# Patient Record
Sex: Female | Born: 1960 | Race: Black or African American | Hispanic: No | Marital: Single | Smoking: Current some day smoker
Health system: Southern US, Community
[De-identification: ages and names within clinical notes are randomized; demographics above are authoritative.]

## PROBLEM LIST (undated history)

## (undated) DIAGNOSIS — K219 Gastro-esophageal reflux disease without esophagitis: Secondary | ICD-10-CM

## (undated) DIAGNOSIS — B182 Chronic viral hepatitis C: Secondary | ICD-10-CM

## (undated) DIAGNOSIS — M1990 Unspecified osteoarthritis, unspecified site: Secondary | ICD-10-CM

## (undated) DIAGNOSIS — B1910 Unspecified viral hepatitis B without hepatic coma: Secondary | ICD-10-CM

## (undated) DIAGNOSIS — K029 Dental caries, unspecified: Secondary | ICD-10-CM

## (undated) DIAGNOSIS — R7611 Nonspecific reaction to tuberculin skin test without active tuberculosis: Secondary | ICD-10-CM

## (undated) DIAGNOSIS — I10 Essential (primary) hypertension: Secondary | ICD-10-CM

## (undated) DIAGNOSIS — L509 Urticaria, unspecified: Secondary | ICD-10-CM

## (undated) DIAGNOSIS — M2140 Flat foot [pes planus] (acquired), unspecified foot: Secondary | ICD-10-CM

## (undated) DIAGNOSIS — F1910 Other psychoactive substance abuse, uncomplicated: Secondary | ICD-10-CM

## (undated) DIAGNOSIS — Z720 Tobacco use: Secondary | ICD-10-CM

## (undated) DIAGNOSIS — M5127 Other intervertebral disc displacement, lumbosacral region: Secondary | ICD-10-CM

## (undated) DIAGNOSIS — R740 Nonspecific elevation of levels of transaminase and lactic acid dehydrogenase [LDH]: Secondary | ICD-10-CM

## (undated) DIAGNOSIS — A159 Respiratory tuberculosis unspecified: Secondary | ICD-10-CM

## (undated) DIAGNOSIS — Z8619 Personal history of other infectious and parasitic diseases: Secondary | ICD-10-CM

## (undated) DIAGNOSIS — N6029 Fibroadenosis of unspecified breast: Secondary | ICD-10-CM

## (undated) HISTORY — DX: Urticaria, unspecified: L50.9

## (undated) HISTORY — DX: Unspecified viral hepatitis B without hepatic coma: B19.10

## (undated) HISTORY — PX: BREAST EXCISIONAL BIOPSY: SUR124

## (undated) HISTORY — DX: Fibroadenosis of unspecified breast: N60.29

## (undated) HISTORY — DX: Gastro-esophageal reflux disease without esophagitis: K21.9

## (undated) HISTORY — DX: Nonspecific reaction to tuberculin skin test without active tuberculosis: R76.11

## (undated) HISTORY — DX: Chronic viral hepatitis C: B18.2

## (undated) HISTORY — DX: Flat foot (pes planus) (acquired), unspecified foot: M21.40

## (undated) HISTORY — DX: Nonspecific elevation of levels of transaminase and lactic acid dehydrogenase (ldh): R74.0

## (undated) HISTORY — DX: Essential (primary) hypertension: I10

## (undated) HISTORY — PX: FRACTURE SURGERY: SHX138

## (undated) HISTORY — DX: Other intervertebral disc displacement, lumbosacral region: M51.27

## (undated) HISTORY — PX: COLONOSCOPY: SHX174

## (undated) HISTORY — DX: Dental caries, unspecified: K02.9

## (undated) HISTORY — DX: Tobacco use: Z72.0

## (undated) HISTORY — DX: Unspecified osteoarthritis, unspecified site: M19.90

## (undated) HISTORY — DX: Other psychoactive substance abuse, uncomplicated: F19.10

## (undated) HISTORY — DX: Personal history of other infectious and parasitic diseases: Z86.19

---

## 1998-05-08 ENCOUNTER — Emergency Department (HOSPITAL_COMMUNITY): Admission: EM | Admit: 1998-05-08 | Discharge: 1998-05-08 | Payer: Self-pay | Admitting: Emergency Medicine

## 1998-08-08 HISTORY — PX: VAGINAL HYSTERECTOMY: SUR661

## 2000-08-12 ENCOUNTER — Emergency Department (HOSPITAL_COMMUNITY): Admission: EM | Admit: 2000-08-12 | Discharge: 2000-08-12 | Payer: Self-pay | Admitting: Emergency Medicine

## 2000-08-15 ENCOUNTER — Encounter: Admission: RE | Admit: 2000-08-15 | Discharge: 2000-08-15 | Payer: Self-pay | Admitting: Hematology and Oncology

## 2000-08-29 ENCOUNTER — Encounter: Admission: RE | Admit: 2000-08-29 | Discharge: 2000-08-29 | Payer: Self-pay | Admitting: Internal Medicine

## 2000-10-23 ENCOUNTER — Encounter: Admission: RE | Admit: 2000-10-23 | Discharge: 2000-10-23 | Payer: Self-pay

## 2000-10-24 ENCOUNTER — Encounter: Payer: Self-pay | Admitting: *Deleted

## 2000-10-24 ENCOUNTER — Ambulatory Visit (HOSPITAL_COMMUNITY): Admission: RE | Admit: 2000-10-24 | Discharge: 2000-10-24 | Payer: Self-pay | Admitting: *Deleted

## 2000-11-06 DIAGNOSIS — R7401 Elevation of levels of liver transaminase levels: Secondary | ICD-10-CM

## 2000-11-06 DIAGNOSIS — B1910 Unspecified viral hepatitis B without hepatic coma: Secondary | ICD-10-CM

## 2000-11-06 HISTORY — DX: Unspecified viral hepatitis B without hepatic coma: B19.10

## 2000-11-06 HISTORY — DX: Elevation of levels of liver transaminase levels: R74.01

## 2000-11-30 ENCOUNTER — Encounter: Admission: RE | Admit: 2000-11-30 | Discharge: 2000-11-30 | Payer: Self-pay | Admitting: Hematology and Oncology

## 2000-11-30 ENCOUNTER — Ambulatory Visit (HOSPITAL_COMMUNITY): Admission: RE | Admit: 2000-11-30 | Discharge: 2000-11-30 | Payer: Self-pay | Admitting: Hematology and Oncology

## 2000-11-30 ENCOUNTER — Encounter: Payer: Self-pay | Admitting: *Deleted

## 2000-11-30 ENCOUNTER — Ambulatory Visit (HOSPITAL_COMMUNITY): Admission: RE | Admit: 2000-11-30 | Discharge: 2000-11-30 | Payer: Self-pay | Admitting: *Deleted

## 2000-12-07 ENCOUNTER — Encounter: Admission: RE | Admit: 2000-12-07 | Discharge: 2000-12-07 | Payer: Self-pay | Admitting: Internal Medicine

## 2000-12-18 ENCOUNTER — Encounter: Admission: RE | Admit: 2000-12-18 | Discharge: 2000-12-18 | Payer: Self-pay | Admitting: Internal Medicine

## 2001-01-11 ENCOUNTER — Ambulatory Visit (HOSPITAL_COMMUNITY): Admission: RE | Admit: 2001-01-11 | Discharge: 2001-01-12 | Payer: Self-pay | Admitting: *Deleted

## 2001-01-11 ENCOUNTER — Encounter: Payer: Self-pay | Admitting: *Deleted

## 2001-01-11 HISTORY — PX: SPINE SURGERY: SHX786

## 2001-02-20 ENCOUNTER — Encounter: Admission: RE | Admit: 2001-02-20 | Discharge: 2001-05-21 | Payer: Self-pay | Admitting: *Deleted

## 2001-03-21 ENCOUNTER — Encounter: Admission: RE | Admit: 2001-03-21 | Discharge: 2001-03-21 | Payer: Self-pay | Admitting: Internal Medicine

## 2001-04-17 ENCOUNTER — Encounter: Admission: RE | Admit: 2001-04-17 | Discharge: 2001-04-17 | Payer: Self-pay | Admitting: Internal Medicine

## 2001-06-04 ENCOUNTER — Encounter: Admission: RE | Admit: 2001-06-04 | Discharge: 2001-06-04 | Payer: Self-pay

## 2001-07-24 ENCOUNTER — Encounter: Admission: RE | Admit: 2001-07-24 | Discharge: 2001-10-01 | Payer: Self-pay | Admitting: *Deleted

## 2001-10-29 ENCOUNTER — Encounter: Admission: RE | Admit: 2001-10-29 | Discharge: 2001-10-29 | Payer: Self-pay | Admitting: Internal Medicine

## 2002-01-15 ENCOUNTER — Encounter (INDEPENDENT_AMBULATORY_CARE_PROVIDER_SITE_OTHER): Payer: Self-pay | Admitting: Pulmonary Disease

## 2002-01-15 ENCOUNTER — Other Ambulatory Visit: Admission: RE | Admit: 2002-01-15 | Discharge: 2002-01-15 | Payer: Self-pay | Admitting: Internal Medicine

## 2002-01-15 ENCOUNTER — Encounter: Admission: RE | Admit: 2002-01-15 | Discharge: 2002-01-15 | Payer: Self-pay | Admitting: *Deleted

## 2002-01-15 LAB — CONVERTED CEMR LAB: Pap Smear: NORMAL

## 2002-01-22 ENCOUNTER — Encounter: Payer: Self-pay | Admitting: *Deleted

## 2002-01-22 ENCOUNTER — Encounter: Admission: RE | Admit: 2002-01-22 | Discharge: 2002-01-22 | Payer: Self-pay | Admitting: *Deleted

## 2002-02-16 ENCOUNTER — Emergency Department (HOSPITAL_COMMUNITY): Admission: EM | Admit: 2002-02-16 | Discharge: 2002-02-16 | Payer: Self-pay | Admitting: Emergency Medicine

## 2002-03-05 ENCOUNTER — Encounter: Admission: RE | Admit: 2002-03-05 | Discharge: 2002-03-05 | Payer: Self-pay | Admitting: Internal Medicine

## 2002-05-23 ENCOUNTER — Encounter: Admission: RE | Admit: 2002-05-23 | Discharge: 2002-05-23 | Payer: Self-pay | Admitting: Internal Medicine

## 2002-05-27 ENCOUNTER — Encounter: Admission: RE | Admit: 2002-05-27 | Discharge: 2002-05-27 | Payer: Self-pay | Admitting: Internal Medicine

## 2003-01-08 ENCOUNTER — Encounter: Payer: Self-pay | Admitting: *Deleted

## 2003-01-08 ENCOUNTER — Encounter: Admission: RE | Admit: 2003-01-08 | Discharge: 2003-01-08 | Payer: Self-pay | Admitting: *Deleted

## 2003-02-17 ENCOUNTER — Encounter: Admission: RE | Admit: 2003-02-17 | Discharge: 2003-02-17 | Payer: Self-pay | Admitting: *Deleted

## 2003-02-17 ENCOUNTER — Encounter: Payer: Self-pay | Admitting: *Deleted

## 2003-05-02 ENCOUNTER — Encounter: Admission: RE | Admit: 2003-05-02 | Discharge: 2003-05-02 | Payer: Self-pay | Admitting: Internal Medicine

## 2003-05-29 ENCOUNTER — Ambulatory Visit (HOSPITAL_COMMUNITY): Admission: RE | Admit: 2003-05-29 | Discharge: 2003-05-29 | Payer: Self-pay | Admitting: Internal Medicine

## 2003-05-29 ENCOUNTER — Encounter: Payer: Self-pay | Admitting: Internal Medicine

## 2003-10-16 ENCOUNTER — Encounter: Admission: RE | Admit: 2003-10-16 | Discharge: 2003-10-16 | Payer: Self-pay | Admitting: Internal Medicine

## 2004-05-10 ENCOUNTER — Ambulatory Visit (HOSPITAL_COMMUNITY): Admission: RE | Admit: 2004-05-10 | Discharge: 2004-05-10 | Payer: Self-pay | Admitting: Gastroenterology

## 2004-05-10 ENCOUNTER — Encounter (INDEPENDENT_AMBULATORY_CARE_PROVIDER_SITE_OTHER): Payer: Self-pay | Admitting: *Deleted

## 2004-06-16 ENCOUNTER — Ambulatory Visit (HOSPITAL_COMMUNITY): Admission: RE | Admit: 2004-06-16 | Discharge: 2004-06-16 | Payer: Self-pay | Admitting: Internal Medicine

## 2004-09-09 ENCOUNTER — Ambulatory Visit: Payer: Self-pay | Admitting: Gastroenterology

## 2004-09-14 ENCOUNTER — Ambulatory Visit: Payer: Self-pay | Admitting: Internal Medicine

## 2004-09-22 ENCOUNTER — Ambulatory Visit: Payer: Self-pay | Admitting: Sports Medicine

## 2004-11-09 ENCOUNTER — Ambulatory Visit: Payer: Self-pay | Admitting: Sports Medicine

## 2004-12-07 ENCOUNTER — Ambulatory Visit: Payer: Self-pay | Admitting: Sports Medicine

## 2004-12-27 ENCOUNTER — Emergency Department (HOSPITAL_COMMUNITY): Admission: EM | Admit: 2004-12-27 | Discharge: 2004-12-27 | Payer: Self-pay | Admitting: Family Medicine

## 2004-12-30 ENCOUNTER — Emergency Department (HOSPITAL_COMMUNITY): Admission: EM | Admit: 2004-12-30 | Discharge: 2004-12-30 | Payer: Self-pay | Admitting: Family Medicine

## 2005-01-04 ENCOUNTER — Ambulatory Visit: Payer: Self-pay | Admitting: Sports Medicine

## 2005-02-03 ENCOUNTER — Emergency Department (HOSPITAL_COMMUNITY): Admission: EM | Admit: 2005-02-03 | Discharge: 2005-02-04 | Payer: Self-pay | Admitting: Emergency Medicine

## 2005-02-11 ENCOUNTER — Ambulatory Visit: Payer: Self-pay | Admitting: Sports Medicine

## 2005-04-06 ENCOUNTER — Ambulatory Visit: Payer: Self-pay | Admitting: Internal Medicine

## 2005-04-06 ENCOUNTER — Encounter (INDEPENDENT_AMBULATORY_CARE_PROVIDER_SITE_OTHER): Payer: Self-pay | Admitting: Pulmonary Disease

## 2005-04-07 ENCOUNTER — Encounter (INDEPENDENT_AMBULATORY_CARE_PROVIDER_SITE_OTHER): Payer: Self-pay | Admitting: Pulmonary Disease

## 2005-04-14 ENCOUNTER — Ambulatory Visit: Payer: Self-pay | Admitting: Internal Medicine

## 2005-06-09 ENCOUNTER — Emergency Department (HOSPITAL_COMMUNITY): Admission: EM | Admit: 2005-06-09 | Discharge: 2005-06-09 | Payer: Self-pay | Admitting: Emergency Medicine

## 2005-08-30 ENCOUNTER — Ambulatory Visit: Payer: Self-pay | Admitting: Sports Medicine

## 2006-01-07 ENCOUNTER — Emergency Department (HOSPITAL_COMMUNITY): Admission: EM | Admit: 2006-01-07 | Discharge: 2006-01-07 | Payer: Self-pay | Admitting: Family Medicine

## 2006-02-14 ENCOUNTER — Ambulatory Visit: Payer: Self-pay | Admitting: Sports Medicine

## 2006-02-19 ENCOUNTER — Emergency Department (HOSPITAL_COMMUNITY): Admission: EM | Admit: 2006-02-19 | Discharge: 2006-02-19 | Payer: Self-pay | Admitting: Emergency Medicine

## 2006-02-27 ENCOUNTER — Emergency Department (HOSPITAL_COMMUNITY): Admission: EM | Admit: 2006-02-27 | Discharge: 2006-02-27 | Payer: Self-pay | Admitting: Emergency Medicine

## 2006-03-01 ENCOUNTER — Ambulatory Visit: Payer: Self-pay | Admitting: Hospitalist

## 2006-03-02 ENCOUNTER — Ambulatory Visit (HOSPITAL_COMMUNITY): Admission: RE | Admit: 2006-03-02 | Discharge: 2006-03-02 | Payer: Self-pay | Admitting: Internal Medicine

## 2006-03-17 ENCOUNTER — Ambulatory Visit: Payer: Self-pay | Admitting: Sports Medicine

## 2006-04-03 ENCOUNTER — Ambulatory Visit: Payer: Self-pay | Admitting: Internal Medicine

## 2006-04-08 ENCOUNTER — Emergency Department (HOSPITAL_COMMUNITY): Admission: EM | Admit: 2006-04-08 | Discharge: 2006-04-09 | Payer: Self-pay | Admitting: Emergency Medicine

## 2006-04-14 ENCOUNTER — Encounter: Admission: RE | Admit: 2006-04-14 | Discharge: 2006-05-04 | Payer: Self-pay | Admitting: Pulmonary Disease

## 2006-04-28 ENCOUNTER — Ambulatory Visit: Payer: Self-pay | Admitting: Sports Medicine

## 2006-05-11 ENCOUNTER — Ambulatory Visit: Payer: Self-pay | Admitting: Internal Medicine

## 2006-05-11 ENCOUNTER — Encounter (INDEPENDENT_AMBULATORY_CARE_PROVIDER_SITE_OTHER): Payer: Self-pay | Admitting: Pulmonary Disease

## 2006-05-12 ENCOUNTER — Encounter (INDEPENDENT_AMBULATORY_CARE_PROVIDER_SITE_OTHER): Payer: Self-pay | Admitting: Pulmonary Disease

## 2006-05-12 ENCOUNTER — Ambulatory Visit (HOSPITAL_COMMUNITY): Admission: RE | Admit: 2006-05-12 | Discharge: 2006-05-12 | Payer: Self-pay | Admitting: Internal Medicine

## 2006-05-15 ENCOUNTER — Encounter (INDEPENDENT_AMBULATORY_CARE_PROVIDER_SITE_OTHER): Payer: Self-pay | Admitting: Pulmonary Disease

## 2006-05-15 DIAGNOSIS — I10 Essential (primary) hypertension: Secondary | ICD-10-CM | POA: Insufficient documentation

## 2006-06-12 ENCOUNTER — Emergency Department (HOSPITAL_COMMUNITY): Admission: EM | Admit: 2006-06-12 | Discharge: 2006-06-12 | Payer: Self-pay | Admitting: Emergency Medicine

## 2006-08-16 ENCOUNTER — Ambulatory Visit: Payer: Self-pay | Admitting: Sports Medicine

## 2006-08-25 DIAGNOSIS — N6029 Fibroadenosis of unspecified breast: Secondary | ICD-10-CM | POA: Insufficient documentation

## 2006-08-25 DIAGNOSIS — B182 Chronic viral hepatitis C: Secondary | ICD-10-CM

## 2006-08-25 DIAGNOSIS — Z8619 Personal history of other infectious and parasitic diseases: Secondary | ICD-10-CM | POA: Insufficient documentation

## 2006-08-25 DIAGNOSIS — Z720 Tobacco use: Secondary | ICD-10-CM | POA: Insufficient documentation

## 2006-08-25 HISTORY — DX: Personal history of other infectious and parasitic diseases: Z86.19

## 2006-08-25 HISTORY — DX: Chronic viral hepatitis C: B18.2

## 2006-08-28 ENCOUNTER — Telehealth: Payer: Self-pay | Admitting: Internal Medicine

## 2006-08-29 ENCOUNTER — Telehealth (INDEPENDENT_AMBULATORY_CARE_PROVIDER_SITE_OTHER): Payer: Self-pay | Admitting: *Deleted

## 2006-09-14 ENCOUNTER — Telehealth (INDEPENDENT_AMBULATORY_CARE_PROVIDER_SITE_OTHER): Payer: Self-pay | Admitting: *Deleted

## 2006-09-14 ENCOUNTER — Ambulatory Visit: Payer: Self-pay | Admitting: Internal Medicine

## 2006-09-14 ENCOUNTER — Encounter (INDEPENDENT_AMBULATORY_CARE_PROVIDER_SITE_OTHER): Payer: Self-pay | Admitting: Pulmonary Disease

## 2006-09-14 LAB — CONVERTED CEMR LAB
CO2: 21 meq/L (ref 19–32)
Calcium: 9.2 mg/dL (ref 8.4–10.5)
Chloride: 103 meq/L (ref 96–112)
Sodium: 137 meq/L (ref 135–145)

## 2006-09-15 ENCOUNTER — Telehealth (INDEPENDENT_AMBULATORY_CARE_PROVIDER_SITE_OTHER): Payer: Self-pay | Admitting: *Deleted

## 2006-09-19 ENCOUNTER — Encounter (INDEPENDENT_AMBULATORY_CARE_PROVIDER_SITE_OTHER): Payer: Self-pay | Admitting: Pulmonary Disease

## 2006-09-20 ENCOUNTER — Ambulatory Visit: Payer: Self-pay | Admitting: Vascular Surgery

## 2006-09-20 ENCOUNTER — Ambulatory Visit (HOSPITAL_COMMUNITY): Admission: RE | Admit: 2006-09-20 | Discharge: 2006-09-20 | Payer: Self-pay | Admitting: Internal Medicine

## 2006-09-20 ENCOUNTER — Ambulatory Visit: Payer: Self-pay | Admitting: Hospitalist

## 2006-10-04 ENCOUNTER — Ambulatory Visit: Payer: Self-pay | Admitting: Sports Medicine

## 2006-10-14 ENCOUNTER — Emergency Department (HOSPITAL_COMMUNITY): Admission: EM | Admit: 2006-10-14 | Discharge: 2006-10-14 | Payer: Self-pay | Admitting: Emergency Medicine

## 2006-11-18 ENCOUNTER — Emergency Department (HOSPITAL_COMMUNITY): Admission: EM | Admit: 2006-11-18 | Discharge: 2006-11-18 | Payer: Self-pay | Admitting: Emergency Medicine

## 2006-12-31 ENCOUNTER — Emergency Department (HOSPITAL_COMMUNITY): Admission: EM | Admit: 2006-12-31 | Discharge: 2006-12-31 | Payer: Self-pay | Admitting: Family Medicine

## 2007-01-31 ENCOUNTER — Ambulatory Visit: Payer: Self-pay | Admitting: Internal Medicine

## 2007-01-31 DIAGNOSIS — K029 Dental caries, unspecified: Secondary | ICD-10-CM | POA: Insufficient documentation

## 2007-01-31 HISTORY — DX: Dental caries, unspecified: K02.9

## 2007-03-14 ENCOUNTER — Ambulatory Visit: Payer: Self-pay | Admitting: Sports Medicine

## 2007-03-28 ENCOUNTER — Encounter: Payer: Self-pay | Admitting: Sports Medicine

## 2007-05-14 ENCOUNTER — Ambulatory Visit (HOSPITAL_COMMUNITY): Admission: RE | Admit: 2007-05-14 | Discharge: 2007-05-14 | Payer: Self-pay | Admitting: Pulmonary Disease

## 2007-05-16 ENCOUNTER — Telehealth: Payer: Self-pay | Admitting: *Deleted

## 2007-05-22 ENCOUNTER — Emergency Department (HOSPITAL_COMMUNITY): Admission: EM | Admit: 2007-05-22 | Discharge: 2007-05-22 | Payer: Self-pay | Admitting: Emergency Medicine

## 2007-05-25 ENCOUNTER — Ambulatory Visit: Payer: Self-pay | Admitting: *Deleted

## 2007-07-10 ENCOUNTER — Ambulatory Visit: Payer: Self-pay | Admitting: Sports Medicine

## 2007-07-10 DIAGNOSIS — M2140 Flat foot [pes planus] (acquired), unspecified foot: Secondary | ICD-10-CM | POA: Insufficient documentation

## 2007-07-27 ENCOUNTER — Encounter (INDEPENDENT_AMBULATORY_CARE_PROVIDER_SITE_OTHER): Payer: Self-pay | Admitting: *Deleted

## 2007-07-27 ENCOUNTER — Ambulatory Visit: Payer: Self-pay | Admitting: Hospitalist

## 2007-08-01 DIAGNOSIS — R740 Nonspecific elevation of levels of transaminase and lactic acid dehydrogenase [LDH]: Secondary | ICD-10-CM

## 2007-08-01 LAB — CONVERTED CEMR LAB
AST: 198 units/L — ABNORMAL HIGH (ref 0–37)
Alkaline Phosphatase: 54 units/L (ref 39–117)
BUN: 7 mg/dL (ref 6–23)
Creatinine, Ser: 0.7 mg/dL (ref 0.40–1.20)
Total Bilirubin: 0.3 mg/dL (ref 0.3–1.2)

## 2007-09-26 ENCOUNTER — Ambulatory Visit: Payer: Self-pay | Admitting: Internal Medicine

## 2007-11-28 ENCOUNTER — Ambulatory Visit: Payer: Self-pay | Admitting: Hospitalist

## 2007-11-28 ENCOUNTER — Ambulatory Visit (HOSPITAL_COMMUNITY): Admission: RE | Admit: 2007-11-28 | Discharge: 2007-11-28 | Payer: Self-pay | Admitting: Hospitalist

## 2007-12-05 ENCOUNTER — Telehealth (INDEPENDENT_AMBULATORY_CARE_PROVIDER_SITE_OTHER): Payer: Self-pay | Admitting: Internal Medicine

## 2008-04-16 ENCOUNTER — Ambulatory Visit: Payer: Self-pay | Admitting: Internal Medicine

## 2008-04-16 ENCOUNTER — Encounter (INDEPENDENT_AMBULATORY_CARE_PROVIDER_SITE_OTHER): Payer: Self-pay | Admitting: *Deleted

## 2008-04-17 ENCOUNTER — Encounter (INDEPENDENT_AMBULATORY_CARE_PROVIDER_SITE_OTHER): Payer: Self-pay | Admitting: *Deleted

## 2008-04-21 LAB — CONVERTED CEMR LAB
Candida species: POSITIVE — AB
Trichomonal Vaginitis: NEGATIVE

## 2008-04-23 ENCOUNTER — Ambulatory Visit: Payer: Self-pay | Admitting: Internal Medicine

## 2008-05-07 ENCOUNTER — Emergency Department (HOSPITAL_COMMUNITY): Admission: EM | Admit: 2008-05-07 | Discharge: 2008-05-07 | Payer: Self-pay | Admitting: Emergency Medicine

## 2008-05-19 ENCOUNTER — Ambulatory Visit (HOSPITAL_COMMUNITY): Admission: RE | Admit: 2008-05-19 | Discharge: 2008-05-19 | Payer: Self-pay | Admitting: Internal Medicine

## 2008-06-04 ENCOUNTER — Emergency Department (HOSPITAL_BASED_OUTPATIENT_CLINIC_OR_DEPARTMENT_OTHER): Admission: EM | Admit: 2008-06-04 | Discharge: 2008-06-04 | Payer: Self-pay | Admitting: Emergency Medicine

## 2008-07-14 IMAGING — CT CT CHEST W/ CM
2 of 3 series · 15 of 36 positions shown, 18 images · IV contrast (omnipaque)
Comparison: none

CLINICAL DATA: Back pain and shortness of breath. 
CHEST CT WITH CONTRAST ? 02/27/06:
TECHNIQUE: Multidetector CT imaging of the chest was performed following the standard protocol during bolus administration of intravenous contrast.
Contrast:  100 cc Omnipaque 300 IV.

[Series 2: routine chest · axial · 0.62mm/px · z∈[-297,-42]mm · 12 of 61 slices shown, 15 images]
[im 5/61  mediastinal]
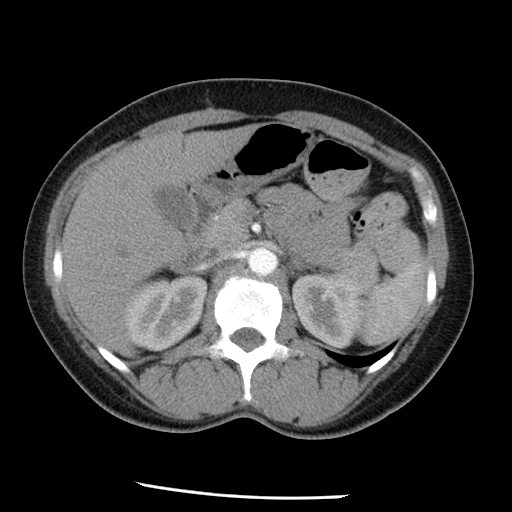
[im 5/61  lung]
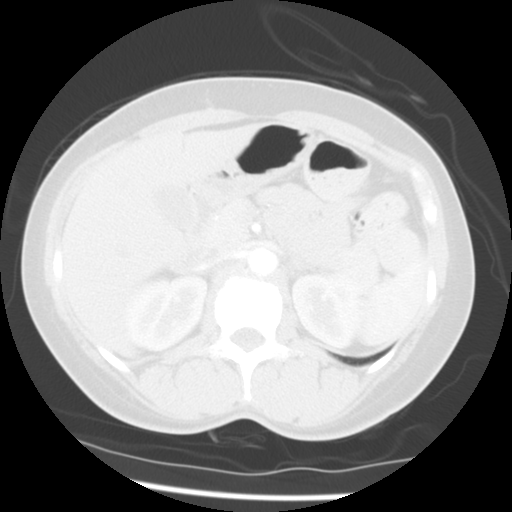
[im 9/61  lung]
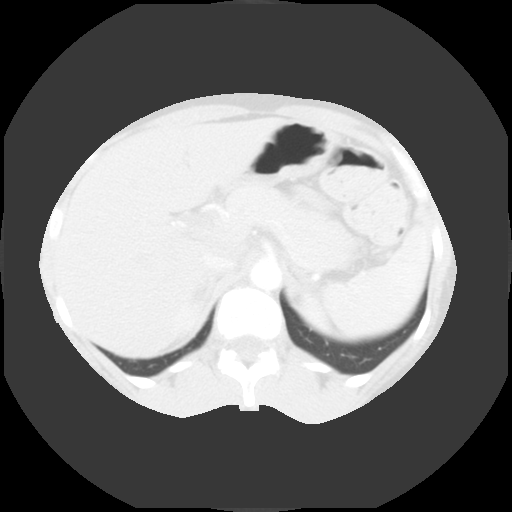
[im 14/61  lung]
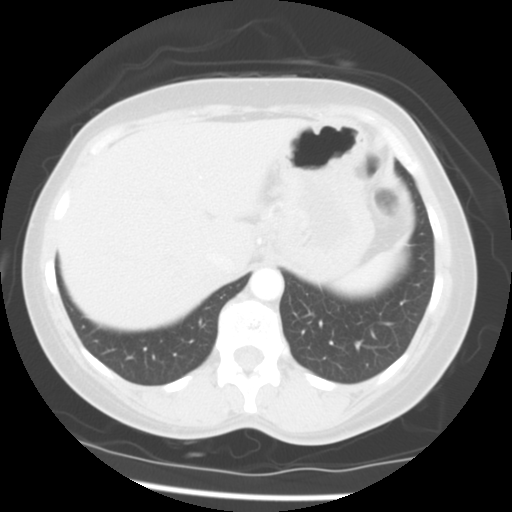
[im 18/61  lung]
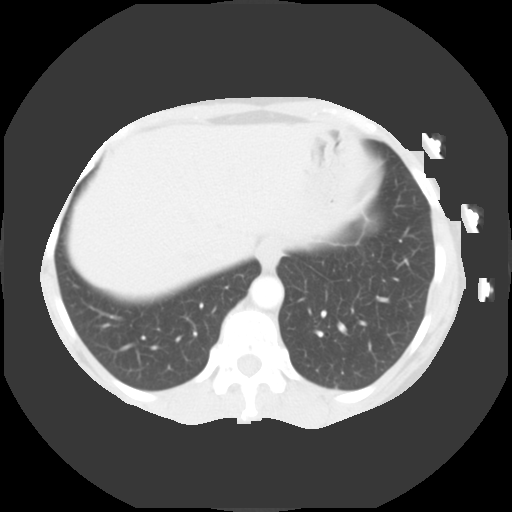
[im 23/61  mediastinal]
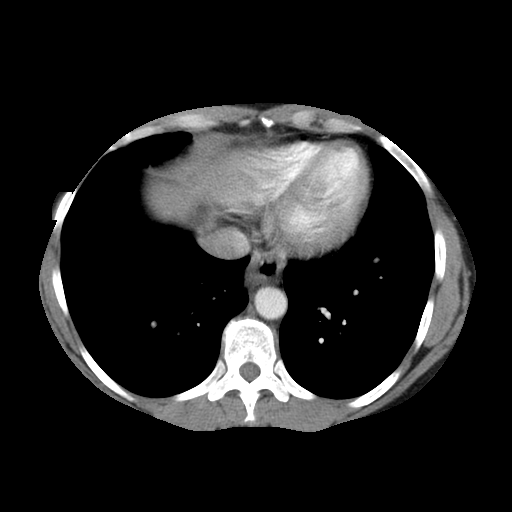
[im 23/61  lung]
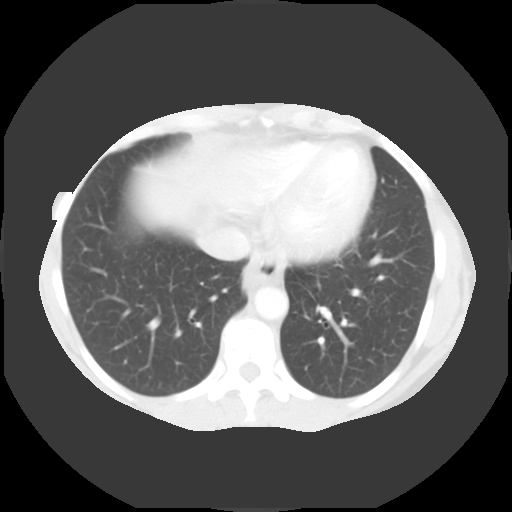
[im 27/61  lung]
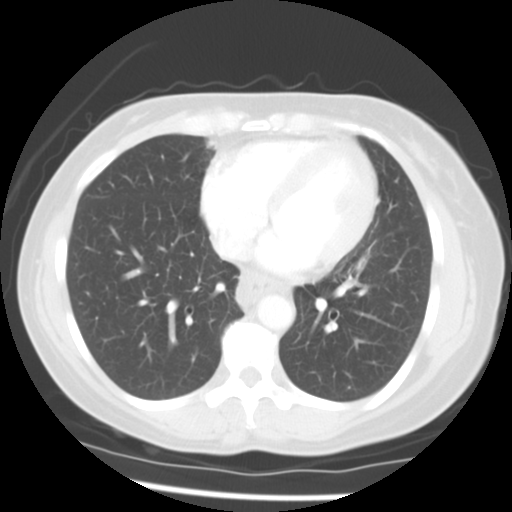
[im 34/61  lung]
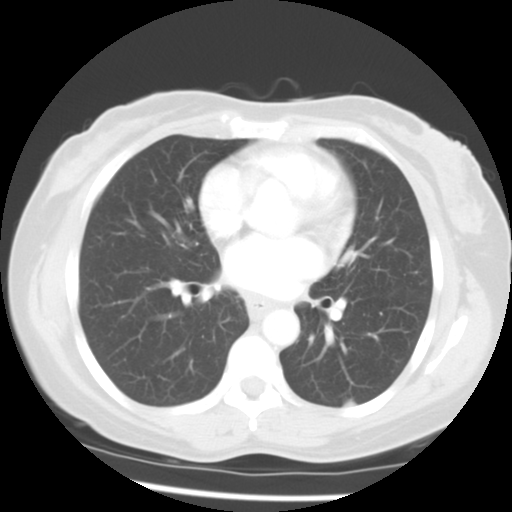
[im 38/61  lung]
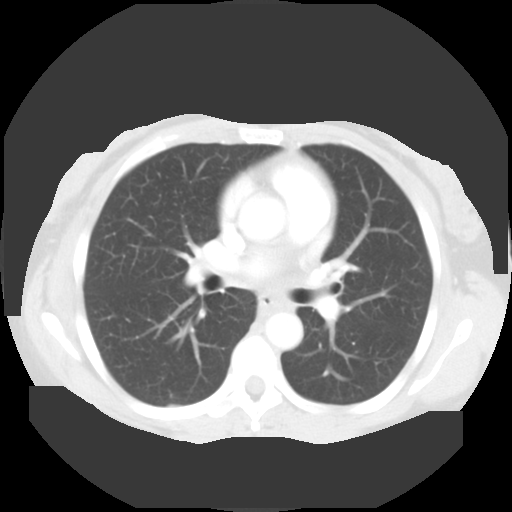
[im 43/61  mediastinal]
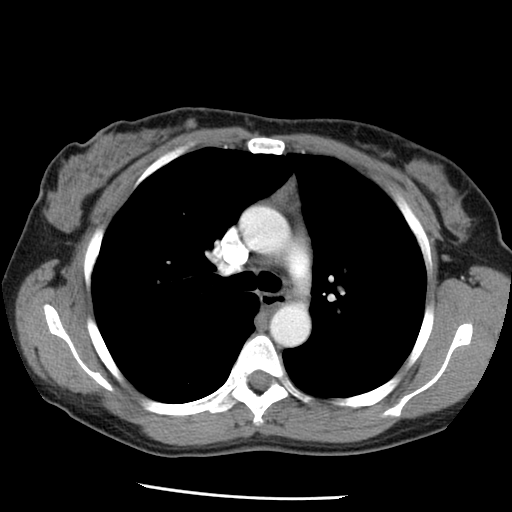
[im 43/61  lung]
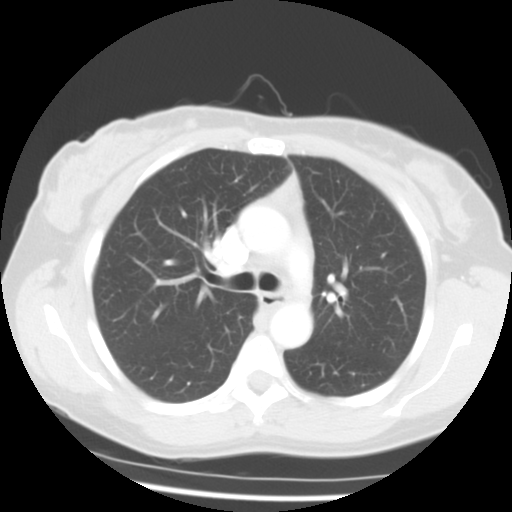
[im 47/61  lung]
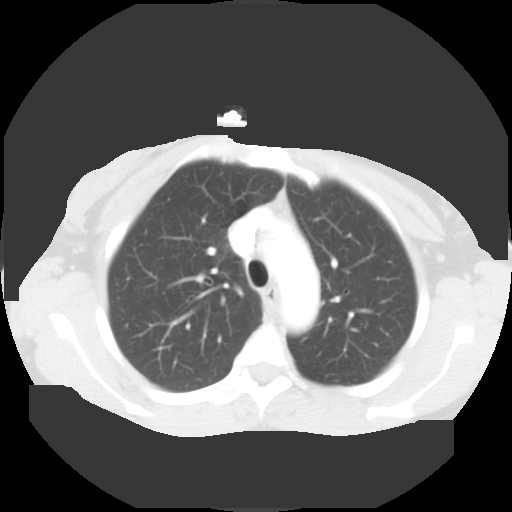
[im 52/61  lung]
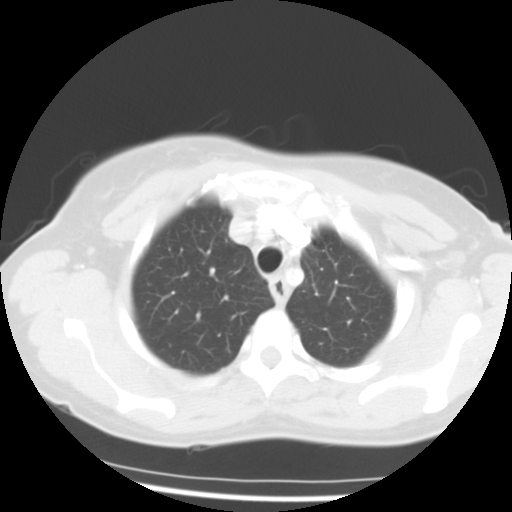
[im 56/61  lung]
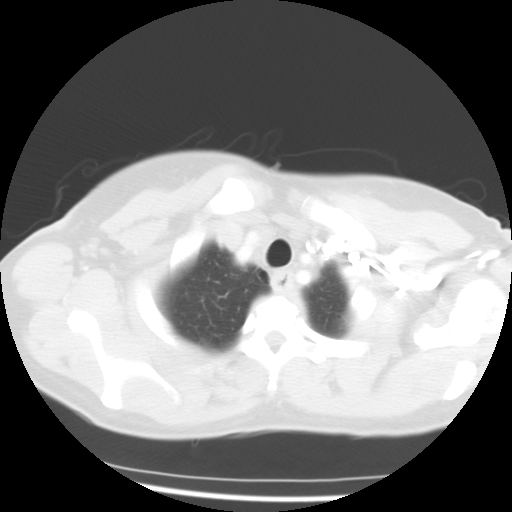

[Series 401: reformatted · coronal · 0.62mm/px · 3 of 118 slices shown]
[im 24/118  lung]
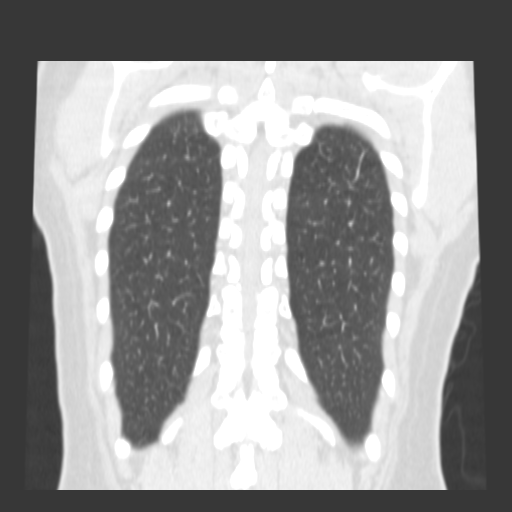
[im 47/118  lung]
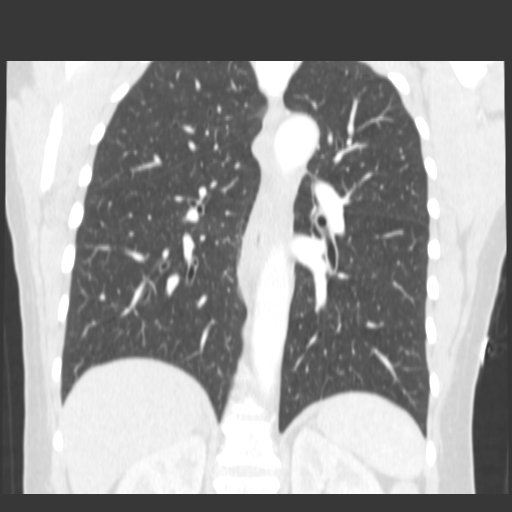
[im 71/118  lung]
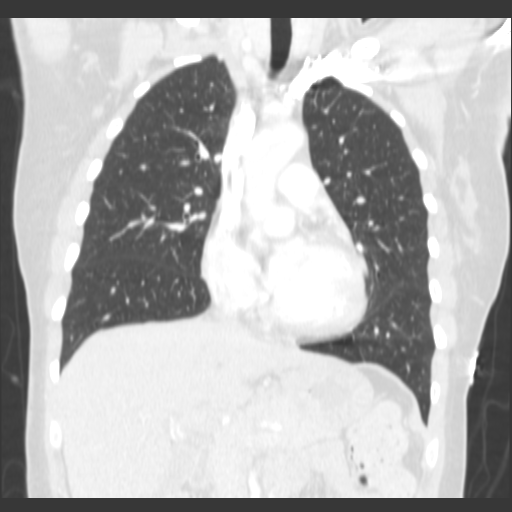

[15 of 36 positions shown; findings below may reference images not displayed]

FINDINGS: There are some small pleural plaques and subpleural areas of nodularity bilaterally within the posterior hemithoraces.  These have a benign appearance and represent either noncalcified pleural plaques, subpleural lymph nodes or areas of scarring.  It may be worthwhile to obtain at least one follow-up CT scan in 6-12 months.  No evidence of pulmonary masses.  No infiltrates or edema.  No adenopathy is seen.  The aorta and pulmonary arteries are normal in appearance bilaterally.
IMPRESSION: Small areas of bilateral posterior pleural/subpleural nodularity likely representing noncalcified plaques.  Some of these may also represent small subpleural lymph nodes.  It is felt worthwhile to obtain a follow-up CT scan in 6-12 months.

## 2008-07-14 IMAGING — CR DG CHEST 1V PORT
1 series · 1 of 1 positions shown · non-contrast
Comparison: 12/30/04.

CLINICAL DATA: Chest pain and slight shortness of breath.
 PORTABLE CHEST - 1 VIEW - 02/27/06:

[view not recorded]
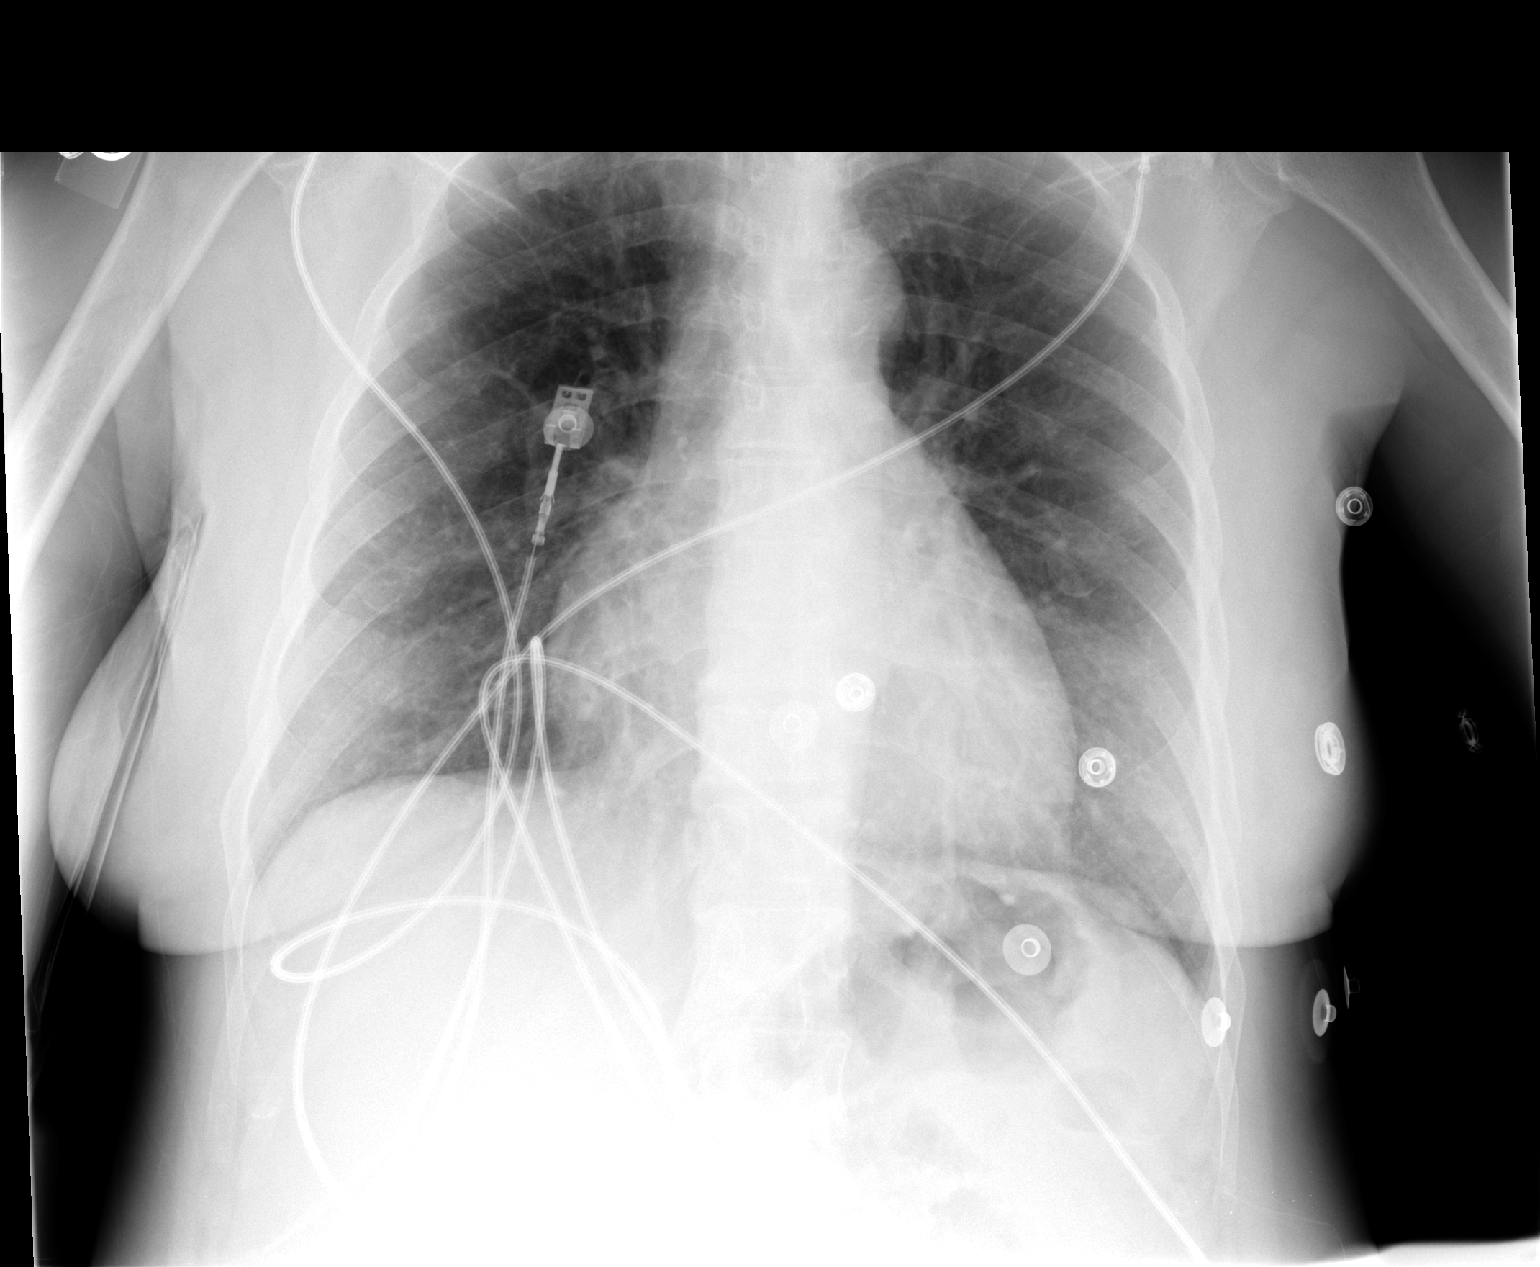

[1 of 1 positions shown; findings below may reference images not displayed]

FINDINGS: The heart size and vascularity are normal and the lungs are clear.  No significant bony abnormality.
IMPRESSION: No acute disease.

## 2008-08-11 ENCOUNTER — Ambulatory Visit: Payer: Self-pay | Admitting: Internal Medicine

## 2008-08-14 ENCOUNTER — Ambulatory Visit: Payer: Self-pay | Admitting: Sports Medicine

## 2008-08-26 ENCOUNTER — Telehealth (INDEPENDENT_AMBULATORY_CARE_PROVIDER_SITE_OTHER): Payer: Self-pay | Admitting: *Deleted

## 2008-12-18 ENCOUNTER — Ambulatory Visit: Payer: Self-pay | Admitting: Internal Medicine

## 2008-12-18 ENCOUNTER — Encounter (INDEPENDENT_AMBULATORY_CARE_PROVIDER_SITE_OTHER): Payer: Self-pay | Admitting: *Deleted

## 2008-12-19 LAB — CONVERTED CEMR LAB
AST: 208 units/L — ABNORMAL HIGH (ref 0–37)
Albumin: 3.9 g/dL (ref 3.5–5.2)
Alkaline Phosphatase: 56 units/L (ref 39–117)
BUN: 7 mg/dL (ref 6–23)
Bacteria, UA: NONE SEEN
HCT: 45.4 % (ref 36.0–46.0)
Hemoglobin, Urine: NEGATIVE
MCV: 94.2 fL (ref 78.0–100.0)
Nitrite: NEGATIVE
Platelets: 309 10*3/uL (ref 150–400)
Potassium: 3.8 meq/L (ref 3.5–5.3)
RBC / HPF: NONE SEEN (ref <3)
Sodium: 138 meq/L (ref 135–145)
Total Protein: 7.8 g/dL (ref 6.0–8.3)
Urine Glucose: NEGATIVE mg/dL
WBC: 6.4 10*3/uL (ref 4.0–10.5)
pH: 6 (ref 5.0–8.0)

## 2008-12-24 ENCOUNTER — Ambulatory Visit (HOSPITAL_COMMUNITY): Admission: RE | Admit: 2008-12-24 | Discharge: 2008-12-24 | Payer: Self-pay | Admitting: Internal Medicine

## 2008-12-29 ENCOUNTER — Ambulatory Visit: Payer: Self-pay | Admitting: Sports Medicine

## 2008-12-29 DIAGNOSIS — M545 Low back pain: Secondary | ICD-10-CM | POA: Insufficient documentation

## 2009-01-02 ENCOUNTER — Emergency Department (HOSPITAL_COMMUNITY): Admission: EM | Admit: 2009-01-02 | Discharge: 2009-01-02 | Payer: Self-pay | Admitting: Emergency Medicine

## 2009-01-06 ENCOUNTER — Ambulatory Visit: Payer: Self-pay | Admitting: Infectious Disease

## 2009-01-23 ENCOUNTER — Emergency Department (HOSPITAL_COMMUNITY): Admission: EM | Admit: 2009-01-23 | Discharge: 2009-01-23 | Payer: Self-pay | Admitting: Emergency Medicine

## 2009-01-27 ENCOUNTER — Encounter: Payer: Self-pay | Admitting: Licensed Clinical Social Worker

## 2009-01-27 ENCOUNTER — Ambulatory Visit: Payer: Self-pay | Admitting: Internal Medicine

## 2009-01-27 ENCOUNTER — Encounter (INDEPENDENT_AMBULATORY_CARE_PROVIDER_SITE_OTHER): Payer: Self-pay | Admitting: Internal Medicine

## 2009-01-28 ENCOUNTER — Telehealth (INDEPENDENT_AMBULATORY_CARE_PROVIDER_SITE_OTHER): Payer: Self-pay | Admitting: Internal Medicine

## 2009-01-28 ENCOUNTER — Encounter (INDEPENDENT_AMBULATORY_CARE_PROVIDER_SITE_OTHER): Payer: Self-pay | Admitting: Internal Medicine

## 2009-01-28 LAB — CONVERTED CEMR LAB
Candida species: NEGATIVE
Gardnerella vaginalis: NEGATIVE
Hemoglobin, Urine: NEGATIVE
LDL Cholesterol: 88 mg/dL (ref 0–99)
Nitrite: NEGATIVE
Trichomonal Vaginitis: NEGATIVE
VLDL: 34 mg/dL (ref 0–40)

## 2009-02-04 ENCOUNTER — Ambulatory Visit: Payer: Self-pay | Admitting: Internal Medicine

## 2009-02-12 ENCOUNTER — Ambulatory Visit: Payer: Self-pay | Admitting: Internal Medicine

## 2009-03-13 ENCOUNTER — Telehealth: Payer: Self-pay | Admitting: *Deleted

## 2009-03-17 ENCOUNTER — Encounter: Payer: Self-pay | Admitting: Licensed Clinical Social Worker

## 2009-03-17 ENCOUNTER — Ambulatory Visit: Payer: Self-pay | Admitting: Internal Medicine

## 2009-03-17 DIAGNOSIS — K120 Recurrent oral aphthae: Secondary | ICD-10-CM | POA: Insufficient documentation

## 2009-03-23 ENCOUNTER — Ambulatory Visit: Payer: Self-pay | Admitting: Internal Medicine

## 2009-04-06 ENCOUNTER — Ambulatory Visit: Payer: Self-pay | Admitting: Internal Medicine

## 2009-04-06 LAB — HM COLONOSCOPY: HM Colonoscopy: NORMAL

## 2009-04-22 ENCOUNTER — Ambulatory Visit: Payer: Self-pay | Admitting: Infectious Diseases

## 2009-05-20 ENCOUNTER — Ambulatory Visit (HOSPITAL_COMMUNITY): Admission: RE | Admit: 2009-05-20 | Discharge: 2009-05-20 | Payer: Self-pay | Admitting: Internal Medicine

## 2009-05-26 ENCOUNTER — Emergency Department (HOSPITAL_COMMUNITY): Admission: EM | Admit: 2009-05-26 | Discharge: 2009-05-26 | Payer: Self-pay | Admitting: Emergency Medicine

## 2009-06-01 ENCOUNTER — Ambulatory Visit: Payer: Self-pay | Admitting: Internal Medicine

## 2009-06-29 ENCOUNTER — Ambulatory Visit: Payer: Self-pay | Admitting: Infectious Disease

## 2009-09-01 ENCOUNTER — Ambulatory Visit: Payer: Self-pay | Admitting: Internal Medicine

## 2009-09-01 LAB — CONVERTED CEMR LAB: Pap Smear: NEGATIVE

## 2009-09-02 ENCOUNTER — Encounter: Payer: Self-pay | Admitting: Internal Medicine

## 2009-09-21 ENCOUNTER — Ambulatory Visit: Payer: Self-pay | Admitting: Sports Medicine

## 2009-12-11 ENCOUNTER — Ambulatory Visit: Payer: Self-pay | Admitting: Sports Medicine

## 2009-12-30 ENCOUNTER — Encounter: Payer: Self-pay | Admitting: Internal Medicine

## 2010-01-11 ENCOUNTER — Encounter: Payer: Self-pay | Admitting: Licensed Clinical Social Worker

## 2010-01-11 ENCOUNTER — Ambulatory Visit: Payer: Self-pay | Admitting: Internal Medicine

## 2010-01-11 LAB — CONVERTED CEMR LAB
ALT: 43 units/L — ABNORMAL HIGH (ref 0–35)
Albumin: 4 g/dL (ref 3.5–5.2)
CO2: 20 meq/L (ref 19–32)
Chloride: 105 meq/L (ref 96–112)
Potassium: 4.4 meq/L (ref 3.5–5.3)
Sodium: 139 meq/L (ref 135–145)
Total Bilirubin: 0.5 mg/dL (ref 0.3–1.2)
Total Protein: 7.9 g/dL (ref 6.0–8.3)

## 2010-01-12 ENCOUNTER — Encounter: Payer: Self-pay | Admitting: Family Medicine

## 2010-01-12 ENCOUNTER — Encounter: Admission: RE | Admit: 2010-01-12 | Discharge: 2010-02-04 | Payer: Self-pay | Admitting: Family Medicine

## 2010-02-09 ENCOUNTER — Encounter: Payer: Self-pay | Admitting: Sports Medicine

## 2010-04-22 ENCOUNTER — Ambulatory Visit: Payer: Self-pay | Admitting: Sports Medicine

## 2010-04-28 ENCOUNTER — Ambulatory Visit: Payer: Self-pay | Admitting: Internal Medicine

## 2010-05-21 ENCOUNTER — Ambulatory Visit (HOSPITAL_COMMUNITY): Admission: RE | Admit: 2010-05-21 | Discharge: 2010-05-21 | Payer: Self-pay | Admitting: Internal Medicine

## 2010-06-25 ENCOUNTER — Emergency Department (HOSPITAL_COMMUNITY)
Admission: EM | Admit: 2010-06-25 | Discharge: 2010-06-25 | Payer: Self-pay | Source: Home / Self Care | Admitting: Emergency Medicine

## 2010-07-14 ENCOUNTER — Ambulatory Visit: Payer: Self-pay | Admitting: Internal Medicine

## 2010-07-14 DIAGNOSIS — IMO0001 Reserved for inherently not codable concepts without codable children: Secondary | ICD-10-CM | POA: Insufficient documentation

## 2010-07-19 ENCOUNTER — Telehealth: Payer: Self-pay | Admitting: Internal Medicine

## 2010-07-24 ENCOUNTER — Emergency Department (HOSPITAL_COMMUNITY)
Admission: EM | Admit: 2010-07-24 | Discharge: 2010-07-24 | Payer: Self-pay | Source: Home / Self Care | Admitting: Emergency Medicine

## 2010-08-05 ENCOUNTER — Emergency Department (HOSPITAL_COMMUNITY)
Admission: EM | Admit: 2010-08-05 | Discharge: 2010-08-05 | Payer: Self-pay | Source: Home / Self Care | Admitting: Emergency Medicine

## 2010-09-07 NOTE — Assessment & Plan Note (Signed)
Summary: L LEG/BACK PAIN   Vital Signs:  Patient profile:   50 year old female Height:      63 inches (160.02 cm) Weight:      150 pounds (68.18 kg) BMI:     26.67 BP sitting:   143 / 86  Vitals Entered By: Kathi Simpers [facility]) (April 22, 2010 11:52 AM)  Primary Provider:  Jackson Latino MD   History of Present Illness: Janell cont to have chronic LBP this radiates down into left leg PT did help quite a bit and she is trying to do some exercises from that has run out of klonopin and amitriptyline this has let the pain increase again note she does feel that these meds take a lot of the edge off more severe pain no inc in weakness  Allergies (verified): No Known Drug Allergies  Physical Exam  General:  Well-developed,well-nourished,in no acute distress; alert,appropriate and cooperative throughout examination Msk:  SLR on RT is neg SLR left shows some pain at 45 deg TTP over mid buttocks/ upper thigh and calf left less so on RT  walking heel, toe without weakness  some weakness on repeated step up on 8 in step with left leg   Impression & Recommendations:  Problem # 1:  LOW BACK PAIN, CHRONIC (ICD-724.2)  Her updated medication list for this problem includes:    Tylenol 8 Hour 650 Mg Cr-tabs (Acetaminophen) .Marland Kitchen... Take 1 tablet by mouth three times a day as needed for pain.   renewed amitriptyline and klonopin would keep these as her chronic meds cont tylenol as well keep up exercises   will keep using sports insoles as teh cushion has helped her pain reck here in 4 mos  Orders: Sports Insoles ([id number])  Problem # 2:  BACK PAIN, LUMBAR, WITH RADICULOPATHY (ICD-724.4)  Her updated medication list for this problem includes:    Tylenol 8 Hour 650 Mg Cr-tabs (Acetaminophen) .Marland Kitchen... Take 1 tablet by mouth three times a day as needed for pain.  Complete Medication List: 1)  Lisinopril 40 Mg Tabs (Lisinopril) .... Take 1 tablet by mouth once a day 2)   Hydrochlorothiazide 25 Mg Tabs (Hydrochlorothiazide) .... Take 1 tablet by mouth once a day 3)  Amitriptyline Hcl 100 Mg Tabs (Amitriptyline hcl) .Marland Kitchen.. 1 by mouth qhs 4)  First-mouthwash Blm Susp (Dph-lido-alhydr-mghydr-simeth) .... 5 cc swish and swallow three times a day 5)  Tylenol 8 Hour 650 Mg Cr-tabs (Acetaminophen) .... Take 1 tablet by mouth three times a day as needed for pain. 6)  Cvs Nicotine 7 Mg/24hr Pt24 (Nicotine) .... One patch a day per instruction. 7)  Klonopin 0.5 Mg Tabs (Clonazepam) .Marland Kitchen.. 1 by mouth qhs Prescriptions: KLONOPIN 0.5 MG TABS (CLONAZEPAM) 1 by mouth qhs  #30 x 5   Entered by:   Lillia Pauls CMA   Authorized by:   Enid Baas MD   Signed by:   Lillia Pauls CMA on 04/22/2010   Method used:   Print then Give to Patient   RxID:   [id number] AMITRIPTYLINE HCL 100 MG TABS (AMITRIPTYLINE HCL) 1 by mouth qhs  #30 x 5   Entered by:   Lillia Pauls CMA   Authorized by:   Enid Baas MD   Signed by:   Lillia Pauls CMA on 04/22/2010   Method used:   Print then Give to Patient   RxID:   [id number] KLONOPIN 0.5 MG TABS (CLONAZEPAM) 1 by mouth qhs  #30 x 5   Entered and Authorized  by:   Enid Baas MD   Signed by:   Enid Baas MD on 04/22/2010   Method used:   Print then Give to Patient   RxID:   [id number] AMITRIPTYLINE HCL 100 MG TABS (AMITRIPTYLINE HCL) 1 by mouth qhs  #30 x 5   Entered and Authorized by:   Enid Baas MD   Signed by:   Enid Baas MD on 04/22/2010   Method used:   Print then Give to Patient   RxID:   [id number]

## 2010-09-07 NOTE — Miscellaneous (Signed)
Summary: [facility]  [facility]   Imported By: Marily Memos 02/16/2010 11:48:26  _____________________________________________________________________  External Attachment:    Type:   Image     Comment:   External Document

## 2010-09-07 NOTE — Assessment & Plan Note (Signed)
Summary: Soc. Work  Soc Work.  15 minutes.  Met with patient in exam room.  She has disability paperwork dated May 25th that she is questioning whether she needs to turn in because she is past the 10 day timeframe. I read through the paperwork and encouraged her to complete and either fax or mail in.  I offered to help her complete the questionnaire since she has reading difficulties but she tells me her daughter will help her complete today and mail.  I gave her my card for any other questions or concerns that may arise.

## 2010-09-07 NOTE — Assessment & Plan Note (Signed)
Summary: LEG/BACK PAIN,MC   Vital Signs:  Patient profile:   50 year old female BP sitting:   143 / 89  Vitals Entered By: Lillia Pauls CMA (September 21, 2009 9:37 AM)  Primary Provider:  Jackson Latino MD   History of Present Illness: 50 y/o AAF here for F/U of Left leg pain and back pain. Patient last visit was May 2010.  Pt has a history of herniated disc at L5-S1.  Pt complains of constant posterior left leg pain and left lumbar back pain that has not improved with  amytriptiline and tylenol.  Pt states that feels like her leg gives out on her at times while walking, thinks that left leg is weaker.  Pain is felt as a numbness pain that goes from lumbar back down to her toes on the left side.  Pt states that stairs, bending over, picking-up objects and laying on the left side at night makes the pain worse.  Pt also gets muscle spasms in her left leg at night.   Allergies: No Known Drug Allergies  Physical Exam  General:  alert and uncomfortable-appearing.   Msk:  Back: -Tenderness of paraspinal muscles on Left and midline spine -Pain radiating to foot on flexion and extension  Bilateral Foot/Ankle/Leg: -No edema -Tender to palpation of muscles on posterior aspect of thigh, calves and foot on Left side. -FROM -5/5 strength bilaterally  -negative passive and crossover leg raise at 45 degrees -Figure four of bilateral legs had tight SI joints.  able to do toe, heel and tandem walk no obvious limp   Impression & Recommendations:  Problem # 1:  BACK PAIN, LUMBAR, WITH RADICULOPATHY (ICD-724.4)  Her updated medication list for this problem includes:    Tylenol 8 Hour 650 Mg Cr-tabs (Acetaminophen) .Marland Kitchen... Take 1 tablet by mouth three times a day as needed for pain.  would keep up amitriptyline add klonopin 0.5 mgm just at hs to lessen cramping and spasm add nite avoid narcotics with hx of alcohol use  not much we have to add to help her beyond encouraging motion exercises  for backs, hips cont on meds as noted  will return to [facility] care  Complete Medication List: 1)  Lisinopril 40 Mg Tabs (Lisinopril) .... Take 1 tablet by mouth once a day 2)  Hydrochlorothiazide 25 Mg Tabs (Hydrochlorothiazide) .... Take 1 tablet by mouth once a day 3)  Amitriptyline Hcl 100 Mg Tabs (Amitriptyline hcl) .Marland Kitchen.. 1 by mouth qhs 4)  First-mouthwash Blm Susp (Dph-lido-alhydr-mghydr-simeth) .... 5 cc swish and swallow three times a day 5)  Tylenol 8 Hour 650 Mg Cr-tabs (Acetaminophen) .... Take 1 tablet by mouth three times a day as needed for pain. 6)  Cvs Nicotine 7 Mg/24hr Pt24 (Nicotine) .... One patch a day per instruction. 7)  Klonopin 0.5 Mg Tabs (Clonazepam) .Marland Kitchen.. 1 by mouth qhs Prescriptions: KLONOPIN 0.5 MG TABS (CLONAZEPAM) 1 by mouth qhs  #30 x 1   Entered and Authorized by:   Enid Baas MD   Signed by:   Enid Baas MD on 09/21/2009   Method used:   Print then Give to Patient   RxID:   [id number]
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
## 2010-09-07 NOTE — Assessment & Plan Note (Signed)
Summary: flu shot/cfb  Nurse Visit   Allergies: No Known Drug Allergies  Immunizations Administered:  Influenza Vaccine # 1:    Vaccine Type: Fluvax Non-MCR    Site: left deltoid    Mfr: [employer]oSmithK[employer]    Dose: 0.5 ml    Route: IM    Given by: Anne Ng,, RN    Exp. Date: 02/05/2011    Lot #: [id number]    VIS given: 03/02/10 version given April 28, 2010.  Flu Vaccine Consent Questions:    Do you have a history of severe allergic reactions to this vaccine? no    Any prior history of allergic reactions to egg and/or gelatin? no    Do you have a sensitivity to the preservative Thimersol? no    Do you have a past history of Guillan-Barre Syndrome? no    Do you currently have an acute febrile illness? no    Have you ever had a severe reaction to latex? no    Vaccine information given and explained to patient? yes    Are you currently pregnant? no  Orders Added: 1)  Influenza Vaccine NON MCR [00028]
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
## 2010-09-07 NOTE — Miscellaneous (Signed)
Summary: [facility]  [facility]   Imported By: Margie Billet 01/19/2010 14:43:14  _____________________________________________________________________  External Attachment:    Type:   Image     Comment:   External Document

## 2010-09-07 NOTE — Assessment & Plan Note (Signed)
Summary: BACK/LEG PAIN,MC   Vital Signs:  Patient profile:   50 year old female BP sitting:   120 / 82  Primary Provider:  Jackson Latino MD   History of Present Illness: 50 y/o AAF here for F/U of Left leg pain and back pain.   Patient with known h/o disc herniation at L5-S1. Pain starts in low left back and buttock and radiates down left leg. Associated with numbness No bowel/bladder dysfunction/incontinence. Is currently taking amitriptyline 100mg  at bedtime which helps her sleep but not helping with pain much. States she is doing exercises but she demonstrated knee flexion and back twisting exercises which were not ones we showed her.  Not doing others per patient. Interested in trying physical therapy again and does not want surgery. Pain worse with all movements + night pain  Allergies (verified): No Known Drug Allergies  Physical Exam  General:  NAD, alert, well developed well nourished. Msk:  Back/L hip: FROM - pain with full flexion and with extension. No gross deformity. TTP left paraspinal and in left buttock. No midline bony TTP. No greater trochanter TTP. FROM bilateral hips without pain Negative log rolls Strength 5/5 BLEs Reflexes trace bilateral achilles and patellar tendons and equal Sensation diminished lateral left lower leg and foot. SLR negative bilaterally.   Impression & Recommendations:  Problem # 1:  BACK PAIN, LUMBAR, WITH RADICULOPATHY (ICD-724.4) Assessment Deteriorated Continue current dose of amitriptyline.  Can take tylenol as needed for pain beyond this.  Avoid narcotics.  Try PT again for 4-6 weeks - emphasized importance of home exercise program.  Not much else to offer beyond PT.  Follow up with PCP going forward.  Her updated medication list for this problem includes:    Tylenol 8 Hour 650 Mg Cr-tabs (Acetaminophen) .Marland Kitchen... Take 1 tablet by mouth three times a day as needed for pain.  Complete Medication List: 1)  Lisinopril 40  Mg Tabs (Lisinopril) .... Take 1 tablet by mouth once a day 2)  Hydrochlorothiazide 25 Mg Tabs (Hydrochlorothiazide) .... Take 1 tablet by mouth once a day 3)  Amitriptyline Hcl 100 Mg Tabs (Amitriptyline hcl) .Marland Kitchen.. 1 by mouth qhs 4)  First-mouthwash Blm Susp (Dph-lido-alhydr-mghydr-simeth) .... 5 cc swish and swallow three times a day 5)  Tylenol 8 Hour 650 Mg Cr-tabs (Acetaminophen) .... Take 1 tablet by mouth three times a day as needed for pain. 6)  Cvs Nicotine 7 Mg/24hr Pt24 (Nicotine) .... One patch a day per instruction. 7)  Klonopin 0.5 Mg Tabs (Clonazepam) .Marland Kitchen.. 1 by mouth qhs  Patient Instructions: 1)  Continue taking your amitriptyline as you have been. 2)  It's very important you do the home stretches shown for your SI joint, back, and piriformis muscles. 3)  Follow up with Dr. Threasa Beards for your back from this point forward.
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
## 2010-09-07 NOTE — Miscellaneous (Signed)
[facility]: [facility]   Imported By: Marily Memos 01/15/2010 11:06:56  _____________________________________________________________________  External Attachment:    Type:   Image     Comment:   External Document

## 2010-09-09 NOTE — Progress Notes (Signed)
Summary: refill/gg  Phone Note Refill Request  on July 19, 2010 2:20 PM  Refills Requested: Medication #1:  HYDROCHLOROTHIAZIDE 25 MG TABS Take 1 tablet by mouth once a day   Dosage confirmed as above?Dosage Confirmed   Last Refilled: 04/22/2010  Medication #2:  LISINOPRIL 40 MG  TABS Take 1 tablet by mouth once a day   Dosage confirmed as above?Dosage Confirmed   Last Refilled: 04/22/2010  Method Requested: Fax to Local Pharmacy Initial call taken by: Merrie Roof RN,  July 19, 2010 2:20 PM  Follow-up for Phone Call        Rx completed in Dr. Tiajuana Amass Follow-up by: Jackson Latino MD,  July 19, 2010 10:48 PM    Prescriptions: HYDROCHLOROTHIAZIDE 25 MG TABS (HYDROCHLOROTHIAZIDE) Take 1 tablet by mouth once a day  #30 x 11   Entered and Authorized by:   Jackson Latino MD   Signed by:   Jackson Latino MD on 07/19/2010   Method used:   Faxed to ...       [facility] Department (retail)       [address] [place], [place]  [address]       Ph: [phone]       Fax: [phone]   RxID:   [id number] LISINOPRIL 40 MG  TABS (LISINOPRIL) Take 1 tablet by mouth once a day  #30 x 11   Entered and Authorized by:   Jackson Latino MD   Signed by:   Jackson Latino MD on 07/19/2010   Method used:   Faxed to ...       [facility] Department (retail)       [address] [place], [place]  [address]       Ph: [phone]       Fax: [phone]   RxID:   [id number]

## 2010-10-13 ENCOUNTER — Encounter: Payer: Self-pay | Admitting: Internal Medicine

## 2010-10-18 LAB — BASIC METABOLIC PANEL
Chloride: 104 mEq/L (ref 96–112)
GFR calc non Af Amer: >60 mL/min (ref >60)
Glucose, Bld: 103 mg/dL — ABNORMAL HIGH (ref 70–99)
Potassium: 3.5 mEq/L (ref 3.5–5.1)
Sodium: 134 mEq/L — ABNORMAL LOW (ref 135–145)

## 2010-10-18 LAB — URINALYSIS, ROUTINE W REFLEX MICROSCOPIC
Bilirubin Urine: NEGATIVE
Glucose, UA: NEGATIVE mg/dL
Hgb urine dipstick: NEGATIVE
Nitrite: NEGATIVE
Specific Gravity, Urine: 1.012 (ref 1.005–1.030)
pH: 6.5 (ref 5.0–8.0)

## 2010-10-18 LAB — CBC
HCT: 42.7 % (ref 36.0–46.0)
Hemoglobin: 14 g/dL (ref 12.0–15.0)
MCHC: 32.8 g/dL (ref 30.0–36.0)
MCV: 93.6 fL (ref 78.0–100.0)

## 2010-10-18 LAB — DIFFERENTIAL
Basophils Relative: 0 % (ref 0–1)
Eosinophils Absolute: 0.1 10*3/uL (ref 0.0–0.7)
Eosinophils Relative: 1 % (ref 0–5)
Monocytes Absolute: 0.6 10*3/uL (ref 0.1–1.0)
Monocytes Relative: 9 % (ref 3–12)
Neutrophils Relative %: 50 % (ref 43–77)

## 2010-10-18 LAB — RAPID URINE DRUG SCREEN, HOSP PERFORMED
Amphetamines: NOT DETECTED
Tetrahydrocannabinol: NOT DETECTED

## 2010-10-26 ENCOUNTER — Encounter: Payer: Self-pay | Admitting: Internal Medicine

## 2010-10-26 ENCOUNTER — Ambulatory Visit (INDEPENDENT_AMBULATORY_CARE_PROVIDER_SITE_OTHER): Payer: Self-pay | Admitting: Internal Medicine

## 2010-10-26 VITALS — BP 148/93 | HR 63 | Temp 97.6°F | Ht 63.0 in | Wt 140.2 lb

## 2010-10-26 DIAGNOSIS — L293 Anogenital pruritus, unspecified: Secondary | ICD-10-CM

## 2010-10-26 DIAGNOSIS — B182 Chronic viral hepatitis C: Secondary | ICD-10-CM

## 2010-10-26 DIAGNOSIS — I10 Essential (primary) hypertension: Secondary | ICD-10-CM

## 2010-10-26 DIAGNOSIS — N898 Other specified noninflammatory disorders of vagina: Secondary | ICD-10-CM | POA: Insufficient documentation

## 2010-10-26 DIAGNOSIS — M545 Low back pain: Secondary | ICD-10-CM

## 2010-10-26 LAB — HEPATIC FUNCTION PANEL
Alkaline Phosphatase: 56 U/L (ref 39–117)
Bilirubin, Direct: 0.1 mg/dL (ref 0.0–0.3)
Indirect Bilirubin: 0.2 mg/dL (ref 0.0–0.9)
Total Bilirubin: 0.3 mg/dL (ref 0.3–1.2)

## 2010-10-26 MED ORDER — FLUCONAZOLE 150 MG PO TABS: 150.0000 mg | ORAL_TABLET | Freq: Once | ORAL | Status: AC

## 2010-10-26 MED ORDER — NAPROXEN SODIUM 275 MG PO TABS: 275.0000 mg | ORAL_TABLET | Freq: Two times a day (BID) | ORAL | Status: DC

## 2010-10-26 NOTE — Assessment & Plan Note (Signed)
She had vaginal itching, possible from fungus infection, will give diflucan x 1 dose treatment empirically.

## 2010-10-26 NOTE — Assessment & Plan Note (Addendum)
She has HCV and ETOH abuse. But has not checked AFT and [place], will check these, viral load, hepatic panel and FLP. Also advised her to quit drinking, she understood this. Once she quit drinking, will refer her to Hepatitis clinic.

## 2010-10-26 NOTE — Progress Notes (Signed)
  Subjective:    Patient ID: Lorraine [name], female    DOB: 1961/02/18, 50 y.o.   MRN: [id number]  HPI Pt is 50 yo AAF with PMH of HTN, fibromylagia, and chronic back pain and hepatitis B and C  who came here for regular f/u. She still has back pain and took advil two doses and not help too much, but used heating pad before and it helps. Also complained vaginal area itching, but no dysuria or hematuria. No other c/o, such CP, SOB, HA, abdominal pain, melena, diarrhea. Current smoker about 2 cigarettes per day, and used 2 drinks of beer on weekends. She has not got any treatment for her hepatitis, but has been to hepatis .     Review of Systems Per HPI.  Current Outpatient Medications Current Outpatient Prescriptions  Medication Sig Dispense Refill  . amitriptyline (ELAVIL) 100 MG tablet Take 100 mg by mouth at bedtime.        . clonazePAM (KLONOPIN) 0.5 MG tablet Take 0.5 mg by mouth at bedtime.        . hydrochlorothiazide 25 MG tablet Take 25 mg by mouth daily.        Marland Kitchen lisinopril (PRINIVIL,ZESTRIL) 40 MG tablet Take 40 mg by mouth daily.        . nicotine (CVS NICOTINE) 7 mg/24hr Place 1 patch onto the skin daily. Per instructions       . DISCONTD: acetaminophen (TYLENOL 8 HOUR) 650 MG CR tablet Take 650 mg by mouth 3 (three) times daily as needed. For pain        . DISCONTD: DPH-Lido-AlHydr-MgHydr-Simeth (FIRST-MOUTHWASH BLM) SUSP 5 mLs 3 (three) times daily. Swish and swallow         Allergies Review of patient's allergies indicates no known allergies.  No past medical history on file.  No past surgical history on file.     Objective:   Physical Exam General: Vital signs reviewed and noted. Well-developed,well-nourished,in no acute distress; alert,appropriate and cooperative throughout examination. Head: normocephalic, atraumatic. Neck: No deformities, masses, or tenderness noted. Lungs: Normal respiratory effort. Clear to auscultation BL without crackles or wheezes.  Heart:  RRR. S1 and S2 normal without gallop, murmur, or rubs.  Abdomen: BS normoactive. Soft, Nondistended, non-tender.  No masses or organomegaly. Extremities: No pretibial edema. Low back mild tenderness, no other abnormal findings.          Assessment & Plan:

## 2010-10-26 NOTE — Assessment & Plan Note (Addendum)
Lab Results  Component Value Date   NA 134* 08/05/2010   K 3.5 08/05/2010   CL 104 08/05/2010   CO2 27 08/05/2010   BUN 1* 08/05/2010   CREATININE 0.72 08/05/2010    BP Readings from Last 3 Encounters:  10/26/10 148/93  07/14/10 163/101  04/22/10 143/86    BP fairly controlled on HCTZ and ACEIs, no change for now. Recheck at next visit.

## 2010-10-26 NOTE — Assessment & Plan Note (Signed)
Will try naproxen and advised her to use heating pad. Continue exercise.

## 2010-10-26 NOTE — Patient Instructions (Addendum)
Please take all your medications as instructed in your instructions.   We will call you if any abnormal lab results.  Please stop drinking alcohol which is bad to your liver.

## 2010-10-26 NOTE — Assessment & Plan Note (Signed)
Will recheck hepatic panel, this is likely due to ETOH abuse, but hepatitis C may also contribute to this. Advised her to quit drinking, she said she will.

## 2010-10-29 LAB — HEPATITIS C RNA QUANTITATIVE
HCV Quantitative Log: 6.3 {Log} — ABNORMAL HIGH (ref <1.63)
HCV Quantitative: 2010000 IU/mL — ABNORMAL HIGH (ref <43)

## 2010-11-01 ENCOUNTER — Other Ambulatory Visit: Payer: Self-pay | Admitting: *Deleted

## 2010-11-01 DIAGNOSIS — M545 Low back pain: Secondary | ICD-10-CM

## 2010-11-01 MED ORDER — NAPROXEN SODIUM 550 MG PO TABS: 275.0000 mg | ORAL_TABLET | Freq: Two times a day (BID) | ORAL | Status: DC

## 2010-11-01 NOTE — Telephone Encounter (Signed)
Prescription for Naprosyn 500 1/2 tablet twice daily was called to the [facility]  Per order of Dr. Threasa Beards.  Pharmacy only carries the 500 mg strength.

## 2010-11-01 NOTE — Telephone Encounter (Signed)
Pharmacy only carries the Naprosyn 500 mg tablets.  Would you like to change?

## 2010-11-03 ENCOUNTER — Ambulatory Visit (HOSPITAL_COMMUNITY)
Admission: RE | Admit: 2010-11-03 | Discharge: 2010-11-03 | Disposition: A | Discharge status: Home / Self Care | Payer: Self-pay | Source: Ambulatory Visit | Attending: Internal Medicine | Admitting: Internal Medicine

## 2010-11-03 DIAGNOSIS — R945 Abnormal results of liver function studies: Secondary | ICD-10-CM | POA: Insufficient documentation

## 2010-11-10 ENCOUNTER — Encounter: Payer: Self-pay | Admitting: Sports Medicine

## 2010-11-10 ENCOUNTER — Ambulatory Visit (INDEPENDENT_AMBULATORY_CARE_PROVIDER_SITE_OTHER): Payer: Self-pay | Admitting: Sports Medicine

## 2010-11-10 VITALS — BP 135/90

## 2010-11-10 DIAGNOSIS — M545 Low back pain: Secondary | ICD-10-CM

## 2010-11-10 NOTE — Patient Instructions (Signed)
Continue taking amitriptyline at current dosage Ok to increase naproxen to 1 whole tablet twice daily Ok to use heat on back if this helps pain I will call physical therapy to have them set you up with more sessions

## 2010-11-10 NOTE — Assessment & Plan Note (Signed)
Patient has been pretty stable but I think she still has her chronic low back pain issues. With these getting worse over the past 3 months I think we should increase her Naprosyn back to 500 twice a day and I also encouraged her to use some Tylenol in between doses of Naprosyn. I would leave the amitriptyline at 100 mg at at bedtime but we can increase this 250 mg if she simply does not get enough relief.  Since she got good benefit with physical therapy and does not seem to remember her home exercises at this point I would like to send her back for 2-6 sessions with physical therapy and have them teach her a home exercise program that would be effective in lessening her pain and improving her function.  She will return to the internal medicine clinic to her primary physician and see me as needed.

## 2010-11-10 NOTE — Progress Notes (Signed)
  Subjective:    Patient ID: Lorraine [name], female    DOB: January 30, 1961, 50 y.o.   MRN: [id number]  HPI  Pt presents to clinic for f/u of back and leg pain which has worsened over the past 3 months.   Went to PT which was helpful, interested in going back Taking naproxen 500 mg 1/2 tab bid, amitriptyline 100 mg qhs- which she feels are not significantly helpful  She denies any acute injuries in the last few months. Her back pain is at its worse she does get some symptoms radiating down into the left leg and she will get more frequent cramps in that leg as well.  She is usually not weak and less the left leg is tired and then it will give out more quickly than the right.  Review of Systems     Objective:   Physical Exam Pleasant cooperative and not in any acute distress.    No problems with toe or heel walk Straight leg raise bilat, 60 deg is negative  but on left gets tightness SI joints move Leg lengths equal FABER tight bilat Back extension 20 deg, 90 deg back flexion,  Lateral bend and rotation are normal Foot strong in plantar and dorsiflexion, eversion and inversion Good quad and HS strenght   Assessment & Plan:

## 2010-11-22 ENCOUNTER — Ambulatory Visit: Payer: Self-pay

## 2010-11-22 DIAGNOSIS — R5381 Other malaise: Secondary | ICD-10-CM | POA: Insufficient documentation

## 2010-11-22 DIAGNOSIS — M25659 Stiffness of unspecified hip, not elsewhere classified: Secondary | ICD-10-CM | POA: Insufficient documentation

## 2010-11-22 DIAGNOSIS — IMO0001 Reserved for inherently not codable concepts without codable children: Secondary | ICD-10-CM | POA: Insufficient documentation

## 2010-11-22 DIAGNOSIS — M545 Low back pain, unspecified: Secondary | ICD-10-CM | POA: Insufficient documentation

## 2010-11-29 ENCOUNTER — Ambulatory Visit: Payer: Self-pay

## 2010-12-02 ENCOUNTER — Ambulatory Visit: Payer: Self-pay

## 2010-12-06 ENCOUNTER — Ambulatory Visit: Payer: Self-pay | Admitting: Physical Therapy

## 2010-12-08 ENCOUNTER — Ambulatory Visit: Payer: Self-pay | Attending: Sports Medicine | Admitting: Physical Therapy

## 2010-12-08 ENCOUNTER — Encounter: Payer: Self-pay | Admitting: Internal Medicine

## 2010-12-08 ENCOUNTER — Ambulatory Visit (INDEPENDENT_AMBULATORY_CARE_PROVIDER_SITE_OTHER): Payer: Self-pay | Admitting: Internal Medicine

## 2010-12-08 ENCOUNTER — Other Ambulatory Visit (HOSPITAL_COMMUNITY)
Admission: RE | Admit: 2010-12-08 | Discharge: 2010-12-08 | Disposition: A | Discharge status: Home / Self Care | Payer: Self-pay | Source: Ambulatory Visit | Attending: Internal Medicine | Admitting: Internal Medicine

## 2010-12-08 VITALS — BP 124/81 | HR 66 | Temp 98.6°F | Ht 63.0 in | Wt 140.7 lb

## 2010-12-08 DIAGNOSIS — R5381 Other malaise: Secondary | ICD-10-CM | POA: Insufficient documentation

## 2010-12-08 DIAGNOSIS — R0781 Pleurodynia: Secondary | ICD-10-CM

## 2010-12-08 DIAGNOSIS — IMO0001 Reserved for inherently not codable concepts without codable children: Secondary | ICD-10-CM | POA: Insufficient documentation

## 2010-12-08 DIAGNOSIS — Z01419 Encounter for gynecological examination (general) (routine) without abnormal findings: Secondary | ICD-10-CM | POA: Insufficient documentation

## 2010-12-08 DIAGNOSIS — N898 Other specified noninflammatory disorders of vagina: Secondary | ICD-10-CM

## 2010-12-08 DIAGNOSIS — M545 Low back pain, unspecified: Secondary | ICD-10-CM | POA: Insufficient documentation

## 2010-12-08 DIAGNOSIS — L293 Anogenital pruritus, unspecified: Secondary | ICD-10-CM

## 2010-12-08 DIAGNOSIS — M25659 Stiffness of unspecified hip, not elsewhere classified: Secondary | ICD-10-CM | POA: Insufficient documentation

## 2010-12-08 DIAGNOSIS — R079 Chest pain, unspecified: Secondary | ICD-10-CM

## 2010-12-08 DIAGNOSIS — I10 Essential (primary) hypertension: Secondary | ICD-10-CM

## 2010-12-08 NOTE — Assessment & Plan Note (Signed)
She has no injury Hx or fall, may be due to costochondritis or strain, advised to use her current NSAIDs and heating pad. If no better in 2-3 weeks, will have imaging to rule out any bone or hematology lesion.

## 2010-12-08 NOTE — Assessment & Plan Note (Addendum)
She has recurrent vaginal area itching after diflucan was given at last visit. Will recheck wet prep to see if any fungus and performed pap smear with pelvic exam under Dr. Benjaman Lobe supervision, she has mild white vaginal discharge, no erythema.   Wet prep reult shows positive Gardnerella vaginalis, likely her symptom is from BV, so will start flagyl for 7 days. She was informed about the finding and plan and she partner. She understands this.

## 2010-12-08 NOTE — Progress Notes (Signed)
  Subjective:    Patient ID: LAKINA MCINTIRE, female    DOB: [birth year], 50 y.o.   MRN: [id number]  HPI Patient is a 50 yo years old AAF with past medical history  as outlined here who comes to the Clinic for vaginal area itching and right upper sternal bone pain. These have been several weeks ago, no Hx of injury or fall, dull pain. She also has vaginal area itching which recurred after giving diflucan. Her last wet prep in 01/2009 unremarkable, denies unsafe sex. Has no fever, chill, chest pain, shortness of breath, hemoptysis, abdominal pain, nausea, vomiting, diarrhea, melena, dysuria, significant weight change. Denies recent smoking, alcohol or drug abuse. Has been taking all his medications as instructed.     Review of Systems Per HPI.  Current Outpatient Medications Current Outpatient Prescriptions  Medication Sig Dispense Refill  . amitriptyline (ELAVIL) 100 MG tablet Take 100 mg by mouth at bedtime.        . clonazePAM (KLONOPIN) 0.5 MG tablet Take 0.5 mg by mouth at bedtime.        . hydrochlorothiazide 25 MG tablet Take 25 mg by mouth daily.        Marland Kitchen lisinopril (PRINIVIL,ZESTRIL) 40 MG tablet Take 40 mg by mouth daily.        . naproxen sodium (ANAPROX) 550 MG tablet Take 0.5 tablets (275 mg total) by mouth 2 (two) times daily with a meal.  5 tablet  2  . nicotine ([facility]S NICOTINE) 7 mg/24hr Place 1 patch onto the skin daily. Per instructions         Allergies Review of patient's allergies indicates no known allergies.  No past medical history on file.  No past surgical history on file.     Objective:   Physical Exam General: Vital signs reviewed and noted. Well-developed, well-nourished, in no acute distress; alert, appropriate and cooperative throughout examination.  Head: Normocephalic, atraumatic. Right ist rib-sternal junction mild enlargement and tender, no redness.   Neck: No deformities, masses, or tenderness noted.  Lungs:  Normal respiratory effort. Clear to auscultation  BL without crackles or wheezes.  Heart: RRR. S1 and S2 normal without gallop, murmur, or rubs.  Abdomen:  BS normoactive. Soft, Nondistended, non-tender.  No masses or organomegaly.  Extremities: No pretibial edema.  Pelvic exam Unremarkable except mild white discharge                                  Assessment & Plan:

## 2010-12-08 NOTE — Patient Instructions (Addendum)
Please take all your medications as instructed in your instructions.   We will call you if any abnormal lab results.  Please call the Clinic for appointment or go to Emergency Department if your symptoms do not improve or get worse.  Please use icy/heating pad on your pain area.       Costochondritis (Costochondral Separation / Tietze Syndrome) Costochondritis (Tietze syndrome), or costochondral separation, is a swelling and irritation (inflammation) of the tissue (cartilage) that connects your ribs with your breastbone (sternum). It may occur on its own (spontaneously), through damage caused by an accident (trauma), or simply from coughing or minor exercise. It may take up to 6 weeks to get better and longer if you are unable to be conservative in your activities. HOME CARE INSTRUCTIONS  Avoid exhausting physical activity. Try not to strain your ribs during normal activity. This would include any activities using chest, belly (abdominal) and side muscles, especially if heavy weights are used.   Use ice for 15 minutes per hour while awake for the first 2 days. Place the ice in a plastic bag, and place a towel between the bag of ice and your skin.   Only take over-the-counter or prescription medicines for pain, discomfort, or fever as directed by your caregiver.  SEEK IMMEDIATE MEDICAL CARE IF:  Your pain increases or you are very uncomfortable.   An oral temperature above 100 develops.   You develop difficulty with your breathing.   You cough up blood.   You develop worse chest pains, shortness of breath, sweating, or vomiting.   You develop new, unexplained problems (symptoms).  MAKE SURE YOU:   Understand these instructions.   Will watch your condition.   Will get help right away if you are not doing well or get worse.  Document Released: 05/04/2005 Document Re-Released: 10/19/2009 [facility] Patient Information 2011 [place], [place].

## 2010-12-08 NOTE — Assessment & Plan Note (Signed)
Lab Results  Component Value Date   NA 134* 08/05/2010   K 3.5 08/05/2010   CL 104 08/05/2010   CO2 27 08/05/2010   BUN 1* 08/05/2010   CREATININE 0.72 08/05/2010    BP Readings from Last 3 Encounters:  12/08/10 124/81  11/10/10 135/90  10/26/10 148/93   BP well controlled with current regimen of HCTZ and lisinopril. Will continue these.

## 2010-12-09 LAB — WET PREP BY MOLECULAR PROBE
Candida species: NEGATIVE
Gardnerella vaginalis: POSITIVE — AB

## 2010-12-10 MED ORDER — METRONIDAZOLE 500 MG PO TABS: 500.0000 mg | ORAL_TABLET | Freq: Two times a day (BID) | ORAL | Status: AC

## 2010-12-14 ENCOUNTER — Ambulatory Visit: Payer: Self-pay | Admitting: Physical Therapy

## 2010-12-16 ENCOUNTER — Ambulatory Visit: Payer: Self-pay | Admitting: Physical Therapy

## 2010-12-18 ENCOUNTER — Emergency Department (HOSPITAL_COMMUNITY)
Admission: EM | Admit: 2010-12-18 | Discharge: 2010-12-18 | Disposition: A | Discharge status: Home / Self Care | Payer: Self-pay | Attending: Emergency Medicine | Admitting: Emergency Medicine

## 2010-12-18 DIAGNOSIS — I10 Essential (primary) hypertension: Secondary | ICD-10-CM | POA: Insufficient documentation

## 2010-12-18 DIAGNOSIS — J029 Acute pharyngitis, unspecified: Secondary | ICD-10-CM | POA: Insufficient documentation

## 2010-12-18 LAB — RAPID STREP SCREEN (MED CTR MEBANE ONLY): Streptococcus, Group A Screen (Direct): NEGATIVE

## 2010-12-21 ENCOUNTER — Encounter: Payer: Self-pay | Admitting: Internal Medicine

## 2010-12-24 ENCOUNTER — Ambulatory Visit: Payer: Self-pay

## 2010-12-24 NOTE — Op Note (Signed)
[place]. [facility]  Patient:    Lorraine [name], Lorraine [name]                         MRN: [id number] Proc. Date: 01/11/01 Adm. Date:  [id number] Disc. Date: [id number] Attending:  Maryanna Shape                           Operative Report  PREOPERATIVE DIAGNOSIS: Herniated nucleus pulposus, L5-S1.  POSTOPERATIVE DIAGNOSIS: Herniated nucleus pulposus, L5-S1.  OPERATION/PROCEDURE: Microdiskectomy at L5-S1, left.  SURGEON: Reynolds Bowl, M.D.  ASSISTANT: Colon Flattery. Ollen Bowl, M.D.  ANESTHESIA: General.  DESCRIPTION OF PROCEDURE: The patient was given a general anesthetic and by ID given Ancef 1 g.  She was placed on the operative frame and prepped and draped in the usual manner for sterility.  This included the use of Ioban drape.  Two 18 gauge spinal needles were used to mark 5-1 and 4-5 and this was confirmed by x-ray.  Using these as guides I made a posterior incision a couple of inches in length centered over the disk space and carried down it down to the lumbar fascia, which was then divided.  Subperiosteally the paraspinous muscles were reflected laterally and a Taylor retractor put in place.  The L5-S1 interspace was exposed and the ligamentum flavum removed in its lateral one-half, and then I found that the disk space was very tight.  Therefore, I carefully decompressed out laterally to the sidewall, removed some of the lamina of L5 and some of the lamina of S1, and did foraminotomy.  This then allowed the nerve root to be carefully teased over a very tight disk just enough to open the annulus laterally.  I did this to try to decompress from the lateral portion to allow the nerve root to free up.  This did help and eventually I found that the annulus and the disk had essentially exploded or ruptured through posteriorly midline and I was able to get that as it was a fairly large mass and remove it.  Then by multiple passes I pretty well emptied the disk space and  removed some of the prominent loose annulus posteriorly.  Having done that then the nerve root was free on its undersurface proximally and distally.  It should be noted the nerve root did look very tight and was swollen.  Having completed this the area was copiously irrigated.  We were able to avoid any significant bleeders and did not have to use the Bovie.  The retractors were removed and the paraspinous muscles fell in place.  The lumbar fascia was approximated with sutures of 0 Vicryl, the subcutaneous tissues approximated with 2-0 Vicryl, and the skin edges held in apposition with a couple of metal staples and Steri-Strips, following which an added strip dressing was applied.  The patient was taken to the recovery room in good condition.DD:  01/11/01 TD:  01/12/01 Job: [id number]

## 2010-12-24 NOTE — H&P (Signed)
[place]  Patient:    Lorraine [name], Lorraine [name]                           MRN: [id number] Adm. Date:  01/09/01 Attending:  Reynolds Bowl, M.D. CC:         Lorraine [name]. Lorraine [name], M.D. - [facility]  Lorraine [name], M.D. 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  At that time the patient complained about a four-month history of pain in the left calf which gradually increased.  Subsequent evaluations disclosed her problem was HNP at L5-S1.  She was admitted for surgery following failed epidural steroids on or about November 30, 2000; however, she had very elevated liver enzymes.  She has subsequently been evaluated by Dr. 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  It is felt that she has a component of chronic hepatitis-C contributing to her elevated enzymes; however, he feels by a letter of Jan 01, 2001, that she should not be an unusual risk for surgery, and that it would be okay to proceed.  She has therefore come to this office to discuss this, and wanting to go on with her surgery.  She complains of pain in the left buttock, left calf, and the foot.  As I talk to her, she is sitting and leaning to the right, away from the left.  ALLERGIES:  No known drug allergies.  PAST SURGICAL HISTORY:  None.  SOCIAL HISTORY:  She does smoke about one pack of cigarettes per day.  She states that she has cut back.  She drinks large volume ethanol.  She says that she has stopped, but the boyfriend tells me that she continues.  CURRENT MEDICATIONS: 1. Hydrochlorothiazide 25 mg one q.d. 2. Captopril 12.5 mg q.d. 3. Calcium q.d.  PHYSICAL  EXAMINATION:  VITAL SIGNS:  She is 5 feet 3 inches, weighing 138 pounds, blood pressure 130/80, pulse 64, respirations 16, temperature 97.2 degrees.  HEENT:  Extraocular movements are full.  Tympanic membranes appear normal. Pharynx is clear.  There are no upper teeth.  NECK:  Moves well without discomfort.  No palpable masses and no carotid bruits.  CHEST:  Good volume and clear breath sounds.  HEART:  A regular rhythm, no murmurs heard.  ABDOMEN:  Round, soft, nontender.  No palpable mass.  I did not feel the liver edge.  Bowel sounds are normal.  ORTHOPEDIC:  The patient could not toe walk on the left because of plantar flexion weakness.  She is sitting, leaning to the right.  Straight leg raising on the left is positive to 65 degrees.  On the right it is negative.  She feels numbness about her left heel.  She has good femoral and dorsalis pedis pulses.  Her MRI shows a large herniated nucleus pulposus at L5-S1 to the left.  ADMISSION DIAGNOSES: 1. Herniated nucleus pulposus, L5-S1, left. 2. Hepatitis, secondary to ethanol. 3. Some component of chronic hepatitis-C. 4. She is a cigarette smoker.  PLAN:  Microdiskectomy at L5-S1.  I fully discussed this with her.  She knows there are no guaranties, but would like  to proceed.  Today a prescription was written for Tylox, to be used postoperatively. DD:  01/09/01 TD:  01/09/01 Job: [id number]

## 2011-01-04 ENCOUNTER — Emergency Department (HOSPITAL_COMMUNITY)
Admission: EM | Admit: 2011-01-04 | Discharge: 2011-01-04 | Disposition: A | Discharge status: Home / Self Care | Payer: Self-pay | Attending: Emergency Medicine | Admitting: Emergency Medicine

## 2011-01-04 DIAGNOSIS — M79609 Pain in unspecified limb: Secondary | ICD-10-CM | POA: Insufficient documentation

## 2011-01-04 DIAGNOSIS — I10 Essential (primary) hypertension: Secondary | ICD-10-CM | POA: Insufficient documentation

## 2011-01-05 ENCOUNTER — Encounter: Payer: Self-pay | Admitting: Internal Medicine

## 2011-01-07 ENCOUNTER — Ambulatory Visit (INDEPENDENT_AMBULATORY_CARE_PROVIDER_SITE_OTHER): Payer: Self-pay | Admitting: Internal Medicine

## 2011-01-07 ENCOUNTER — Encounter: Payer: Self-pay | Admitting: Internal Medicine

## 2011-01-07 DIAGNOSIS — T148 Other injury of unspecified body region: Secondary | ICD-10-CM

## 2011-01-07 DIAGNOSIS — W57XXXA Bitten or stung by nonvenomous insect and other nonvenomous arthropods, initial encounter: Secondary | ICD-10-CM

## 2011-01-07 DIAGNOSIS — M7989 Other specified soft tissue disorders: Secondary | ICD-10-CM

## 2011-01-07 DIAGNOSIS — I10 Essential (primary) hypertension: Secondary | ICD-10-CM

## 2011-01-07 LAB — CBC WITH DIFFERENTIAL/PLATELET
Basophils Absolute: 0 10*3/uL (ref 0.0–0.1)
Eosinophils Absolute: 0.2 10*3/uL (ref 0.0–0.7)
Eosinophils Relative: 2 % (ref 0–5)
HCT: 41.9 % (ref 36.0–46.0)
Lymphocytes Relative: 45 % (ref 12–46)
Lymphs Abs: 3.5 10*3/uL (ref 0.7–4.0)
MCH: 32.1 pg (ref 26.0–34.0)
MCV: 93.9 fL (ref 78.0–100.0)
Neutro Abs: 3.3 10*3/uL (ref 1.7–7.7)
Platelets: 335 10*3/uL (ref 150–400)
RBC: 4.46 MIL/uL (ref 3.87–5.11)
RDW: 13.9 % (ref 11.5–15.5)
WBC: 7.8 10*3/uL (ref 4.0–10.5)

## 2011-01-07 LAB — SEDIMENTATION RATE: Sed Rate: 12 mm/hr (ref 0–22)

## 2011-01-07 LAB — HIV ANTIBODY (ROUTINE TESTING W REFLEX): HIV: NONREACTIVE

## 2011-01-07 LAB — C-REACTIVE PROTEIN: CRP: 0.1 mg/dL (ref <0.6)

## 2011-01-07 MED ORDER — DIPHENHYDRAMINE HCL 25 MG PO CAPS: 25.0000 mg | ORAL_CAPSULE | ORAL | Status: DC | PRN

## 2011-01-07 NOTE — Patient Instructions (Addendum)
Please take your medicines as prescribed. Please schedule a follow up appointment in 1 week.

## 2011-01-08 LAB — COMPLETE METABOLIC PANEL WITH GFR
ALT: 31 U/L (ref 0–35)
AST: 177 U/L — ABNORMAL HIGH (ref 0–37)
BUN: 8 mg/dL (ref 6–23)
Calcium: 9.3 mg/dL (ref 8.4–10.5)
Creat: 0.65 mg/dL (ref 0.50–1.10)
GFR, Est African American: >60 mL/min (ref >60)
Total Bilirubin: 0.4 mg/dL (ref 0.3–1.2)

## 2011-01-09 NOTE — Progress Notes (Signed)
  Subjective:    Patient ID: Lorraine [name], female    DOB: Oct 06, 1960, 50 y.o.   MRN: [id number]  HPI 50 year old woman with past medical history significant for fibromyalgia, hypertension who presented to the clinic with pain and  swelling in her right thumb for past 2 weeks. The onset was gradual , swelling has been progressively getting worse  associated with pain on abduction and adduction of her thumb. Patient was also seen in the [facility] 3 days ago  on 5/29  for similar pain and was discharged on doxycycline and ibuprofen for suspected inflammation from insect bite. She also complains of pain in her bilateral shoulder, elbow, wrist and knee joints but she is currently most concerned about her right thumb swelling and pain. Denies any trauma or injury, does not recall insect bite. Also denies fever, chills, N/V/D     Review of Systems  Constitutional: Negative for activity change, appetite change and fatigue.  Lungs: denies SOB, wheezing or choking. Musculoskeletal: positive for arthralgias Neurologic: Denies dizziness, weakness.     Objective:   Physical Exam; General: alert, cooperative, NAD Lungs: clear to auscultation bilaterally CVS: RRR, S1 , S2 normal, no M/R/G. Musculoskeletal: right thumb swelling present, no erythema, restricted abduction and adduction of thumb. Extremities: no edema Pulses: 2+ symmetric.         Assessment & Plan:

## 2011-01-10 DIAGNOSIS — M7989 Other specified soft tissue disorders: Secondary | ICD-10-CM | POA: Insufficient documentation

## 2011-01-10 NOTE — Assessment & Plan Note (Signed)
For her right thumb pain and swelling, DD include carpal tunnel syndrome( given the distribution of pain) vs rheumatoid arthritis vs gout vs insect bite. At this point the etiology for her symptoms is not clear.With persistent or may be slightly worsening swelling for 2 weeks, insect bite is less likely. Will check CBC, CMP, ESR, CRP and HIV with today's visit. Will defer any imaging at this time . Will have her come back in 1 week and may consider further work up including RA factor, imaging during that visit. Will continue treating her pain with ibuprofen for now.

## 2011-01-10 NOTE — Assessment & Plan Note (Signed)
BP well controlled. Will continue with current treatment.

## 2011-01-14 ENCOUNTER — Ambulatory Visit (INDEPENDENT_AMBULATORY_CARE_PROVIDER_SITE_OTHER): Payer: Self-pay | Admitting: Internal Medicine

## 2011-01-14 ENCOUNTER — Ambulatory Visit (HOSPITAL_COMMUNITY)
Admission: RE | Admit: 2011-01-14 | Discharge: 2011-01-14 | Disposition: A | Discharge status: Home / Self Care | Payer: Self-pay | Source: Ambulatory Visit | Attending: Internal Medicine | Admitting: Internal Medicine

## 2011-01-14 ENCOUNTER — Encounter: Payer: Self-pay | Admitting: Internal Medicine

## 2011-01-14 VITALS — BP 113/75 | HR 60 | Temp 97.2°F | Ht 63.0 in | Wt 140.6 lb

## 2011-01-14 DIAGNOSIS — R609 Edema, unspecified: Secondary | ICD-10-CM | POA: Insufficient documentation

## 2011-01-14 DIAGNOSIS — M542 Cervicalgia: Secondary | ICD-10-CM | POA: Insufficient documentation

## 2011-01-14 DIAGNOSIS — M25549 Pain in joints of unspecified hand: Secondary | ICD-10-CM | POA: Insufficient documentation

## 2011-01-14 DIAGNOSIS — M79641 Pain in right hand: Secondary | ICD-10-CM

## 2011-01-14 DIAGNOSIS — I10 Essential (primary) hypertension: Secondary | ICD-10-CM

## 2011-01-14 DIAGNOSIS — M79609 Pain in unspecified limb: Secondary | ICD-10-CM

## 2011-01-14 DIAGNOSIS — M47812 Spondylosis without myelopathy or radiculopathy, cervical region: Secondary | ICD-10-CM | POA: Insufficient documentation

## 2011-01-14 DIAGNOSIS — IMO0001 Reserved for inherently not codable concepts without codable children: Secondary | ICD-10-CM

## 2011-01-14 MED ORDER — TRAMADOL HCL 50 MG PO TABS: 50.0000 mg | ORAL_TABLET | Freq: Four times a day (QID) | ORAL | Status: DC | PRN

## 2011-01-14 NOTE — Assessment & Plan Note (Signed)
?   Carpel tunnel vs Lyme's disease. Did not see any tick bite mark or target lesion. I will start workup with xrays of hand and neck. I gave her tramadol short course for pain releif. If symptoms not improved further workup for carpel tunnel (ultrasound/ nerve conduction study) vs. Serology for lyme to be considered.

## 2011-01-14 NOTE — Assessment & Plan Note (Signed)
Not sure if current complains related to fibromyalgia, no further therapy at this time.

## 2011-01-14 NOTE — Assessment & Plan Note (Signed)
BP is controlled. She reports compliance with medications.

## 2011-01-14 NOTE — Patient Instructions (Signed)
Return in one week.  

## 2011-01-14 NOTE — Progress Notes (Signed)
  Subjective:    Patient ID: Lorraine [name], female    DOB: 27-Dec-1960, 50 y.o.   MRN: [id number]  Neck Pain   Wrist Pain    50 year old woman with past medical history significant for fibromyalgia, hypertension who presented to the clinic with pain and  swelling in her right thumb for past 4 weeks. The onset was gradual , swelling has been progressively getting worse  associated with pain on abduction and adduction of her thumb. Patient was also seen in the [facility] 3 days ago  on 5/29  for similar pain and was discharged on doxycycline and ibuprofen for suspected inflammation from insect bite. She also complains of pain in her bilateral shoulder, elbow, wrist and knee joints but she is currently most concerned about her right thumb swelling and pain.  She had extensive labwork last visit that did not show any evidence of systemic disease. She also now reports tingling and numbness in right hand. She also reports pain in the cervical area.      Review of Systems  Constitutional: Negative for activity change, appetite change and fatigue.  HENT: Positive for neck pain.   Lungs: denies SOB, wheezing or choking. Musculoskeletal: positive for arthralgias Neurologic: Denies dizziness, weakness.     Objective:   Physical Exam ; General: alert, cooperative, NAD Lungs: clear to auscultation bilaterally CVS: RRR, S1 , S2 normal, no M/R/G. Musculoskeletal: right thumb swelling present, no erythema, restricted abduction and adduction of thumb. Positive flexion sign with tingling and numbness in median nerve area. Cervical area has muscular pain and stiffness.  Extremities: no edema Pulses: 2+ symmetric.         Assessment & Plan:

## 2011-01-21 ENCOUNTER — Encounter: Payer: Self-pay | Admitting: Internal Medicine

## 2011-01-21 ENCOUNTER — Ambulatory Visit (INDEPENDENT_AMBULATORY_CARE_PROVIDER_SITE_OTHER): Payer: Self-pay | Admitting: Internal Medicine

## 2011-01-21 VITALS — BP 145/91 | HR 59 | Temp 98.1°F | Ht 63.0 in | Wt 142.9 lb

## 2011-01-21 DIAGNOSIS — M5090 Cervical disc disorder, unspecified, unspecified cervical region: Secondary | ICD-10-CM

## 2011-01-21 MED ORDER — HYDROCODONE-ACETAMINOPHEN 5-500 MG PO TABS: 1.0000 | ORAL_TABLET | Freq: Three times a day (TID) | ORAL | Status: DC | PRN

## 2011-01-21 NOTE — Patient Instructions (Signed)
Come back in one month Nurse will call you with the appointment for MRI

## 2011-01-21 NOTE — Progress Notes (Signed)
  Subjective:    Patient ID: Lorraine [name], female    DOB: 21-Dec-1960, 50 y.o.   MRN: [id number]  Hand Pain   Neck Pain   Wrist Pain    50 year old woman with past medical history significant for fibromyalgia, hypertension who presented to the clinic with pain and  swelling in her right thumb for past 4 weeks. She also has neck pain which she reports has not improved. The onset was gradual , swelling has been progressively getting worse  associated with pain on abduction and adduction of her thumb. Patient was also seen in the [facility]  on 5/29  for similar pain and was discharged on doxycycline and ibuprofen for suspected inflammation from insect bite. She also complains of pain in her bilateral shoulder, elbow, wrist and knee joints but she is currently most concerned about her right thumb swelling and pain.  She had extensive labwork last visit that did not show any evidence of systemic disease. She also now reports tingling and numbness in right hand. She also reports pain in the cervical area.      Review of Systems  Constitutional: Negative for activity change, appetite change and fatigue.  HENT: Positive for neck pain.   Lungs: denies SOB, wheezing or choking. Musculoskeletal: positive for arthralgias Neurologic: Denies dizziness, weakness.     Objective:   Physical Exam ; General: alert, cooperative, NAD Lungs: clear to auscultation bilaterally CVS: RRR, S1 , S2 normal, no M/R/G. Musculoskeletal: right thumb swelling present, no erythema, restricted abduction and adduction of thumb. Positive flexion sign with tingling and numbness in median nerve area. Cervical area has muscular pain and stiffness.  Extremities: no edema Pulses: 2+ symmetric.         Assessment & Plan:

## 2011-01-21 NOTE — Assessment & Plan Note (Signed)
I will get MRI of spine. Patient given vicodin for pain releif. Consider neurosurgeon referral based on findings.

## 2011-01-26 ENCOUNTER — Ambulatory Visit (HOSPITAL_COMMUNITY)
Admission: RE | Admit: 2011-01-26 | Discharge: 2011-01-26 | Disposition: A | Discharge status: Home / Self Care | Payer: Self-pay | Source: Ambulatory Visit | Attending: Internal Medicine | Admitting: Internal Medicine

## 2011-01-26 DIAGNOSIS — M79609 Pain in unspecified limb: Secondary | ICD-10-CM | POA: Insufficient documentation

## 2011-01-26 DIAGNOSIS — M5090 Cervical disc disorder, unspecified, unspecified cervical region: Secondary | ICD-10-CM

## 2011-01-26 DIAGNOSIS — M5020 Other cervical disc displacement, unspecified cervical region: Secondary | ICD-10-CM | POA: Insufficient documentation

## 2011-01-26 DIAGNOSIS — M542 Cervicalgia: Secondary | ICD-10-CM | POA: Insufficient documentation

## 2011-01-26 MED ORDER — GADOBENATE DIMEGLUMINE 529 MG/ML IV SOLN
15.0000 mL | Freq: Once | INTRAVENOUS | Status: AC
  Administered 2011-01-26: 15 mL via INTRAVENOUS

## 2011-02-01 ENCOUNTER — Telehealth: Payer: Self-pay | Admitting: *Deleted

## 2011-02-01 DIAGNOSIS — M5090 Cervical disc disorder, unspecified, unspecified cervical region: Secondary | ICD-10-CM

## 2011-02-01 NOTE — Telephone Encounter (Signed)
Pt called asking for results of MRI study of c/spine from 6/20.  Please call pt at [phone]  IMPRESSION:  1. Shallow broad-based disc protrusion at C7-T1 with mild mass  effect on the ventral thecal sac.  2. Small left paracentral disc protrusion at T1-2.

## 2011-02-02 MED ORDER — GABAPENTIN 100 MG PO CAPS: 100.0000 mg | ORAL_CAPSULE | Freq: Three times a day (TID) | ORAL | Status: DC

## 2011-02-02 MED ORDER — HYDROCODONE-ACETAMINOPHEN 5-500 MG PO TABS: 2.0000 | ORAL_TABLET | Freq: Three times a day (TID) | ORAL | Status: DC | PRN

## 2011-02-02 NOTE — Telephone Encounter (Signed)
Both Rx's called in vicodin to [facility] to [facility]

## 2011-02-02 NOTE — Telephone Encounter (Signed)
Spoke with Lorraine [name] about her MRI results. She continues to have neck pain with radiculopathy consistent with disc protrusion finding. I will add on neurontin to her med list and she can double her vicodin dose if needed.Please call her with an appointment in 2 weeks and also call in new medication.

## 2011-02-08 ENCOUNTER — Encounter: Payer: Self-pay | Admitting: Internal Medicine

## 2011-02-08 ENCOUNTER — Ambulatory Visit (INDEPENDENT_AMBULATORY_CARE_PROVIDER_SITE_OTHER): Payer: Self-pay | Admitting: Internal Medicine

## 2011-02-08 DIAGNOSIS — M5090 Cervical disc disorder, unspecified, unspecified cervical region: Secondary | ICD-10-CM

## 2011-02-08 DIAGNOSIS — F17200 Nicotine dependence, unspecified, uncomplicated: Secondary | ICD-10-CM

## 2011-02-08 DIAGNOSIS — M79609 Pain in unspecified limb: Secondary | ICD-10-CM

## 2011-02-08 DIAGNOSIS — I10 Essential (primary) hypertension: Secondary | ICD-10-CM

## 2011-02-08 DIAGNOSIS — M79641 Pain in right hand: Secondary | ICD-10-CM

## 2011-02-08 MED ORDER — AMLODIPINE BESYLATE 5 MG PO TABS: 5.0000 mg | ORAL_TABLET | Freq: Every day | ORAL | Status: DC

## 2011-02-08 NOTE — Assessment & Plan Note (Signed)
Pressure elevated today, confirmed with recheck.  Since she has been taking her meds consistently, we will add amlodipine 5mg  PO daily.  Plan: -Add Amlodipine 5 mg PO daily          -Continue lisinopril 40mg  daily and HCTZ 25 mg daily.

## 2011-02-08 NOTE — Patient Instructions (Signed)
Please return to clinic in 2-3 weeks (in the months of July).  We are starting you on a third blood pressure medicine called amlodipine. Please take this once per day by mouth.  We are referring you to a neurosurgeon at to evaluate your neck and arm pain.  This will likely be at [facility].    A social worker will be contact you in the next couple weeks to discuss options for your transportation difficulties.

## 2011-02-08 NOTE — Progress Notes (Deleted)
  Subjective:    Patient ID: Lorraine [name], female    DOB: 03/04/1961, 50 y.o.   MRN: [id number]  HPI    Review of Systems     Objective:   Physical Exam        Assessment & Plan:   

## 2011-02-08 NOTE — Progress Notes (Deleted)
  Subjective:    Patient ID: Lorraine [name], female    DOB: [birth year], 50 y.o.   MRN: [id number]  HPI    Review of Systems     Objective:   Physical Exam        Assessment & Plan:

## 2011-02-08 NOTE — Assessment & Plan Note (Addendum)
Lorraine [name] distribution of pain throughout both arms from hand to neck lack of significant focal tenderness to palpation could correlate with the MRI finding of disc protrusions in the cervical spine from C7 to T1.    Plan: We are referring her to for neurosurgery consultation at [facility]          We are consulting social work because she does not have transportation          Continue Percocet 5/500mg  two tablets q8h PRN for now.  She just had this refilled in recent days.  When she runs out, switch to 10/500mg  one tablet q8h PRN to decrease acetaminophen dose in the long term.            Continue Neurontin 100mg  TID          Follow-up with [place] in 2-3 weeks to monitor progression.

## 2011-02-08 NOTE — Assessment & Plan Note (Signed)
Discussed the prospect of quitting with her.  Congratulated her on being down to 2 cigarettes per day.  She is amenable to discussing ways to quit during her next visit.

## 2011-02-08 NOTE — Progress Notes (Signed)
Patient ID: Lorraine [name], female DOB: Feb 23, 1961, 50 y.o. MRN: [id number]    HPI   Lorraine [name] is a 50 y/o F with a history of HTN and lower back and leg pain s/p lumbar L5/s1 microdiskectomy in 2002 who has had bilateral hand, arm, and neck pain (R>L) for the past month.  She reports that the pain is constant and getting worse, 9/10 severity today.  She reports that it has a sharp stabbing quality, and radiates from her R hand to her neck, over the top of her arm.  She also has radiating pain in her L hand.  The worst pain in in her R 3rd digit, but the pain is diffuse.  She has tried taking Percocet 5/500mg  and Neurontin 100mg  TID, but these do not improve the pain at all.  She reports that her neck pain is bad, and it hurts whenever she rotates her neck. This pain is bilateral.    She had a hand xray last month that was normal.  Cervical MRI showed a "shallow broad-based disc protrusion at C7-T1 with mild mass effect on the ventral thecal sac" and a "small left paracentral disc protrusion at T1-2".   She has had chronic back (lumbar and thoracic) pain and left leg pain since lumbar L5/s1 microdiskectomy in 2002, for which she has seen sports medicine and physical therapy.  She now does home exercises.  This pain is unchanged and her new neck/arm/hand pain is distinctly new.    REVIEW OF SYSTEMS  Review of Systems - History obtained from the patient General ROS: negative for - chills, fatigue, weight gain or weight loss ENT ROS: negative for - nasal discharge, sore throat or visual changes Respiratory ROS: negative for - cough, shortness of breath or wheezing Cardiovascular ROS: negative for - chest pain, palpitations or shortness of breath Gastrointestinal ROS: negative for - change in bowel habits, change in stools, constipation, diarrhea, nausea/vomiting or stool incontinence Genito-Urinary ROS: negative for - change in urinary stream, dysuria or urinary frequency/urgency Musculoskeletal ROS:  positive for - See HPI Neurological ROS: positive for - See HPI, negative for - bowel and bladder control changes or impaired coordination/balance Dermatological ROS: negative for rash  PHYSICAL EXAM General: alert, well-developed, and cooperative to examination.  Head: normocephalic and atraumatic.  Eyes: vision grossly intact, pupils equal, pupils round, pupils reactive to light, no injection and anicteric.  Mouth: pharynx pink and moist, no erythema, and no exudates.  Neck: ROM restricted in rotation bidirectionally with associated pain. No thyromegaly, no JVD. Lungs: normal respiratory effort, normal breath sounds, no crackles, and no wheezes. Heart: normal rate, regular rhythm, no murmur, no gallop, and no rub.  Msk: no joint swelling, no joint warmth, and no redness over joints in either hand.  Full range of motion in upper extremities, but with pain in shoulder abduction bilaterally. 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  Upper extremity strength: 5-/5 in right arm, 5/5 in left arm. Brachioradialis, triceps, and biceps reflexes symmetric and normal bilaterally. Sensation intact and symmetric to light touch (cotton) in upper extremities bilaterally.  Difficult to examine tenderness to palpation due to constant pain, but no definite increased tenderness to palpation present.   Skin: turgor normal and no rashes.  Psych: Oriented X3, memory intact for recent and remote,  not anxious appearing, not depressed appearing.  Assessment and Plan:

## 2011-02-08 NOTE — Progress Notes (Signed)
I saw patient and discussed her care with resident Dr. Wainwright.  I agree with the clinical findings and plans as outlined in his note. 

## 2011-02-24 ENCOUNTER — Other Ambulatory Visit: Payer: Self-pay | Admitting: Internal Medicine

## 2011-02-24 DIAGNOSIS — M5090 Cervical disc disorder, unspecified, unspecified cervical region: Secondary | ICD-10-CM

## 2011-02-24 NOTE — Telephone Encounter (Signed)
Refill approved - nurse to complete. 

## 2011-02-24 NOTE — Telephone Encounter (Signed)
Called to pharm 

## 2011-03-09 ENCOUNTER — Encounter: Payer: Self-pay | Admitting: Internal Medicine

## 2011-03-09 ENCOUNTER — Ambulatory Visit (INDEPENDENT_AMBULATORY_CARE_PROVIDER_SITE_OTHER): Payer: Self-pay | Admitting: Internal Medicine

## 2011-03-09 VITALS — BP 146/94 | HR 73 | Temp 97.8°F | Ht 63.0 in | Wt 143.2 lb

## 2011-03-09 DIAGNOSIS — I10 Essential (primary) hypertension: Secondary | ICD-10-CM

## 2011-03-09 DIAGNOSIS — M5090 Cervical disc disorder, unspecified, unspecified cervical region: Secondary | ICD-10-CM

## 2011-03-09 DIAGNOSIS — F17200 Nicotine dependence, unspecified, uncomplicated: Secondary | ICD-10-CM

## 2011-03-09 MED ORDER — HYDROCODONE-ACETAMINOPHEN 5-500 MG PO TABS: 1.0000 | ORAL_TABLET | Freq: Two times a day (BID) | ORAL | Status: DC | PRN

## 2011-03-09 MED ORDER — NAPROXEN 500 MG PO TABS: 500.0000 mg | ORAL_TABLET | Freq: Two times a day (BID) | ORAL | Status: DC

## 2011-03-09 NOTE — Assessment & Plan Note (Signed)
Patient's MRI revealed mild disc disease which will likely not benefit from surgery.  We decided to defer from a referral to [facility] at this time.  She reports that pain improved with PT.  At this time, we encouraged that she decrease her Vicodin dose to 1 tab BID, and supplement Alieve/Tylenol for pain.  We also encouraged use of warm baths/heating pad as well as doing some of the exercises she learned in PT at home.  She was agreeable to this plan.  I will have her follow up in 5 weeks to reassess her pain.  If worsens/not improved, we will consider referral to PT again.    She denies history of drug abuse, but she is relatively healthy, and we suggested that she may not want to be dependent on narcotics given no definitive diagnosis that may be causing extreme pain.  She agreed that she did not want to increase dose of opioids and will try to decrease dose.

## 2011-03-09 NOTE — Patient Instructions (Signed)
-  Please decrease Vicoden use to one tablet twice a day.  -Please switch between using Naproxen (Alieve) and Tylenol during the day for pain relief.  Be sure to take Naproxen on a full stomach.  Both of these medications are available over the counter.  -Please use a heating pad/warm baths to try to relieve pain  -Please continue considering smoking cessation.  If interested in a smoking cessation program, please call and ask about it at [phone]  -Please try to do exercises that you learned in physical therapy at home.  -If pain worsens, please schedule an appointment to be seen at the internal medicine clinic.    -Please schedule a follow up appointment in 5-6 weeks to re-evaluate pain, at which point we may reconsider restarting physical therapy.   We will also re-evaluate your blood pressure once you start Norvasc (Amlodipine) next week.   Thank you!

## 2011-03-09 NOTE — Assessment & Plan Note (Signed)
Patient's blood pressure is currently elevated.  She is currently taking Lisinopril and HCTZ, but has not yet acquired her prescription of Amlodipine that was prescribed as her last visit.  She is scheduled to receive her medication next Wednesday February 13, 2011 by MAP (Medication Assistance Program)  Plan: Patient will follow up in about 5 weeks where we will recheck BP.

## 2011-03-09 NOTE — Progress Notes (Signed)
  Subjective:    Patient ID: Lorraine [name], female    DOB: 06-20-1961, 50 y.o.   MRN: [id number]  HPI  This is a 50 yo F with PMH of HTN and cervical/right arm/right foot/left leg pain.  She denies an inciting incident.  She went to PT a few months ago, which she reports helped.  She has been taking Vicoden 2 tabs q8 hours, as well as naproxen.  She describes the pain as achy with occasional numbness/tingling, and is 9/10 at it's worst.  The pain is constant, and has never completely resolved.  She reports that the pain in her neck is worse in the morning, and she cannot turn her head.  Nothing really alleviates the pain completely, and pain worsens with movement, except for her left leg pain that she describes as crampy and worsens with sitting still but is alleviated by walking around.  She notes that the pain makes it difficult to cook and fix her hair.  Because of her neck pain, she experiences diffuse headaches, 8/10, that is relieved with vicodin.   Review of Systems ROS: General: no fevers, chills, changes in weight, changes in appetite Skin: no rash HEENT: no blurry vision, hearing changes, sore throat Pulm: no dyspnea, coughing, wheezing CV: no chest pain, palpitations, shortness of breath Abd: no abdominal pain, nausea/vomiting, diarrhea/constipation GU: no dysuria, hematuria, polyuria Neuro: no weakness    Objective:   Physical Exam  General: no acute distress, comfortable sitting in chair HEENT: PERRL, EOMI, no scleral icterus Cardiac: RRR, no rubs, murmurs or gallops Pulm: clear to auscultation bilaterally, moving normal volumes of air Abd: soft, nontender, nondistended, BS present Ext: warm and well perfused, no pedal edema Neuro: alert and oriented X3, cranial nerves II-XII grossly intact, strength equal in bilateral upper and lower extremities; no focal sensory deficits   Assessment & Plan:

## 2011-03-09 NOTE — Assessment & Plan Note (Signed)
Patient currently reports smoking 3 cigarettes per day.  We counseled her on the benefits of smoking cessation for her own health, and the health of her 52 year old granddaughter that she lives with.  She is currently not interested in quitting, but was encouraged to call the clinic should she be inclined to quit.  We informed her that we can prescribe a nicotine patch as well as enroll her in a smoking cessation program.

## 2011-03-15 NOTE — Progress Notes (Signed)
I interviewed and examined this patient with Dr. Milbert Coulter.  I agree with her plans and notes.

## 2011-03-16 ENCOUNTER — Other Ambulatory Visit: Payer: Self-pay | Admitting: *Deleted

## 2011-03-16 DIAGNOSIS — I10 Essential (primary) hypertension: Secondary | ICD-10-CM

## 2011-03-16 MED ORDER — LISINOPRIL 40 MG PO TABS: 40.0000 mg | ORAL_TABLET | Freq: Every day | ORAL | Status: DC

## 2011-03-23 ENCOUNTER — Other Ambulatory Visit: Payer: Self-pay | Admitting: Internal Medicine

## 2011-03-23 MED ORDER — GABAPENTIN 100 MG PO CAPS: 100.0000 mg | ORAL_CAPSULE | Freq: Three times a day (TID) | ORAL | Status: DC

## 2011-03-23 MED ORDER — QUINAPRIL-HYDROCHLOROTHIAZIDE 10-12.5 MG PO TABS: 2.0000 | ORAL_TABLET | Freq: Every day | ORAL | Status: DC

## 2011-04-04 ENCOUNTER — Other Ambulatory Visit: Payer: Self-pay | Admitting: *Deleted

## 2011-04-04 ENCOUNTER — Telehealth: Payer: Self-pay | Admitting: Licensed Clinical Social Worker

## 2011-04-04 DIAGNOSIS — M5090 Cervical disc disorder, unspecified, unspecified cervical region: Secondary | ICD-10-CM

## 2011-04-04 MED ORDER — HYDROCODONE-ACETAMINOPHEN 5-500 MG PO TABS: 1.0000 | ORAL_TABLET | Freq: Two times a day (BID) | ORAL | Status: AC | PRN

## 2011-04-04 NOTE — Telephone Encounter (Signed)
Goal is to eventually wean off of narcotics.  Has an appointment with Dr. Milbert Coulter on 04/26/2011 (23 days from now, although she was given #60 tablets on 03/09/2011 and this should last her until 04/07/2011).  Will therefore give enough vicodin (dispense #38) to get Lorraine [name] to that appointment with a starting date of 04/08/2011.  If the plan is to continue the medication at that time it can be further refilled by Dr. Milbert Coulter as appropriate.

## 2011-04-04 NOTE — Telephone Encounter (Signed)
Tried to call pt this am, no answer and no vmail at #, called script to [facility], it is not to be filled until 8/31 i have now spoken w/ pt and she is agreeable

## 2011-04-04 NOTE — Telephone Encounter (Signed)
Called patient and she has appmt with [facility]/Dr. Lovell Sheehan, neuro tomorrow at 11:30 AM.  She is taking the bus to get there.   She said she is not going to [facility].

## 2011-04-26 ENCOUNTER — Encounter: Payer: Self-pay | Admitting: Internal Medicine

## 2011-04-26 ENCOUNTER — Ambulatory Visit (INDEPENDENT_AMBULATORY_CARE_PROVIDER_SITE_OTHER): Payer: Self-pay | Admitting: Internal Medicine

## 2011-04-26 VITALS — BP 153/94 | HR 72 | Temp 97.7°F | Resp 20 | Ht 64.0 in | Wt 147.2 lb

## 2011-04-26 DIAGNOSIS — M79641 Pain in right hand: Secondary | ICD-10-CM

## 2011-04-26 DIAGNOSIS — Z23 Encounter for immunization: Secondary | ICD-10-CM

## 2011-04-26 DIAGNOSIS — M79609 Pain in unspecified limb: Secondary | ICD-10-CM

## 2011-04-26 DIAGNOSIS — I10 Essential (primary) hypertension: Secondary | ICD-10-CM

## 2011-04-26 DIAGNOSIS — Z0000 Encounter for general adult medical examination without abnormal findings: Secondary | ICD-10-CM
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
## 2011-04-26 NOTE — Assessment & Plan Note (Signed)
Patient's blood pressure is still elevated.  She reports that she is taking the amlodipine that was recently prescribed to her, but has not brought her medications in with her today.   Plan: continue current therapy and encourage her to bring medications with her in two months so we can verify appropriate medication use.  If at that point her blood pressure is still elevated, we may consider increasing amlodipine to 10mg ,.

## 2011-04-26 NOTE — Patient Instructions (Signed)
-  Congratulations on your decision to quit smoking!  You are almost there since you are only smoking 2 cigarettes/day!  Now just use the Nicotine patch to help you quit all together!  - I will prescribe 1 more month of vicodin until you are seen in physical therapy.  Physical therapy will call you regarding an appointment.  -Please continue all of your current medications.

## 2011-04-26 NOTE — Assessment & Plan Note (Addendum)
Patient has similar complaints as last visit.  Plan of decreasing use of vicodin did not work, as she still used 1.5 pills BID in addition to naprosyn.  She notes that PT helped in the past, and Dr. Evelina Dun note from April 2012 suggests that he planned to have more PT scheduled for her.  We will refer her to PT at this time.  I have given her 1 more month of vicodin because she notes the pain is severe and it may take a few weeks for her to be seen by PT.    MRI of her cervical spine in June 2012 was significant for Shallow broad-based disc protrusion at C7-T1 with mild mass  effect on the ventral thecal sac.    Given mild disease, we refrained from a neurosurgery referral, especially because patient notes that she receives relief from PT.  Plan:  Write for Vicodin 5-500 BID #60 Refer to PT, she prefers [address] location Return in 2 months.

## 2011-05-02 ENCOUNTER — Other Ambulatory Visit: Payer: Self-pay | Admitting: Internal Medicine

## 2011-05-02 DIAGNOSIS — Z1231 Encounter for screening mammogram for malignant neoplasm of breast: Secondary | ICD-10-CM

## 2011-05-04 ENCOUNTER — Telehealth: Payer: Self-pay | Admitting: *Deleted

## 2011-05-04 NOTE — Telephone Encounter (Signed)
Call from pharmacy would like to get a prescription for pt's Accuretic.  They have never received a prescription and need verification that pt is no longer on Metoprolol 25 mg.

## 2011-05-05 ENCOUNTER — Ambulatory Visit: Payer: Self-pay | Attending: Internal Medicine | Admitting: Physical Therapy

## 2011-05-05 DIAGNOSIS — R293 Abnormal posture: Secondary | ICD-10-CM | POA: Insufficient documentation

## 2011-05-05 DIAGNOSIS — M2560 Stiffness of unspecified joint, not elsewhere classified: Secondary | ICD-10-CM | POA: Insufficient documentation

## 2011-05-05 DIAGNOSIS — M542 Cervicalgia: Secondary | ICD-10-CM | POA: Insufficient documentation

## 2011-05-05 DIAGNOSIS — M25539 Pain in unspecified wrist: Secondary | ICD-10-CM | POA: Insufficient documentation

## 2011-05-05 DIAGNOSIS — IMO0001 Reserved for inherently not codable concepts without codable children: Secondary | ICD-10-CM | POA: Insufficient documentation

## 2011-05-09 ENCOUNTER — Telehealth: Payer: Self-pay | Admitting: *Deleted

## 2011-05-09 LAB — POCT I-STAT, CHEM 8
BUN: <3 — ABNORMAL LOW
Calcium, Ion: 1.12
Chloride: 103
Glucose, Bld: 105 — ABNORMAL HIGH

## 2011-05-09 LAB — DIFFERENTIAL
Eosinophils Absolute: 0.1
Lymphocytes Relative: 39
Lymphs Abs: 3.2
Monocytes Relative: 10
Neutrophils Relative %: 49

## 2011-05-09 LAB — CBC
HCT: 45
MCV: 95.9
RBC: 4.69
WBC: 8.1

## 2011-05-09 LAB — RAPID URINE DRUG SCREEN, HOSP PERFORMED
Amphetamines: NOT DETECTED
Barbiturates: NOT DETECTED

## 2011-05-09 LAB — ETHANOL: Alcohol, Ethyl (B): <5

## 2011-05-09 NOTE — Telephone Encounter (Signed)
Pt would like lela to call her at the 1st ph# listed on snapshot or at [phone]

## 2011-05-09 NOTE — Telephone Encounter (Signed)
Couldn't find records of metoprolol, and I think epic is showing that accuretic was sent to [facility] already

## 2011-05-10 ENCOUNTER — Encounter: Payer: Self-pay | Admitting: Rehabilitation

## 2011-05-12 ENCOUNTER — Ambulatory Visit: Payer: Self-pay | Attending: Internal Medicine | Admitting: Rehabilitation

## 2011-05-12 DIAGNOSIS — IMO0001 Reserved for inherently not codable concepts without codable children: Secondary | ICD-10-CM | POA: Insufficient documentation

## 2011-05-12 DIAGNOSIS — M25539 Pain in unspecified wrist: Secondary | ICD-10-CM | POA: Insufficient documentation

## 2011-05-12 DIAGNOSIS — M2560 Stiffness of unspecified joint, not elsewhere classified: Secondary | ICD-10-CM | POA: Insufficient documentation

## 2011-05-12 DIAGNOSIS — R293 Abnormal posture: Secondary | ICD-10-CM | POA: Insufficient documentation

## 2011-05-12 DIAGNOSIS — M542 Cervicalgia: Secondary | ICD-10-CM | POA: Insufficient documentation

## 2011-05-17 ENCOUNTER — Ambulatory Visit: Payer: Self-pay | Admitting: Physical Therapy

## 2011-05-19 ENCOUNTER — Encounter: Payer: Self-pay | Admitting: Rehabilitation

## 2011-05-23 ENCOUNTER — Ambulatory Visit (HOSPITAL_COMMUNITY)
Admission: RE | Admit: 2011-05-23 | Discharge: 2011-05-23 | Disposition: A | Discharge status: Home / Self Care | Payer: Self-pay | Source: Ambulatory Visit | Attending: Internal Medicine | Admitting: Internal Medicine

## 2011-05-23 DIAGNOSIS — Z1231 Encounter for screening mammogram for malignant neoplasm of breast: Secondary | ICD-10-CM | POA: Insufficient documentation

## 2011-05-24 ENCOUNTER — Telehealth: Payer: Self-pay | Admitting: *Deleted

## 2011-05-24 NOTE — Telephone Encounter (Signed)
Pt would like to get a refill on her Vicodin 5-500 mg tablets.  Goes to the [facility].

## 2011-05-25 ENCOUNTER — Other Ambulatory Visit: Payer: Self-pay | Admitting: *Deleted

## 2011-05-25 ENCOUNTER — Ambulatory Visit: Payer: Self-pay | Admitting: Rehabilitation

## 2011-05-25 DIAGNOSIS — M5090 Cervical disc disorder, unspecified, unspecified cervical region: Secondary | ICD-10-CM

## 2011-05-25 NOTE — Telephone Encounter (Signed)
Dr. Milbert Coulter says no opiates until clinic visit.  Refer this to her or make an app't for this purpose.

## 2011-05-26 NOTE — Telephone Encounter (Signed)
She needs to be seen in clinic first.  Thanks!

## 2011-05-30 ENCOUNTER — Encounter: Payer: Self-pay | Admitting: Internal Medicine

## 2011-05-30 ENCOUNTER — Ambulatory Visit (INDEPENDENT_AMBULATORY_CARE_PROVIDER_SITE_OTHER): Payer: Self-pay | Admitting: Internal Medicine

## 2011-05-30 VITALS — BP 145/91 | HR 73 | Temp 98.1°F | Ht 63.0 in | Wt 144.3 lb

## 2011-05-30 DIAGNOSIS — I10 Essential (primary) hypertension: Secondary | ICD-10-CM

## 2011-05-30 DIAGNOSIS — M1990 Unspecified osteoarthritis, unspecified site: Secondary | ICD-10-CM

## 2011-05-30 HISTORY — DX: Unspecified osteoarthritis, unspecified site: M19.90

## 2011-05-30 MED ORDER — AMLODIPINE BESYLATE 5 MG PO TABS: 5.0000 mg | ORAL_TABLET | Freq: Every day | ORAL | Status: DC

## 2011-05-30 MED ORDER — QUINAPRIL-HYDROCHLOROTHIAZIDE 10-12.5 MG PO TABS: 2.0000 | ORAL_TABLET | Freq: Every day | ORAL | Status: DC

## 2011-05-30 MED ORDER — ETODOLAC 500 MG PO TABS: 500.0000 mg | ORAL_TABLET | Freq: Two times a day (BID) | ORAL | Status: DC

## 2011-05-30 NOTE — Progress Notes (Addendum)
Pt no-showed 05/25/2011 physical therapy appt and did not cancel or reschedule. Unable to cancel PT order to close referral. Dorie Rank, RN, 05/30/2011, 3: 08P
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
## 2011-05-30 NOTE — Patient Instructions (Addendum)
Please, call with any questions Please, follow up in 4 weeks.

## 2011-05-30 NOTE — Progress Notes (Deleted)
  Subjective:    Patient ID: Lorraine [name], female    DOB: 07-01-1961, 50 y.o.   MRN: [id number]  HPI    Review of Systems     Objective:   Physical Exam        Assessment & Plan:

## 2011-05-31 NOTE — Telephone Encounter (Signed)
Pt has an appointment scheduled for 07/13/2011 with her PCP.

## 2011-06-13 NOTE — Telephone Encounter (Signed)
Pt was just seen by Dr Denton Meek who felt that narcotics were not indicated. Dr Milbert Coulter wanted pt to be managed by NSAID's and PT. I cannot refill narcs. Thanks

## 2011-07-07 ENCOUNTER — Emergency Department (HOSPITAL_COMMUNITY)
Admission: EM | Admit: 2011-07-07 | Discharge: 2011-07-07 | Disposition: A | Discharge status: Home / Self Care | Payer: Self-pay | Attending: Emergency Medicine | Admitting: Emergency Medicine

## 2011-07-07 ENCOUNTER — Encounter (HOSPITAL_COMMUNITY): Payer: Self-pay | Admitting: Nurse Practitioner

## 2011-07-07 DIAGNOSIS — IMO0001 Reserved for inherently not codable concepts without codable children: Secondary | ICD-10-CM | POA: Insufficient documentation

## 2011-07-07 DIAGNOSIS — M7989 Other specified soft tissue disorders: Secondary | ICD-10-CM | POA: Insufficient documentation

## 2011-07-07 DIAGNOSIS — R51 Headache: Secondary | ICD-10-CM | POA: Insufficient documentation

## 2011-07-07 DIAGNOSIS — M549 Dorsalgia, unspecified: Secondary | ICD-10-CM | POA: Insufficient documentation

## 2011-07-07 DIAGNOSIS — G629 Polyneuropathy, unspecified: Secondary | ICD-10-CM

## 2011-07-07 DIAGNOSIS — F17200 Nicotine dependence, unspecified, uncomplicated: Secondary | ICD-10-CM | POA: Insufficient documentation

## 2011-07-07 DIAGNOSIS — B182 Chronic viral hepatitis C: Secondary | ICD-10-CM | POA: Insufficient documentation

## 2011-07-07 DIAGNOSIS — I10 Essential (primary) hypertension: Secondary | ICD-10-CM | POA: Insufficient documentation

## 2011-07-07 DIAGNOSIS — G8929 Other chronic pain: Secondary | ICD-10-CM

## 2011-07-07 DIAGNOSIS — G589 Mononeuropathy, unspecified: Secondary | ICD-10-CM | POA: Insufficient documentation

## 2011-07-07 LAB — POCT I-STAT, CHEM 8
BUN: 4 mg/dL — ABNORMAL LOW (ref 6–23)
Calcium, Ion: 1.1 mmol/L — ABNORMAL LOW (ref 1.12–1.32)
Chloride: 101 meq/L (ref 96–112)
Creatinine, Ser: 0.6 mg/dL (ref 0.50–1.10)
Glucose, Bld: 129 mg/dL — ABNORMAL HIGH (ref 70–99)
HCT: 44 % (ref 36.0–46.0)
Hemoglobin: 15 g/dL (ref 12.0–15.0)
Potassium: 3.7 meq/L (ref 3.5–5.1)
Sodium: 136 meq/L (ref 135–145)
TCO2: 25 mmol/L (ref 0–100)

## 2011-07-07 MED ORDER — HYDROCODONE-ACETAMINOPHEN 5-325 MG PO TABS
1.0000 | ORAL_TABLET | Freq: Once | ORAL | Status: AC
  Administered 2011-07-07: 1 via ORAL
  Filled 2011-07-07: qty 1

## 2011-07-07 MED ORDER — HYDROCODONE-ACETAMINOPHEN 5-325 MG PO TABS: ORAL_TABLET | ORAL | Status: DC

## 2011-07-07 NOTE — Discharge Instructions (Signed)
 I have made you an appointment with her regular doctor on December 5 at 2:45 PM. Please be sure to make her appointment for further evaluation of her symptoms. I have given you 2 days worth of Norco which is a mild narcotic. He can cause drowsiness and slow breathing so please use with caution. In general, chronic pain should be dealt with your regular doctor and should be avoided here in the emergency department. For future exacerbations of her chronic pain, please make an appointment with her regular physician.   Chronic Pain Discharge Instructions  Emergency care providers appreciate that many patients coming to us  are in severe pain and we wish to address their pain in the safest, most responsible manner.  It is important to recognize however, that the proper treatment of chronic pain differs from that of the pain of injuries and acute illnesses.  Our goal is to provide quality, safe, personalized care and we thank you for giving us  the opportunity to serve you. The use of narcotics and related agents for chronic pain syndromes may lead to additional physical and psychological problems.  Nearly as many people die from prescription narcotics each year as die from car crashes.  Additionally, this risk is increased if such prescriptions are obtained from a variety of sources.  Therefore, only your primary care physician or a pain management specialist is able to safely treat such syndromes with narcotic medications long-term.    Documentation revealing such prescriptions have been sought from multiple sources may prohibit us  from providing a refill or different narcotic medication.  Your name may be checked first through the [place]  Controlled Substances Reporting System.  This database is a record of controlled substance medication prescriptions that the patient has received.  This has been established by [facility]  in an effort to eliminate the dangerous, and often life threatening, practice of  obtaining multiple prescriptions from different medical providers.   If you have a chronic pain syndrome (i.e. chronic headaches, recurrent back or neck pain, dental pain, abdominal or pelvis pain without a specific diagnosis, or neuropathic pain such as fibromyalgia) or recurrent visits for the same condition without an acute diagnosis, you may be treated with non-narcotics and other non-addictive medicines.  Allergic reactions or negative side effects that may be reported by a patient to such medications will not typically lead to the use of a narcotic analgesic or other controlled substance as an alternative.   Patients managing chronic pain with a personal physician should have provisions in place for breakthrough pain.  If you are in crisis, you should call your physician.  If your physician directs you to the emergency department, please have the doctor call and speak to our attending physician concerning your care.   When patients come to the Emergency Department (ED) with acute medical conditions in which the Emergency Department physician feels appropriate to prescribe narcotic or sedating pain medication, the physician will prescribe these in very limited quantities.  The amount of these medications will last only until you can see your primary care physician in his/her office.  Any patient who returns to the ED seeking refills should expect only non-narcotic pain medications.   In the event of an acute medical condition exists and the emergency physician feels it is necessary that the patient be given a narcotic or sedating medication -  a responsible adult driver should be present in the room prior to the medication being given by the nurse.   Prescriptions for narcotic  or sedating medications that have been lost, stolen or expired will not be refilled in the Emergency Department.    Patients who have chronic pain may receive non-narcotic prescriptions until seen by their primary care  physician.  It is every patient's personal responsibility to maintain active prescriptions with his or her primary care physician or specialist.

## 2011-07-07 NOTE — ED Notes (Signed)
C/o headache , weakness, "swelling all over my arms and my legs." Increasing over past 2 weeks. Denies LOC. reprots mild SOB. A&Ox4, resp e/u
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
## 2011-07-13 ENCOUNTER — Encounter: Payer: Self-pay | Admitting: Internal Medicine

## 2011-07-13 ENCOUNTER — Ambulatory Visit (INDEPENDENT_AMBULATORY_CARE_PROVIDER_SITE_OTHER): Payer: Self-pay | Admitting: Internal Medicine

## 2011-07-13 VITALS — BP 136/89 | HR 72 | Temp 98.2°F | Wt 144.4 lb

## 2011-07-13 DIAGNOSIS — IMO0001 Reserved for inherently not codable concepts without codable children: Secondary | ICD-10-CM

## 2011-07-13 DIAGNOSIS — M2140 Flat foot [pes planus] (acquired), unspecified foot: Secondary | ICD-10-CM

## 2011-07-13 DIAGNOSIS — M797 Fibromyalgia: Secondary | ICD-10-CM

## 2011-07-13 MED ORDER — AMITRIPTYLINE HCL 25 MG PO TABS: 25.0000 mg | ORAL_TABLET | Freq: Every day | ORAL | Status: DC

## 2011-07-13 NOTE — Patient Instructions (Signed)
-  Please begin taking Amitriptyline 25 mg daily 30 minutes before bed. Please stop taking vicodin and lodin. Please keep a glass of water by your bed to take a few sips should you have dry mouth.    -If you have any new or worsening symptoms, please call the clinic at [phone]  -Please return in 1 month so we can see how you're doing.
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
## 2011-07-13 NOTE — Assessment & Plan Note (Signed)
I suspect symptoms may be 2/2 Fibromyalgia, especially given history of difficulty falling asleep & not feeling rested in the morning.  She did not improve with NSAIDs or PT.  While she does get relief with opiates, without an appropriate diagnosis, this is not a good long term plan. Hand XR in June 2012 did not suggest degenerative changes/OA.  Cervical MRI suggests mild disk protrusions at C7-T1 & T1-T2, but pain begins distally and there is no radiation from anywhere.  Pt cannot recall if she adequately tried amitriptyline in the past (though it is listed under medication history). Plan: Continue gabapentin Start Amitriptyline 25mg  qHS, may need to titrate up, pt instructed to call if in pain

## 2011-07-13 NOTE — Assessment & Plan Note (Signed)
This is likely why she has foot pain.  Unfortunately, she cannot financially afford to be seen by a podiatrist or pay for orthotics.

## 2011-07-14 NOTE — Progress Notes (Signed)
agree

## 2011-07-18 ENCOUNTER — Other Ambulatory Visit: Payer: Self-pay | Admitting: *Deleted

## 2011-07-18 DIAGNOSIS — M797 Fibromyalgia: Secondary | ICD-10-CM

## 2011-07-20 MED ORDER — GABAPENTIN 100 MG PO CAPS: 100.0000 mg | ORAL_CAPSULE | Freq: Three times a day (TID) | ORAL | Status: DC

## 2011-07-20 NOTE — Telephone Encounter (Signed)
Refill was faxed to the [facility].  Angelina Ok, RN 07/20/2011 3:16 PM

## 2011-07-21 ENCOUNTER — Telehealth: Payer: Self-pay | Admitting: *Deleted

## 2011-07-21 NOTE — Telephone Encounter (Signed)
Gabapentin 100mg  rx called to [facility] pharmacy.

## 2011-07-26 ENCOUNTER — Ambulatory Visit (INDEPENDENT_AMBULATORY_CARE_PROVIDER_SITE_OTHER): Payer: Self-pay | Admitting: Internal Medicine

## 2011-07-26 ENCOUNTER — Encounter: Payer: Self-pay | Admitting: Internal Medicine

## 2011-07-26 VITALS — BP 137/90 | HR 76 | Temp 96.5°F | Ht 63.0 in | Wt 145.1 lb

## 2011-07-26 DIAGNOSIS — M797 Fibromyalgia: Secondary | ICD-10-CM

## 2011-07-26 DIAGNOSIS — IMO0001 Reserved for inherently not codable concepts without codable children: Secondary | ICD-10-CM

## 2011-07-26 MED ORDER — AMITRIPTYLINE HCL 50 MG PO TABS: 50.0000 mg | ORAL_TABLET | Freq: Every day | ORAL | Status: DC

## 2011-07-26 NOTE — Patient Instructions (Signed)
Please increase her amitriptyline to 50 mg daily.

## 2011-07-26 NOTE — Progress Notes (Signed)
Subjective:     Patient ID: Lorraine [name], female   DOB: Nov 29, 1960, 50 y.o.   MRN: [id number]  HPI  Patient is a pleasant 50 year old female with a past medical history listed below, was seen in the clinic 2 weeks ago for fibromyalgia and was started on amitriptyline 25 mg at bedtime, patient returns for followup reports that she has not noticed amitriptyline working yet and also complains of right flank pain that is tender to palpation and likely muscle spasm in origin. Otherwise reports feeling well and denies any other complaints  Current Outpatient Prescriptions on File Prior to Visit  Medication Sig Dispense Refill  . amLODipine (NORVASC) 5 MG tablet Take 5 mg by mouth daily.        Marland Kitchen gabapentin (NEURONTIN) 100 MG capsule Take 1 capsule (100 mg total) by mouth 3 (three) times daily.  90 capsule  3  . Multiple Vitamins-Minerals (MULTIVITAMINS THER. W/MINERALS) TABS Take 1 tablet by mouth daily.        . quinapril-hydrochlorothiazide (ACCURETIC) 10-12.5 MG per tablet Take 1 tablet by mouth 2 (two) times daily.        Marland Kitchen DISCONTD: amLODipine (NORVASC) 5 MG tablet Take 1 tablet (5 mg total) by mouth daily.  90 tablet  4  . DISCONTD: quinapril-hydrochlorothiazide (ACCURETIC) 10-12.5 MG per tablet Take 2 tablets by mouth daily.  60 tablet  5   Patient Active Problem List  Diagnoses  . HEPATITIS C, CHRONIC VIRAL, W/O HEPATIC COMA  . TOBACCO USER  . HYPERTENSION  . DENTAL CARIES, SEVERE  . APHTHOUS ULCERS  . FIBROADENOSIS, BREAST  . LOW BACK PAIN, CHRONIC  . FIBROMYALGIA  . PES PLANUS  . TRANSAMINASES, SERUM, ELEVATED  . HEPATITIS B, HX OF  . Itching in the vaginal area  . Painful rib  . Swelling of right thumb  . Right hand pain  . Cervical neck pain with evidence of disc disease  . DJD (degenerative joint disease)   No Known Allergies     Review of Systems  Musculoskeletal: Positive for myalgias and arthralgias.  All other systems reviewed and are negative.         Objective:   Physical Exam  Nursing note and vitals reviewed. Constitutional: She is oriented to person, place, and time. She appears well-developed and well-nourished.  HENT:  Head: Normocephalic and atraumatic.  Eyes: Pupils are equal, round, and reactive to light.  Neck: Normal range of motion. Neck supple. No JVD present. No thyromegaly present.  Cardiovascular: Normal rate, regular rhythm and normal heart sounds.   No murmur heard. Pulmonary/Chest: Effort normal and breath sounds normal. She has no wheezes. She has no rales.  Abdominal: Soft. Bowel sounds are normal.  Musculoskeletal: Normal range of motion. She exhibits no edema.  Neurological: She is alert and oriented to person, place, and time.  Skin: Skin is warm and dry.

## 2011-07-26 NOTE — Assessment & Plan Note (Signed)
On last followup 2 weeks ago patient was started on amitriptyline 25 mg at bedtime,on top of her gabapentin, I have informed the patient of this is too early to see if amitriptyline has worked or not, however given that she has tolerated amitriptyline well have increased it to 50 mg at bedtime. And advised the patient to followup in one month. Will also check UA for this right flank pain incase its a UTI rather than a component of her fibromyalgia.

## 2011-07-27 LAB — URINALYSIS, ROUTINE W REFLEX MICROSCOPIC
Bilirubin Urine: NEGATIVE
Glucose, UA: NEGATIVE mg/dL
Protein, ur: NEGATIVE mg/dL
pH: 6 (ref 5.0–8.0)

## 2011-08-17 ENCOUNTER — Encounter: Payer: Self-pay | Admitting: Internal Medicine

## 2011-08-18 ENCOUNTER — Ambulatory Visit (INDEPENDENT_AMBULATORY_CARE_PROVIDER_SITE_OTHER): Payer: Self-pay | Admitting: Internal Medicine

## 2011-08-18 ENCOUNTER — Encounter (HOSPITAL_COMMUNITY): Payer: Self-pay | Admitting: Emergency Medicine

## 2011-08-18 ENCOUNTER — Encounter: Payer: Self-pay | Admitting: Internal Medicine

## 2011-08-18 ENCOUNTER — Emergency Department (HOSPITAL_COMMUNITY)
Admission: EM | Admit: 2011-08-18 | Discharge: 2011-08-19 | Disposition: A | Discharge status: Home / Self Care | Payer: Self-pay | Attending: Emergency Medicine | Admitting: Emergency Medicine

## 2011-08-18 DIAGNOSIS — R81 Glycosuria: Secondary | ICD-10-CM

## 2011-08-18 DIAGNOSIS — E1169 Type 2 diabetes mellitus with other specified complication: Secondary | ICD-10-CM | POA: Insufficient documentation

## 2011-08-18 DIAGNOSIS — R739 Hyperglycemia, unspecified: Secondary | ICD-10-CM

## 2011-08-18 DIAGNOSIS — I10 Essential (primary) hypertension: Secondary | ICD-10-CM

## 2011-08-18 DIAGNOSIS — F17200 Nicotine dependence, unspecified, uncomplicated: Secondary | ICD-10-CM | POA: Insufficient documentation

## 2011-08-18 DIAGNOSIS — R7309 Other abnormal glucose: Secondary | ICD-10-CM

## 2011-08-18 DIAGNOSIS — E119 Type 2 diabetes mellitus without complications: Secondary | ICD-10-CM | POA: Insufficient documentation

## 2011-08-18 DIAGNOSIS — B182 Chronic viral hepatitis C: Secondary | ICD-10-CM | POA: Insufficient documentation

## 2011-08-18 DIAGNOSIS — N898 Other specified noninflammatory disorders of vagina: Secondary | ICD-10-CM | POA: Insufficient documentation

## 2011-08-18 DIAGNOSIS — R300 Dysuria: Secondary | ICD-10-CM | POA: Insufficient documentation

## 2011-08-18 LAB — HEPATIC FUNCTION PANEL
ALT: 40 U/L — ABNORMAL HIGH (ref 0–35)
AST: 195 U/L — ABNORMAL HIGH (ref 0–37)
Albumin: 3.7 g/dL (ref 3.5–5.2)
Bilirubin, Direct: <0.1 mg/dL (ref 0.0–0.3)
Total Bilirubin: 0.3 mg/dL (ref 0.3–1.2)

## 2011-08-18 LAB — BASIC METABOLIC PANEL
Chloride: 88 mEq/L — ABNORMAL LOW (ref 96–112)
Potassium: 3.8 mEq/L (ref 3.5–5.3)
Sodium: 128 mEq/L — ABNORMAL LOW (ref 135–145)

## 2011-08-18 LAB — POCT URINALYSIS DIPSTICK
Bilirubin, UA: NEGATIVE
Nitrite, UA: NEGATIVE
Urobilinogen, UA: 0.2

## 2011-08-18 LAB — GLUCOSE, CAPILLARY

## 2011-08-18 LAB — LIPID PANEL
Cholesterol: 175 mg/dL (ref 0–200)
HDL: 42 mg/dL (ref >39)
Triglycerides: 438 mg/dL — ABNORMAL HIGH (ref <150)

## 2011-08-18 LAB — POCT GLYCOSYLATED HEMOGLOBIN (HGB A1C): Hemoglobin A1C: 10.2

## 2011-08-18 MED ORDER — INSULIN REGULAR HUMAN 100 UNIT/ML IJ SOLN: 10.0000 [IU] | Freq: Once | INTRAMUSCULAR | Status: DC

## 2011-08-18 MED ORDER — METFORMIN HCL 500 MG PO TABS: 500.0000 mg | ORAL_TABLET | Freq: Two times a day (BID) | ORAL | Status: DC

## 2011-08-18 MED ORDER — GLIPIZIDE 5 MG PO TABS: 5.0000 mg | ORAL_TABLET | Freq: Two times a day (BID) | ORAL | Status: DC

## 2011-08-18 MED ORDER — INSULIN ASPART 100 UNIT/ML ~~LOC~~ SOLN: 10.0000 [IU] | Freq: Once | SUBCUTANEOUS | Status: DC

## 2011-08-18 MED ORDER — SODIUM CHLORIDE 0.9 % IV BOLUS (SEPSIS)
1000.0000 mL | Freq: Once | INTRAVENOUS | Status: AC
  Administered 2011-08-19: 1000 mL via INTRAVENOUS
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
## 2011-08-18 NOTE — ED Notes (Signed)
Pt presented to the ER with c/o dizziness and increase thirst secondary to hyperglycemia, pt states she was has been seen today at her PCP and was Dx with DM, around 3pm BG was above 500. Pt state that at that time she was not given anything, pt presented with Rx medications, states took what they told her, however still c/o feeling weak, sweaty, and lightheaded.

## 2011-08-19 ENCOUNTER — Telehealth: Payer: Self-pay | Admitting: *Deleted

## 2011-08-19 LAB — WET PREP BY MOLECULAR PROBE
Candida species: POSITIVE — AB
Trichomonas vaginosis: NEGATIVE

## 2011-08-19 LAB — CBC
HCT: 38 % (ref 36.0–46.0)
Hemoglobin: 13.7 g/dL (ref 12.0–15.0)
MCH: 31.1 pg (ref 26.0–34.0)
MCHC: 36.1 g/dL — ABNORMAL HIGH (ref 30.0–36.0)
MCV: 86.4 fL (ref 78.0–100.0)
Platelets: 252 10*3/uL (ref 150–400)
RBC: 4.4 MIL/uL (ref 3.87–5.11)
RDW: 12.2 % (ref 11.5–15.5)
WBC: 9.5 10*3/uL (ref 4.0–10.5)

## 2011-08-19 LAB — DIFFERENTIAL
Basophils Absolute: 0 10*3/uL (ref 0.0–0.1)
Basophils Relative: 0 % (ref 0–1)
Eosinophils Absolute: 0.2 10*3/uL (ref 0.0–0.7)
Eosinophils Relative: 2 % (ref 0–5)
Lymphocytes Relative: 39 % (ref 12–46)
Lymphs Abs: 3.7 10*3/uL (ref 0.7–4.0)
Monocytes Absolute: 0.6 10*3/uL (ref 0.1–1.0)
Monocytes Relative: 6 % (ref 3–12)
Neutro Abs: 5 10*3/uL (ref 1.7–7.7)
Neutrophils Relative %: 52 % (ref 43–77)

## 2011-08-19 LAB — BASIC METABOLIC PANEL WITH GFR
BUN: 10 mg/dL (ref 6–23)
CO2: 21 meq/L (ref 19–32)
Calcium: 8.2 mg/dL — ABNORMAL LOW (ref 8.4–10.5)
Chloride: 96 meq/L (ref 96–112)
Creatinine, Ser: 0.49 mg/dL — ABNORMAL LOW (ref 0.50–1.10)
GFR calc Af Amer: >90 mL/min
GFR calc non Af Amer: >90 mL/min
Glucose, Bld: 306 mg/dL — ABNORMAL HIGH (ref 70–99)
Potassium: 3.2 meq/L — ABNORMAL LOW (ref 3.5–5.1)
Sodium: 129 meq/L — ABNORMAL LOW (ref 135–145)

## 2011-08-19 LAB — GLUCOSE, CAPILLARY: Glucose-Capillary: 303 mg/dL — ABNORMAL HIGH (ref 70–99)

## 2011-08-19 MED ORDER — FLUCONAZOLE 150 MG PO TABS: 150.0000 mg | ORAL_TABLET | Freq: Once | ORAL | Status: DC

## 2011-08-19 MED ORDER — IBUPROFEN 800 MG PO TABS
800.0000 mg | ORAL_TABLET | Freq: Once | ORAL | Status: AC
  Administered 2011-08-19: 800 mg via ORAL
  Filled 2011-08-19: qty 1

## 2011-08-19 MED ORDER — SODIUM CHLORIDE 0.9 % IV BOLUS (SEPSIS)
1000.0000 mL | Freq: Once | INTRAVENOUS | Status: AC
  Administered 2011-08-19: 1000 mL via INTRAVENOUS

## 2011-08-19 MED ORDER — FLUCONAZOLE 150 MG PO TABS
150.0000 mg | ORAL_TABLET | Freq: Once | ORAL | Status: AC
  Administered 2011-08-19: 150 mg via ORAL
  Filled 2011-08-19: qty 1

## 2011-08-19 MED ORDER — METRONIDAZOLE 500 MG PO TABS: 500.0000 mg | ORAL_TABLET | Freq: Two times a day (BID) | ORAL | Status: DC

## 2011-08-19 NOTE — Assessment & Plan Note (Addendum)
Patient was newly diagnosed with diabetes with a hemoglobin A1c of 10.2. Her polydipsia polyuria and vision are likely related to hyperglycemia with CBG of more than 500. A stat bmet was obtained which showed mild anion and gap acidosis again likely due to hyperglycemia. After discussion with Dr. Josem Kaufmann we decided patient to start on glipizide 5 mg twice a day and metformin 500 mg. Furthermore advised the patient to continue to take fluid intake. She denied any nausea vomiting was able to keep up fluid. Therefore it is decided to monitor the patient as an outpatient and have close followup with the clinic. I informed the patient if she is experiencing worsening symptoms she should call the clinic for further advise or go to the emergency room. I initiated the discussion about diabetes and what it means the patient was somewhat overwhelmed. I will refer this to the next office visit. Furthermore I were for patient to the diabetes educator in the clinic. Patient likely needs insulin with an A1c of 10.2 but again will defer to later. Will obtain lipid panel today. Consider to start therapy during the next office visit after discussion with the patient . I will obtained liver function test  Since patient is started on metformin.  She should followup with an ophthalmologist.

## 2011-08-19 NOTE — Assessment & Plan Note (Signed)
Likely do to hyperglycemia. I'll send urine for urine culture since few leukocytes were noted for possible changes in management.

## 2011-08-19 NOTE — ED Notes (Signed)
Patient stable upon discharge.  

## 2011-08-19 NOTE — Telephone Encounter (Signed)
I would advise the patient to continue her medication and continue to drink. I advised the patient to avoid any sodas or sweet food. Her blurry vision will be persistent for some time since her blood sugars were about 500. But considering her hemoglobin A1c of 10.2 her average blood sugar is at least 280 therefore a CBG of 350 is not concerning at this point. Patient should keep her appointment on Tuesday the 15th 2012 at outpatient clinic for a followup.

## 2011-08-19 NOTE — ED Notes (Signed)
Patient educated thoroughly on diabetic diet, exercise, foot care, kidneys, heart, and cbg checks, weight monitoring, etc.
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
## 2011-08-19 NOTE — ED Provider Notes (Signed)
Medical screening examination/treatment/procedure(s) were performed by non-physician practitioner and as supervising physician I was immediately available for consultation/collaboration.   Hanley Seamen, MD 08/19/11 [phone]

## 2011-08-19 NOTE — Assessment & Plan Note (Signed)
Wet prep was obtained which showed candidiasis and Gardnerella. Prescription for Diflucan 150 mg one tablet once and Flagyl 500 mg twice a day for 10 days was called into [facility] on [address] on 08/18/2010

## 2011-08-19 NOTE — Assessment & Plan Note (Signed)
After repeating patient's blood pressure improved. Continue current regimen. Patient has currently no diabetic retinopathy or proteinuria therefore her goal of blood pressure should be less than 140/90. If she is found to have either retinopathy or proteinuria her blood pressure goal should be 130/80  BP Readings from Last 3 Encounters:     08/18/11 126/88  07/26/11 137/90

## 2011-08-19 NOTE — Telephone Encounter (Signed)
Call from pt's daughter @ [phone] stating pt was seen in ED last night and was given 3 liters of fluid.  CBG's were in the 300 range.  Today CBG still running @ 350,  pt is tired and c/o blurred vision and thirst. Pt started both meds last night and one dose today. She has a f/u here on 1/16.    Please advise

## 2011-08-19 NOTE — Patient Instructions (Signed)
1. please drink a lot of fluids but no so sodas or any juices since that can increase her sugars. 2. do not eat any sweets things until we see you 3. please call the clinic if he have worsening symptoms or otherwise go to the emergency room if the clinic is not available.

## 2011-08-20 ENCOUNTER — Encounter (HOSPITAL_COMMUNITY): Payer: Self-pay | Admitting: *Deleted

## 2011-08-20 ENCOUNTER — Emergency Department (HOSPITAL_COMMUNITY)
Admission: EM | Admit: 2011-08-20 | Discharge: 2011-08-21 | Disposition: A | Discharge status: Home / Self Care | Payer: Self-pay | Attending: Emergency Medicine | Admitting: Emergency Medicine

## 2011-08-20 DIAGNOSIS — I10 Essential (primary) hypertension: Secondary | ICD-10-CM | POA: Insufficient documentation

## 2011-08-20 DIAGNOSIS — E119 Type 2 diabetes mellitus without complications: Secondary | ICD-10-CM | POA: Insufficient documentation

## 2011-08-20 DIAGNOSIS — F17200 Nicotine dependence, unspecified, uncomplicated: Secondary | ICD-10-CM | POA: Insufficient documentation

## 2011-08-20 DIAGNOSIS — E876 Hypokalemia: Secondary | ICD-10-CM | POA: Insufficient documentation

## 2011-08-20 DIAGNOSIS — Z8619 Personal history of other infectious and parasitic diseases: Secondary | ICD-10-CM | POA: Insufficient documentation

## 2011-08-20 DIAGNOSIS — R739 Hyperglycemia, unspecified: Secondary | ICD-10-CM

## 2011-08-20 DIAGNOSIS — R350 Frequency of micturition: Secondary | ICD-10-CM | POA: Insufficient documentation

## 2011-08-20 LAB — CBC
Hemoglobin: 15.6 g/dL — ABNORMAL HIGH (ref 12.0–15.0)
MCHC: 37 g/dL — ABNORMAL HIGH (ref 30.0–36.0)
RBC: 4.92 MIL/uL (ref 3.87–5.11)
WBC: 8.2 10*3/uL (ref 4.0–10.5)

## 2011-08-20 LAB — COMPREHENSIVE METABOLIC PANEL
ALT: 49 U/L — ABNORMAL HIGH (ref 0–35)
Albumin: 3.7 g/dL (ref 3.5–5.2)
Alkaline Phosphatase: 90 U/L (ref 39–117)
Chloride: 86 mEq/L — ABNORMAL LOW (ref 96–112)
GFR calc Af Amer: >90 mL/min (ref >90)
Glucose, Bld: 441 mg/dL — ABNORMAL HIGH (ref 70–99)
Potassium: 3.2 mEq/L — ABNORMAL LOW (ref 3.5–5.1)
Sodium: 123 mEq/L — ABNORMAL LOW (ref 135–145)
Total Protein: 8.8 g/dL — ABNORMAL HIGH (ref 6.0–8.3)

## 2011-08-20 LAB — URINE CULTURE: Colony Count: >=100000

## 2011-08-20 LAB — GLUCOSE, CAPILLARY: Glucose-Capillary: 502 mg/dL — ABNORMAL HIGH (ref 70–99)

## 2011-08-20 MED ORDER — SODIUM CHLORIDE 0.9 % IV BOLUS (SEPSIS)
1000.0000 mL | Freq: Once | INTRAVENOUS | Status: AC
  Administered 2011-08-20: 1000 mL via INTRAVENOUS

## 2011-08-20 MED ORDER — INSULIN ASPART PROT & ASPART (70-30 MIX) 100 UNIT/ML ~~LOC~~ SUSP
5.0000 [IU] | Freq: Once | SUBCUTANEOUS | Status: DC
  Filled 2011-08-20: qty 3

## 2011-08-20 NOTE — Progress Notes (Signed)
Patient ID: Lorraine [name], female   DOB: 1961/01/19, 51 y.o.   MRN: [id number]  We were contacted by one of the emergency room providers at [facility], in regards to Lorraine [name], and she presented ED with symptoms of nausea and was found to have a CBG of more than 500, hypokalemia and pseudo-hyponatremia, note that the patient is a newly diagnosed diabetic who is recently seen in the clinic for similar presentation and was started on metformin and glipizide, however despite taking these medications patient's sugars remain uncontrolled. After chart review we decided to start the patient on insulin 70/30 starting at 5 units twice daily. Replete potassium, and plan to followup the patient at the earliest opening in the clinic for close followup and adjustment of her insulin.
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
## 2011-08-20 NOTE — ED Notes (Signed)
Pt was diagnosed x 2 days ago with diabetes.  Pt presents tonight with hyperglycemia.  Pt states she has been taking her medication as prescribed and noted her CBG to be increased regardless.  Pt c/o headaches and nausea as well as foot and leg pain.

## 2011-08-21 MED ORDER — INSULIN NPH ISOPHANE & REGULAR (70-30) 100 UNIT/ML ~~LOC~~ SUSP: 5.0000 [IU] | Freq: Two times a day (BID) | SUBCUTANEOUS | Status: DC

## 2011-08-21 MED ORDER — POTASSIUM CHLORIDE CRYS ER 20 MEQ PO TBCR
40.0000 meq | EXTENDED_RELEASE_TABLET | Freq: Once | ORAL | Status: AC
  Administered 2011-08-21: 40 meq via ORAL
  Filled 2011-08-21: qty 2

## 2011-08-21 MED ORDER — INSULIN ASPART PROT & ASPART (70-30 MIX) 100 UNIT/ML ~~LOC~~ SUSP: 5.0000 [IU] | Freq: Two times a day (BID) | SUBCUTANEOUS | Status: DC

## 2011-08-21 MED ORDER — INSULIN ASPART PROT & ASPART (70-30 MIX) 100 UNIT/ML ~~LOC~~ SUSP
5.0000 [IU] | Freq: Once | SUBCUTANEOUS | Status: AC
  Administered 2011-08-21: 5 [IU] via SUBCUTANEOUS

## 2011-08-21 MED ORDER — POTASSIUM CHLORIDE ER 10 MEQ PO TBCR: 20.0000 meq | EXTENDED_RELEASE_TABLET | Freq: Every day | ORAL | Status: DC

## 2011-08-21 NOTE — ED Notes (Signed)
Rx was given to pt. Pt was taught how to use a flex pen. Pt demonstrated and verbalized she understanding

## 2011-08-22 ENCOUNTER — Telehealth: Payer: Self-pay | Admitting: Dietician

## 2011-08-22 ENCOUNTER — Ambulatory Visit (INDEPENDENT_AMBULATORY_CARE_PROVIDER_SITE_OTHER): Payer: Self-pay | Admitting: Dietician

## 2011-08-22 ENCOUNTER — Encounter (HOSPITAL_COMMUNITY): Payer: Self-pay | Admitting: *Deleted

## 2011-08-22 ENCOUNTER — Encounter: Payer: Self-pay | Admitting: Dietician

## 2011-08-22 ENCOUNTER — Emergency Department (HOSPITAL_COMMUNITY)
Admission: EM | Admit: 2011-08-22 | Discharge: 2011-08-22 | Disposition: A | Discharge status: Home / Self Care | Payer: Self-pay | Attending: Emergency Medicine | Admitting: Emergency Medicine

## 2011-08-22 DIAGNOSIS — Z794 Long term (current) use of insulin: Secondary | ICD-10-CM | POA: Insufficient documentation

## 2011-08-22 DIAGNOSIS — E119 Type 2 diabetes mellitus without complications: Secondary | ICD-10-CM

## 2011-08-22 DIAGNOSIS — I10 Essential (primary) hypertension: Secondary | ICD-10-CM | POA: Insufficient documentation

## 2011-08-22 DIAGNOSIS — Z8619 Personal history of other infectious and parasitic diseases: Secondary | ICD-10-CM | POA: Insufficient documentation

## 2011-08-22 LAB — POCT I-STAT, CHEM 8
BUN: 7 mg/dL (ref 6–23)
Calcium, Ion: 1.18 mmol/L (ref 1.12–1.32)
Chloride: 96 mEq/L (ref 96–112)
HCT: 44 % (ref 36.0–46.0)
Potassium: 3.4 mEq/L — ABNORMAL LOW (ref 3.5–5.1)
Sodium: 131 mEq/L — ABNORMAL LOW (ref 135–145)

## 2011-08-22 LAB — URINALYSIS, ROUTINE W REFLEX MICROSCOPIC
Glucose, UA: >1000 mg/dL — AB
Leukocytes, UA: NEGATIVE
Nitrite: NEGATIVE
Specific Gravity, Urine: 1.023 (ref 1.005–1.030)
pH: 6 (ref 5.0–8.0)

## 2011-08-22 LAB — URINE MICROSCOPIC-ADD ON

## 2011-08-22 LAB — GLUCOSE, CAPILLARY: Glucose-Capillary: 372 mg/dL — ABNORMAL HIGH (ref 70–99)

## 2011-08-22 MED ORDER — INSULIN ASPART 100 UNIT/ML ~~LOC~~ SOLN
10.0000 [IU] | Freq: Once | SUBCUTANEOUS | Status: AC
  Administered 2011-08-22: 10 [IU] via SUBCUTANEOUS
  Filled 2011-08-22: qty 1

## 2011-08-22 MED ORDER — SODIUM CHLORIDE 0.9 % IV BOLUS (SEPSIS)
1000.0000 mL | Freq: Once | INTRAVENOUS | Status: AC
  Administered 2011-08-22: 1000 mL via INTRAVENOUS

## 2011-08-22 NOTE — Patient Instructions (Addendum)
Please STOP taking glipizide until we see you tomorrow.  Tonight and tomorrow morning: Take 10 units insulin Tonight before dinner and tomorrow before breakfast: 1.Take cap off pen. 2. Rock and roll pen 10 times 3. Wipe top of pen with alcohol. 4. attach new pen needle 5. Do airshot: dial in 2 units and push plunger all the way back to 0 (zero). 6. Wipe skin where you are going to inject insulin (stomach). 7. Inject 10 units insulin under the skin: push    Eat 15 minutes after insulin injection. All  Starches and sugars increase your blood sugar: try to keep portions small.  Call office if you have nay questions [phone]  If an emergency call office main number [phone] for after hours assistance - message will give you a way to contact the after hours physician

## 2011-08-22 NOTE — ED Notes (Signed)
CBG:456 

## 2011-08-22 NOTE — Telephone Encounter (Signed)
Called patient to introduce myself and arrange an appointment for diabetes. Currently she has an appointment today and on 08/23/11 with Certified Diabetes Educator(writer). She tells me she is in the [place] now and cannot make an appintment here today. Encouraged her to discuss this with the ER physician, but patient repeated that she could come tomorrow at 1:30 PM and not today.  Encouraged her to call us as needed, including the after hours physician.

## 2011-08-22 NOTE — Progress Notes (Signed)
Diabetes Self-Management Training (DSMT)  Initial Visit  08/22/2011 [name], identified by name and date of birth, is a 51 y.o. female with Type 2 Diabetes. Year of diabetes diagnosis: since 08/19/11 Other persons present: family member patient, daughter and grandaughter who patient asked to wait in waiting room  ASSESSMENT Patient concerns are Glycemic control and headache, confusion about how to care of diabetes.  There were no vitals taken for this visit. There is no height or weight on file to calculate BMI. Lab Results  Component Value Date   LDLCALC Comment:   Not calculated due to Triglyceride >400. Suggest ordering Direct LDL (Unit Code: 16109).   Total Cholesterol/HDL Ratio:CHD Risk                        Coronary Heart Disease Risk Table                                        Men       Women          1/2 Average Risk              3.4        3.3              Average Risk              5.0        4.4           2X Average Risk              9.6        7.1           3X Average Risk             23.4       11.0 Use the calculated Patient Ratio above and the CHD Risk table  to determine the patient's CHD Risk. ATP III Classification (LDL):       < 100        mg/dL         Optimal      604 - 129     mg/dL         Near or Above Optimal      130 - 159     mg/dL         Borderline High      160 - 189     mg/dL         High       > 540        mg/dL         Very High   9/81/1914   Lab Results  Component Value Date   HGBA1C 10.2 08/18/2011   Medication Nutrition Monitor: [employer] reliON  Labs reviewed.  DIABETES BUNDLE: A1C in past 6 months? Yes.  Less than 7%? No LDL in past year? Yes.  Less than 100 mg/dL? Not aclculated Microalbumin ratio in past year? No. Patient taking ACE or ARB? Yes. Blood pressure less than 130/80? Yes. Foot exam in last year? No. Sent note to MD. Eye exam in past year? No.  Reminded patient to schedule eye exam. Tobacco use? Yes.  Smoking cessation offered?  No Pneumovax? No Flu vaccine? Yes Asprin? No  Support systems: two daughter Special needs: Simplified materials, many repetitions of instructions3 Prior DM Education:  Yes Patients belief/attitude about diabetes: Diabetes can be controlled. Self foot exams daily: No Diabetes Complications: None   Medications See Medications list.  Is interested in learning more and Needs skills/knowledge review    Exercise Plan Doing not assesses today .   Self-Monitoring Frequency of testing: 2 times/day Breakfast which is around lunchtime: checking after meals is 200-450 Supper: 200-400   Hyperglycemia: Yes Daily Hypoglycemia: Yes Rarely today in office   Meal Planning Interested in improving and very little knowledge   Assessment comments: patient seems very anxious about her diagnosis and self care.  Needs much repetition and encouragement. Need to further assess ongoing substance abuse at future visits.    INDIVIDUAL DIABETES EDUCATION PLAN:  Diabetes Disease process Meal planning Medication Monitoring Goal setting Physical activity Healthy Coping  _______________________________________________________________________  Intervention TOPICS COVERED TODAY:  Medication  Taught/evaluated insulin injection, site rotation, insulin storage and needle disposal. Reviewed patients medication for diabetes, action, purpose, timing of dose and side effects. Monitoring  Purpose and frequency of SMBG. Taught/discussed recording of test results and interpretation of SMBG.  PATIENTS GOALS/PLAN (copy and paste in patient instructions so patient receives a copy): 1.  Learning Objective:       demonstrate how to use insulin pen and when to take injections 2.  Behavioral Objective:         Medications: To improve blood glucose levels, I will take my medication as prescribed Half of the time 50% Monitoring: To identify blood glucose trends, I will test my blood glucose 2x day Most of the time  75%  Personalized Follow-Up Plan for Ongoing Self Management Support:  Doctor's Office, family and CDE visits ______________________________________________________________________   Outcomes Expected outcomes: Demonstrated interest in learning.Expect positive changes in lifestyle.  Self-care Barriers: Unable to determine, Lack of transportation, Lack of material resources, Substance Abuse, Coping skills  Education material provided: yes- diabetes care kit that included: Glucose tablets, information about hypoglycemia, how to use insulin pen, injection sites, meal planning  Patient to contact team via Phone if problems or questions. Time in: 1530     Time out: 1630 Future DSMT - tomorrow   Lorraine [name]          Addendum: Discussed case with Dr. Coralee Pesa who agreed to increase in Novolog Mix 70/30 insulin to 10 units before breakfast and dinner 2x daily and hold glipizide today and tomorrow morning in light of blood sugar today in office of 89 at 1530 and 67 at 1630. Patient ate 1 tube glucose gel and was given glucose tablets to take with her. Instructed to check blood sugar before dinner.

## 2011-08-22 NOTE — ED Notes (Signed)
Pt was d/c'd from [facility] Saturday, CBG high this a.m.

## 2011-08-22 NOTE — Telephone Encounter (Signed)
Patient called back and says she will attend appointment today.

## 2011-08-22 NOTE — ED Provider Notes (Signed)
Medical screening examination/treatment/procedure(s) were performed by non-physician practitioner and as supervising physician I was immediately available for consultation/collaboration.   Mellody Masri M Karolee Meloni, DO 08/22/11 1305 
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
## 2011-08-23 ENCOUNTER — Encounter: Payer: Self-pay | Admitting: Internal Medicine

## 2011-08-23 ENCOUNTER — Ambulatory Visit (INDEPENDENT_AMBULATORY_CARE_PROVIDER_SITE_OTHER): Payer: Self-pay | Admitting: Internal Medicine

## 2011-08-23 ENCOUNTER — Ambulatory Visit (INDEPENDENT_AMBULATORY_CARE_PROVIDER_SITE_OTHER): Payer: Self-pay | Admitting: Dietician

## 2011-08-23 DIAGNOSIS — I10 Essential (primary) hypertension: Secondary | ICD-10-CM

## 2011-08-23 DIAGNOSIS — Z23 Encounter for immunization: Secondary | ICD-10-CM

## 2011-08-23 DIAGNOSIS — E781 Pure hyperglyceridemia: Secondary | ICD-10-CM | POA: Insufficient documentation

## 2011-08-23 DIAGNOSIS — R300 Dysuria: Secondary | ICD-10-CM

## 2011-08-23 DIAGNOSIS — E119 Type 2 diabetes mellitus without complications: Secondary | ICD-10-CM

## 2011-08-23 MED ORDER — INSULIN ASPART PROT & ASPART (70-30 MIX) 100 UNIT/ML ~~LOC~~ SUSP: 10.0000 [IU] | Freq: Two times a day (BID) | SUBCUTANEOUS | Status: DC

## 2011-08-23 MED ORDER — PRAVASTATIN SODIUM 40 MG PO TABS: 40.0000 mg | ORAL_TABLET | Freq: Every evening | ORAL | Status: DC

## 2011-08-23 MED ORDER — METRONIDAZOLE 500 MG PO TABS: 500.0000 mg | ORAL_TABLET | Freq: Two times a day (BID) | ORAL | Status: AC

## 2011-08-23 MED ORDER — FLUCONAZOLE 150 MG PO TABS: 150.0000 mg | ORAL_TABLET | Freq: Once | ORAL | Status: DC

## 2011-08-23 NOTE — Patient Instructions (Signed)
1. please followup in one week with [name] Plyler 2. he states your insulin 10 units twice a day. If you have any questions please call the clinic 3. You are started on a new cholesterol medication: Pravastatin. 4. Please pick up her prescription for Diflucan and Flagyl at [facility] 5. Please stop Glipizide

## 2011-08-23 NOTE — Assessment & Plan Note (Addendum)
Will start patient on insulin therapy with NovoLog 70/30 10 units twice a day. Furthermore patient would continue her metformin 500 mg twice a day. I would discontinue glipizide at this point. Patient was educated by [name] to check his her blood sugars are regular basis and to bring in the meter to next office visit. She was see Norm Parcel in one week. I recommended to call the clinic if she has any questions at all and not to go to the emergency room for blood sugars in the 300. I explained to her that her blood sugars has been around 300 since the last 3 months. At this point with the insulin at the metformin will continue to control her blood sugars as much as possible. At the same time I informed her if she experience any nausea or vomiting, increased urinary frequency and dysuria she called the clinic for further advice. It seems patient has difficulties to understand her new diagnosis. A lot of education would be needed. I highly appreciate [name] Plyler's assistance with this patient.  Refer patient to ophthalmology   Foot exam performed today.

## 2011-08-23 NOTE — Assessment & Plan Note (Signed)
Blood pressure well controlled. Continue current regimen.  

## 2011-08-23 NOTE — Assessment & Plan Note (Signed)
Since triglycerides are less than 500 the recommendation for up to date is to start statin. Will start pravastatin 40 mg daily. Reevaluate patient in one month to recheck liver function.

## 2011-08-23 NOTE — Progress Notes (Signed)
Novolog Mix 70/30 Flexpen and Pravastatin rxs called to [facility].

## 2011-08-23 NOTE — Assessment & Plan Note (Signed)
Was noted to have Candida and bacterial vaginosis and was prescribed Diflucan and Flagyl which was called in on Friday to [facility]. Patient did not pick up her prescription. Patient noted that she continues to have some burning sensation. Recommended to pick up the prescription as soon as possible

## 2011-08-23 NOTE — Patient Instructions (Signed)
You are doing great at caring for your diabetes! Please make an appointment to see me next week.   Please try to eat 2-3 portions of carbs (starches and sugars) at each meal: Carbs are foods that make your blood sugar go up!  Instructions for using insulin pen:  1.Take cap off pen.  2. Rock and roll pen 10 times  3. Wipe top of pen with alcohol.  4. attach new pen needle  5. Dial in 2 units and push plunger all the way back to 0 (zero). (This is called a SAFETY test or AIRSHOT) 6. Wipe skin where you are going to inject insulin (stomach).  7. Dial in 10 units and Inject 10 units insulin under the skin. 8. Push plunger down all the way back to 0 again(zero) again.  9. Remove the pen needle. 10. Put pen needle in hard plastic container that has a lid that screws on.
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
## 2011-08-23 NOTE — Progress Notes (Signed)
Diabetes Self-Management Training (DSMT)  Initial Visit  08/23/2011 [name], identified by name and date of birth, is a 51 y.o. female with Type 2 Diabetes. Year of diabetes diagnosis: since 08/19/11 Other persons present: no   ASSESSMENT Patient concerns are Glycemic control and headache, confusion about how to care of diabetes.  Height 5\' 3"  (1.6 m), weight 142 lb 6.4 oz (64.592 kg). Body mass index is 25.22 kg/(m^2). Lab Results  Component Value Date   LDLCALC Comment:   Not calculated due to Triglyceride >400. Suggest ordering Direct LDL (Unit Code: 47829).   Total Cholesterol/HDL Ratio:CHD Risk                        Coronary Heart Disease Risk Table                                        Men       Women          1/2 Average Risk              3.4        3.3              Average Risk              5.0        4.4           2X Average Risk              9.6        7.1           3X Average Risk             23.4       11.0 Use the calculated Patient Ratio above and the CHD Risk table  to determine the patient's CHD Risk. ATP III Classification (LDL):       < 100        mg/dL         Optimal      562 - 129     mg/dL         Near or Above Optimal      130 - 159     mg/dL         Borderline High      160 - 189     mg/dL         High       > 130        mg/dL         Very High   8/65/7846   Lab Results  Component Value Date   HGBA1C 10.2 08/18/2011   Medication Nutrition Monitor: [employer] reliON  Labs reviewed.  DIABETES BUNDLE: A1C in past 6 months? Yes.  Less than 7%? No LDL in past year? Yes.  Less than 100 mg/dL? Not aclculated Microalbumin ratio in past year? No. Patient taking ACE or ARB? Yes. Blood pressure less than 130/80? Yes. Foot exam in last year? No. Sent note to MD. Eye exam in past year? No.  Reminded patient to schedule eye exam. Tobacco use? Yes.  Smoking cessation offered? No Pneumovax? No Flu vaccine? Yes Asprin? No  Support systems: two daughters Special needs:  Simplified materials, Living with Diabetes picture book, many repetitions of instructions Prior DM Education: Yes Patients belief/attitude about diabetes: Diabetes can be  controlled. Self foot exams daily: No Diabetes Complications: None   Medications See Medications list. Feel patient would be best if kept on Insulin pen and she should be maintained on insulin as her weight is and has been at a healthy amount. Her insulin pen has ~ 252 units in it today. Her calculated Total daily dose is ~ 32 units a day. Suspect she will need to increase her morning insulin in the near future. Is interested in learning more and Needs skills/knowledge review.    Exercise Plan Doing not assesses today .   Self-Monitoring Frequency of testing: 2 times/day Breakfast which is around lunchtime:  223 this am Lunch time: 365 today Supperlast night :156  Hyperglycemia: Yes Daily Hypoglycemia: Yes Rarely today in office   Meal Planning Interested in improving and very little knowledge   Assessment comments: patient seems less anxious about her diagnosis and self care today.  Needs much repetition and encouragement to boost her self confidence. No longer drinks alcohol.    INDIVIDUAL DIABETES EDUCATION PLAN:  Diabetes Disease process Meal planning Medication Monitoring Goal setting Physical activity Healthy Coping  _______________________________________________________________________  Intervention TOPICS COVERED TODAY:  Medication  Taught/evaluated insulin injection, site rotation, insulin storage and needle disposal. Reviewed patients medication for diabetes, action, purpose, timing of dose and side effects. Monitoring  Purpose and frequency of SMBG. Taught/discussed recording of test results and interpretation of SMBG.  PATIENTS GOALS/PLAN (copy and paste in patient instructions so patient receives a copy): 1.  Learning Objective:       demonstrate how to use insulin pen and when to take  injections 2.  Behavioral Objective:         Nutrition: to improve blood glucose levels, I will try to eat about 2- 3 portions fo carbs( foods  that raise my blood sugars) at each meal 3x a day. Never 25%  Medications: To improve blood glucose levels, I will take my medication as prescribed Half of  the time 75%  Monitoring: To identify blood glucose trends, I will test my blood glucose 2x day Most of the  time 75%  Personalized Follow-Up Plan for Ongoing Self Management Support:  Doctor's Office, family and CDE visits ______________________________________________________________________   Outcomes Expected outcomes: Demonstrated interest in learning.Expect positive changes in lifestyle. Self-care Barriers: Unable to determine, Lack of transportation, Lack of material resources, Substance Abuse, Coping skills Education material provided: yes- diabetes care kit that included: Glucose tablets, information about hypoglycemia, how to use insulin pen, injection sites, meal planning  Patient to contact team via Phone if problems or questions. Time in: 1330     Time out: 1430 Future DSMT - 1 week, then 3 weeks with physician   Dontay Harm, Lorraine [name]

## 2011-08-24 ENCOUNTER — Telehealth: Payer: Self-pay | Admitting: Dietician

## 2011-08-24 NOTE — Telephone Encounter (Signed)
Has questions about her cholesterol medicine and cannot afford her antibiotic.

## 2011-08-24 NOTE — Telephone Encounter (Signed)
Called flagyl and diflucan to [facility], pt did not ask about cholesterol

## 2011-08-25 NOTE — Telephone Encounter (Signed)
Pt did keep appointment on the 15th

## 2011-08-29 ENCOUNTER — Ambulatory Visit: Payer: Self-pay | Admitting: Dietician

## 2011-08-30 ENCOUNTER — Ambulatory Visit: Payer: Self-pay | Admitting: Dietician

## 2011-08-30 ENCOUNTER — Telehealth: Payer: Self-pay | Admitting: Dietician

## 2011-08-30 ENCOUNTER — Encounter: Payer: Self-pay | Admitting: Dietician

## 2011-08-30 ENCOUNTER — Ambulatory Visit (INDEPENDENT_AMBULATORY_CARE_PROVIDER_SITE_OTHER): Payer: Self-pay | Admitting: Dietician

## 2011-08-30 DIAGNOSIS — E119 Type 2 diabetes mellitus without complications: Secondary | ICD-10-CM

## 2011-08-30 NOTE — Telephone Encounter (Signed)
Saw patient today:  questions answered.

## 2011-08-30 NOTE — Progress Notes (Signed)
Diabetes Self-Management Training (DSMT)  Follow-Up 1 Visit  08/30/2011 Lorraine [name], identified by name and date of birth, is a 51 y.o. female with Type 2 Diabetes. Year of diabetes diagnosis: 08/2011 Other persons present: no  ASSESSMENT Patient concerns are Healthy Lifestyle.  Height 5\' 3"  (1.6 m), weight 146 lb (66.225 kg). Body mass index is 25.86 kg/(m^2). Lab Results  Component Value Date   LDLCALC Comment:   Not calculated due to Triglyceride >400. Suggest ordering Direct LDL (Unit Code: 16109).   Total Cholesterol/HDL Ratio:CHD Risk                        Coronary Heart Disease Risk Table                                        Men       Women          1/2 Average Risk              3.4        3.3              Average Risk              5.0        4.4           2X Average Risk              9.6        7.1           3X Average Risk             23.4       11.0 Use the calculated Patient Ratio above and the CHD Risk table  to determine the patient's CHD Risk. ATP III Classification (LDL):       < 100        mg/dL         Optimal      604 - 129     mg/dL         Near or Above Optimal      130 - 159     mg/dL         Borderline High      160 - 189     mg/dL         High       > 540        mg/dL         Very High   9/81/1914   Lab Results  Component Value Date   HGBA1C 10.2 08/18/2011   Medication Nutrition Monitor: [facility]  Labs reviewed.  DIABETES BUNDLE: A1C in past 6 months? Yes.  Less than 7%? No LDL in past year? Yes.  Less than 100 mg/dL? Yes Microalbumin ratio in past year? No. Patient taking ACE or ARB? Yes. Blood pressure less than 130/80? Yes. Foot exam in last year? Yes. Eye exam in past year? No.  Reminded patient to schedule eye exam. Tobacco use? Yes.  Smoking cessation offered? Yes Pneumovax? Yes Flu vaccine? Yes Asprin? No  Patients belief/attitude about diabetes: Diabetes can be controlled. Self foot exams daily: Yes Diabetes Complications: None Family  history of diabetes: Yes-brother and mother Support systems: daughters Special needs: None Prior DM Education: Yes   Medications See Medications list.  Is interested in learning more  Exercise Plan Doing walking for 30 minutesa day.   Self-Monitoring Frequency of testing: 3-4 times/day Breakfast: 141,137,127,227,178,45(no symptoms reported-? Validity),223 Lunch: not sure if values are before or after [phone] is reported as before,89146,247,226 Supper: 150, Bedtime: 85,132,147,220,156,70,76,94  Hyperglycemia: No Hypoglycemia: No   Meal Planning Some knowledge and Interested in improving   Assessment comments: need to verify times of blood sugar test as before or after lunch and dinner . Patient seems much more comfortable with caring for her diabetes. Discussed alcohol intake to be discontinued or limited to one light beer per day. Assisted patient in calling quitline as she reports wanting to quit smoking   INDIVIDUAL DIABETES EDUCATION PLAN:  Diabetes disease state Nutrition management Medical Nutrition Therapy Physical activity and exercise Medication Monitoring Acute complication: Chronic complications Goal setting Psychosocial adjustment _______________________________________________________________________  Intervention TOPICS COVERED TODAY:  Diabetes disease state assured that patient understands her type of diabetes and that is will likely not go away Nutrition management  Role of diet in the treatment of diabetes and the relationship between the three main macronutritents and blood glucose control. Reviewed blood glucose goals for pre and post meals and how to evaluate the patients' food intake on their blood glucose level. Effects of alcohol on blood glucose and safety factors with consumption of alcohol. Physical activity and exercise  Role of exercise on diabetes management, blood pressure control and cardiac health. Medication  Reviewed patients  medication for diabetes, action, purpose, timing of dose and side effects. Monitoring  Taught/discussed recording of test results and interpretation of SMBG. Identified appropriate SMBG and A1C goals. Goal setting  Lifestyle issues that need to be addressed for better diabetes care Review risk of smoking and offered smoking cessation Helped patient develop diabetes management plan for quitting smoking  PATIENTS GOALS/PLAN (copy and paste in patient instructions so patient receives a copy): 1.  Learning Objective:       Know how to care for your diabetes 2.  Behavioral Objective:         Nutrition: To improve blood glucose control I will follow meal plan of 3 carbs per meal and 1 for snacks Half of the time 50% Reducing Risk: To decrease the risk for complications, I will stop smoking  Never 0%  Personalized Follow-Up Plan for Ongoing Self Management Support:  Doctor's Office, Support group, family and CDE visits ______________________________________________________________________   Outcomes Expected outcomes: Demonstrated interest in learning.Expect positive changes in lifestyle. Self-care Barriers: Impaired vision Education material provided: carb sheet for reveiw Patient to contact team via Phone if problems or questions. Time in: 1000     Time out: 1125 Future DSMT - 3-6 wks   Plyler, [name]

## 2011-08-30 NOTE — Patient Instructions (Addendum)
INDIVIDUAL DIABETES EDUCATION PLAN:   Meal planning - 3 carb choices at each meal- see sheet, 1 choice or less for snacks  Medication - 10 units of insulin twice a day- please be sure that [facility] [phone]) has your prescription  Monitoring -please go to [facility] to get a wave sense Presto  Meter & 100 strips  Goal setting - blood sugars before meals to be 75 to 130  Physical activity - walk every day for at least 30 minutes       Healthy Coping- you are doing wonderful at taking care of your diabetes!!!  Call 1-800-quit Now and ask to speak to a coach- they can get the patches for you.

## 2011-08-30 NOTE — Progress Notes (Signed)
Patient kept very good records of blood sugars and food intake. She is keeping carbs per record between 3-4 servings at most meals.

## 2011-08-30 NOTE — Telephone Encounter (Signed)
[facility] returned call: they will give her a sample of Novolog Mix 70/30 and give her a meter and strips.

## 2011-09-06 ENCOUNTER — Ambulatory Visit (INDEPENDENT_AMBULATORY_CARE_PROVIDER_SITE_OTHER): Payer: Self-pay | Admitting: Sports Medicine

## 2011-09-06 VITALS — BP 127/86

## 2011-09-06 DIAGNOSIS — IMO0001 Reserved for inherently not codable concepts without codable children: Secondary | ICD-10-CM

## 2011-09-06 DIAGNOSIS — M797 Fibromyalgia: Secondary | ICD-10-CM

## 2011-09-06 DIAGNOSIS — M545 Low back pain: Secondary | ICD-10-CM

## 2011-09-06 MED ORDER — GABAPENTIN 300 MG PO CAPS: 300.0000 mg | ORAL_CAPSULE | Freq: Three times a day (TID) | ORAL | Status: DC

## 2011-09-06 MED ORDER — NAPROXEN 500 MG PO TABS: 500.0000 mg | ORAL_TABLET | Freq: Two times a day (BID) | ORAL | Status: DC

## 2011-09-06 NOTE — Assessment & Plan Note (Signed)
Patient is advised on about a 10 day course of Naprosyn again to see if we can control pain. I would like to try to increase her gabapentin to 300 3 times a day to see if that gives her better control of her radiculopathy.  I am happy to provide her with records to use on her disability evaluation. Overall she has consistent complaints with lumbar radiculopathy I have not demonstrated any discogenic findings or significant weakness in the left lower extremity on an EMI examinations.  Further evaluation of disability would need to come from her primary care physician.

## 2011-09-06 NOTE — Progress Notes (Signed)
  Subjective:    Patient ID: Lorraine [name], female    DOB: 1961/07/24, 51 y.o.   MRN: [id number]  HPI  Pt presents to clinic for evaluation of 3rd finger on Rt hand that locks, swells and is painful. She also states she is having significant lower back pain.   She still complains of radicular symptoms at run down her left leg. She has not had any weakness or giving out of her leg.  In the past she has responded to a variety of medicines. Gabapentin has been helpful at times. Naprosyn. Using amitriptyline at nighttime has been helpful.  Currently I am not sure of her medication regimen but it appears that she is taking only 100 of gabapentin 3 times a day which would be a very low dose. Because of her history of hepatitis we do not want to keep her on Naprosyn for 2 long period of time. Narcotic medications are not a good choice in this patient.  Note she is coming with a disability form today and is evidently undergoing evaluation for disability.  Review of Systems     Objective:   Physical Exam No acute distress  Full knee flexion and extension Neg straight leg raise, no weakness of great toe on lt No hip flexion weakness, mild quad weakness on lt  Small incision over L3-5  No reaction to direct pressure of lower back Equal HS strength bilat  Seated has equal quad strength  She has full flexion and extension of all fingers today. On palpation of the right third MTP joint on the palmar surface there may be a little bit of nodularity but it is not locking today. I cannot really identify a trigger finger based on this.     Assessment & Plan:

## 2011-09-07 ENCOUNTER — Telehealth: Payer: Self-pay | Admitting: Dietician

## 2011-09-07 NOTE — Telephone Encounter (Signed)
Wants to know where she should get her pen needles: I informed her that when she begins getting insulin from the health department she should get pen needles with it (because it is a novo nordisk product). In the meantime, I will leave some at front desk for her to pick up or she can go to [facility] and purchase for ~ 20& for 100 needles.

## 2011-09-21 ENCOUNTER — Encounter: Payer: Self-pay | Admitting: Dietician

## 2011-09-21 ENCOUNTER — Other Ambulatory Visit: Payer: Self-pay | Admitting: *Deleted

## 2011-09-21 ENCOUNTER — Ambulatory Visit (INDEPENDENT_AMBULATORY_CARE_PROVIDER_SITE_OTHER): Payer: Self-pay | Admitting: Dietician

## 2011-09-21 ENCOUNTER — Encounter: Payer: Self-pay | Admitting: Internal Medicine

## 2011-09-21 ENCOUNTER — Ambulatory Visit (INDEPENDENT_AMBULATORY_CARE_PROVIDER_SITE_OTHER): Payer: Self-pay | Admitting: Internal Medicine

## 2011-09-21 DIAGNOSIS — F17200 Nicotine dependence, unspecified, uncomplicated: Secondary | ICD-10-CM

## 2011-09-21 DIAGNOSIS — E119 Type 2 diabetes mellitus without complications: Secondary | ICD-10-CM

## 2011-09-21 DIAGNOSIS — E781 Pure hyperglyceridemia: Secondary | ICD-10-CM

## 2011-09-21 DIAGNOSIS — I10 Essential (primary) hypertension: Secondary | ICD-10-CM

## 2011-09-21 DIAGNOSIS — M5090 Cervical disc disorder, unspecified, unspecified cervical region: Secondary | ICD-10-CM

## 2011-09-21 DIAGNOSIS — M79641 Pain in right hand: Secondary | ICD-10-CM

## 2011-09-21 DIAGNOSIS — M79609 Pain in unspecified limb: Secondary | ICD-10-CM

## 2011-09-21 LAB — LIPID PANEL
Cholesterol: 150 mg/dL (ref 0–200)
Total CHOL/HDL Ratio: 2.5 Ratio
Triglycerides: 126 mg/dL (ref <150)
VLDL: 25 mg/dL (ref 0–40)

## 2011-09-21 MED ORDER — QUINAPRIL-HYDROCHLOROTHIAZIDE 10-12.5 MG PO TABS: 1.0000 | ORAL_TABLET | Freq: Two times a day (BID) | ORAL | Status: DC

## 2011-09-21 MED ORDER — GABAPENTIN 300 MG PO CAPS: 300.0000 mg | ORAL_CAPSULE | Freq: Three times a day (TID) | ORAL | Status: DC

## 2011-09-21 MED ORDER — INSULIN ASPART PROT & ASPART (70-30 MIX) 100 UNIT/ML ~~LOC~~ SUSP: 8.0000 [IU] | Freq: Two times a day (BID) | SUBCUTANEOUS | Status: DC

## 2011-09-21 MED ORDER — METFORMIN HCL 500 MG PO TABS: ORAL_TABLET | ORAL | Status: DC

## 2011-09-21 NOTE — Assessment & Plan Note (Signed)
Pt may have trigger finger of right 3rd finger.  Being followed by sports med.  Pt reports she is considering injection in right hand.

## 2011-09-21 NOTE — Assessment & Plan Note (Signed)
Lab Results  Component Value Date   NA 131* 08/22/2011   K 3.4* 08/22/2011   CL 96 08/22/2011   CO2 22 08/20/2011   BUN 7 08/22/2011   CREATININE 0.70 08/22/2011   CREATININE 0.75 08/18/2011    BP Readings from Last 3 Encounters:  09/21/11 120/88  09/06/11 127/86  08/23/11 123/84    Assessment: Hypertension control:  controlled  Progress toward goals:  at goal Barriers to meeting goals:  no barriers identified  Plan: Hypertension treatment:  continue current medications

## 2011-09-21 NOTE — Assessment & Plan Note (Signed)
Down to 1-2 cigarettes/day.  She is considering complete cessation.

## 2011-09-21 NOTE — Progress Notes (Signed)
Diabetes Self-Management Training (DSMT)  Follow-Up 3 Visit  09/21/2011 [name], identified by name and date of birth, is a 51 y.o. female with Type 2 Diabetes.   ASSESSMENT Patient concerns are Healthy Lifestyle and Glycemic control.  There were no vitals taken for this visit. There is no height or weight on file to calculate BMI. Lab Results  Component Value Date   LDLCALC Comment:   Not calculated due to Triglyceride >400. Suggest ordering Direct LDL (Unit Code: 09811).   Total Cholesterol/HDL Ratio:CHD Risk                        Coronary Heart Disease Risk Table                                        Men       Women          1/2 Average Risk              3.4        3.3              Average Risk              5.0        4.4           2X Average Risk              9.6        7.1           3X Average Risk             23.4       11.0 Use the calculated Patient Ratio above and the CHD Risk table  to determine the patient's CHD Risk. ATP III Classification (LDL):       < 100        mg/dL         Optimal      914 - 129     mg/dL         Near or Above Optimal      130 - 159     mg/dL         Borderline High      160 - 189     mg/dL         High       > 782        mg/dL         Very High   9/56/2130   Lab Results  Component Value Date   HGBA1C 10.2 08/18/2011    Labs reviewed.  DIABETES BUNDLE: A1C in past 6 months? Yes.  Less than 7%? No LDL in past year? Yes.  Less than 100 mg/dL? Yes Microalbumin ratio in past year? Yes. Blood pressure less than 130/80? Yes. Foot exam in last year? Yes. Eye exam in past year? No.  Reminded patient to schedule eye exam. Tobacco use? Yes.  Smoking cessation offered? Yes Pneumovax? Yes Flu vaccine? Yes Asprin? No   Medications See Medications list.  Has adequate knowledge and Is interested in learning more   Exercise Plan Doing walking for 30 minutesa day.   Self-Monitoring Frequency of testing: 3-4 times/day Breakfast: 100s +/-30 mg/dl  most of day Monitor: wavesense and she records in logbooks Hyperglycemia: No Hypoglycemia: Yes Weekly   Meal Planning Some knowledge  and Interested in improving   Assessment comments: patient self managing her diabetes very well.    INDIVIDUAL DIABETES EDUCATION PLAN:  Acute complication: Chronic complications _______________________________________________________________________  Intervention TOPICS COVERED TODAY:  Acute complication  Taught treatment of hypoglycemia - the 15 rule. Discussed and identified patients' treatment of hyperglycemia. Chronic complications  Relationship between chronic complications and blood glucose control  PATIENTS GOALS/PLAN (copy and paste in patient instructions so patient receives a copy): 1.  Learning Objective:       Always carry treatment for low blood sugar with her         Problem Solving: To improve my blood glucose control, I will know signs and symptoms of high and low blood sugar and what to do when they happen  Half of the time 50% Reducing Risk: To decrease the risk for complications, I will stop smoking, do foot check daily and get eye exam and know what checks need to be dine yearly to care for her diabetes  Half of the time 50% Healthy Coping: For help with diabetes control, I will ask for help with care when needed and discuss diabetes with diabetes educator, family, nurse or doctor  Half of the time 50%  Personalized Follow-Up Plan for Ongoing Self Management Support:  Doctor's Office, friends, family and CDE visits ______________________________________________________________________   Outcomes Expected outcomes: Demonstrated interest in learning.Expect positive changes in lifestyle.  Self-care Barriers: Impaired vision, Lack of transportation, Lack of material resources  Education material provided: diabetes care card  Patient to contact team via Phone if problems or questions.  Time in: 1530     Time out: 1600  Future  DSMT - 2 months   Lorraine [name], Lorraine [name]

## 2011-09-21 NOTE — Assessment & Plan Note (Signed)
Lab Results  Component Value Date   HGBA1C 10.2 08/18/2011   CREATININE 0.70 08/22/2011   CREATININE 0.75 08/18/2011   CHOL 175 08/18/2011   HDL 42 08/18/2011   TRIG 438* 08/18/2011    Last eye exam and foot exam: Referral to opthalmology placed; foot exam done at last visit and again today  Assessment: Diabetes control: Too soon for repeat A1c, but CBGs look well controlled Progress toward goals: unable to assess Barriers to meeting goals: no barriers identified  Plan: Diabetes treatment: decrease 70/30 to 8u BID and increase metformin to 1000qAM and 500qPM for 1 week, then increase to 1000mg  BID Refer to: diabetes educator for self-management training Instruction/counseling given: reminded to get eye exam, reminded to bring blood glucose meter & log to each visit, reminded to bring medications to each visit and discussed diet   Print information about sick day mgmt at next appointment.

## 2011-09-21 NOTE — Patient Instructions (Addendum)
-  Please decrease insulin to 8 units twice a day.  -Please increase metformin to 1000mg  in the morning and continue 500mg  in the evening for 1 week.  Then increase metformin to 1000mg  twice a day.  If you have any side effects, such as diarrhea, please call the clinic.  -Please increase neurontin to 300mg  three times a day.  You have been doing an amazing job with your diet, exercise and checking your blood glucose! Keep it up! You can decrease checking your sugar to 3 times/day.  Please check your sugar before each meal.  Please bring all medications and your glucometer with you to each visit.  Thank you!

## 2011-09-21 NOTE — Assessment & Plan Note (Addendum)
Patient to continue amitryptaline and increase gabapentin to 300 TID.  Will also reassess b/l hand and foot pain once sugars are better controlled long term.  No relief from naprosyn, and I don't think patient has been taking this medication, she did not bring meds with her to this visit.

## 2011-09-21 NOTE — Assessment & Plan Note (Signed)
Recheck lipid panel today (nonfasting), if unable to calculate LDL, will need to add on direct LDL.  Will need to recheck liver function at next visit.
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
## 2011-09-21 NOTE — Progress Notes (Signed)
Addended by: Alanson Puls on: 09/21/2011 06:25 PM   Modules accepted: Orders

## 2011-09-22 LAB — MICROALBUMIN / CREATININE URINE RATIO
Microalb Creat Ratio: 8.1 mg/g (ref 0.0–30.0)
Microalb, Ur: 0.57 mg/dL (ref 0.00–1.89)

## 2011-09-22 NOTE — Telephone Encounter (Signed)
Accuretic refilled - rx request form faxed to [facility].

## 2011-09-22 NOTE — Progress Notes (Signed)
Patient is working with [place] quitline to help her quit smoking. Rates herself a 50% as far as making progress on smoking cessation. Patient rates herself as 100% on medication, monitoring and nutrition goals

## 2011-09-26 ENCOUNTER — Telehealth: Payer: Self-pay | Admitting: Dietician

## 2011-09-26 NOTE — Telephone Encounter (Signed)
Patient wanted find out if she has an appointment with an eye doctor yet. Told her we only get appointments one day a month so it may be March before she hears from [place] about her appointment. She also wanted to give [place] another contact [phone] which we can leave message on.   Will route to person who schedules appointments

## 2011-09-27 ENCOUNTER — Telehealth: Payer: Self-pay | Admitting: Dietician

## 2011-09-27 NOTE — Telephone Encounter (Signed)
Patient was not sure if she was supposed to take both insulin and metformin. Assured her that she is to take both.

## 2011-09-27 NOTE — Telephone Encounter (Signed)
Opened in error

## 2011-09-28 ENCOUNTER — Telehealth: Payer: Self-pay | Admitting: Dietician

## 2011-09-28 NOTE — Telephone Encounter (Signed)
Discussed with Dr. Milbert Coulter who wants patient to decrease insulin to 5 units twice daily. Patient informed, diabetes medicines reveiwed (patient thought metformin and glucophage were two different medicines) until patient abel to verbalize understanding and correct medicines and doses back to CDE.

## 2011-09-28 NOTE — Telephone Encounter (Signed)
Patient called back to ask if she should still take the 8 units twice daily. Blood sugar was 99 this morning.

## 2011-09-28 NOTE — Telephone Encounter (Signed)
Patient called to say the new medicine (? increase in metformin)  is really lowering her blood sugar: yesterday PM she had a blood sugar of 70 with symptoms. She treated the low blood sugar and rechecked and her blood sugar went up to 140 and she felt better.  Encouraged Willeen to call us if that happens again or if blood sugar stays under 100 because her insulin may need to be adjusted. Will route note to doctor, with history of low blood sugars on 10 units twice daily;  May want to consider decreasing insulin to 5 units twice daily

## 2011-09-28 NOTE — Telephone Encounter (Signed)
Left message

## 2011-09-29 NOTE — Telephone Encounter (Signed)
Patient was calling because she missed breakfast yesterday and wanted to know if she should take her insulin before lunch instead. She did and blood sugars have been fine staying ~ 100 mg/dl with no symptoms of hypoglycemia.

## 2011-10-07 ENCOUNTER — Telehealth: Payer: Self-pay | Admitting: *Deleted

## 2011-10-07 NOTE — Telephone Encounter (Signed)
Pt is called by request of Lorraine [name]., pt states she has swelling of her feet that causes difficulty ambulating, she has been trying to keep feet elevated but does not help very much, she states that she has pain in her feet due to the swelling, she cannot tell me how long this has been ongoing, she denies any/all other problems. It is suggested that she may be seen at [facility] care or ED, she refuses, an appt is offered for mon, at first she is agreeable but then says no that she has too many other appts next week. She states she will wait until 3/13 for her appt. She is again made aware that she can be seen in ED or urg care, she states she understands.

## 2011-10-07 NOTE — Telephone Encounter (Signed)
Needs medical records for Medicaid hearing on March 11th -transferred her to medical records for this.  She also wanted to know if she can soak her feet because they are swollen and are aching real bad. She does not know why feet are swollen: says her weight is about the same, not eating much salt. Feet swollen this week, has been propping her feet on pillows- would like triage to determine if patient needs to be seen by doctor for this.

## 2011-10-07 NOTE — Telephone Encounter (Signed)
No CHF on chart. High risk. Agree with your rec for assessment of sxs. Noted that pt refused.

## 2011-10-10 ENCOUNTER — Ambulatory Visit: Payer: Self-pay | Admitting: Internal Medicine

## 2011-10-10 ENCOUNTER — Telehealth: Payer: Self-pay | Admitting: *Deleted

## 2011-10-10 NOTE — Telephone Encounter (Signed)
Had appt for this am but daughter had a court appearance and could not bring pt to appt. Scheduled for tomorrow 3/5 at 1045. See 3/1 note

## 2011-10-11 ENCOUNTER — Ambulatory Visit (INDEPENDENT_AMBULATORY_CARE_PROVIDER_SITE_OTHER): Payer: Self-pay | Admitting: Sports Medicine

## 2011-10-11 ENCOUNTER — Ambulatory Visit (INDEPENDENT_AMBULATORY_CARE_PROVIDER_SITE_OTHER): Payer: Self-pay | Admitting: Internal Medicine

## 2011-10-11 VITALS — BP 140/88

## 2011-10-11 VITALS — BP 145/100 | HR 85 | Temp 97.4°F | Wt 144.2 lb

## 2011-10-11 DIAGNOSIS — E119 Type 2 diabetes mellitus without complications: Secondary | ICD-10-CM

## 2011-10-11 DIAGNOSIS — M7750 Other enthesopathy of unspecified foot: Secondary | ICD-10-CM

## 2011-10-11 DIAGNOSIS — IMO0002 Reserved for concepts with insufficient information to code with codable children: Secondary | ICD-10-CM | POA: Insufficient documentation

## 2011-10-11 DIAGNOSIS — M6530 Trigger finger, unspecified finger: Secondary | ICD-10-CM

## 2011-10-11 DIAGNOSIS — M797 Fibromyalgia: Secondary | ICD-10-CM

## 2011-10-11 DIAGNOSIS — M7741 Metatarsalgia, right foot: Secondary | ICD-10-CM

## 2011-10-11 MED ORDER — AMITRIPTYLINE HCL 100 MG PO TABS: 100.0000 mg | ORAL_TABLET | Freq: Every day | ORAL | Status: DC

## 2011-10-11 NOTE — Assessment & Plan Note (Signed)
Diagnosis of trigger finger based on history and exam. Treatment with injection into pulley.  Patient tolerated the procedure well. Plan followup PRN.  Discussed warning signs for infection

## 2011-10-11 NOTE — Assessment & Plan Note (Addendum)
Patient is doing very well following change to her anti-hyperglycemic regimen.  She has not experienced any hypoglycemia or hyperglycemia.  She is tolerating her increased dose of metformin well.  Will not make any further adjustments to her regimen at this time.   She describes some symptoms concerning for peripheral neuropathy that may be related to her DM; specifically, the sharp and tingling pain in her bilateral feet.  Discussed treatment options for diabetic neuropathy and discovered that patient is not taking her Elavil.  Reviewed the importance of compliance to achieve the greatest benefit from medications - will submit a refill for Elavil and also increase the dose from 50mg  to 100mg  qhs.  Advised pt to take one half of a tablet (50mg ) at night for seven days before increasing to one full tablet.  Advised pt to schedule a follow up appt in 3-4 weeks to assess her response to this medication and to inquire about adverse effects.  Also advised pt to try otc tylenol/NSAIDS for additional pain relief.

## 2011-10-11 NOTE — Assessment & Plan Note (Signed)
Treated with sports and soles and small metatarsal pads. Patient notes improvement of pain with walking. Followup as needed or if pain returns.

## 2011-10-11 NOTE — Patient Instructions (Signed)
Thank you for coming in today. Use the insoles every day. Your hands should start feeling better in a few days. Come back if you continue to have shoulder pain hand pain or foot pain. Keep workup for fever swelling or redness where we did the injections in your hands today.  Take care and make sure that you had good control of your diabetes.

## 2011-10-11 NOTE — Progress Notes (Signed)
Lorraine [name] is a 51 y.o. female who presents to [facility] today for   1) bilateral trigger finger:  For over one month [name] experiences bilateral third finger being stuck in a flexed position especially in the morning. It will sometimes spontaneously resolve but often she has to "pop" in her finger back.  This causes pain.  Otherwise her hands are normal.  2) bilateral foot pain: Present for over one month. Significantly notes pain on the bottom of her foot near the metatarsal heads.  She notes she does a lot of walking to catch the bus. She denies any significant numbness or tingling into her toes or chronic pain at rest.     PMH reviewed. Significant for diabetes and hepatitis ROS as above otherwise neg Medications reviewed. Current Outpatient Prescriptions  Medication Sig Dispense Refill  . amitriptyline (ELAVIL) 50 MG tablet Take 1 tablet (50 mg total) by mouth at bedtime.  30 tablet  3  . amLODipine (NORVASC) 5 MG tablet Take 5 mg by mouth daily.        Marland Kitchen gabapentin (NEURONTIN) 300 MG capsule Take 1 capsule (300 mg total) by mouth 3 (three) times daily.  90 capsule  3  . insulin aspart protamine-insulin aspart (NOVOLOG MIX 70/30 FLEXPEN) (70-30) 100 UNIT/ML injection Inject 8 Units into the skin 2 (two) times daily with a meal.  15 mL  3  . metFORMIN (GLUCOPHAGE) 500 MG tablet Increase morning dose to 1000 mg and continue evening dose at 500mg  x 1 week, then increase to 1000mg  twice daily.  60 tablet  2  . Multiple Vitamins-Minerals (MULTIVITAMINS THER. W/MINERALS) TABS Take 1 tablet by mouth daily.        . naproxen (NAPROSYN) 500 MG tablet Take 1 tablet (500 mg total) by mouth 2 (two) times daily with a meal. Do not use more than 10 days in a row due to liver problems.  60 tablet  2  . potassium chloride (K-DUR) 10 MEQ tablet Take 2 tablets (20 mEq total) by mouth daily.  15 tablet  0  . pravastatin (PRAVACHOL) 40 MG tablet Take 1 tablet (40 mg total) by mouth every evening.  30 tablet  2    . quinapril-hydrochlorothiazide (ACCURETIC) 10-12.5 MG per tablet Take 1 tablet by mouth 2 (two) times daily.  90 tablet  5  . DISCONTD: amLODipine (NORVASC) 5 MG tablet Take 1 tablet (5 mg total) by mouth daily.  90 tablet  4  . DISCONTD: quinapril-hydrochlorothiazide (ACCURETIC) 10-12.5 MG per tablet Take 2 tablets by mouth daily.  60 tablet  5    Exam:  BP 140/88 Gen: Well NAD  Hands:  Right hand normal-appearing, left hand missing the distal phalanges on the fourth digit.  Range of motion of hand is normal. Some crepitations felt that the first pulley bilateral third fingers.  Hands are neurovascularly intact.  Feet: Normal-appearing feet bilaterally. Left foot has a scar at the fifth metatarsophalangeal joint.   Tender to palpation on the third metatarsal heads bilaterally. Left foot positive squeeze test right foot negative squeeze test.  Procedure: Right third digit trigger finger injection Consent obtained and timeout performed.  Area cleaned with Betadine and alcohol.  Then using a TB needle 0.2 mL of 10 mg/mL Solu-Medrol solution and 0.34ml of 1% lidocaine without epinephrine were injected into the first pulley.  No bleeding patient tolerated the procedure well.  Left third digit trigger finger injection Consent obtained and timeout performed.  Area cleaned with Betadine  and alcohol.  Then using a TB needle 0.2 mL of 10 mg/mL Solu-Medrol solution and 0.19ml of 1% lidocaine without epinephrine were injected into the first pulley.  No bleeding patient tolerated the procedure well.

## 2011-10-11 NOTE — Progress Notes (Signed)
Subjective:     Patient ID: Lorraine [name], female   DOB: July 03, 1961, 51 y.o.   MRN: [id number]  HPI  Patient here for f/u of CBGs following decrease to her insulin dose and increase to metformin.  Review of glucometer log reveals CBGs averaging in the low 100s; no hypoglycemic events or significant hyperglycemia.  She is tolerating the increased metformin well and denies any significant nausea, diarrhea, or other adverse effect.    She complains of bilateral foot pain that worsens with ambulation.  She describes the pain as achy as well as sharp "like electric shocks."  She admits to some associated tingling but no numbness.  She has not tried any OTC medications for the pain.  She is currently prescirbed Elavil but has been out of the medication for the past 3 months.   Review of Systems Review of Systems  Constitutional: Negative for fever, chills, diaphoresis, activity change, appetite change, fatigue and unexpected weight change.  HENT: Negative for hearing loss, congestion and neck stiffness.   Eyes: Negative for photophobia, pain and visual disturbance.  Respiratory: Negative for cough, chest tightness, shortness of breath and wheezing.   Cardiovascular: Negative for chest pain and palpitations.  Gastrointestinal: Negative for abdominal pain, blood in stool and anal bleeding.  Genitourinary: Negative for dysuria, hematuria and difficulty urinating.  Musculoskeletal: Negative for joint swelling.  Neurological: Negative for dizziness, syncope, speech difficulty, weakness, numbness and headaches.      Objective:   Physical Exam VItal signs reviewed and stable. GEN: No apparent distress.  Alert and oriented x 3.  Pleasant, conversant, and cooperative to exam. HEENT: head is autraumatic and normocephalic.  Neck is supple without palpable masses or lymphadenopathy.   Vision intact.  EOMI.  PERRLA.  Sclerae anicteric.  Conjunctivae without pallor or injection. Mucous membranes are moist.   Oropharynx is without erythema, exudates, or other abnormal lesions. RESP:  Lungs are clear to ascultation bilaterally with good air movement.  No wheezes, ronchi, or rubs. CARDIOVASCULAR: regular rate, normal rhythm.  Clear S1, S2, no murmurs, gallops, or rubs. ABDOMEN: soft, non-tender, non-distended.  Bowels sounds present in all quadrants and normoactive.  No palpable masses. EXT: warm and dry.  Peripheral pulses equal, intact, and +2 globally.  No clubbing or cyanosis.  No edema in bilateral lower extremities.  No abnormal wounds, lesions, or ulcerations noted on bilateral feet/lower extremities. SKIN: warm and dry with normal turgor.  No rashes or abnormal lesions observed. NEURO: CN II-XII grossly intact.  Muscle strength +5/5 in bilateral upper and lower extremities.  Sensation is intact to light touch and pinprick globally.  No focal deficit.     Assessment:

## 2011-10-11 NOTE — Patient Instructions (Signed)
Schedule a followup appointment in 3-4 weeks with Dr. Arvilla Market or Dr. Milbert Coulter. Start taking one half of an Elavil (amitriptyline) tablet at night for 7 days.  After 7 nights, increase to a full tablet. Try taking over the counter Tylenol or Ibuprofen for additional relief of your foot pain. Keep up the good work with your blood sugar!

## 2011-10-17 ENCOUNTER — Other Ambulatory Visit: Payer: Self-pay | Admitting: Internal Medicine

## 2011-10-17 ENCOUNTER — Telehealth: Payer: Self-pay | Admitting: Dietician

## 2011-10-17 NOTE — Telephone Encounter (Signed)
Per message left on voicemail Friday- she has been calling the Health department and has not heard back from health department.   Returned call: per daughter patient did get insulin and needles from the health department. Informed daughter to call main number and speak to traige nurse in an emergency situation that they need immediate attention. Daughter verbalized understanding.

## 2011-11-01 ENCOUNTER — Ambulatory Visit (INDEPENDENT_AMBULATORY_CARE_PROVIDER_SITE_OTHER): Payer: Self-pay | Admitting: Internal Medicine

## 2011-11-01 ENCOUNTER — Encounter: Payer: Self-pay | Admitting: Internal Medicine

## 2011-11-01 VITALS — BP 135/90 | HR 81 | Temp 97.1°F | Ht 63.0 in | Wt 141.9 lb

## 2011-11-01 DIAGNOSIS — E876 Hypokalemia: Secondary | ICD-10-CM

## 2011-11-01 DIAGNOSIS — M79609 Pain in unspecified limb: Secondary | ICD-10-CM

## 2011-11-01 DIAGNOSIS — E119 Type 2 diabetes mellitus without complications: Secondary | ICD-10-CM

## 2011-11-01 DIAGNOSIS — M79673 Pain in unspecified foot: Secondary | ICD-10-CM | POA: Insufficient documentation

## 2011-11-01 LAB — COMPREHENSIVE METABOLIC PANEL
ALT: 29 U/L (ref 0–35)
AST: 165 U/L — ABNORMAL HIGH (ref 0–37)
Albumin: 3.9 g/dL (ref 3.5–5.2)
CO2: 28 mEq/L (ref 19–32)
Calcium: 9.5 mg/dL (ref 8.4–10.5)
Chloride: 100 mEq/L (ref 96–112)
Potassium: 3.7 mEq/L (ref 3.5–5.3)

## 2011-11-01 LAB — GLUCOSE, CAPILLARY: Glucose-Capillary: 136 mg/dL — ABNORMAL HIGH (ref 70–99)

## 2011-11-01 LAB — MAGNESIUM: Magnesium: 1.7 mg/dL (ref 1.5–2.5)

## 2011-11-01 MED ORDER — MELOXICAM 7.5 MG PO TABS: 7.5000 mg | ORAL_TABLET | Freq: Every day | ORAL | Status: DC

## 2011-11-01 NOTE — Assessment & Plan Note (Signed)
Patient has been mildly h hypokalemic since January 2013.  This may be the result of HCTZ; she denies any diarrhea or vomiting.  She was prescribed potassium supplementation at 40 mg once daily; she has not been taking this for over 2 weeks.  Will check a contracted metabolic panel today to assess her potassium status; will also check a magnesium level (this was last checked in 2008 and was within normal limits at that time).

## 2011-11-01 NOTE — Patient Instructions (Addendum)
Schedule followup appointments with Dr. Milbert Coulter in one month  Mobic is a new medicine to help with your pain. Take 1 by mouth in the morning with food. Do not take any ibuprofen, Aleve, Naprosyn, Advil, or other NSAID medications while you take Mobic.  You can take over the counter Tylenol for additional pain relief if you need to. Start taking one full tablet of your amitriptyline at night.  This will help the pain in your feet. Keep taking all of your other medicine as directed. I will call you if your blood work today is abnormal.

## 2011-11-01 NOTE — Progress Notes (Signed)
Patient ID: Lorraine [name], female   DOB: [birth year], 51 y.o.   MRN: [id number]  Subjective:      HPI  Patient here for f/u of her peripheral neuropathy. At her last office visit she was complaining of burning, "electric" pain in her bilateral feet. At her last office visit, amitriptyline was resumed at 50 mg for one week with plans to increase to 100 mg.  Patient was also advised to use over-the-counter ibuprofen for additional pain relief.  She notes some improvement of her symptoms with the amitriptyline; she [place] currently only taking one half of a tablet (50mg ).  She has taken otc ibuprofen with no significant relief of symptoms.  She denies any new tingling, weakness, difficulty walking, or other symptom.  She has her medications with her today.  Her KCl bottle was empty and did not have any refills; patient states she has not been taking potassium for a while.  She denies any increased fatigue, heart palpitations, chest pain, muscle cramping/spasm, or other complaint.   Review of Systems  Constitutional: Negative for fever, chills, diaphoresis, activity change, appetite change, fatigue and unexpected weight change.  HENT: Negative for hearing loss, congestion and neck stiffness.   Eyes: Negative for photophobia, pain and visual disturbance.  Respiratory: Negative for cough, chest tightness, shortness of breath and wheezing.   Cardiovascular: Negative for chest pain and palpitations.  Gastrointestinal: Negative for abdominal pain, blood in stool and anal bleeding.  Genitourinary: Negative for dysuria, hematuria and difficulty urinating.  Musculoskeletal: Negative for joint swelling.  Neurological: Negative for dizziness, syncope, speech difficulty, weakness, numbness and headaches.      Objective:   Physical Exam Vitals reviewed. GEN: No apparent distress.  Alert and oriented x 3.  Pleasant, conversant, and cooperative to exam. HEENT: head is autraumatic and normocephalic.  Neck is supple  without palpable masses or lymphadenopathy. Vision intact.  EOMI.  PERRLA.  Sclerae anicteric.  Conjunctivae without pallor or injection. Mucous membranes are moist.  Oropharynx is without erythema, exudates, or other abnormal lesions. RESP:  Lungs are clear to ascultation bilaterally with good air movement.  No wheezes, ronchi, or rubs. CARDIOVASCULAR: regular rate, normal rhythm.  Clear S1, S2, no murmurs, gallops, or rubs. EXT: warm and dry.  Peripheral pulses equal, intact, and +2 globally.  No clubbing or cyanosis.  No edema in bilateral lower extremities.  No abnormal wounds, lesions, or ulcerations noted on bilateral feet/lower extremities. SKIN: warm and dry with normal turgor.  No rashes or abnormal lesions observed. NEURO: CN II-XII grossly intact.  Muscle strength +5/5 in bilateral upper and lower extremities.  Sensation is intact to light touch and pinprick globally.  No focal deficit.     Assessment:

## 2011-11-01 NOTE — Assessment & Plan Note (Signed)
This appears to be multifactorial and related to diabetic peripheral neuropathy and her metatarsalgia.  She has had some improvement of her neuropathic pain with amitriptyline.  Advised patient to increase to full dose of 100 mg at night.  We'll also prescribe her Mobic for additional pain relief.  She is advised to take the medication with food and avoid any other NSAIDs while using mobic.  Will have her follow up with her PCP in one month.

## 2011-11-02 MED ORDER — INSULIN ASPART PROT & ASPART (70-30 MIX) 100 UNIT/ML ~~LOC~~ SUSP: 5.0000 [IU] | Freq: Two times a day (BID) | SUBCUTANEOUS | Status: DC

## 2011-11-07 ENCOUNTER — Other Ambulatory Visit: Payer: Self-pay | Admitting: *Deleted

## 2011-11-07 MED ORDER — POTASSIUM CHLORIDE ER 10 MEQ PO TBCR: 20.0000 meq | EXTENDED_RELEASE_TABLET | Freq: Every day | ORAL | Status: DC

## 2011-11-07 NOTE — Telephone Encounter (Signed)
Note from [facility] pharmacy - refill Klor-con M20 tab - take one tablet by mouth every day.

## 2011-11-15 ENCOUNTER — Ambulatory Visit (INDEPENDENT_AMBULATORY_CARE_PROVIDER_SITE_OTHER): Payer: Self-pay | Admitting: Sports Medicine

## 2011-11-15 DIAGNOSIS — E119 Type 2 diabetes mellitus without complications: Secondary | ICD-10-CM

## 2011-11-15 DIAGNOSIS — M7741 Metatarsalgia, right foot: Secondary | ICD-10-CM

## 2011-11-15 DIAGNOSIS — IMO0002 Reserved for concepts with insufficient information to code with codable children: Secondary | ICD-10-CM

## 2011-11-15 DIAGNOSIS — M6530 Trigger finger, unspecified finger: Secondary | ICD-10-CM

## 2011-11-15 DIAGNOSIS — M7750 Other enthesopathy of unspecified foot: Secondary | ICD-10-CM

## 2011-11-15 MED ORDER — GABAPENTIN 300 MG PO CAPS: ORAL_CAPSULE | ORAL | Status: DC

## 2011-11-15 NOTE — Assessment & Plan Note (Signed)
Improved post injection 

## 2011-11-15 NOTE — Progress Notes (Signed)
  Subjective:    Patient ID: Lorraine [name], female    DOB: 1960/12/15, 51 y.o.   MRN: [id number]  HPI 51 year old female with DM and Hep C presents for follow-up of right shoulder pain, peripheral neuropathy, and metatarsal pain.    The metatarsal pain is significantly better with the metatarsal pads, however she does not have the pads in all of her shoes.    Her shoulder pain is unchanged from prior.  She has been trying Acetaminophen 650 mg PO q 4 hours which has not helped for the pain.  The shoulder pain is described at sharp and shooting from the neck to the shoulder and the arm.    Her peripheral neuropathy continues to cause her significant discomfort with daily burning pain in her hands and feet.  She currently takes Amitriptyline 100 mg PO qHS and Gabapentin 300 mg PO TID.   Review of Systems     Objective:   Physical Exam Gen: pleasant, in NAD MSK: tightness of the right trapezius, ttp present bilaterally over the trapezius, decreased ROM of neck with limited rotation to the right, sensation intact to light touch throughout bilateral upper extremities, pain reproduced with neck rotation to left and right.        Assessment & Plan:  51 year old female with DM and Hep C.  Metatarsal pain is improved.  Peripheral neuropathy pain persists in spite of Amitriptyline and Gabapentin.  New right shoulder pain likely due to muscle spasm.  Plan: - Continue to use metatarsal pads, use them in all of your shoes. We added into shoes today   - Increase Gabapentin to 600 mg at night.  Continue 300 mg in the morning and afternoon. - Follow-up in 6 weeks.  seeif this helps with neck spasm as well as the tingling in hands and feet

## 2011-11-15 NOTE — Assessment & Plan Note (Signed)
Cont new MT pads in all shoes

## 2011-11-15 NOTE — Patient Instructions (Signed)
Increase your Neurontin (Gabapentin) to 2 pills at nighttime.  Continue one pill in the morning and one pill at lunchtime. Wear your metatarsal pads at all times.

## 2011-11-15 NOTE — Assessment & Plan Note (Signed)
I think some of her sxs seem related to neuropathic type pain  graduallly increase gabapentin

## 2011-11-16 ENCOUNTER — Other Ambulatory Visit: Payer: Self-pay | Admitting: *Deleted

## 2011-11-16 MED ORDER — POTASSIUM CHLORIDE ER 10 MEQ PO TBCR: 20.0000 meq | EXTENDED_RELEASE_TABLET | Freq: Every day | ORAL | Status: DC

## 2011-11-16 NOTE — Telephone Encounter (Signed)
Last refill 4/1 was qty #15 for "Take 2 tabs daily".  Thanks

## 2011-11-18 ENCOUNTER — Other Ambulatory Visit: Payer: Self-pay | Admitting: *Deleted

## 2011-11-18 NOTE — Telephone Encounter (Signed)
[facility]-Dur rx refill request w/3 refills faxed to [facility].

## 2011-11-29 ENCOUNTER — Other Ambulatory Visit: Payer: Self-pay | Admitting: *Deleted

## 2011-11-29 DIAGNOSIS — E781 Pure hyperglyceridemia: Secondary | ICD-10-CM

## 2011-11-29 MED ORDER — PRAVASTATIN SODIUM 40 MG PO TABS: 40.0000 mg | ORAL_TABLET | Freq: Every evening | ORAL | Status: DC

## 2011-11-29 NOTE — Telephone Encounter (Signed)
Rx called in 

## 2011-11-30 NOTE — Telephone Encounter (Signed)
Pharmacy had Rx. 

## 2011-12-07 ENCOUNTER — Ambulatory Visit (INDEPENDENT_AMBULATORY_CARE_PROVIDER_SITE_OTHER): Payer: Self-pay | Admitting: Internal Medicine

## 2011-12-07 ENCOUNTER — Encounter: Payer: Self-pay | Admitting: Internal Medicine

## 2011-12-07 DIAGNOSIS — I10 Essential (primary) hypertension: Secondary | ICD-10-CM

## 2011-12-07 DIAGNOSIS — M79609 Pain in unspecified limb: Secondary | ICD-10-CM

## 2011-12-07 DIAGNOSIS — G629 Polyneuropathy, unspecified: Secondary | ICD-10-CM

## 2011-12-07 DIAGNOSIS — H0014 Chalazion left upper eyelid: Secondary | ICD-10-CM | POA: Insufficient documentation

## 2011-12-07 DIAGNOSIS — Z79899 Other long term (current) drug therapy: Secondary | ICD-10-CM

## 2011-12-07 DIAGNOSIS — N898 Other specified noninflammatory disorders of vagina: Secondary | ICD-10-CM

## 2011-12-07 DIAGNOSIS — E119 Type 2 diabetes mellitus without complications: Secondary | ICD-10-CM

## 2011-12-07 DIAGNOSIS — H0019 Chalazion unspecified eye, unspecified eyelid: Secondary | ICD-10-CM

## 2011-12-07 DIAGNOSIS — M5090 Cervical disc disorder, unspecified, unspecified cervical region: Secondary | ICD-10-CM

## 2011-12-07 DIAGNOSIS — Z598 Other problems related to housing and economic circumstances: Secondary | ICD-10-CM | POA: Insufficient documentation

## 2011-12-07 DIAGNOSIS — IMO0001 Reserved for inherently not codable concepts without codable children: Secondary | ICD-10-CM

## 2011-12-07 DIAGNOSIS — M79673 Pain in unspecified foot: Secondary | ICD-10-CM

## 2011-12-07 LAB — BASIC METABOLIC PANEL
BUN: 13 mg/dL (ref 6–23)
CO2: 28 mEq/L (ref 19–32)
Chloride: 101 mEq/L (ref 96–112)
Creat: 0.72 mg/dL (ref 0.50–1.10)
Glucose, Bld: 85 mg/dL (ref 70–99)
Potassium: 3.9 mEq/L (ref 3.5–5.3)

## 2011-12-07 LAB — GLUCOSE, CAPILLARY: Glucose-Capillary: 107 mg/dL — ABNORMAL HIGH (ref 70–99)

## 2011-12-07 MED ORDER — METFORMIN HCL 500 MG PO TABS: ORAL_TABLET | ORAL | Status: DC

## 2011-12-07 MED ORDER — AMLODIPINE BESYLATE 5 MG PO TABS: 5.0000 mg | ORAL_TABLET | Freq: Every day | ORAL | Status: DC

## 2011-12-07 NOTE — Progress Notes (Signed)
Subjective:   Patient ID: Lorraine [name] female   DOB: 28-Sep-1960 51 y.o.   MRN: [id number]  HPI: LorraineJasneet C [name] is a 51 y.o. woman who presents for DM follow up.  Doing excellent with DM mgmt. Review of glucometer log reveals CBGs in low 100s (checks before breakfast & before dinner).  Significant lifestyle modifications.  Increased walking to 30-52min daily.  Breakfast is waffles/pancakes/apple/PB sandwhich.  Lunch is burger/salad.  Dinner is spaghetti/salad.  No s/s of hyperglycemia or hypoglycemia.   C/o persistent b/l burning/achy feet, right sided neck pain and b/l trigger fiinger pain.  Last appt with sports med was  11/15/11.   C/o white, pruritic discharge x 2 weeks.  Was sexually active 2 weeks ago.   Continues to smoke 1-2 cig/day.  Current Outpatient Prescriptions  Medication Sig Dispense Refill  . amitriptyline (ELAVIL) 100 MG tablet Take 1 tablet (100 mg total) by mouth at bedtime.  30 tablet  3  . amLODipine (NORVASC) 5 MG tablet Take 1 tablet (5 mg total) by mouth daily.  90 tablet  3  . gabapentin (NEURONTIN) 300 MG capsule Take 1 tablet by mouth in the morning, 1 tablet mid day, and 2 tablets at bedtime.  120 capsule  3  . meloxicam (MOBIC) 7.5 MG tablet Take 1 tablet (7.5 mg total) by mouth daily.  30 tablet  1  . metFORMIN (GLUCOPHAGE) 500 MG tablet Take 500mg  (1 tab) in the morning and 1000mg  (2 tab) in the evening for 2 weeks.  Then increase to 1000mg  (2 tab) twice daily.  Please call your doctor if you are unable to tolerate this regimen.  90 tablet  1  . Multiple Vitamins-Minerals (MULTIVITAMINS THER. W/MINERALS) TABS Take 1 tablet by mouth daily.        . potassium chloride (K-DUR) 10 MEQ tablet Take 2 tablets (20 mEq total) by mouth daily.  60 tablet  43  . pravastatin (PRAVACHOL) 40 MG tablet Take 1 tablet (40 mg total) by mouth every evening.  30 tablet  2  . quinapril-hydrochlorothiazide (ACCURETIC) 10-12.5 MG per tablet Take 1 tablet by mouth 2 (two) times daily.   90 tablet  5  . DISCONTD: amLODipine (NORVASC) 5 MG tablet Take 5 mg by mouth daily.        Marland Kitchen DISCONTD: metFORMIN (GLUCOPHAGE) 500 MG tablet TAKE 1 TABLET BY MOUTH TWICE DAILY WITH MEALS  60 tablet  1  . DISCONTD: amLODipine (NORVASC) 5 MG tablet Take 1 tablet (5 mg total) by mouth daily.  90 tablet  4  . DISCONTD: quinapril-hydrochlorothiazide (ACCURETIC) 10-12.5 MG per tablet Take 2 tablets by mouth daily.  60 tablet  5   Review of Systems: Constitutional: Denies fever, chills, diaphoresis, appetite change and fatigue.  HEENT: Denies photophobia, eye pain, redness, hearing loss, ear pain, congestion, sore throat, rhinorrhea, sneezing, mouth sores, trouble swallowing, neck pain, neck stiffness and tinnitus.   Respiratory: Denies SOB, DOE, cough, chest tightness,  and wheezing.   Cardiovascular: Denies chest pain, palpitations and leg swelling.  Gastrointestinal: Denies nausea, vomiting, abdominal pain, diarrhea, constipation, blood in stool and abdominal distention.  Genitourinary: Denies urgency, frequency, hematuria, flank pain and difficulty urinating.   Neurological: Denies dizziness, seizures, syncope, weakness, light-headedness and headaches.  Psychiatric/Behavioral: Denies suicidal ideation, mood changes, confusion, nervousness, sleep disturbance and agitation  Objective:  Physical Exam: Filed Vitals:   12/07/11 1130  BP: 129/88  Pulse: 88  Temp: 97.2 F (36.2 C)   Constitutional: Vital signs reviewed.  Patient is a well-developed and well-nourished woman in no acute distress and cooperative with exam.  Mouth: no erythema or exudates, MMM Eyes: PERRL, EOMI, conjunctivae normal, No scleral icterus. Left upper eyelid chalazion Neck: No thyromegaly Cardiovascular: RRR, S1 normal, S2 normal, no MRG, pulses symmetric and intact bilaterally Pulmonary/Chest: CTAB, no wheezes, rales, or rhonchi Abdominal: Soft. Non-tender, non-distended, bowel sounds are normal, no masses,  organomegaly, or guarding present.  Musculoskeletal: No joint deformities, erythema; ROM of hand joints & neck limited by pain Neurological: A&O x3, Strength is normal and symmetric bilaterally, cranial nerve II-XII are grossly intact, no focal motor deficit, sensory intact to light touch bilaterally.  Skin: Warm, dry and intact. No rash, cyanosis, or clubbing.  Psychiatric: Normal mood and affect. speech and behavior is normal. Judgment and thought content normal. Cognition and memory are normal.   Assessment & Plan:   Case and Care discussed with Dr. Rogelia Boga. Patient to return in 1 month to review CBGs off insulin.  Please see problem oriented charting for further details.

## 2011-12-07 NOTE — Assessment & Plan Note (Signed)
Continue amitriptyline. Tolerating well.

## 2011-12-07 NOTE — Assessment & Plan Note (Signed)
Lab Results  Component Value Date   NA 137 11/01/2011   K 3.7 11/01/2011   CL 100 11/01/2011   CO2 28 11/01/2011   BUN 8 11/01/2011   CREATININE 0.64 11/01/2011   CREATININE 0.70 08/22/2011    BP Readings from Last 3 Encounters:  12/07/11 129/88  11/15/11 130/80  11/01/11 135/90    Assessment: Hypertension control:  controlled  Progress toward goals:  at goal Barriers to meeting goals:  no barriers identified  Plan: Hypertension treatment:  continue current medications, check BMET today

## 2011-12-07 NOTE — Patient Instructions (Signed)
-  Please increase metformin to 1 tablet (500mg ) in the morning, and 2 tablets (1000mg ) in the evening for 2 weeks. Then increase to 2 tablets (1000mg ) twice a day.  If you cannot tolerate this, please call the clinic at [phone].  -Please take neurontin as follows: 300mg  (1 tab) in the morning, 300mg  (1 tab) at midday, and 600mg  (2 tab) in the evening to help with burning pain of feet.  -Please stop taking insulin.  -Please return in 1 month so we can review your sugar log since you will stop taking insulin.  Continue to take blood sugar 1-2 times per day as you have been doing.  -I will call you with results from your vaginal exam.   Please be sure to bring all of your medications with you to every visit.  Should you have any new or worsening symptoms, please be sure to call the clinic at [phone].

## 2011-12-07 NOTE — Assessment & Plan Note (Signed)
Vaginal exam performed.  White discharge may be normal.  Wet prep & GC/Chlamydia to follow.  Will treat according to results.

## 2011-12-07 NOTE — Assessment & Plan Note (Signed)
Asked to write letter to patient's attorney ([employer], [address], #[phone]) for investigation of eligibility for disability.  Will be done within the next week.

## 2011-12-07 NOTE — Assessment & Plan Note (Signed)
Continue Amitriptaline & gabapentin ( 300, 300, 600 daily); Also on trial of mobic x30d.

## 2011-12-07 NOTE — Assessment & Plan Note (Addendum)
Multifactorial.  ?Diabetic peripheral neuropathy given new onset DM, but pain seems to be classic of this.    Continue Amitriptyline at full dose (100 qHS) Increase Neurontin (300, 300, 600) Continue trial of Mobic from 3/26 appt Check TSH, B12 (though Hb wnl)  ADDENDUM: 12/08/11, TSH elevated at 5.5, will add on free T4

## 2011-12-07 NOTE — Assessment & Plan Note (Signed)
Lab Results  Component Value Date   HGBA1C 5.5 12/07/2011   CREATININE 0.64 11/01/2011   CREATININE 0.70 08/22/2011   MICROALBUR 0.57 09/21/2011   MICRALBCREAT 8.1 09/21/2011   CHOL 150 09/21/2011   HDL 59 09/21/2011   TRIG 126 09/21/2011    Assessment: Diabetes control: controlled Progress toward goals: improved Barriers to meeting goals: no barriers identified  Plan: Diabetes treatment: Stop Insulin.  Slowly increase metformin.  Continue CBG checks 1-2/day, return in 1 month to review CBGs off insulin.  She had not increased her neurontin dose after her visit with Dr. Darrick Penna; asked that she increase to 300 in am & midday, 600 in evening. Refer to: none Instruction/counseling given: reminded to bring blood glucose meter & log to each visit, reminded to bring medications to each visit and discussed diet

## 2011-12-07 NOTE — Assessment & Plan Note (Signed)
Has been seen by optho.  Instructed to apply warm compress.  She tells me it has intermittently improved with warm compresses

## 2011-12-08 ENCOUNTER — Other Ambulatory Visit: Payer: Self-pay | Admitting: Internal Medicine

## 2011-12-08 NOTE — Progress Notes (Signed)
Addended by: Alanson Puls on: 12/08/2011 11:09 AM   Modules accepted: Orders

## 2011-12-14 ENCOUNTER — Encounter: Payer: Self-pay | Admitting: Internal Medicine

## 2011-12-20 ENCOUNTER — Encounter: Payer: Self-pay | Admitting: Internal Medicine

## 2011-12-20 ENCOUNTER — Ambulatory Visit (INDEPENDENT_AMBULATORY_CARE_PROVIDER_SITE_OTHER): Payer: Self-pay | Admitting: Internal Medicine

## 2011-12-20 VITALS — BP 141/93 | HR 76 | Temp 98.2°F | Ht 63.0 in | Wt 141.5 lb

## 2011-12-20 DIAGNOSIS — I10 Essential (primary) hypertension: Secondary | ICD-10-CM

## 2011-12-20 DIAGNOSIS — E1149 Type 2 diabetes mellitus with other diabetic neurological complication: Secondary | ICD-10-CM

## 2011-12-20 DIAGNOSIS — E1142 Type 2 diabetes mellitus with diabetic polyneuropathy: Secondary | ICD-10-CM

## 2011-12-20 DIAGNOSIS — E1140 Type 2 diabetes mellitus with diabetic neuropathy, unspecified: Secondary | ICD-10-CM

## 2011-12-20 NOTE — Patient Instructions (Signed)
-  Please increase metformin to 2 tablets (1000mg ) in the morning, and 2 tablets (1000mg ) in the evening. If you cannot tolerate this, please call the clinic at [phone].  We are going to keep you off the insulin at this time.  -Please take neurontin as follows: 300mg  (1 tab) in the morning, 300mg  (1 tab) at midday, and 600mg  (2 tab) in the evening to help with burning pain of feet.   -Please return in August for your 3 month diabetes follow up.  You can take your blood sugar once every 2-3 days, or if you don't feel well.  Please be sure to bring all of your medications with you to every visit.  Should you have any new or worsening symptoms, please be sure to call the clinic at [phone].

## 2011-12-20 NOTE — Progress Notes (Signed)
  Subjective:   Patient ID: Lorraine [name] female   DOB: 15-Aug-1960 51 y.o.   MRN: [id number]  HPI: [name] is a 51 y.o. with DM, HTN, DJD who presents for DM follow up.  I saw her on 12/07/11 for her 3 month DM check and her HbA1c was 5.5.  We did a trial off insulin, and she is here today for me to monitor her CBGs.  CBGs from May 1-14 are 89-133.  She continues to follow a modified diet and tries to exercise.  She has been taking metformin 500mg  qAM and 1000mg  qPM without adverse effects.  Regarding her foot pain, she has been taking neurontin, 1tab TID and notes that burning sensation has improved.  She has not increased evening dose to 2 tabs as we had discussed.   At last visit she c/o white vaginal discharge and vaginal exam was done.  Wet prep & GC/C were negative.  Patient's symptoms have resolved.     Review of Systems: Constitutional: Denies fever, chills, diaphoresis, appetite change and fatigue.  HEENT: Denies photophobia, eye pain, redness, hearing loss, ear pain, congestion, sore throat, rhinorrhea, sneezing, mouth sores, trouble swallowing, neck pain, neck stiffness and tinnitus.   Respiratory: Denies SOB, DOE, cough, chest tightness,  and wheezing.   Cardiovascular: Denies chest pain, palpitations and leg swelling.  Gastrointestinal: Denies nausea, vomiting, abdominal pain, diarrhea, constipation, blood in stool and abdominal distention.  Genitourinary: Denies dysuria, urgency, frequency, hematuria, flank pain and difficulty urinating.  Musculoskeletal: Chronic right shoulder and neck pain  Skin: Denies pallor, rash and wound.  Neurological: Denies dizziness, seizures, syncope, weakness, light-headedness, numbness and headaches.  Psychiatric/Behavioral: Denies suicidal ideation, mood changes, confusion, nervousness, sleep disturbance and agitation  Objective:  Physical Exam: Filed Vitals:   12/20/11 1058  BP: 141/93  Pulse: 76  Temp: 98.2 F (36.8 C)  TempSrc:  Oral  Height: 5\' 3"  (1.6 m)  Weight: 141 lb 8 oz (64.184 kg)  SpO2: 100%   Constitutional: Vital signs reviewed.  Patient is a well-developed and well-nourished woman in no acute distress and cooperative with exam.  Eyes: PERRL, EOMI, conjunctivae normal, No scleral icterus.   Cardiovascular: RRR, S1 normal, S2 normal, no MRG, pulses symmetric and intact bilaterally Pulmonary/Chest: CTAB, no wheezes, rales, or rhonchi Abdominal: Soft. Non-tender, non-distended, bowel sounds are normal Musculoskeletal: No joint deformities, erythema, or stiffness, ROM full and no nontender.  Neurological: A&O x3 Skin: Warm, dry and intact. No rash, cyanosis, or clubbing.  Psychiatric: Normal mood and affect. speech and behavior is normal. Judgment and thought content normal. Cognition and memory are normal.   Assessment & Plan:   Case and care discussed with Dr. Josem Kaufmann.  Patient to return in 70m for DM follow up. Please see prob oriented charting for further details.

## 2011-12-20 NOTE — Assessment & Plan Note (Addendum)
CBGs remain well controlled on metformin.    Increase metformin to 1000mg  BID, patient to call if she cannot tolerate this Increase neurontin to 1 tab, 1 tab, 2 tab (300, 300, 600) - this was the plan last time, but patient did not increase evening dose to 2 tab.  I suspect we are in the honey moon period, and she will require insulin again in the future.  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  Autoimmune etiology to be kept in mind regarding neuropathy as well, since DM has not been longstanding, it is curious that she has already developed nerve damage.

## 2011-12-20 NOTE — Assessment & Plan Note (Addendum)
Lab Results  Component Value Date   NA 139 12/07/2011   K 3.9 12/07/2011   CL 101 12/07/2011   CO2 28 12/07/2011   BUN 13 12/07/2011   CREATININE 0.72 12/07/2011   CREATININE 0.70 08/22/2011    BP Readings from Last 3 Encounters:  12/20/11 141/93  12/07/11 129/88  11/15/11 130/80   Repeat BP: 130/80  Assessment: Hypertension control:  controlled  Progress toward goals:  at goal Barriers to meeting goals:  no barriers identified  Plan: Hypertension treatment:  continue current medications (amlodipine 5, accuretic 10-12.5 BID)

## 2011-12-26 ENCOUNTER — Encounter: Payer: Self-pay | Admitting: Ophthalmology

## 2011-12-26 ENCOUNTER — Telehealth: Payer: Self-pay | Admitting: *Deleted

## 2011-12-26 ENCOUNTER — Ambulatory Visit (INDEPENDENT_AMBULATORY_CARE_PROVIDER_SITE_OTHER): Payer: Self-pay | Admitting: Ophthalmology

## 2011-12-26 VITALS — BP 171/98 | HR 68 | Temp 97.3°F | Wt 141.7 lb

## 2011-12-26 DIAGNOSIS — R300 Dysuria: Secondary | ICD-10-CM

## 2011-12-26 DIAGNOSIS — N898 Other specified noninflammatory disorders of vagina: Secondary | ICD-10-CM

## 2011-12-26 DIAGNOSIS — M797 Fibromyalgia: Secondary | ICD-10-CM

## 2011-12-26 DIAGNOSIS — L293 Anogenital pruritus, unspecified: Secondary | ICD-10-CM

## 2011-12-26 LAB — GLUCOSE, CAPILLARY

## 2011-12-26 MED ORDER — AMITRIPTYLINE HCL 100 MG PO TABS: 100.0000 mg | ORAL_TABLET | Freq: Every day | ORAL | Status: DC

## 2011-12-26 MED ORDER — FLUCONAZOLE 150 MG PO TABS: 150.0000 mg | ORAL_TABLET | Freq: Once | ORAL | Status: DC

## 2011-12-26 MED ORDER — FLUCONAZOLE 150 MG PO TABS: 150.0000 mg | ORAL_TABLET | Freq: Once | ORAL | Status: AC
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
## 2011-12-26 NOTE — Telephone Encounter (Signed)
Past few days - has white vag discharge , sl odor and vag itching. Appt made. Stanton Kidney Secily Walthour RN 12/26/11 8:45AM

## 2011-12-26 NOTE — Patient Instructions (Signed)
-  Take the 1 pill to help treat yeast infection, if it is not better in 4 days, call us back

## 2011-12-26 NOTE — Assessment & Plan Note (Signed)
Patient represents for vaginal itching. Wet mount microscopy performed with Dr. Meredith Pel showed pseudohyphae on KOH prep. The pH was checked and it was approximately 5-6 which could be compatible with bacterial vaginosis, however no clue cells were seen and whiff test was negative. Will treat patient with single dose of fluconazole and if her symptoms are no improved could consider treatment for bacterial vaginosis.

## 2011-12-28 ENCOUNTER — Telehealth: Payer: Self-pay | Admitting: Dietician

## 2011-12-28 NOTE — Telephone Encounter (Signed)
Patient called for advice about soaking feet because they still hurt even though her diabetes is well controlled. Thinks her bunion may also be causing some discomfprt. Blood sugars ~ 100- 114.  Suggested muscle rub cream for feet, discouraged soaking, but advised to test water with hands before putting feet in if she does and to do so for short time. Suggested toe spacers or big toe brace sold at [facility] for bunion pain.

## 2012-01-09 ENCOUNTER — Ambulatory Visit (INDEPENDENT_AMBULATORY_CARE_PROVIDER_SITE_OTHER): Payer: Self-pay | Admitting: Sports Medicine

## 2012-01-09 VITALS — BP 146/80

## 2012-01-09 DIAGNOSIS — M25511 Pain in right shoulder: Secondary | ICD-10-CM | POA: Insufficient documentation

## 2012-01-09 DIAGNOSIS — M25519 Pain in unspecified shoulder: Secondary | ICD-10-CM

## 2012-01-09 NOTE — Progress Notes (Signed)
Patient ID: Lorraine [name], female   DOB: 1961-06-12, 51 y.o.   MRN: [id number]  HPI: Right shoulder pain has not changed.  Stiffness int he morning.  Difficulty with reaching over head, makes pain the worst.  Pain is the worst over posterior shoulder.  Hurts to reach behind her back.  Moving neck also causing trapezius pain on the right.   Patient also with questions about OTC bunion overnight brace-- wondering if it will help her foot pain.  Patient shown how to wear/place brace.  PE: Filed Vitals:   01/09/12 0940  BP: 146/80    Ext: No obvious deformity of right shoulder, no areas of tenderness to palpation.  Full ROM bilateral X IR;  UE; 5/5 strength throughout right and left arms. Negative empty can and Hawkins tests. Decreased internal rotation on right- 4 finger breadths less than left.   RT shoulder Nrmal RC tendons on ultrasound. No impingement noted.  BT is normal Minimal fluid in bursa AC JT shows mild DJD

## 2012-01-09 NOTE — Assessment & Plan Note (Signed)
Minimal changes on [place] and exam  2/2 her pain pattern we will try a CSI  Consent obtained and verified. Sterile prep. Furthur cleansed with alcohol. Topical analgesic spray: Ethyl chloride. Joint: RT shoulder Approached in typical fashion with: Post window Completed without difficulty Meds: 1 cc solumedrol 40 + 4 cc lidocaine 1 % Needle: 25 G and 1.5 in Aftercare instructions and Red flags advised.  Advised to work on ROM Tylenol  This should help 6 to 12 weeks Will RT IM clinic and back to [place] if pain persists

## 2012-01-18 ENCOUNTER — Ambulatory Visit (INDEPENDENT_AMBULATORY_CARE_PROVIDER_SITE_OTHER): Payer: Self-pay | Admitting: Internal Medicine

## 2012-01-18 ENCOUNTER — Encounter: Payer: Self-pay | Admitting: Internal Medicine

## 2012-01-18 VITALS — BP 128/89 | HR 70 | Temp 98.4°F | Ht 63.0 in | Wt 134.2 lb

## 2012-01-18 DIAGNOSIS — N898 Other specified noninflammatory disorders of vagina: Secondary | ICD-10-CM

## 2012-01-18 DIAGNOSIS — L293 Anogenital pruritus, unspecified: Secondary | ICD-10-CM

## 2012-01-18 DIAGNOSIS — E119 Type 2 diabetes mellitus without complications: Secondary | ICD-10-CM

## 2012-01-18 DIAGNOSIS — R300 Dysuria: Secondary | ICD-10-CM

## 2012-01-18 LAB — POCT URINALYSIS DIPSTICK
Glucose, UA: NEGATIVE
Nitrite, UA: NEGATIVE
Protein, UA: 30
Spec Grav, UA: >=1.03
Urobilinogen, UA: 1

## 2012-01-18 LAB — GLUCOSE, CAPILLARY: Glucose-Capillary: 102 mg/dL — ABNORMAL HIGH (ref 70–99)

## 2012-01-18 MED ORDER — METRONIDAZOLE 500 MG PO TABS: 500.0000 mg | ORAL_TABLET | Freq: Two times a day (BID) | ORAL | Status: AC

## 2012-01-18 MED ORDER — FLUCONAZOLE 150 MG PO TABS: 150.0000 mg | ORAL_TABLET | Freq: Once | ORAL | Status: AC

## 2012-01-18 NOTE — Progress Notes (Signed)
Subjective:     Patient ID: Lorraine [name], female   DOB: 1960-09-09, 51 y.o.   MRN: [id number]  HPI 51 year old woman with a history of diabetes who was seen to half weeks ago for acute vaginal discharge and pruritus. Over the past month multiple vaginal exams have been performed with negative GC, Chlamydia, trichomoniasis, Gardnerella, and Candida probes. Last visit, KOH prep was done that showed pseudohyphae. Patient was given 1 dose of fluconazole.  Patient reports she did not receive any relief from the fluconazole, and continues to have the same amount of white discharge with pruritus and some dysuria.  Review of Systems As per history of present illness    Objective:   Physical Exam Filed Vitals:   01/18/12 1320  BP: 128/89  Pulse: 70  Temp: 98.4 F (36.9 C)   Gen: NAD, pleasant, cooperative HEENT: NCAT, EOMI CV: RRR Chest: no respiratory distress, symmetric expansion    Assessment:         Plan:

## 2012-01-18 NOTE — Assessment & Plan Note (Signed)
Patient returns today with continued vaginal itching despite fluconazole treatment after last visit. Also has some dysuria. Urine dip in office does not indicate any infection. Per last note, the patient returned with symptoms we could consider treatment for BV, which we will do. In terms of her yeast infection and pseudohyphae seen on KOH prep last visit, she may have persistent infection despite fluconazole treatment. Constantly, we will treat her for a "complicated" infection despite the fact that her diabetes is well controlled. - Flagyl 500 mg twice a day x7 days - Fluconazole 100 mg x2 doses, space one day apart - Urinalysis is sent to lab- - RTC if symptoms persist

## 2012-01-19 LAB — URINALYSIS, ROUTINE W REFLEX MICROSCOPIC
Leukocytes, UA: NEGATIVE
Nitrite: NEGATIVE
Specific Gravity, Urine: 1.024 (ref 1.005–1.030)
Urobilinogen, UA: 0.2 mg/dL (ref 0.0–1.0)
pH: 5.5 (ref 5.0–8.0)

## 2012-01-26 ENCOUNTER — Other Ambulatory Visit: Payer: Self-pay | Admitting: *Deleted

## 2012-01-26 DIAGNOSIS — M5090 Cervical disc disorder, unspecified, unspecified cervical region: Secondary | ICD-10-CM

## 2012-01-26 DIAGNOSIS — M79605 Pain in left leg: Secondary | ICD-10-CM

## 2012-01-26 DIAGNOSIS — M1990 Unspecified osteoarthritis, unspecified site: Secondary | ICD-10-CM

## 2012-01-26 DIAGNOSIS — M25511 Pain in right shoulder: Secondary | ICD-10-CM

## 2012-01-31 ENCOUNTER — Other Ambulatory Visit: Payer: Self-pay | Admitting: *Deleted

## 2012-01-31 NOTE — Telephone Encounter (Signed)
Pt called c/o of pain low right back since early AM. Wants to see the doctor. Appt given by C. Boone. If any change suggest to go to ER. Stanton Kidney Alexzander Dolinger RN 01/31/12 4:50PM

## 2012-02-04 MED ORDER — METFORMIN HCL 500 MG PO TABS: 1000.0000 mg | ORAL_TABLET | Freq: Two times a day (BID) | ORAL | Status: DC

## 2012-02-06 NOTE — Telephone Encounter (Signed)
Metformin rx called to [facility].

## 2012-02-08 ENCOUNTER — Ambulatory Visit (INDEPENDENT_AMBULATORY_CARE_PROVIDER_SITE_OTHER): Payer: Self-pay | Admitting: Internal Medicine

## 2012-02-08 ENCOUNTER — Encounter: Payer: Self-pay | Admitting: Internal Medicine

## 2012-02-08 VITALS — BP 139/81 | HR 65 | Temp 97.3°F | Ht 63.0 in | Wt 128.0 lb

## 2012-02-08 DIAGNOSIS — E119 Type 2 diabetes mellitus without complications: Secondary | ICD-10-CM

## 2012-02-08 DIAGNOSIS — M549 Dorsalgia, unspecified: Secondary | ICD-10-CM

## 2012-02-08 DIAGNOSIS — E785 Hyperlipidemia, unspecified: Secondary | ICD-10-CM

## 2012-02-08 DIAGNOSIS — K219 Gastro-esophageal reflux disease without esophagitis: Secondary | ICD-10-CM | POA: Insufficient documentation

## 2012-02-08 DIAGNOSIS — I10 Essential (primary) hypertension: Secondary | ICD-10-CM

## 2012-02-08 LAB — GLUCOSE, CAPILLARY: Glucose-Capillary: 121 mg/dL — ABNORMAL HIGH (ref 70–99)

## 2012-02-08 MED ORDER — CYCLOBENZAPRINE HCL 5 MG PO TABS: 5.0000 mg | ORAL_TABLET | Freq: Three times a day (TID) | ORAL | Status: AC | PRN

## 2012-02-08 MED ORDER — PANTOPRAZOLE SODIUM 20 MG PO TBEC: 20.0000 mg | DELAYED_RELEASE_TABLET | Freq: Every day | ORAL | Status: DC
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
## 2012-02-16 ENCOUNTER — Telehealth: Payer: Self-pay | Admitting: *Deleted

## 2012-02-16 ENCOUNTER — Other Ambulatory Visit: Payer: Self-pay | Admitting: *Deleted

## 2012-02-16 DIAGNOSIS — M79605 Pain in left leg: Secondary | ICD-10-CM

## 2012-02-16 DIAGNOSIS — M25511 Pain in right shoulder: Secondary | ICD-10-CM

## 2012-02-16 NOTE — Telephone Encounter (Signed)
Pt calls and states office cancelled her appt this month and she needs to see her dr about her diabetes and yeast she keeps getting, appt per chilonb. 7/17 @ 1315

## 2012-02-21 ENCOUNTER — Ambulatory Visit: Payer: Self-pay | Attending: Sports Medicine

## 2012-02-21 DIAGNOSIS — R5381 Other malaise: Secondary | ICD-10-CM | POA: Insufficient documentation

## 2012-02-21 DIAGNOSIS — M25519 Pain in unspecified shoulder: Secondary | ICD-10-CM | POA: Insufficient documentation

## 2012-02-21 DIAGNOSIS — M25569 Pain in unspecified knee: Secondary | ICD-10-CM | POA: Insufficient documentation

## 2012-02-21 DIAGNOSIS — IMO0001 Reserved for inherently not codable concepts without codable children: Secondary | ICD-10-CM | POA: Insufficient documentation

## 2012-02-22 ENCOUNTER — Ambulatory Visit (INDEPENDENT_AMBULATORY_CARE_PROVIDER_SITE_OTHER): Payer: Self-pay | Admitting: Internal Medicine

## 2012-02-22 ENCOUNTER — Encounter: Payer: Self-pay | Admitting: Internal Medicine

## 2012-02-22 VITALS — BP 138/58 | HR 65 | Temp 97.4°F | Wt 131.6 lb

## 2012-02-22 DIAGNOSIS — I10 Essential (primary) hypertension: Secondary | ICD-10-CM

## 2012-02-22 DIAGNOSIS — E1140 Type 2 diabetes mellitus with diabetic neuropathy, unspecified: Secondary | ICD-10-CM

## 2012-02-22 DIAGNOSIS — N760 Acute vaginitis: Secondary | ICD-10-CM

## 2012-02-22 DIAGNOSIS — E1142 Type 2 diabetes mellitus with diabetic polyneuropathy: Secondary | ICD-10-CM

## 2012-02-22 DIAGNOSIS — M79609 Pain in unspecified limb: Secondary | ICD-10-CM

## 2012-02-22 DIAGNOSIS — L293 Anogenital pruritus, unspecified: Secondary | ICD-10-CM

## 2012-02-22 DIAGNOSIS — F17200 Nicotine dependence, unspecified, uncomplicated: Secondary | ICD-10-CM

## 2012-02-22 DIAGNOSIS — N898 Other specified noninflammatory disorders of vagina: Secondary | ICD-10-CM

## 2012-02-22 DIAGNOSIS — E1149 Type 2 diabetes mellitus with other diabetic neurological complication: Secondary | ICD-10-CM

## 2012-02-22 DIAGNOSIS — M79643 Pain in unspecified hand: Secondary | ICD-10-CM

## 2012-02-22 DIAGNOSIS — E119 Type 2 diabetes mellitus without complications: Secondary | ICD-10-CM

## 2012-02-22 MED ORDER — AGAMATRIX ULTRA-THIN LANCETS MISC: 1.0000 | Freq: Every day | Status: DC

## 2012-02-22 MED ORDER — METFORMIN HCL 1000 MG PO TABS: 1000.0000 mg | ORAL_TABLET | Freq: Two times a day (BID) | ORAL | Status: DC

## 2012-02-22 MED ORDER — GLUCOSE BLOOD VI STRP: ORAL_STRIP | Status: DC

## 2012-02-22 NOTE — Assessment & Plan Note (Signed)
Patient continues to complain of bilateral hand joint pain, and feels that her hands are swollen, though they do not appear so. Has had imaging of her hands in the past, which have appeared normal. She is currently undergoing physical therapy. She has been thought to have trigger finger by sports medicine. Will consider rheumatoid factor and anti-CCP at next visit.

## 2012-02-22 NOTE — Assessment & Plan Note (Signed)
Lab Results  Component Value Date   NA 139 12/07/2011   K 3.9 12/07/2011   CL 101 12/07/2011   CO2 28 12/07/2011   BUN 13 12/07/2011   CREATININE 0.72 12/07/2011   CREATININE 0.70 08/22/2011    BP Readings from Last 3 Encounters:  02/22/12 138/58  02/08/12 139/81  01/18/12 128/89    Assessment: Hypertension control:  controlled  Progress toward goals:  at goal Barriers to meeting goals:  no barriers identified  Plan: Hypertension treatment:  continue current medications (Accuretic 10-12.5, and amlodipine 5), we'll recheck CMET today, as she was on potassium supplementation up until one month ago, and we need to determine if she needs to continue this medication or not

## 2012-02-22 NOTE — Assessment & Plan Note (Signed)
   Patient is not checking CBGs regularly, as she is only on metformin. She is compliant with metformin 1000 mg twice a day. She has still not increase her Neurontin to 1 tab, 1 tab, 2 tabs (300, 300, 600), so I advised her to do so again. We will recheck hemoglobin A1c at next visit.

## 2012-02-22 NOTE — Assessment & Plan Note (Signed)
This has been an ongoing problem for several months. She has undergone several sulconazole treatments, as well as Flagyl for bacterial vaginosis, treated as a complicated infection for 7 days. Today we repeated wet prep and probe for GC and chlamydia. Urinalysis at last visit on 01/18/2012 was unremarkable. May be leukorrhea. She denies new soaps or detergents. She doesn't seem to be douching. She uses Dove bar soap for cleansing.  -Will await results from wet prep and probe to decide treatment. If all is negative, I will refer her to gynecology.

## 2012-02-22 NOTE — Patient Instructions (Addendum)
-  I will call you with the results of your vaginal exam and blood work.  -I am calling in a prescription for metformin 1000 mg twice daily, so when you refill your medication, you only need to take one: The morning and one pill in the evening.  Please be sure to bring all of your medications with you to every visit.  Should you have any new or worsening symptoms, please be sure to call the clinic at [phone].

## 2012-02-22 NOTE — Assessment & Plan Note (Signed)
Continues to smoke one to 2 cigarettes daily. Encouraged smoking cessation.
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
## 2012-02-23 LAB — COMPREHENSIVE METABOLIC PANEL
ALT: 23 U/L (ref 0–35)
AST: 136 U/L — ABNORMAL HIGH (ref 0–37)
Albumin: 3.9 g/dL (ref 3.5–5.2)
CO2: 24 mEq/L (ref 19–32)
Calcium: 9.5 mg/dL (ref 8.4–10.5)
Chloride: 101 mEq/L (ref 96–112)
Creat: 0.57 mg/dL (ref 0.50–1.10)
Potassium: 3.9 mEq/L (ref 3.5–5.3)
Sodium: 136 mEq/L (ref 135–145)
Total Protein: 7.3 g/dL (ref 6.0–8.3)

## 2012-02-24 ENCOUNTER — Encounter: Payer: Self-pay | Admitting: Physical Therapy

## 2012-02-28 ENCOUNTER — Ambulatory Visit: Payer: Self-pay | Admitting: Physical Therapy

## 2012-02-29 ENCOUNTER — Telehealth: Payer: Self-pay | Admitting: Internal Medicine

## 2012-02-29 ENCOUNTER — Encounter: Payer: Self-pay | Admitting: Internal Medicine

## 2012-02-29 DIAGNOSIS — M79673 Pain in unspecified foot: Secondary | ICD-10-CM

## 2012-02-29 DIAGNOSIS — N898 Other specified noninflammatory disorders of vagina: Secondary | ICD-10-CM

## 2012-02-29 DIAGNOSIS — M79643 Pain in unspecified hand: Secondary | ICD-10-CM

## 2012-02-29 NOTE — Telephone Encounter (Signed)
Called patient on 02/29/2012 at 3 PM to inform her that the results from her vaginal exam were normal without candida, trichomonas, Gardnerella, Chlamydia and gonorrhea. She tells me that she continues to have persistent white discharge, and only uses Dove bar soap.  She also complains of persistent hand and feet joint pain and swelling. Extremities have never appeared swollen during office examination. Imaging of her hands have been normal. Cervical spine MRI suggests some shallow disc protrusion at C7-T1 and T1-T2, but she does not describe pain as numbness or tingling as much is just painful and swollen.  -Refer to gynecology -Asked patient to return for blood work. We will draw rheumatoid factor, anti-CCP, ESR and a CRP

## 2012-03-01 ENCOUNTER — Ambulatory Visit: Payer: Self-pay

## 2012-03-02 ENCOUNTER — Other Ambulatory Visit (INDEPENDENT_AMBULATORY_CARE_PROVIDER_SITE_OTHER): Payer: Self-pay

## 2012-03-02 DIAGNOSIS — M79643 Pain in unspecified hand: Secondary | ICD-10-CM

## 2012-03-02 DIAGNOSIS — M79673 Pain in unspecified foot: Secondary | ICD-10-CM

## 2012-03-02 DIAGNOSIS — M79609 Pain in unspecified limb: Secondary | ICD-10-CM

## 2012-03-02 LAB — C-REACTIVE PROTEIN: CRP: <0.5 mg/dL (ref <0.60)

## 2012-03-03 ENCOUNTER — Emergency Department (HOSPITAL_COMMUNITY): Payer: Medicaid Other

## 2012-03-03 ENCOUNTER — Encounter (HOSPITAL_COMMUNITY): Payer: Self-pay | Admitting: Emergency Medicine

## 2012-03-03 ENCOUNTER — Emergency Department (HOSPITAL_COMMUNITY)
Admission: EM | Admit: 2012-03-03 | Discharge: 2012-03-03 | Disposition: A | Discharge status: Home / Self Care | Payer: Medicaid Other | Attending: Emergency Medicine | Admitting: Emergency Medicine

## 2012-03-03 DIAGNOSIS — I10 Essential (primary) hypertension: Secondary | ICD-10-CM | POA: Insufficient documentation

## 2012-03-03 DIAGNOSIS — Z90710 Acquired absence of both cervix and uterus: Secondary | ICD-10-CM | POA: Insufficient documentation

## 2012-03-03 DIAGNOSIS — Z8619 Personal history of other infectious and parasitic diseases: Secondary | ICD-10-CM | POA: Insufficient documentation

## 2012-03-03 DIAGNOSIS — E119 Type 2 diabetes mellitus without complications: Secondary | ICD-10-CM | POA: Insufficient documentation

## 2012-03-03 DIAGNOSIS — Z833 Family history of diabetes mellitus: Secondary | ICD-10-CM | POA: Insufficient documentation

## 2012-03-03 DIAGNOSIS — B182 Chronic viral hepatitis C: Secondary | ICD-10-CM | POA: Insufficient documentation

## 2012-03-03 DIAGNOSIS — F17200 Nicotine dependence, unspecified, uncomplicated: Secondary | ICD-10-CM | POA: Insufficient documentation

## 2012-03-03 DIAGNOSIS — M19041 Primary osteoarthritis, right hand: Secondary | ICD-10-CM

## 2012-03-03 DIAGNOSIS — M19049 Primary osteoarthritis, unspecified hand: Secondary | ICD-10-CM | POA: Insufficient documentation

## 2012-03-03 MED ORDER — MELOXICAM 7.5 MG PO TABS: 7.5000 mg | ORAL_TABLET | Freq: Every day | ORAL | Status: DC

## 2012-03-03 NOTE — ED Provider Notes (Signed)
Medical screening examination/treatment/procedure(s) were performed by non-physician practitioner and as supervising physician I was immediately available for consultation/collaboration.   Cyndra Numbers, MD 03/03/12 1114

## 2012-03-03 NOTE — ED Notes (Signed)
Pt c/o right hand pain for "months", denies known injury, states pain became worse over the past several days.
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
## 2012-03-06 ENCOUNTER — Ambulatory Visit: Payer: Self-pay

## 2012-03-06 ENCOUNTER — Telehealth: Payer: Self-pay | Admitting: *Deleted

## 2012-03-06 NOTE — Telephone Encounter (Signed)
Agree, if reoccurs make note of any medication taken prior to rash, take benadryl 25 mg po x 1, and call clinic. If worsens significantly may go to ED.

## 2012-03-06 NOTE — Telephone Encounter (Signed)
Pt called stating 2 days ago she increased her metformin to 1000 mg bid.  ( She was taking metformin 500 mg, 2 pills bid ) Last night she noted  bumps on arms and legs and itching lasting until she drank milk.  No other causes of irritation, no new foods, detergent etc.  I advised pt to take med again tonight see if itching returns.  I am not sure metformin was the cause of itching as it resolved with a glass of milk. Pt will call if problem continues. Is this okay?

## 2012-03-06 NOTE — Telephone Encounter (Signed)
Pt informed

## 2012-03-08 ENCOUNTER — Encounter: Payer: Self-pay | Admitting: Physical Therapy

## 2012-03-08 ENCOUNTER — Encounter: Payer: Self-pay | Admitting: Obstetrics and Gynecology

## 2012-03-09 ENCOUNTER — Encounter: Payer: Self-pay | Admitting: Physical Therapy

## 2012-03-13 ENCOUNTER — Ambulatory Visit: Payer: Self-pay | Attending: Sports Medicine | Admitting: Physical Therapy

## 2012-03-13 DIAGNOSIS — M25569 Pain in unspecified knee: Secondary | ICD-10-CM | POA: Insufficient documentation

## 2012-03-13 DIAGNOSIS — IMO0001 Reserved for inherently not codable concepts without codable children: Secondary | ICD-10-CM | POA: Insufficient documentation

## 2012-03-13 DIAGNOSIS — M25519 Pain in unspecified shoulder: Secondary | ICD-10-CM | POA: Insufficient documentation

## 2012-03-13 DIAGNOSIS — R5381 Other malaise: Secondary | ICD-10-CM | POA: Insufficient documentation

## 2012-03-15 ENCOUNTER — Ambulatory Visit: Payer: Self-pay

## 2012-03-16 ENCOUNTER — Encounter: Payer: Self-pay | Admitting: Physical Therapy

## 2012-03-19 ENCOUNTER — Other Ambulatory Visit: Payer: Self-pay | Admitting: Internal Medicine

## 2012-03-19 DIAGNOSIS — M069 Rheumatoid arthritis, unspecified: Secondary | ICD-10-CM

## 2012-03-19 DIAGNOSIS — R768 Other specified abnormal immunological findings in serum: Secondary | ICD-10-CM

## 2012-03-19 DIAGNOSIS — B181 Chronic viral hepatitis B without delta-agent: Secondary | ICD-10-CM

## 2012-03-19 NOTE — Progress Notes (Signed)
Discussed elevated RF and anti CCP with patient - diagnosis suggestive of RA.    Discussed with Dr. Nickola Major, and since patient has h/o HCV and HBV, mgmt is limited and complicated.  Patient will need to be followed by hepatologist and rheumatologist for appropriate treatment.   -Refer to [facility] for hepatitis and rheumatology clinics.

## 2012-03-23 ENCOUNTER — Ambulatory Visit: Payer: Self-pay

## 2012-04-03 ENCOUNTER — Ambulatory Visit (INDEPENDENT_AMBULATORY_CARE_PROVIDER_SITE_OTHER): Payer: Self-pay | Admitting: Internal Medicine

## 2012-04-03 ENCOUNTER — Encounter: Payer: Self-pay | Admitting: Internal Medicine

## 2012-04-03 VITALS — BP 138/81 | HR 76 | Temp 97.6°F | Ht 63.0 in | Wt 126.3 lb

## 2012-04-03 DIAGNOSIS — Z79899 Other long term (current) drug therapy: Secondary | ICD-10-CM

## 2012-04-03 DIAGNOSIS — E781 Pure hyperglyceridemia: Secondary | ICD-10-CM

## 2012-04-03 DIAGNOSIS — E1149 Type 2 diabetes mellitus with other diabetic neurological complication: Secondary | ICD-10-CM

## 2012-04-03 DIAGNOSIS — E1142 Type 2 diabetes mellitus with diabetic polyneuropathy: Secondary | ICD-10-CM

## 2012-04-03 DIAGNOSIS — E1140 Type 2 diabetes mellitus with diabetic neuropathy, unspecified: Secondary | ICD-10-CM

## 2012-04-03 DIAGNOSIS — I10 Essential (primary) hypertension: Secondary | ICD-10-CM

## 2012-04-03 DIAGNOSIS — M069 Rheumatoid arthritis, unspecified: Secondary | ICD-10-CM

## 2012-04-03 MED ORDER — PRAVASTATIN SODIUM 40 MG PO TABS: 40.0000 mg | ORAL_TABLET | Freq: Every evening | ORAL | Status: DC

## 2012-04-03 MED ORDER — QUINAPRIL-HYDROCHLOROTHIAZIDE 10-12.5 MG PO TABS: 2.0000 | ORAL_TABLET | Freq: Every day | ORAL | Status: DC

## 2012-04-03 NOTE — Assessment & Plan Note (Signed)
Lab Results  Component Value Date   NA 136 02/22/2012   K 3.9 02/22/2012   CL 101 02/22/2012   CO2 24 02/22/2012   BUN 7 02/22/2012   CREATININE 0.57 02/22/2012   CREATININE 0.70 08/22/2011    BP Readings from Last 3 Encounters:  04/03/12 138/81  03/03/12 171/96  02/22/12 138/58    Assessment: Hypertension control:  controlled  Progress toward goals:  at goal Barriers to meeting goals:  no barriers identified  Plan: Hypertension treatment:  continue current medications

## 2012-04-03 NOTE — Patient Instructions (Addendum)
-  Continue taking all of your medications - you are doing great!  -We are going to try to get your records from [facility].  Be sure to go to the Liver doctor's appointment in October.  Please be sure to bring all of your medications with you to every visit.  Should you have any new or worsening symptoms, please be sure to call the clinic at [phone].

## 2012-04-03 NOTE — Assessment & Plan Note (Signed)
Lab Results  Component Value Date   HGBA1C 5.5 04/03/2012   CREATININE 0.57 02/22/2012   CREATININE 0.70 08/22/2011   MICROALBUR 0.57 09/21/2011   MICRALBCREAT 8.1 09/21/2011   CHOL 150 09/21/2011   HDL 59 09/21/2011   TRIG 126 09/21/2011    Assessment: Diabetes control: controlled Progress toward goals: at goal Barriers to meeting goals: no barriers identified  Plan: Diabetes treatment: continue current medications Refer to: none Instruction/counseling given: reminded to bring blood glucose meter & log to each visit and reminded to bring medications to each visit
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
## 2012-04-03 NOTE — Assessment & Plan Note (Addendum)
Patient went to rheumatology clinic at [facility] on 03/21/2012. We are requesting records from there. She is also scheduled for a hepatology appointment 03/23/2012 for concurrent management of her RA  -I have apprised her that should she continue to have pain, and need Mobic, she can call the clinic for refill so that she may avoid an emergency department visit  -In the future, we may need to consider starting this patient on chronic narcotics if her rheumatoid arthritis cannot be managed because of her liver failure

## 2012-04-04 ENCOUNTER — Encounter: Payer: Self-pay | Admitting: Obstetrics and Gynecology

## 2012-04-04 ENCOUNTER — Ambulatory Visit (INDEPENDENT_AMBULATORY_CARE_PROVIDER_SITE_OTHER): Payer: Self-pay | Admitting: Obstetrics and Gynecology

## 2012-04-04 VITALS — BP 132/91 | HR 72 | Temp 98.0°F | Ht 63.0 in | Wt 125.3 lb

## 2012-04-04 DIAGNOSIS — N760 Acute vaginitis: Secondary | ICD-10-CM

## 2012-04-04 MED ORDER — FLUCONAZOLE 150 MG PO TABS: 150.0000 mg | ORAL_TABLET | Freq: Once | ORAL | Status: AC

## 2012-04-04 NOTE — Progress Notes (Signed)
Patient ID: Lorraine [name], female   DOB: September 09, 1960, 51 y.o.   MRN: [id number] 51 yo G3P3 presenting today complaining of a yeast infection. Patient reports experiencing vaginal pruritis and discharge for the past two months. She was treated with a 7-day regimen of monistat and has taken diflucan a month ago. Patient states she felt some relief after each treatment but it seems to come back.  SSE: thick white adherent vaginal discharge. Normal vaginal mucosa and cervix  A/P 51 yo with clinical yeast infection - wet prep collected - Rx diflucan provided - patient will be contacted with any abnormal results

## 2012-04-05 LAB — WET PREP, GENITAL: Trich, Wet Prep: NONE SEEN

## 2012-04-25 ENCOUNTER — Encounter: Payer: Self-pay | Admitting: Internal Medicine

## 2012-04-25 ENCOUNTER — Ambulatory Visit (INDEPENDENT_AMBULATORY_CARE_PROVIDER_SITE_OTHER): Payer: Self-pay | Admitting: Internal Medicine

## 2012-04-25 VITALS — BP 126/88 | HR 62 | Temp 97.6°F | Ht 63.0 in | Wt 125.0 lb

## 2012-04-25 DIAGNOSIS — Z23 Encounter for immunization: Secondary | ICD-10-CM

## 2012-04-25 DIAGNOSIS — M069 Rheumatoid arthritis, unspecified: Secondary | ICD-10-CM

## 2012-04-25 DIAGNOSIS — Z0000 Encounter for general adult medical examination without abnormal findings: Secondary | ICD-10-CM | POA: Insufficient documentation

## 2012-04-25 NOTE — Progress Notes (Signed)
HPI: [name] is a 51 yo woman with RA, HTN, DM presents today for follow up on her hand Xrays that was done at [facility] last month.  Per Dr. Glean Hess note, her labs and xrays are consistent with RA.  She will need to see hepatologist in October prior to RA treatment- likely Plaquenil as she is not a candidate for MTX given her hx of HCV and HBV.  She states that she continues to have right and shoulder pain as well as 1 hour stiffness in the morning.  She has been taking Mobic and Neurontin and symptoms are bearable.  She has some numbness and tingling on her right foot.  She would like a flu shot today. No other complaints.  ROS: as per HPI  PE: General: alert, thin appearing, and cooperative to examination.  Lungs: normal respiratory effort, no accessory muscle use, normal breath sounds, no crackles, and no wheezes. Heart: normal rate, regular rhythm, no murmur, no gallop, and no rub.  Abdomen: soft, non-tender, normal bowel sounds, no distention, no guarding, no rebound tenderness Msk: right wrist: mild edema and tenderness to palpation, and warmth.  Tenderness to palpation of her distal IP joints and MCP joints of the right hand.  Limited ROM on active and passive movement due to tenderness. Can abduct to 60 degrees on right shoulder. Left shoulder and wrist: unremarkable. Left foot: no edema or erythema noted. Pulses: 2+ DP/PT pulses bilaterally Extremities: No cyanosis, clubbing, edema Neurologic: nonfocal

## 2012-04-25 NOTE — Addendum Note (Signed)
Addended by: Angelina Ok F on: 04/25/2012 11:18 AM   Modules accepted: Orders

## 2012-04-25 NOTE — Assessment & Plan Note (Addendum)
We reviewed her Xrays results per Dr. Osborne Oman note which states that her labs and Xrays are consistent with RA.  She is awaiting to see hepatology in Oct prior to starting treatment for her RA--likely Plaquenil.  She is not a candidate for MTX given her hx of HCV and HBV.  I think her numbness and tingling in her right foot is likely due to her DM neuropathy. -Continue  Mobic and neurontin as prescribed by her PCP -If pain is not well controlled, we may increase Mobic from 7.5mg  to 15mg  next visit

## 2012-04-25 NOTE — Patient Instructions (Addendum)
Continue current medications as prescribed Make sure you go to your appointment with the liver doctor in October Follow up with your primary care doctor in 1-2 months

## 2012-04-25 NOTE — Assessment & Plan Note (Signed)
Flu vaccine given today. 

## 2012-04-30 ENCOUNTER — Other Ambulatory Visit: Payer: Self-pay | Admitting: Internal Medicine

## 2012-04-30 DIAGNOSIS — Z1231 Encounter for screening mammogram for malignant neoplasm of breast: Secondary | ICD-10-CM

## 2012-05-11 ENCOUNTER — Emergency Department (HOSPITAL_COMMUNITY)
Admission: EM | Admit: 2012-05-11 | Discharge: 2012-05-11 | Disposition: A | Discharge status: Home / Self Care | Payer: Medicaid Other | Attending: Emergency Medicine | Admitting: Emergency Medicine

## 2012-05-11 ENCOUNTER — Other Ambulatory Visit: Payer: Self-pay

## 2012-05-11 ENCOUNTER — Emergency Department (HOSPITAL_COMMUNITY): Payer: Medicaid Other

## 2012-05-11 ENCOUNTER — Encounter (HOSPITAL_COMMUNITY): Payer: Self-pay

## 2012-05-11 DIAGNOSIS — E119 Type 2 diabetes mellitus without complications: Secondary | ICD-10-CM | POA: Insufficient documentation

## 2012-05-11 DIAGNOSIS — B1920 Unspecified viral hepatitis C without hepatic coma: Secondary | ICD-10-CM | POA: Insufficient documentation

## 2012-05-11 DIAGNOSIS — F17200 Nicotine dependence, unspecified, uncomplicated: Secondary | ICD-10-CM | POA: Insufficient documentation

## 2012-05-11 DIAGNOSIS — J019 Acute sinusitis, unspecified: Secondary | ICD-10-CM | POA: Insufficient documentation

## 2012-05-11 DIAGNOSIS — R062 Wheezing: Secondary | ICD-10-CM | POA: Insufficient documentation

## 2012-05-11 DIAGNOSIS — R059 Cough, unspecified: Secondary | ICD-10-CM | POA: Insufficient documentation

## 2012-05-11 DIAGNOSIS — Z79899 Other long term (current) drug therapy: Secondary | ICD-10-CM | POA: Insufficient documentation

## 2012-05-11 DIAGNOSIS — I10 Essential (primary) hypertension: Secondary | ICD-10-CM | POA: Insufficient documentation

## 2012-05-11 DIAGNOSIS — R51 Headache: Secondary | ICD-10-CM | POA: Insufficient documentation

## 2012-05-11 DIAGNOSIS — J3489 Other specified disorders of nose and nasal sinuses: Secondary | ICD-10-CM | POA: Insufficient documentation

## 2012-05-11 DIAGNOSIS — R079 Chest pain, unspecified: Secondary | ICD-10-CM | POA: Insufficient documentation

## 2012-05-11 DIAGNOSIS — R0982 Postnasal drip: Secondary | ICD-10-CM | POA: Insufficient documentation

## 2012-05-11 DIAGNOSIS — J209 Acute bronchitis, unspecified: Secondary | ICD-10-CM | POA: Insufficient documentation

## 2012-05-11 DIAGNOSIS — B1910 Unspecified viral hepatitis B without hepatic coma: Secondary | ICD-10-CM | POA: Insufficient documentation

## 2012-05-11 DIAGNOSIS — R05 Cough: Secondary | ICD-10-CM | POA: Insufficient documentation

## 2012-05-11 LAB — BASIC METABOLIC PANEL
Calcium: 9.4 mg/dL (ref 8.4–10.5)
Creatinine, Ser: 0.53 mg/dL (ref 0.50–1.10)
GFR calc Af Amer: >90 mL/min (ref >90)
GFR calc non Af Amer: >90 mL/min (ref >90)

## 2012-05-11 LAB — CBC
HCT: 41.1 % (ref 36.0–46.0)
Hemoglobin: 13.9 g/dL (ref 12.0–15.0)
MCH: 30.3 pg (ref 26.0–34.0)
MCHC: 33.8 g/dL (ref 30.0–36.0)
MCV: 89.7 fL (ref 78.0–100.0)
Platelets: 346 10*3/uL (ref 150–400)
RBC: 4.58 MIL/uL (ref 3.87–5.11)
RDW: 13.4 % (ref 11.5–15.5)
WBC: 6.5 10*3/uL (ref 4.0–10.5)

## 2012-05-11 LAB — BASIC METABOLIC PANEL WITH GFR
BUN: 6 mg/dL (ref 6–23)
CO2: 26 meq/L (ref 19–32)
Chloride: 99 meq/L (ref 96–112)
Glucose, Bld: 121 mg/dL — ABNORMAL HIGH (ref 70–99)
Potassium: 3.3 meq/L — ABNORMAL LOW (ref 3.5–5.1)
Sodium: 135 meq/L (ref 135–145)

## 2012-05-11 LAB — POCT I-STAT TROPONIN I: Troponin i, poc: 0 ng/mL (ref 0.00–0.08)

## 2012-05-11 MED ORDER — ALBUTEROL SULFATE HFA 108 (90 BASE) MCG/ACT IN AERS
2.0000 | INHALATION_SPRAY | RESPIRATORY_TRACT | Status: DC | PRN
  Administered 2012-05-11: 2 via RESPIRATORY_TRACT
  Filled 2012-05-11: qty 6.7

## 2012-05-11 MED ORDER — AZITHROMYCIN 250 MG PO TABS: 250.0000 mg | ORAL_TABLET | Freq: Every day | ORAL | Status: DC

## 2012-05-11 MED ORDER — POTASSIUM CHLORIDE CRYS ER 20 MEQ PO TBCR
20.0000 meq | EXTENDED_RELEASE_TABLET | Freq: Once | ORAL | Status: AC
  Administered 2012-05-11: 20 meq via ORAL
  Filled 2012-05-11: qty 1

## 2012-05-11 MED ORDER — HYDROCOD POLST-CHLORPHEN POLST 10-8 MG/5ML PO LQCR: 5.0000 mL | Freq: Two times a day (BID) | ORAL | Status: DC | PRN
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
## 2012-05-11 NOTE — ED Notes (Signed)
Pt reports headache, productive cough with white mucus and dizzy x 3 days, been exposed to sick kids, also reports chest pain only during coughing, chest tender at palpation, no N/V/D, no sob. Pt alert, oriented, NAD.

## 2012-05-14 LAB — GLUCOSE, CAPILLARY: Glucose-Capillary: 119 mg/dL — ABNORMAL HIGH (ref 70–99)

## 2012-05-15 ENCOUNTER — Telehealth: Payer: Self-pay | Admitting: *Deleted

## 2012-05-15 NOTE — ED Provider Notes (Signed)
Medical screening examination/treatment/procedure(s) were performed by non-physician practitioner and as supervising physician I was immediately available for consultation/collaboration.   Gavin Pound. Genee Rann, MD 05/15/12 1610

## 2012-05-15 NOTE — Telephone Encounter (Signed)
Pt calls and ask for ED f/u from 10/4, states she is no better and needs to be seen, appt given for 10/9 at 1530 dr Everardo Beals per sharonb. States she was seen for "flu like symptoms"

## 2012-05-16 ENCOUNTER — Ambulatory Visit (INDEPENDENT_AMBULATORY_CARE_PROVIDER_SITE_OTHER): Payer: Self-pay | Admitting: Internal Medicine

## 2012-05-16 ENCOUNTER — Encounter: Payer: Self-pay | Admitting: Internal Medicine

## 2012-05-16 VITALS — BP 135/94 | HR 68 | Temp 97.3°F | Wt 126.4 lb

## 2012-05-16 DIAGNOSIS — J069 Acute upper respiratory infection, unspecified: Secondary | ICD-10-CM

## 2012-05-16 DIAGNOSIS — J329 Chronic sinusitis, unspecified: Secondary | ICD-10-CM | POA: Insufficient documentation

## 2012-05-16 DIAGNOSIS — J019 Acute sinusitis, unspecified: Secondary | ICD-10-CM

## 2012-05-16 MED ORDER — AMOXICILLIN 500 MG PO CAPS: 500.0000 mg | ORAL_CAPSULE | Freq: Two times a day (BID) | ORAL | Status: AC

## 2012-05-16 NOTE — Patient Instructions (Signed)
-  Take Amoxicillin 500mg  twice daily for 7 days  -Get lots of rest and fluids!  Please be sure to bring all of your medications with you to every visit.  Should you have any new or worsening symptoms, please be sure to call the clinic at [phone].  Sinusitis Sinusitis an infection of the air pockets (sinuses) in your face. This can cause puffiness (swelling). It can also cause drainage from your sinuses.  HOME CARE   Only take medicine as told by your doctor.  Drink enough fluids to keep your pee (urine) clear or pale yellow.  Apply moist heat or ice packs for pain relief.  Use salt (saline) nose sprays. The spray will wet the thick fluid in the nose. This can help the sinuses drain. GET HELP RIGHT AWAY IF:   You have a fever.  Your baby is older than 3 months with a rectal temperature of 102 F (38.9 C) or higher.  Your baby is 88 months old or younger with a rectal temperature of 100.4 F (38 C) or higher.  The pain gets worse.  You get a very bad headache.  You keep throwing up (vomiting).  Your face gets puffy. MAKE SURE YOU:   Understand these instructions.  Will watch your condition.  Will get help right away if you are not doing well or get worse. Document Released: 01/11/2008 Document Revised: 10/17/2011 Document Reviewed: 01/11/2008 [facility] Patient Information 2013 [place], [place].
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
## 2012-05-16 NOTE — Assessment & Plan Note (Signed)
Given duration of symptoms and history of DM, will treat with amoxicillin 500 bid x 7 days.  Choosing amoxicillin over augmentin given cost.

## 2012-05-23 DIAGNOSIS — M069 Rheumatoid arthritis, unspecified: Secondary | ICD-10-CM | POA: Insufficient documentation

## 2012-05-23 DIAGNOSIS — B1920 Unspecified viral hepatitis C without hepatic coma: Secondary | ICD-10-CM | POA: Insufficient documentation

## 2012-05-23 DIAGNOSIS — E119 Type 2 diabetes mellitus without complications: Secondary | ICD-10-CM | POA: Insufficient documentation

## 2012-05-24 ENCOUNTER — Ambulatory Visit (HOSPITAL_COMMUNITY)
Admission: RE | Admit: 2012-05-24 | Discharge: 2012-05-24 | Disposition: A | Discharge status: Home / Self Care | Payer: No Typology Code available for payment source | Source: Ambulatory Visit | Attending: Internal Medicine | Admitting: Internal Medicine

## 2012-05-24 DIAGNOSIS — Z1231 Encounter for screening mammogram for malignant neoplasm of breast: Secondary | ICD-10-CM | POA: Insufficient documentation

## 2012-05-29 ENCOUNTER — Other Ambulatory Visit: Payer: Self-pay | Admitting: Internal Medicine

## 2012-05-29 DIAGNOSIS — R928 Other abnormal and inconclusive findings on diagnostic imaging of breast: Secondary | ICD-10-CM

## 2012-05-31 ENCOUNTER — Ambulatory Visit
Admission: RE | Admit: 2012-05-31 | Discharge: 2012-05-31 | Disposition: A | Discharge status: Home / Self Care | Payer: Self-pay | Source: Ambulatory Visit | Attending: Internal Medicine | Admitting: Internal Medicine

## 2012-05-31 DIAGNOSIS — R928 Other abnormal and inconclusive findings on diagnostic imaging of breast: Secondary | ICD-10-CM

## 2012-06-26 ENCOUNTER — Other Ambulatory Visit: Payer: Self-pay | Admitting: *Deleted

## 2012-07-11 ENCOUNTER — Other Ambulatory Visit (HOSPITAL_COMMUNITY)
Admission: RE | Admit: 2012-07-11 | Discharge: 2012-07-11 | Disposition: A | Discharge status: Home / Self Care | Payer: No Typology Code available for payment source | Source: Ambulatory Visit | Attending: Internal Medicine | Admitting: Internal Medicine

## 2012-07-11 ENCOUNTER — Ambulatory Visit (INDEPENDENT_AMBULATORY_CARE_PROVIDER_SITE_OTHER): Payer: No Typology Code available for payment source | Admitting: Internal Medicine

## 2012-07-11 ENCOUNTER — Encounter: Payer: Self-pay | Admitting: Internal Medicine

## 2012-07-11 VITALS — BP 130/83 | HR 64 | Temp 98.4°F | Wt 128.9 lb

## 2012-07-11 DIAGNOSIS — E1140 Type 2 diabetes mellitus with diabetic neuropathy, unspecified: Secondary | ICD-10-CM

## 2012-07-11 DIAGNOSIS — N899 Noninflammatory disorder of vagina, unspecified: Secondary | ICD-10-CM

## 2012-07-11 DIAGNOSIS — K219 Gastro-esophageal reflux disease without esophagitis: Secondary | ICD-10-CM

## 2012-07-11 DIAGNOSIS — E1149 Type 2 diabetes mellitus with other diabetic neurological complication: Secondary | ICD-10-CM

## 2012-07-11 DIAGNOSIS — Z79899 Other long term (current) drug therapy: Secondary | ICD-10-CM

## 2012-07-11 DIAGNOSIS — N760 Acute vaginitis: Secondary | ICD-10-CM | POA: Insufficient documentation

## 2012-07-11 DIAGNOSIS — M79673 Pain in unspecified foot: Secondary | ICD-10-CM

## 2012-07-11 DIAGNOSIS — M069 Rheumatoid arthritis, unspecified: Secondary | ICD-10-CM

## 2012-07-11 DIAGNOSIS — Z113 Encounter for screening for infections with a predominantly sexual mode of transmission: Secondary | ICD-10-CM | POA: Insufficient documentation

## 2012-07-11 DIAGNOSIS — N898 Other specified noninflammatory disorders of vagina: Secondary | ICD-10-CM

## 2012-07-11 DIAGNOSIS — E1142 Type 2 diabetes mellitus with diabetic polyneuropathy: Secondary | ICD-10-CM

## 2012-07-11 DIAGNOSIS — I10 Essential (primary) hypertension: Secondary | ICD-10-CM

## 2012-07-11 LAB — BASIC METABOLIC PANEL
BUN: 8 mg/dL (ref 6–23)
Chloride: 100 mEq/L (ref 96–112)
Glucose, Bld: 71 mg/dL (ref 70–99)
Potassium: 3.9 mEq/L (ref 3.5–5.3)
Sodium: 138 mEq/L (ref 135–145)

## 2012-07-11 LAB — POCT URINALYSIS DIPSTICK
Bilirubin, UA: NEGATIVE
Blood, UA: NEGATIVE
Glucose, UA: NEGATIVE
Spec Grav, UA: >=1.03

## 2012-07-11 MED ORDER — MELOXICAM 7.5 MG PO TABS: 7.5000 mg | ORAL_TABLET | Freq: Every day | ORAL | Status: DC

## 2012-07-11 MED ORDER — OMEPRAZOLE 20 MG PO CPDR: 20.0000 mg | DELAYED_RELEASE_CAPSULE | Freq: Every day | ORAL | Status: DC

## 2012-07-11 NOTE — Patient Instructions (Addendum)
-  You have been taking gabapentin 300mg  three times daily - we are going to taper this medication down.  For the next week, take 300mg  twice daily.  Then for the following week, take 300mg  once daily, then you may discontinue this medication  -You may increase mobic to up to 15mg  daily if significant pain, but do not take more than 15mg  daily. If you start to have stomach pain, please call the clinic.  Start taking prilosec 20mg  daily to protect your stomach.  Please be sure to bring all of your medications with you to every visit.  Should you have any new or worsening symptoms, please be sure to call the clinic at [phone].

## 2012-07-12 DIAGNOSIS — N898 Other specified noninflammatory disorders of vagina: Secondary | ICD-10-CM | POA: Insufficient documentation

## 2012-07-12 NOTE — Assessment & Plan Note (Addendum)
Patient is still awaiting further mgmt from [facility].  For now, we will increase mobic to 7.5-15mg  daily, not to exceed 15mg  daily.  Will need to retrieve most recent records from [facility].  At one point, pain was thought to be secondary to neuropathy, but diagnosis of RA has since been made.  Will thus taper off gabapentin.

## 2012-07-12 NOTE — Assessment & Plan Note (Signed)
Lab Results  Component Value Date   HGBA1C 6.0 07/11/2012   CREATININE 0.56 07/11/2012   CREATININE 0.53 05/11/2012   MICROALBUR 0.57 09/21/2011   MICRALBCREAT 8.1 09/21/2011   CHOL 150 09/21/2011   HDL 59 09/21/2011   TRIG 126 09/21/2011     Assessment: Diabetes control: controlled Progress toward goals: at goal Barriers to meeting goals: no barriers identified  Plan: Diabetes treatment: continue current medications Refer to: none Instruction/counseling given: reminded to bring blood glucose meter & log to each visit and reminded to bring medications to each visit
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
## 2012-07-12 NOTE — Assessment & Plan Note (Signed)
Will restart PPI in setting of increasing NSAID dose

## 2012-07-12 NOTE — Assessment & Plan Note (Signed)
Lab Results  Component Value Date   NA 138 07/11/2012   K 3.9 07/11/2012   CL 100 07/11/2012   CO2 25 07/11/2012   BUN 8 07/11/2012   CREATININE 0.56 07/11/2012   CREATININE 0.53 05/11/2012    BP Readings from Last 3 Encounters:  07/11/12 130/83  05/16/12 135/94  05/11/12 140/78    Assessment: Hypertension control:  controlled  Progress toward goals:  at goal Barriers to meeting goals:  no barriers identified  Plan: Hypertension treatment:  continue current medications
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
## 2012-07-19 MED ORDER — ESTRADIOL 0.1 MG/GM VA CREA: 2.0000 g | TOPICAL_CREAM | Freq: Every day | VAGINAL | Status: DC

## 2012-07-19 NOTE — Addendum Note (Signed)
Addended by: Belia Heman on: 07/19/2012 05:15 PM   Modules accepted: Orders

## 2012-07-23 ENCOUNTER — Telehealth: Payer: Self-pay | Admitting: *Deleted

## 2012-07-23 NOTE — Telephone Encounter (Signed)
Pt called - past few days both hands are red and painfull. Also noted small knot left 5th finger during this time. Appt made 07/25/12 2:30AM Dr Bosie Clos. Stanton Kidney Itsel Opfer RN 07/23/12 11:30AM

## 2012-07-25 ENCOUNTER — Ambulatory Visit (INDEPENDENT_AMBULATORY_CARE_PROVIDER_SITE_OTHER): Payer: No Typology Code available for payment source | Admitting: Internal Medicine

## 2012-07-25 VITALS — BP 146/84 | HR 67 | Temp 97.5°F | Wt 125.5 lb

## 2012-07-25 DIAGNOSIS — Z8619 Personal history of other infectious and parasitic diseases: Secondary | ICD-10-CM

## 2012-07-25 DIAGNOSIS — L538 Other specified erythematous conditions: Secondary | ICD-10-CM | POA: Insufficient documentation

## 2012-07-25 DIAGNOSIS — L530 Toxic erythema: Secondary | ICD-10-CM

## 2012-07-25 DIAGNOSIS — M069 Rheumatoid arthritis, unspecified: Secondary | ICD-10-CM

## 2012-07-25 DIAGNOSIS — B182 Chronic viral hepatitis C: Secondary | ICD-10-CM

## 2012-07-25 DIAGNOSIS — M06342 Rheumatoid nodule, left hand: Secondary | ICD-10-CM | POA: Insufficient documentation

## 2012-07-25 NOTE — Patient Instructions (Addendum)
An appointment with the rheumatology doctor will be scheduled. It is important to keep this appointment.   Rheumatoid Arthritis Rheumatoid arthritis is a long-term (chronic) inflammatory disease that causes pain, swelling, and stiffness of the joints. It can affect the entire body, including the eyes and lungs. The effects of rheumatoid arthritis vary widely among those with the condition. CAUSES  The cause of rheumatoid arthritis is not known. It tends to run in families and is more common in women. Certain cells of the body's natural defense system (immune system) do not work properly and begin to attack healthy joints. It primarily involves the connective tissue that lines the joints (synovial membrane). This can cause damage to the joint. SYMPTOMS   Pain, stiffness, swelling, and decreased motion of many joints, especially in the hands and feet.  Stiffness that is worse in the morning. It may last 1 2 hours or longer.  Numbness and tingling in the hands.  Fatigue.  Loss of appetite.  Weight loss.  Low-grade fever.  Dry eyes and mouth.  Firm lumps (rheumatoid nodules) that grow beneath the skin in areas such as the elbows and hands. DIAGNOSIS  Diagnosis is based on the symptoms described, an exam, and blood tests. Sometimes, X-rays are helpful. TREATMENT  The goals of treatment are to relieve pain, reduce inflammation, and to slow down or stop joint damage and disability. Methods vary and may include:  Maintaining a balance of rest, exercise, and proper nutrition.  Medicines:  Pain relievers (analgesics).  Corticosteroids and nonsteroidal anti-inflammatory drugs (NSAIDs) to reduce inflammation.  Disease-modifying antirheumatic drugs (DMARDs) to try to slow the course of the disease.  Biologic response modifiers to reduce inflammation and damage.  Physical therapy and occupational therapy.  Surgery for patients with severe joint damage. Joint replacement or fusing of  joints may be needed.  Routine monitoring and ongoing care, such as office visits, blood and urine tests, and X-rays. HOME CARE INSTRUCTIONS   Remain physically active and reduce activity when the disease gets worse.  Eat a well-balanced diet.  Put heat on affected joints when you wake up and before activities. Keep the heat on the affected joint for as long as directed by your caregiver.  Put ice on affected joints following activities or exercising.  Put ice in a plastic bag.  Place a towel between your skin and the bag.  Leave the ice on for 15 to 20 minutes, 3 to 4 times a day.  Take all medicines and supplements as directed by your caregiver.  Use splints as directed by your caregiver. Splints help maintain joint position and function.  Do not sleep with pillows under your knees. This may lead to spasms.  Participate in a self-management program to keep current with the latest treatment and coping skills. SEEK IMMEDIATE MEDICAL CARE IF:  You have fainting episodes.  You have periods of extreme weakness.  You rapidly develop a hot, painful joint that is more severe than usual joint aches.  You have chills.  You have a fever. MAKE SURE YOU:  Understand these instructions.  Will watch your condition.  Will get help right away if you are not doing well or get worse. FOR MORE INFORMATION  American College of Rheumatology: www.rheumatology.org Arthritis Foundation: www.arthritis.org Document Released: 07/22/2000 Document Revised: 01/24/2012 Document Reviewed: 08/31/2011 [facility] Patient Information 2013 [place], [place].

## 2012-07-25 NOTE — Assessment & Plan Note (Signed)
Evaluated by Hepatologist Genia Harold, MD of [phone].   -will retrieve records

## 2012-07-25 NOTE — Assessment & Plan Note (Addendum)
Presents with new palmar erythema and nodule on 5th PIP joint. Evaluated by Dr. Denyse Dago Al-Khoudari ([facility]) 03/21/2012.  Referred to Hepatology before initiation of Plaquenil tx. -will schedule f/u with Dr. Gerrianne Scale for further management

## 2012-07-25 NOTE — Assessment & Plan Note (Signed)
Evaluated by Hepatologist Blake Scott, MD of [facility].   -will retrieve records 

## 2012-07-25 NOTE — Assessment & Plan Note (Signed)
Likely secondary to RA.  Pt does have liver disease but will defer further labs since she was evaluated by hepatology at [facility] in October. -f/u with Rheum

## 2012-07-25 NOTE — Progress Notes (Signed)
  Subjective:    Patient ID: Lorraine [name], female    DOB: 1961/02/07, 51 y.o.   MRN: [id number]  HPI  Presents to clinic with complaints of red palms and knot on her left pinky finger. Hx is significant for hepatitis B , chronic hepatitis C, rheumatoid arthritis with evaluation at [facility] in 04/2012; per notes retrieved from [facility] treatment with Plaquenil  was deferred until evaluated by  hepatologist in October.  Review of Systems  Constitutional: Negative for fever.  Respiratory: Negative for shortness of breath.   Musculoskeletal: Positive for arthralgias.       Knot on left pinky finger  Skin: Positive for color change.       Red palms  Neurological: Negative for dizziness and headaches.       Objective:   Physical Exam  Constitutional: She is oriented to person, place, and time. She appears well-developed and well-nourished. No distress.  HENT:  Head: Normocephalic and atraumatic.  Eyes: Conjunctivae normal and EOM are normal. Pupils are equal, round, and reactive to light.  Musculoskeletal:       Hands: Neurological: She is alert and oriented to person, place, and time.  Skin: Skin is warm and dry. There is erythema.       Bilateral palms mildly erythematous, no tender  Psychiatric: She has a normal mood and affect.          Assessment & Plan:  #1 rheumatoid arthritis: Evaluated by Dr. Rockne Coons, MD ([facility]) Not candidate for methotrexate given her history of HCV and HBV. Plaquenil therapy deferred until liver evaluation by hepatology. Pt initially couldn't remember if she was evaluated by hepatologist at [facility] which was indicated in Rheumatologist note. [facility] contacted and pt was evaluated by Hepatologist Dr. Genia Harold 05/23/2012. -will schedule f/u with rheumatology for consideration of Plaquenil therapy now that she has seen liver doc---> Jan 2014 scheduled

## 2012-08-28 ENCOUNTER — Ambulatory Visit (INDEPENDENT_AMBULATORY_CARE_PROVIDER_SITE_OTHER): Payer: No Typology Code available for payment source | Admitting: Internal Medicine

## 2012-08-28 ENCOUNTER — Encounter: Payer: Self-pay | Admitting: Internal Medicine

## 2012-08-28 VITALS — BP 131/84 | HR 70 | Temp 97.7°F | Ht 63.0 in | Wt 121.4 lb

## 2012-08-28 DIAGNOSIS — B373 Candidiasis of vulva and vagina: Secondary | ICD-10-CM

## 2012-08-28 DIAGNOSIS — I10 Essential (primary) hypertension: Secondary | ICD-10-CM

## 2012-08-28 DIAGNOSIS — Z79899 Other long term (current) drug therapy: Secondary | ICD-10-CM

## 2012-08-28 DIAGNOSIS — M069 Rheumatoid arthritis, unspecified: Secondary | ICD-10-CM

## 2012-08-28 DIAGNOSIS — N941 Unspecified dyspareunia: Secondary | ICD-10-CM

## 2012-08-28 DIAGNOSIS — E119 Type 2 diabetes mellitus without complications: Secondary | ICD-10-CM

## 2012-08-28 LAB — GLUCOSE, CAPILLARY

## 2012-08-28 MED ORDER — ESTRADIOL 0.1 MG/GM VA CREA: 2.0000 g | TOPICAL_CREAM | Freq: Every day | VAGINAL | Status: DC

## 2012-08-28 MED ORDER — AMLODIPINE BESYLATE 5 MG PO TABS: 5.0000 mg | ORAL_TABLET | Freq: Every day | ORAL | Status: DC

## 2012-08-28 MED ORDER — FLUCONAZOLE 100 MG PO TABS: 150.0000 mg | ORAL_TABLET | Freq: Every day | ORAL | Status: AC
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
## 2012-08-30 ENCOUNTER — Ambulatory Visit: Payer: Medicaid Other | Admitting: Internal Medicine

## 2012-08-30 ENCOUNTER — Encounter: Payer: Self-pay | Admitting: Internal Medicine

## 2012-08-30 ENCOUNTER — Other Ambulatory Visit (INDEPENDENT_AMBULATORY_CARE_PROVIDER_SITE_OTHER): Payer: Medicaid Other

## 2012-08-30 ENCOUNTER — Other Ambulatory Visit: Payer: Self-pay | Admitting: Internal Medicine

## 2012-08-30 ENCOUNTER — Ambulatory Visit (INDEPENDENT_AMBULATORY_CARE_PROVIDER_SITE_OTHER): Payer: Medicaid Other | Admitting: Internal Medicine

## 2012-08-30 ENCOUNTER — Ambulatory Visit (HOSPITAL_COMMUNITY)
Admission: RE | Admit: 2012-08-30 | Discharge: 2012-08-30 | Disposition: A | Discharge status: Home / Self Care | Payer: Medicaid Other | Source: Ambulatory Visit | Attending: Internal Medicine | Admitting: Internal Medicine

## 2012-08-30 VITALS — BP 146/87 | HR 60 | Temp 98.0°F | Ht 63.0 in | Wt 123.5 lb

## 2012-08-30 DIAGNOSIS — Z114 Encounter for screening for human immunodeficiency virus [HIV]: Secondary | ICD-10-CM

## 2012-08-30 DIAGNOSIS — R7611 Nonspecific reaction to tuberculin skin test without active tuberculosis: Secondary | ICD-10-CM

## 2012-08-30 HISTORY — DX: Nonspecific reaction to tuberculin skin test without active tuberculosis: R76.11

## 2012-08-30 LAB — HIV ANTIBODY (ROUTINE TESTING W REFLEX): HIV: NONREACTIVE
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
## 2012-08-30 NOTE — Assessment & Plan Note (Signed)
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 Her chest x-ray is negative. She has some nonspecific symptoms of tuberculosis infection. Also had sick contacts with her brother-in-law who had TB and died from it in 2001-12-18 per patient.   -will postpone immunosuppressive therapy for rheumatoid arthritis -Will postpone Zostavax vaccination -will get AFB, M. tuberculosis DNA probe, HIV ab tests.

## 2012-08-30 NOTE — Patient Instructions (Signed)
1. We need to do more test to find out whether you have active tuberculosis infection. Before we find out whether you have active tuberculosis, you cannot get Zoastavax vaccination.  2. Please take all medications as prescribed.  3. If you have worsening of your symptoms or new symptoms arise, please call the clinic ([phone]), or go to the ER immediately if symptoms are severe.

## 2012-09-03 ENCOUNTER — Other Ambulatory Visit: Payer: Self-pay | Admitting: *Deleted

## 2012-09-03 DIAGNOSIS — N941 Unspecified dyspareunia: Secondary | ICD-10-CM

## 2012-09-03 NOTE — Telephone Encounter (Signed)
Pharmacy request clarification of directions, amt to be used?

## 2012-09-04 NOTE — Telephone Encounter (Signed)
Fax from [facility] to clarify directions on Estradiol vag cream - "1/4 applicatorful = 1 gram" (used daily). Talked to Dr Burtis Junes, stated this is ok.  Pharmacy informed.

## 2012-09-05 ENCOUNTER — Encounter: Payer: Self-pay | Admitting: Internal Medicine

## 2012-09-06 LAB — M. TUBERCULOSIS COMPLEX BY PCR

## 2012-09-17 ENCOUNTER — Emergency Department (HOSPITAL_COMMUNITY)
Admission: EM | Admit: 2012-09-17 | Discharge: 2012-09-17 | Disposition: A | Discharge status: Home / Self Care | Payer: Medicaid Other | Attending: Emergency Medicine | Admitting: Emergency Medicine

## 2012-09-17 ENCOUNTER — Encounter (HOSPITAL_COMMUNITY): Payer: Self-pay | Admitting: *Deleted

## 2012-09-17 DIAGNOSIS — K089 Disorder of teeth and supporting structures, unspecified: Secondary | ICD-10-CM | POA: Insufficient documentation

## 2012-09-17 DIAGNOSIS — Z8739 Personal history of other diseases of the musculoskeletal system and connective tissue: Secondary | ICD-10-CM | POA: Insufficient documentation

## 2012-09-17 DIAGNOSIS — K0889 Other specified disorders of teeth and supporting structures: Secondary | ICD-10-CM

## 2012-09-17 DIAGNOSIS — Z8781 Personal history of (healed) traumatic fracture: Secondary | ICD-10-CM | POA: Insufficient documentation

## 2012-09-17 DIAGNOSIS — Z8742 Personal history of other diseases of the female genital tract: Secondary | ICD-10-CM | POA: Insufficient documentation

## 2012-09-17 DIAGNOSIS — E119 Type 2 diabetes mellitus without complications: Secondary | ICD-10-CM | POA: Insufficient documentation

## 2012-09-17 DIAGNOSIS — I10 Essential (primary) hypertension: Secondary | ICD-10-CM | POA: Insufficient documentation

## 2012-09-17 DIAGNOSIS — Z8719 Personal history of other diseases of the digestive system: Secondary | ICD-10-CM | POA: Insufficient documentation

## 2012-09-17 DIAGNOSIS — Z8619 Personal history of other infectious and parasitic diseases: Secondary | ICD-10-CM | POA: Insufficient documentation

## 2012-09-17 DIAGNOSIS — K029 Dental caries, unspecified: Secondary | ICD-10-CM | POA: Insufficient documentation

## 2012-09-17 DIAGNOSIS — Z79899 Other long term (current) drug therapy: Secondary | ICD-10-CM | POA: Insufficient documentation

## 2012-09-17 DIAGNOSIS — F17200 Nicotine dependence, unspecified, uncomplicated: Secondary | ICD-10-CM | POA: Insufficient documentation

## 2012-09-17 MED ORDER — CLINDAMYCIN HCL 300 MG PO CAPS
300.0000 mg | ORAL_CAPSULE | Freq: Once | ORAL | Status: AC
  Administered 2012-09-17: 300 mg via ORAL
  Filled 2012-09-17: qty 1

## 2012-09-17 MED ORDER — CLINDAMYCIN HCL 300 MG PO CAPS: 300.0000 mg | ORAL_CAPSULE | Freq: Four times a day (QID) | ORAL | Status: DC

## 2012-09-17 MED ORDER — OXYCODONE-ACETAMINOPHEN 5-325 MG PO TABS: 2.0000 | ORAL_TABLET | ORAL | Status: DC | PRN

## 2012-09-17 MED ORDER — OXYCODONE-ACETAMINOPHEN 5-325 MG PO TABS
2.0000 | ORAL_TABLET | Freq: Once | ORAL | Status: AC
  Administered 2012-09-17: 2 via ORAL
  Filled 2012-09-17: qty 1
  Filled 2012-09-17: qty 2

## 2012-09-17 NOTE — ED Notes (Signed)
Pt escorted to discharge window. Verbalized understanding discharge instructions. In no acute distress.   

## 2012-09-17 NOTE — ED Notes (Signed)
Pt having constant pain to upper teeth on right side; difficulty swallowing due to pain; swelling to right side of face; 10/10; unable to get in with dentist
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
## 2012-09-18 ENCOUNTER — Telehealth (HOSPITAL_COMMUNITY): Payer: Self-pay | Admitting: Emergency Medicine

## 2012-09-18 NOTE — ED Notes (Signed)
Call from on call dentist requesting demographics and referral be faxed to their office. 

## 2012-09-19 ENCOUNTER — Encounter: Payer: Self-pay | Admitting: Internal Medicine

## 2012-10-12 LAB — AFB CULTURE, BLOOD

## 2012-10-17 ENCOUNTER — Ambulatory Visit (INDEPENDENT_AMBULATORY_CARE_PROVIDER_SITE_OTHER): Payer: Medicaid Other | Admitting: Internal Medicine

## 2012-10-17 ENCOUNTER — Encounter: Payer: Self-pay | Admitting: Internal Medicine

## 2012-10-17 VITALS — BP 138/91 | HR 63 | Temp 97.2°F | Wt 120.3 lb

## 2012-10-17 DIAGNOSIS — E1140 Type 2 diabetes mellitus with diabetic neuropathy, unspecified: Secondary | ICD-10-CM

## 2012-10-17 DIAGNOSIS — E1149 Type 2 diabetes mellitus with other diabetic neurological complication: Secondary | ICD-10-CM

## 2012-10-17 DIAGNOSIS — I10 Essential (primary) hypertension: Secondary | ICD-10-CM

## 2012-10-17 DIAGNOSIS — N941 Unspecified dyspareunia: Secondary | ICD-10-CM

## 2012-10-17 DIAGNOSIS — E119 Type 2 diabetes mellitus without complications: Secondary | ICD-10-CM

## 2012-10-17 DIAGNOSIS — E781 Pure hyperglyceridemia: Secondary | ICD-10-CM

## 2012-10-17 DIAGNOSIS — E1142 Type 2 diabetes mellitus with diabetic polyneuropathy: Secondary | ICD-10-CM

## 2012-10-17 DIAGNOSIS — Z79899 Other long term (current) drug therapy: Secondary | ICD-10-CM

## 2012-10-17 DIAGNOSIS — M069 Rheumatoid arthritis, unspecified: Secondary | ICD-10-CM

## 2012-10-17 LAB — LIPID PANEL
HDL: 79 mg/dL (ref >39)
LDL Cholesterol: 81 mg/dL (ref 0–99)
Total CHOL/HDL Ratio: 2.2 Ratio
Triglycerides: 74 mg/dL (ref <150)
VLDL: 15 mg/dL (ref 0–40)

## 2012-10-17 LAB — POCT GLYCOSYLATED HEMOGLOBIN (HGB A1C): Hemoglobin A1C: 5.7

## 2012-10-17 LAB — GLUCOSE, CAPILLARY: Glucose-Capillary: 103 mg/dL — ABNORMAL HIGH (ref 70–99)

## 2012-10-17 MED ORDER — AMLODIPINE BESYLATE 5 MG PO TABS: 5.0000 mg | ORAL_TABLET | Freq: Every day | ORAL | Status: DC

## 2012-10-17 MED ORDER — QUINAPRIL-HYDROCHLOROTHIAZIDE 10-12.5 MG PO TABS: 2.0000 | ORAL_TABLET | Freq: Every day | ORAL | Status: DC

## 2012-10-17 MED ORDER — METFORMIN HCL 1000 MG PO TABS: 1000.0000 mg | ORAL_TABLET | Freq: Every day | ORAL | Status: DC

## 2012-10-17 MED ORDER — PRAVASTATIN SODIUM 40 MG PO TABS: 40.0000 mg | ORAL_TABLET | Freq: Every evening | ORAL | Status: DC

## 2012-10-17 MED ORDER — AMITRIPTYLINE HCL 100 MG PO TABS: 100.0000 mg | ORAL_TABLET | Freq: Every evening | ORAL | Status: DC | PRN

## 2012-10-17 MED ORDER — GLUCOSE BLOOD VI STRP: ORAL_STRIP | Status: DC

## 2012-10-17 MED ORDER — ESTRADIOL 0.1 MG/GM VA CREA: 2.0000 g | TOPICAL_CREAM | Freq: Every day | VAGINAL | Status: DC

## 2012-10-17 MED ORDER — MELOXICAM 7.5 MG PO TABS: 7.5000 mg | ORAL_TABLET | Freq: Every day | ORAL | Status: DC | PRN
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
## 2012-10-17 NOTE — Patient Instructions (Addendum)
General Instructions: -Stop taking metformin, but continue to check your blood sugar daily.  -You can stop taking potassium pills  Please be sure to bring all of your medications with you to every visit.  Should you have any new or worsening symptoms, please be sure to call the clinic at [phone].  Treatment Goals:  Goals (1 Years of Data) as of 10/17/12         As of Today 09/17/12 09/17/12 08/30/12 08/28/12     Blood Pressure    . Blood Pressure < 140/80  138/91 137/81 183/100 146/87 131/84     Lifestyle    . Quit smoking / using tobacco  No         Result Component    . HEMOGLOBIN A1C < 7.0  5.7          Progress Toward Treatment Goals:  Treatment Goal 10/17/2012  Blood pressure unchanged  Stop smoking smoking less    Self Care Goals & Plans:  Self Care Goal 10/17/2012  Manage my medications take my medicines as prescribed; bring my medications to every visit  Monitor my health bring my glucose meter and log to each visit; keep track of my blood glucose  Eat healthy foods eat foods that are low in salt  Be physically active take a walk every day  Stop smoking go to the [employer] website ([email or web address])    Home Blood Glucose Monitoring 10/17/2012  Check my blood sugar once a day  When to check my blood sugar before breakfast

## 2012-10-19 NOTE — Assessment & Plan Note (Signed)
Lab Results  Component Value Date   HGBA1C 5.7 10/17/2012   HGBA1C 6.0 07/11/2012   HGBA1C 5.5 04/03/2012     Assessment:  Diabetes control: good control (HgbA1C at goal)  Progress toward A1C goal:   improved  Plan:  Medications:  Given that she fills ill with metformin, and she has made so many lifestyle modifications resulting in a low A1c, there is the possibility that she become hypoglycemic.  She has not checked her blood sugars when she feels ill.  I will have her discontinue metformin, monitor symptoms and recheck A1c in 3 months.  Home glucose monitoring:   Frequency: once a day   Timing: before breakfast  Instruction/counseling given: reminded to bring blood glucose meter & log to each visit and reminded to bring medications to each visit  Educational resources provided: brochure  Self management tools provided: copy of home glucose meter download;home glucose logbook

## 2012-10-19 NOTE — Assessment & Plan Note (Signed)
BP Readings from Last 3 Encounters:  10/17/12 138/91  09/17/12 137/81  08/30/12 146/87    Lab Results  Component Value Date   NA 138 07/11/2012   K 3.9 07/11/2012   CREATININE 0.56 07/11/2012    Assessment:  Blood pressure control: mildly elevated  Progress toward BP goal:  unchanged  Plan:  Medications:  continue current medications - amlodipine 5mg , Accuretic 20-25; if remains elevated at next visit, may increase amlodipine  Educational resources provided: brochure;video  Self management tools provided: home blood pressure logbook  Check CMET at follow up (monitor K), discontinued K supp at this visit.

## 2012-10-29 ENCOUNTER — Other Ambulatory Visit: Payer: Self-pay | Admitting: Internal Medicine

## 2012-10-29 DIAGNOSIS — D242 Benign neoplasm of left breast: Secondary | ICD-10-CM

## 2012-11-02 ENCOUNTER — Other Ambulatory Visit: Payer: Medicaid Other

## 2012-11-05 ENCOUNTER — Telehealth: Payer: Self-pay | Admitting: *Deleted

## 2012-11-05 NOTE — Telephone Encounter (Signed)
Pt calls and ask when her md was going to call her, she states the md was to call the md in [place], she would like to know when to start her enbrel, please call her at [phone], please advise or call Thank you

## 2012-11-07 ENCOUNTER — Other Ambulatory Visit: Payer: Self-pay | Admitting: Internal Medicine

## 2012-11-07 ENCOUNTER — Ambulatory Visit
Admission: RE | Admit: 2012-11-07 | Discharge: 2012-11-07 | Disposition: A | Discharge status: Home / Self Care | Payer: Medicaid Other | Source: Ambulatory Visit | Attending: Family Medicine | Admitting: Family Medicine

## 2012-11-07 ENCOUNTER — Encounter: Payer: Self-pay | Admitting: Internal Medicine

## 2012-11-07 DIAGNOSIS — D242 Benign neoplasm of left breast: Secondary | ICD-10-CM

## 2012-11-07 NOTE — Progress Notes (Signed)
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 Tuberculosis DNA probe was negative and HIV was negative.  Patient reports she will not get zostavax. She will need 9 months of INH therapy prior to starting enbrel.

## 2012-11-07 NOTE — Telephone Encounter (Signed)
Reviewed records from [facility], and it seems that she got the Enbrel injections in January.  Apparently she was supposed to have a PPD done prior to starting the shots for RA (she has not started shots yet), which she tells me she had at [facility], we need to locate records of PPD (I couldn't find it on my brief search), otherwise she needs to have PPD placed and read. She also wants to get her shingles vaccine prior to starting medication.  I asked that she let us know after she has vaccine. Thanks!

## 2012-11-09 ENCOUNTER — Other Ambulatory Visit: Payer: Self-pay | Admitting: Internal Medicine

## 2012-11-09 DIAGNOSIS — Z227 Latent tuberculosis: Secondary | ICD-10-CM

## 2012-11-09 DIAGNOSIS — R7611 Nonspecific reaction to tuberculin skin test without active tuberculosis: Secondary | ICD-10-CM

## 2012-11-14 NOTE — Progress Notes (Signed)
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 Evorn Gong note, but CXR and subsequent blood work & CXR do not suggest active disease. Patient is at increased risk for latent TB given exposure to brother in law and she is at risk for re-activation if she starts enbrel. For this reason, we will start Isoniazid 300mg  daily for latent tb tx (5 mg/kg = 272mg , discussed with clinical pharmacist, and since patient not in acute hep flare, we agree that dosing at 300mg  daily would be better than underdosing at 200mg  daily.  Before starting therapy, we will check a CMET (patient agrees to labs on 11/15/12) to monitor liver enzymes given h/o hepatitis and EtOH use.  We will then need repeat LFTs and office visit in 1 month (and monthly subsequently) after initiation.  Given her h/o EtOH use, DM and neuropathy, will also treat with pyridoxine 50mg  daily.  Before starting medication, will educate patient regarding sx of hepatitis including anorexia, nausea, vomiting, dark urine, icterus, rash, persistent paresthesias of the hands and feet, persistent fatigue, weakness or fever lasting three or more days, abdominal pain (particularly right upper quadrant discomfort), easy bruising or bleeding, or arthralgias.    -Baseline CMET on 11/15/12 to monitor LFTs -Plan to treat with Isoniazid 300mg  daily and pyridoxine 50mg  daily for 9 months before starting enbrel for RA (will send to pharmacy after labs resulted) -Monitor LFTs monthly given history noted above. -Discussed plan with pt on 11/14/12 at noon

## 2012-11-15 ENCOUNTER — Other Ambulatory Visit (INDEPENDENT_AMBULATORY_CARE_PROVIDER_SITE_OTHER): Payer: Medicaid Other

## 2012-11-15 DIAGNOSIS — Z227 Latent tuberculosis: Secondary | ICD-10-CM

## 2012-11-15 DIAGNOSIS — A150 Tuberculosis of lung: Secondary | ICD-10-CM

## 2012-11-15 LAB — COMPREHENSIVE METABOLIC PANEL
ALT: 35 U/L (ref 0–35)
AST: 229 U/L — ABNORMAL HIGH (ref 0–37)
Alkaline Phosphatase: 70 U/L (ref 39–117)
Creat: 0.75 mg/dL (ref 0.50–1.10)
Total Bilirubin: 0.5 mg/dL (ref 0.3–1.2)

## 2012-11-16 ENCOUNTER — Other Ambulatory Visit: Payer: Self-pay | Admitting: Internal Medicine

## 2012-11-16 DIAGNOSIS — Z227 Latent tuberculosis: Secondary | ICD-10-CM

## 2012-11-16 DIAGNOSIS — R7611 Nonspecific reaction to tuberculin skin test without active tuberculosis: Secondary | ICD-10-CM

## 2012-11-16 MED ORDER — PYRIDOXINE HCL 50 MG PO TABS: 50.0000 mg | ORAL_TABLET | Freq: Every day | ORAL | Status: DC

## 2012-11-16 MED ORDER — ISONIAZID 300 MG PO TABS: 300.0000 mg | ORAL_TABLET | Freq: Every day | ORAL | Status: DC

## 2012-11-16 NOTE — Progress Notes (Signed)
Reviewed labs, AST = 220, patient has baseline elevated AST and has been drinking EtOH.  Called and counseled patient regarding EtOH cessation, esp over the next 9 months, and initiation of INH + pyridoxine.  We will also d/c pravastatin given her risk for hepatitis at this time.  I counseled her on the signs and symptoms of hepatitis explained in my last documentation.  Patient expressed understanding. She will return 2 weeks after starting therapy for hepatic panel, and return in 1 month for clinic f/u & repeat hepatic panel.

## 2012-11-20 ENCOUNTER — Telehealth: Payer: Self-pay | Admitting: *Deleted

## 2012-11-20 NOTE — Telephone Encounter (Signed)
Pt called today and stated her meds had not been called in, i called the pharmacy and the pharmacist states that yes, they have the scripts but pt's insurance will not pay for them, it refuses. i spoke w/ 4 pharmacies and negotiated pricing, after speaking with [facility] [employer] pharm and [facility], there is a $3.00 difference in [facility] being the cheapest ( this is giving [place] a discount for calling them) [facility] then gave me a discount and brought price down from 27.00 to 13.00, due to transportation pt will go with [facility] at 13.00. She will pick up tomorrow and start then

## 2012-12-06 ENCOUNTER — Other Ambulatory Visit (INDEPENDENT_AMBULATORY_CARE_PROVIDER_SITE_OTHER): Payer: Medicaid Other

## 2012-12-06 DIAGNOSIS — A150 Tuberculosis of lung: Secondary | ICD-10-CM

## 2012-12-06 DIAGNOSIS — R7611 Nonspecific reaction to tuberculin skin test without active tuberculosis: Secondary | ICD-10-CM

## 2012-12-06 DIAGNOSIS — Z227 Latent tuberculosis: Secondary | ICD-10-CM

## 2012-12-06 LAB — HEPATIC FUNCTION PANEL
ALT: 38 U/L — ABNORMAL HIGH (ref 0–35)
AST: 218 U/L — ABNORMAL HIGH (ref 0–37)
Albumin: 4.1 g/dL (ref 3.5–5.2)
Alkaline Phosphatase: 68 U/L (ref 39–117)
Bilirubin, Direct: 0.1 mg/dL (ref 0.0–0.3)
Indirect Bilirubin: 0.2 mg/dL (ref 0.0–0.9)
Total Bilirubin: 0.3 mg/dL (ref 0.3–1.2)
Total Protein: 7.8 g/dL (ref 6.0–8.3)

## 2012-12-07 ENCOUNTER — Other Ambulatory Visit: Payer: Medicaid Other

## 2013-01-02 ENCOUNTER — Ambulatory Visit (INDEPENDENT_AMBULATORY_CARE_PROVIDER_SITE_OTHER): Payer: Medicaid Other | Admitting: Internal Medicine

## 2013-01-02 ENCOUNTER — Encounter: Payer: Self-pay | Admitting: Internal Medicine

## 2013-01-02 VITALS — BP 107/78 | HR 84 | Temp 97.0°F | Ht 63.0 in | Wt 122.3 lb

## 2013-01-02 DIAGNOSIS — R7611 Nonspecific reaction to tuberculin skin test without active tuberculosis: Secondary | ICD-10-CM

## 2013-01-02 DIAGNOSIS — E1149 Type 2 diabetes mellitus with other diabetic neurological complication: Secondary | ICD-10-CM

## 2013-01-02 DIAGNOSIS — M069 Rheumatoid arthritis, unspecified: Secondary | ICD-10-CM

## 2013-01-02 DIAGNOSIS — I10 Essential (primary) hypertension: Secondary | ICD-10-CM

## 2013-01-02 DIAGNOSIS — E1140 Type 2 diabetes mellitus with diabetic neuropathy, unspecified: Secondary | ICD-10-CM

## 2013-01-02 DIAGNOSIS — F17200 Nicotine dependence, unspecified, uncomplicated: Secondary | ICD-10-CM

## 2013-01-02 DIAGNOSIS — R42 Dizziness and giddiness: Secondary | ICD-10-CM

## 2013-01-02 DIAGNOSIS — E119 Type 2 diabetes mellitus without complications: Secondary | ICD-10-CM

## 2013-01-02 DIAGNOSIS — E1142 Type 2 diabetes mellitus with diabetic polyneuropathy: Secondary | ICD-10-CM

## 2013-01-02 DIAGNOSIS — M2140 Flat foot [pes planus] (acquired), unspecified foot: Secondary | ICD-10-CM

## 2013-01-02 LAB — COMPREHENSIVE METABOLIC PANEL
Alkaline Phosphatase: 64 U/L (ref 39–117)
BUN: 14 mg/dL (ref 6–23)
Glucose, Bld: 105 mg/dL — ABNORMAL HIGH (ref 70–99)
Sodium: 137 mEq/L (ref 135–145)
Total Bilirubin: 0.2 mg/dL — ABNORMAL LOW (ref 0.3–1.2)
Total Protein: 8.7 g/dL — ABNORMAL HIGH (ref 6.0–8.3)

## 2013-01-02 LAB — CBC
Hemoglobin: 15.4 g/dL — ABNORMAL HIGH (ref 12.0–15.0)
MCH: 31.4 pg (ref 26.0–34.0)
MCHC: 35.2 g/dL (ref 30.0–36.0)

## 2013-01-02 MED ORDER — GLUCOSE BLOOD VI STRP: ORAL_STRIP | Status: DC

## 2013-01-02 MED ORDER — QUINAPRIL-HYDROCHLOROTHIAZIDE 10-12.5 MG PO TABS: 1.0000 | ORAL_TABLET | Freq: Every day | ORAL | Status: DC

## 2013-01-02 NOTE — Assessment & Plan Note (Signed)
BP Readings from Last 3 Encounters:  01/02/13 107/78  10/17/12 138/91  09/17/12 137/81    Lab Results  Component Value Date   NA 137 01/02/2013   K 3.2* 01/02/2013   CREATININE 0.73 01/02/2013    Assessment: Blood pressure control: controlled Progress toward BP goal:  at goal  Plan: Medications: Cont amlodipine 5, decrease accuretic to 10-12.5 (was taking 2 tabs), recheck electrolytes in one month, most recent panel revealed hypokalemia Educational resources provided: brochure;video Self management tools provided: home blood pressure logbook

## 2013-01-02 NOTE — Assessment & Plan Note (Signed)
Lab Results  Component Value Date   HGBA1C 5.8 01/02/2013   HGBA1C 5.7 10/17/2012   HGBA1C 6.0 07/11/2012     Assessment: Diabetes control: good control (HgbA1C at goal)   Plan: Medications:  diet controlled Home glucose monitoring: Frequency: no home glucose monitoring Timing:  as needed Instruction/counseling given: reminded to get eye exam, reminded to bring medications to each visit and discussed diet Educational resources provided: brochure Self management tools provided: copy of home glucose meter download

## 2013-01-02 NOTE — Assessment & Plan Note (Signed)
Suggest pain mgmt with ibuprofen and rolling tennis ball under feet.

## 2013-01-02 NOTE — Patient Instructions (Signed)
General Instructions: -Continue taking your isoniazid and B vitamin.  -Your blood pressure and diabetes are under excellent control  -Use ibuprofen 800mg  three times a day for your joint pain.  If this treatment works, and you need a refill, call us.  If this treatment does not work, call us.  -Be sure to have your eye exam done  Please be sure to bring all of your medications with you to every visit.  Should you have any new or worsening symptoms, please be sure to call the clinic at [phone].   Treatment Goals:  Goals (1 Years of Data) as of 01/02/13         As of Today As of Today As of Today As of Today 10/17/12     Blood Pressure    . Blood Pressure < 140/80  107/78 118/83 121/77 125/83 138/91     Lifestyle    . Quit smoking / using tobacco      No     Result Component    . HEMOGLOBIN A1C < 7.0  5.8    5.7      Progress Toward Treatment Goals:  Treatment Goal 01/02/2013  Blood pressure at goal  Stop smoking smoking less    Self Care Goals & Plans:  Self Care Goal 01/02/2013  Manage my medications take my medicines as prescribed; refill my medications on time; bring my medications to every visit  Monitor my health keep track of my blood glucose; bring my glucose meter and log to each visit  Eat healthy foods eat foods that are low in salt; eat baked foods instead of fried foods  Be physically active find an activity I enjoy  Stop smoking go to the [employer] ([email or web address])    Home Blood Glucose Monitoring 01/02/2013  Check my blood sugar no home glucose monitoring  When to check my blood sugar -     Care Management & Community Referrals:  Referral 01/02/2013  Referrals made for care management support none needed
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
## 2013-01-02 NOTE — Assessment & Plan Note (Addendum)
Patient has chronic transaminitis and history of EtOH use.  One month ago, she was started INH therapy for latent TB and we have been closely monitoring her liver enzymes for this reason.  Her liver enzymes have risen since her last blood draw, likely d/t INH.  AST>>ALT, and she confirms that she has stopped drinking EtOH since starting therapy.    -Stop INH therapy, recheck CMET in 1 month; if liver enzymes return to baseline, then start LTB therapy with Rifampin for 4 months -check CK to confirm LFT elevation is from liver, not muscle

## 2013-01-02 NOTE — Assessment & Plan Note (Signed)
Patient was going to start etanercept per [facility] rheum; unfortunately, her PPD was positive.  Her LFTs have risen on INH. Her joint pain is still manageable, but we may need to treat LTB with rifampin as long as her LFTs return to baseline.  If her pain is not managed with NSAIDs, we may need to consider opiate mgmt for the short term until she is cleared for etanercept therapy.

## 2013-01-03 LAB — CK: Total CK: 141 U/L (ref 7–177)

## 2013-01-17 ENCOUNTER — Telehealth: Payer: Self-pay | Admitting: Licensed Clinical Social Worker

## 2013-01-17 NOTE — Telephone Encounter (Addendum)
Ms. Swarthout placed call to CSW today to request referral for Handicap Placard.  Pt states she is having increased difficulty with foot/hand numbness when walking long distances through parking lots.  CSW informed Ms. Bashor, will need to forward request to PCP to determine medical appropriateness.  If PCP in agreement, CSW will initiate paperwork and notify pt.  Pt voiced understanding.

## 2013-01-23 NOTE — Telephone Encounter (Signed)
Per request of PCP, form initiated and placed in PCP box.

## 2013-02-01 ENCOUNTER — Other Ambulatory Visit (INDEPENDENT_AMBULATORY_CARE_PROVIDER_SITE_OTHER): Payer: Medicaid Other

## 2013-02-01 DIAGNOSIS — Z79899 Other long term (current) drug therapy: Secondary | ICD-10-CM

## 2013-02-01 DIAGNOSIS — R7611 Nonspecific reaction to tuberculin skin test without active tuberculosis: Secondary | ICD-10-CM

## 2013-02-01 LAB — COMPREHENSIVE METABOLIC PANEL
ALT: 31 U/L (ref 0–35)
Albumin: 3.3 g/dL — ABNORMAL LOW (ref 3.5–5.2)
CO2: 29 mEq/L (ref 19–32)
Calcium: 9.2 mg/dL (ref 8.4–10.5)
Chloride: 102 mEq/L (ref 96–112)
Sodium: 139 mEq/L (ref 135–145)
Total Protein: 7.6 g/dL (ref 6.0–8.3)

## 2013-02-01 LAB — CK TOTAL AND CKMB (NOT AT ARMC): Total CK: 152 U/L (ref 7–177)

## 2013-02-06 ENCOUNTER — Encounter: Payer: Medicaid Other | Admitting: Internal Medicine

## 2013-02-06 ENCOUNTER — Telehealth: Payer: Self-pay | Admitting: Internal Medicine

## 2013-02-06 DIAGNOSIS — Z227 Latent tuberculosis: Secondary | ICD-10-CM

## 2013-02-06 DIAGNOSIS — M069 Rheumatoid arthritis, unspecified: Secondary | ICD-10-CM

## 2013-02-06 MED ORDER — RIFAMPIN 300 MG PO CAPS: 600.0000 mg | ORAL_CAPSULE | Freq: Every day | ORAL | Status: AC

## 2013-02-06 MED ORDER — NAPROXEN 500 MG PO TABS: 500.0000 mg | ORAL_TABLET | Freq: Two times a day (BID) | ORAL | Status: DC

## 2013-02-06 NOTE — Telephone Encounter (Signed)
Patient's liver enzymes & BR have returned to baseline (though AST still 200, she always hovers around here, and AST on INH was 346).    I will initiate latent TB therapy with rifampin 600mg /day (weight based) with plans to treat for 4 months.  She will return to clinic 2 weeks after starting therapy for CBC and CMET, and every 2-4 weeks thereafter.  She will need a CXR in 2-3 months after starting therapy per monitoring recommendations on UpToDate.    I discussed this therapy with her, and warned her of side effects that include, but are not limited to, anorexia, diarrhea, abdominal pain, behavioral changes, rash, myalgias.  She also told me her joints have started to hurt and swell again (hands and feet).  She has not been taking naproxen, and intermittently takes ibuprofen.  I am sending in naproxen 500mg  BID, and if she does not have relief in 1 week, she will call to let me know, and I will initiate vicodin therapy until she can start her RA therapy at [facility].

## 2013-02-14 ENCOUNTER — Other Ambulatory Visit: Payer: Self-pay

## 2013-03-01 ENCOUNTER — Other Ambulatory Visit (INDEPENDENT_AMBULATORY_CARE_PROVIDER_SITE_OTHER): Payer: Medicaid Other

## 2013-03-01 ENCOUNTER — Encounter: Payer: Self-pay | Admitting: Internal Medicine

## 2013-03-01 ENCOUNTER — Ambulatory Visit (INDEPENDENT_AMBULATORY_CARE_PROVIDER_SITE_OTHER): Payer: Medicaid Other | Admitting: Internal Medicine

## 2013-03-01 VITALS — BP 158/93 | HR 64 | Temp 97.8°F | Ht 63.0 in | Wt 123.5 lb

## 2013-03-01 DIAGNOSIS — F17200 Nicotine dependence, unspecified, uncomplicated: Secondary | ICD-10-CM

## 2013-03-01 DIAGNOSIS — R7611 Nonspecific reaction to tuberculin skin test without active tuberculosis: Secondary | ICD-10-CM

## 2013-03-01 DIAGNOSIS — I10 Essential (primary) hypertension: Secondary | ICD-10-CM

## 2013-03-01 DIAGNOSIS — R519 Headache, unspecified: Secondary | ICD-10-CM | POA: Insufficient documentation

## 2013-03-01 DIAGNOSIS — A150 Tuberculosis of lung: Secondary | ICD-10-CM

## 2013-03-01 DIAGNOSIS — Z227 Latent tuberculosis: Secondary | ICD-10-CM

## 2013-03-01 DIAGNOSIS — R51 Headache: Secondary | ICD-10-CM

## 2013-03-01 LAB — CBC
HCT: 41.4 % (ref 36.0–46.0)
Hemoglobin: 14.6 g/dL (ref 12.0–15.0)
RDW: 13.9 % (ref 11.5–15.5)
WBC: 6.5 10*3/uL (ref 4.0–10.5)

## 2013-03-01 LAB — COMPREHENSIVE METABOLIC PANEL
Albumin: 3.8 g/dL (ref 3.5–5.2)
BUN: 8 mg/dL (ref 6–23)
CO2: 29 mEq/L (ref 19–32)
Calcium: 9.8 mg/dL (ref 8.4–10.5)
Chloride: 98 mEq/L (ref 96–112)
Creat: 0.69 mg/dL (ref 0.50–1.10)
Potassium: 3.6 mEq/L (ref 3.5–5.3)

## 2013-03-01 MED ORDER — ACETAMINOPHEN 325 MG PO TABS
500.0000 mg | ORAL_TABLET | Freq: Once | ORAL | Status: AC
  Administered 2013-03-01: 487.5 mg via ORAL

## 2013-03-01 MED ORDER — AMLODIPINE BESYLATE 5 MG PO TABS: 10.0000 mg | ORAL_TABLET | Freq: Every day | ORAL | Status: DC

## 2013-03-01 NOTE — Patient Instructions (Addendum)
General Instructions: Log you blood pressure  Follow up in 2 weeks  Stop smoking  Increased Norvasc 5 mg to 10 mg daily  Keep exercising   Treatment Goals:  Goals (1 Years of Data) as of 03/01/13         As of Today As of Today As of Today As of Today 01/02/13     Blood Pressure    . Blood Pressure < 140/80  158/93 161/89 157/88 170/89 107/78     Lifestyle    . Quit smoking / using tobacco           Result Component    . HEMOGLOBIN A1C < 7.0      5.8      Progress Toward Treatment Goals:  Treatment Goal 03/01/2013  Hemoglobin A1C at goal  Blood pressure unable to assess  Stop smoking smoking less    Self Care Goals & Plans:  Self Care Goal 03/01/2013  Manage my medications bring my medications to every visit; refill my medications on time; take my medicines as prescribed  Monitor my health keep track of my blood pressure  Eat healthy foods drink diet soda or water instead of juice or soda; eat more vegetables; eat foods that are low in salt; eat baked foods instead of fried foods; eat fruit for snacks and desserts; eat smaller portions  Be physically active find an activity I enjoy  Stop smoking -  Meeting treatment goals maintain the current self-care plan    Home Blood Glucose Monitoring 03/01/2013  Check my blood sugar -  When to check my blood sugar N/A     Care Management & Community Referrals:  Referral 03/01/2013  Referrals made for care management support none needed  Referrals made to community resources none        Smoking Cessation Quitting smoking is important to your health and has many advantages. However, it is not always easy to quit since nicotine is a very addictive drug. Often times, people try 3 times or more before being able to quit. This document explains the best ways for you to prepare to quit smoking. Quitting takes hard work and a lot of effort, but you can do it. ADVANTAGES OF QUITTING SMOKING  You will live longer, feel better, and  live better.  Your body will feel the impact of quitting smoking almost immediately.  Within 20 minutes, blood pressure decreases. Your pulse returns to its normal level.  After 8 hours, carbon monoxide levels in the blood return to normal. Your oxygen level increases.  After 24 hours, the chance of having a heart attack starts to decrease. Your breath, hair, and body stop smelling like smoke.  After 48 hours, damaged nerve endings begin to recover. Your sense of taste and smell improve.  After 72 hours, the body is virtually free of nicotine. Your bronchial tubes relax and breathing becomes easier.  After 2 to 12 weeks, lungs can hold more air. Exercise becomes easier and circulation improves.  The risk of having a heart attack, stroke, cancer, or lung disease is greatly reduced.  After 1 year, the risk of coronary heart disease is cut in half.  After 5 years, the risk of stroke falls to the same as a nonsmoker.  After 10 years, the risk of lung cancer is cut in half and the risk of other cancers decreases significantly.  After 15 years, the risk of coronary heart disease drops, usually to the level of a nonsmoker.  If  you are pregnant, quitting smoking will improve your chances of having a healthy baby.  The people you live with, especially any children, will be healthier.  You will have extra money to spend on things other than cigarettes. QUESTIONS TO THINK ABOUT BEFORE ATTEMPTING TO QUIT You may want to talk about your answers with your caregiver.  Why do you want to quit?  If you tried to quit in the past, what helped and what did not?  What will be the most difficult situations for you after you quit? How will you plan to handle them?  Who can help you through the tough times? Your family? Friends? A caregiver?  What pleasures do you get from smoking? What ways can you still get pleasure if you quit? Here are some questions to ask your caregiver:  How can you help  me to be successful at quitting?  What medicine do you think would be best for me and how should I take it?  What should I do if I need more help?  What is smoking withdrawal like? How can I get information on withdrawal? GET READY  Set a quit date.  Change your environment by getting rid of all cigarettes, ashtrays, matches, and lighters in your home, car, or work. Do not let people smoke in your home.  Review your past attempts to quit. Think about what worked and what did not. GET SUPPORT AND ENCOURAGEMENT You have a better chance of being successful if you have help. You can get support in many ways.  Tell your family, friends, and co-workers that you are going to quit and need their support. Ask them not to smoke around you.  Get individual, group, or telephone counseling and support. Programs are available at [employer] and health centers. Call your local health department for information about programs in your area.  Spiritual beliefs and practices may help some smokers quit.  Download a "quit meter" on your computer to keep track of quit statistics, such as how long you have gone without smoking, cigarettes not smoked, and money saved.  Get a self-help book about quitting smoking and staying off of tobacco. LEARN NEW SKILLS AND BEHAVIORS  Distract yourself from urges to smoke. Talk to someone, go for a walk, or occupy your time with a task.  Change your normal routine. Take a different route to work. Drink tea instead of coffee. Eat breakfast in a different place.  Reduce your stress. Take a hot bath, exercise, or read a book.  Plan something enjoyable to do every day. Reward yourself for not smoking.  Explore interactive web-based programs that specialize in helping you quit. GET MEDICINE AND USE IT CORRECTLY Medicines can help you stop smoking and decrease the urge to smoke. Combining medicine with the above behavioral methods and support can greatly increase your  chances of successfully quitting smoking.  Nicotine replacement therapy helps deliver nicotine to your body without the negative effects and risks of smoking. Nicotine replacement therapy includes nicotine gum, lozenges, inhalers, nasal sprays, and skin patches. Some may be available over-the-counter and others require a prescription.  Antidepressant medicine helps people abstain from smoking, but how this works is unknown. This medicine is available by prescription.  Nicotinic receptor partial agonist medicine simulates the effect of nicotine in your brain. This medicine is available by prescription. Ask your caregiver for advice about which medicines to use and how to use them based on your health history. Your caregiver will tell you what  side effects to look out for if you choose to be on a medicine or therapy. Carefully read the information on the package. Do not use any other product containing nicotine while using a nicotine replacement product.  RELAPSE OR DIFFICULT SITUATIONS Most relapses occur within the first 3 months after quitting. Do not be discouraged if you start smoking again. Remember, most people try several times before finally quitting. You may have symptoms of withdrawal because your body is used to nicotine. You may crave cigarettes, be irritable, feel very hungry, cough often, get headaches, or have difficulty concentrating. The withdrawal symptoms are only temporary. They are strongest when you first quit, but they will go away within 10 14 days. To reduce the chances of relapse, try to:  Avoid drinking alcohol. Drinking lowers your chances of successfully quitting.  Reduce the amount of caffeine you consume. Once you quit smoking, the amount of caffeine in your body increases and can give you symptoms, such as a rapid heartbeat, sweating, and anxiety.  Avoid smokers because they can make you want to smoke.  Do not let weight gain distract you. Many smokers will gain  weight when they quit, usually less than 10 pounds. Eat a healthy diet and stay active. You can always lose the weight gained after you quit.  Find ways to improve your mood other than smoking. FOR MORE INFORMATION  www.smokefree.gov  Document Released: 07/19/2001 Document Revised: 01/24/2012 Document Reviewed: 11/03/2011 [facility] Patient Information 2014 [place], [place].  Hypertension As your heart beats, it forces blood through your arteries. This force is your blood pressure. If the pressure is too high, it is called hypertension (HTN) or high blood pressure. HTN is dangerous because you may have it and not know it. High blood pressure may mean that your heart has to work harder to pump blood. Your arteries may be narrow or stiff. The extra work puts you at risk for heart disease, stroke, and other problems.  Blood pressure consists of two numbers, a higher number over a lower, 110/72, for example. It is stated as "110 over 72." The ideal is below 120 for the top number (systolic) and under 80 for the bottom (diastolic). Write down your blood pressure today. You should pay close attention to your blood pressure if you have certain conditions such as:  Heart failure.  Prior heart attack.  Diabetes  Chronic kidney disease.  Prior stroke.  Multiple risk factors for heart disease. To see if you have HTN, your blood pressure should be measured while you are seated with your arm held at the level of the heart. It should be measured at least twice. A one-time elevated blood pressure reading (especially in the Emergency Department) does not mean that you need treatment. There may be conditions in which the blood pressure is different between your right and left arms. It is important to see your caregiver soon for a recheck. Most people have essential hypertension which means that there is not a specific cause. This type of high blood pressure may be lowered by changing lifestyle factors such  as:  Stress.  Smoking.  Lack of exercise.  Excessive weight.  Drug/tobacco/alcohol use.  Eating less salt. Most people do not have symptoms from high blood pressure until it has caused damage to the body. Effective treatment can often prevent, delay or reduce that damage. TREATMENT  When a cause has been identified, treatment for high blood pressure is directed at the cause. There are a large number of  medications to treat HTN. These fall into several categories, and your caregiver will help you select the medicines that are best for you. Medications may have side effects. You should review side effects with your caregiver. If your blood pressure stays high after you have made lifestyle changes or started on medicines,   Your medication(s) may need to be changed.  Other problems may need to be addressed.  Be certain you understand your prescriptions, and know how and when to take your medicine.  Be sure to follow up with your caregiver within the time frame advised (usually within two weeks) to have your blood pressure rechecked and to review your medications.  If you are taking more than one medicine to lower your blood pressure, make sure you know how and at what times they should be taken. Taking two medicines at the same time can result in blood pressure that is too low. SEEK IMMEDIATE MEDICAL CARE IF:  You develop a severe headache, blurred or changing vision, or confusion.  You have unusual weakness or numbness, or a faint feeling.  You have severe chest or abdominal pain, vomiting, or breathing problems. MAKE SURE YOU:   Understand these instructions.  Will watch your condition.  Will get help right away if you are not doing well or get worse. Document Released: 07/25/2005 Document Revised: 10/17/2011 Document Reviewed: 03/14/2008 [facility] Patient Information 2014 [place], [place].

## 2013-03-01 NOTE — Assessment & Plan Note (Addendum)
BP Readings from Last 3 Encounters:  03/01/13 158/93  01/02/13 107/78  10/17/12 138/91    Lab Results  Component Value Date   NA 137 03/01/2013   K 3.6 03/01/2013   CREATININE 0.69 03/01/2013    Assessment: Blood pressure control: moderately elevated Progress toward BP goal:  Deteriorated  Comments: possibly elevated to increased salt intake i.e pretzels or recent addition of Naproxen or smoking  Plan: Medications:  continue current medications, Increased Norvasc to 10 mg qd. She is not orthostatic today Educational resources provided: handout Self management tools provided: instructions for home blood pressure monitoring, given BP log  Other plans: f/u in 2 weeks HTN and review BP log, encouraged smoking cessation, decreased salt intake

## 2013-03-01 NOTE — Assessment & Plan Note (Signed)
  Assessment: Progress toward smoking cessation:  smoking less Barriers to progress toward smoking cessation:   (duration of smoking) Comments: I dont think the patient is quit ready to quit though encouraged smoking less   Plan: Instruction/counseling given:  I counseled patient on the dangers of tobacco use, advised patient to stop smoking, and reviewed strategies to maximize success. Educational resources provided:  other (see comments) Self management tools provided:  other (see comments) Medications to assist with smoking cessation:  None Patient agreed to the following self-care plans for smoking cessation: none Other plans: reassess

## 2013-03-01 NOTE — Assessment & Plan Note (Signed)
She had CMET and CBC checked today due to being on Rifampin which are normal except AST 187 which is trending down from past. Rifampin may cause h/a, ALT, AST elevation, dizziness and vision changes as side effects.   Continue to take Rifampin and f/u with PCP

## 2013-03-01 NOTE — Assessment & Plan Note (Signed)
Could be 2/2 elevated BP today but patient is neurologically intact Advised to return to ED or clinic if sx's are worsening (i.e h/a, nausea/vomiting), neurological sx's  Given Tylenol 500 mg x 1

## 2013-03-01 NOTE — Progress Notes (Signed)
  Subjective:    Patient ID: Lorraine [name], female    DOB: 1960-12-15, 52 y.o.   MRN: [id number]  HPI Comments: 52 y.o woman PMH HCV, tobacco abuse (smoking 2-3 cigarettes qd), HTN (BP 170/89 then orthostatics lying 157/88 (HR 59) 161/89 (HR 57), 158/93 (HR 64).  She presented for lab work today CMET, CBC after being started on Rifampin for latent TB by her PCP.  She states she went to the dentist yesterday to have dental work done and her BP was too elevated and they would not do dental work.  BP was 180s-190s/100s.  She states she has been eating a lot of pretzels over the last couple of days and she is taking Naproxen for arthritis pain which is not helping much.  Today she has a 7-8/10 throbbing h/a crown of her head a occipital region w/o radiation.  H/A has occurred for 2 days.  She also has blurry vision (which was noted last 02/06/13 and she is awaiting eye MD exam), numbness in her feet and arms with h/o neuropathy, and muscle spasms (which are chronic).  She stated she also feels lightheaded today  Hypertension This is a chronic problem. The current episode started more than 1 year ago. The problem has been gradually worsening since onset. The problem is uncontrolled. Associated symptoms include blurred vision and headaches. Pertinent negatives include no chest pain, peripheral edema or shortness of breath. Agents associated with hypertension include NSAIDs. Risk factors for coronary artery disease include smoking/tobacco exposure and diabetes mellitus (DM history). Past treatments include ACE inhibitors, calcium channel blockers and diuretics. The current treatment provides no improvement. There are no compliance problems.  There is no history of chronic renal disease.      Review of Systems  Eyes: Positive for blurred vision.  Respiratory: Negative for shortness of breath.   Cardiovascular: Negative for chest pain.  Gastrointestinal: Negative for nausea and vomiting.  Neurological: Positive  for light-headedness and headaches.       Objective:   Physical Exam  Nursing note and vitals reviewed. Constitutional: She is oriented to person, place, and time. She appears well-developed and well-nourished. She is cooperative. No distress.  HENT:  Head: Normocephalic and atraumatic.  Mouth/Throat: Oropharynx is clear and moist and mucous membranes are normal. Abnormal dentition. No oropharyngeal exudate.  Eyes: Conjunctivae are normal. Pupils are equal, round, and reactive to light. Right eye exhibits no discharge. Left eye exhibits no discharge. No scleral icterus.  Cardiovascular: Regular rhythm, S1 normal, S2 normal and normal heart sounds.   No murmur heard. Intermittently bradycardic  Pulmonary/Chest: Effort normal and breath sounds normal. No respiratory distress. She has no wheezes.  Neurological: She is alert and oriented to person, place, and time. She has normal strength. No cranial nerve deficit. Gait normal.  Skin: Skin is warm, dry and intact. No rash noted. She is not diaphoretic.  Psychiatric: She has a normal mood and affect. Her speech is normal and behavior is normal. Judgment and thought content normal. Cognition and memory are normal.          Assessment & Plan:  F/u in 2 weeks HTN

## 2013-03-05 ENCOUNTER — Telehealth: Payer: Self-pay | Admitting: Internal Medicine

## 2013-03-05 DIAGNOSIS — M069 Rheumatoid arthritis, unspecified: Secondary | ICD-10-CM

## 2013-03-05 MED ORDER — TRAMADOL HCL 50 MG PO TABS: 50.0000 mg | ORAL_TABLET | Freq: Four times a day (QID) | ORAL | Status: DC | PRN

## 2013-03-05 NOTE — Telephone Encounter (Signed)
Called patient to inform her that LFTs are stable. She is continuing rifampin and has increased amlodipine to 2tabs.  Pain likely contributing to HTN.  Will add tramadol to regimen.  If ineffective, then consider OxyIR (avoid vicodin d/t tylenol)

## 2013-03-05 NOTE — Progress Notes (Signed)
Case discussed with Dr. McLean at the time of the visit.  We reviewed the resident's history and exam and pertinent patient test results.  I agree with the assessment, diagnosis, and plan of care documented in the resident's note.     

## 2013-03-15 ENCOUNTER — Ambulatory Visit: Payer: Medicaid Other | Admitting: Internal Medicine

## 2013-04-03 ENCOUNTER — Ambulatory Visit (INDEPENDENT_AMBULATORY_CARE_PROVIDER_SITE_OTHER): Payer: Medicaid Other | Admitting: Internal Medicine

## 2013-04-03 ENCOUNTER — Encounter: Payer: Self-pay | Admitting: Internal Medicine

## 2013-04-03 VITALS — BP 132/83 | HR 57 | Temp 99.0°F | Ht 63.0 in | Wt 119.1 lb

## 2013-04-03 DIAGNOSIS — M069 Rheumatoid arthritis, unspecified: Secondary | ICD-10-CM

## 2013-04-03 DIAGNOSIS — Z23 Encounter for immunization: Secondary | ICD-10-CM

## 2013-04-03 DIAGNOSIS — R7611 Nonspecific reaction to tuberculin skin test without active tuberculosis: Secondary | ICD-10-CM

## 2013-04-03 DIAGNOSIS — Z0000 Encounter for general adult medical examination without abnormal findings: Secondary | ICD-10-CM

## 2013-04-03 DIAGNOSIS — B182 Chronic viral hepatitis C: Secondary | ICD-10-CM

## 2013-04-03 DIAGNOSIS — E1140 Type 2 diabetes mellitus with diabetic neuropathy, unspecified: Secondary | ICD-10-CM

## 2013-04-03 DIAGNOSIS — I10 Essential (primary) hypertension: Secondary | ICD-10-CM

## 2013-04-03 DIAGNOSIS — E1142 Type 2 diabetes mellitus with diabetic polyneuropathy: Secondary | ICD-10-CM

## 2013-04-03 DIAGNOSIS — E1149 Type 2 diabetes mellitus with other diabetic neurological complication: Secondary | ICD-10-CM

## 2013-04-03 LAB — COMPREHENSIVE METABOLIC PANEL
ALT: 25 U/L (ref 0–35)
CO2: 29 mEq/L (ref 19–32)
Calcium: 9.1 mg/dL (ref 8.4–10.5)
Chloride: 101 mEq/L (ref 96–112)
Creat: 0.62 mg/dL (ref 0.50–1.10)
Sodium: 139 mEq/L (ref 135–145)
Total Protein: 7.2 g/dL (ref 6.0–8.3)

## 2013-04-03 LAB — CBC
Hemoglobin: 13.6 g/dL (ref 12.0–15.0)
MCH: 30.6 pg (ref 26.0–34.0)
MCV: 89.6 fL (ref 78.0–100.0)
Platelets: 347 10*3/uL (ref 150–400)
RBC: 4.44 MIL/uL (ref 3.87–5.11)
WBC: 6.9 10*3/uL (ref 4.0–10.5)

## 2013-04-03 LAB — POCT GLYCOSYLATED HEMOGLOBIN (HGB A1C): Hemoglobin A1C: 5.8

## 2013-04-03 LAB — GLUCOSE, CAPILLARY: Glucose-Capillary: 151 mg/dL — ABNORMAL HIGH (ref 70–99)

## 2013-04-03 MED ORDER — TRAMADOL HCL 50 MG PO TABS: 50.0000 mg | ORAL_TABLET | Freq: Four times a day (QID) | ORAL | Status: DC | PRN

## 2013-04-03 NOTE — Patient Instructions (Addendum)
General Instructions: -Today we will draw blood to monitor your liver and kidneys -Please have a chest x-ray done at [facility] on 05/09/13 -Please make an appointment with [facility] (the ones that set you up with that injection you have in the fridge) for mid October, as that is when you will have completed your Rifampin therapy -You may stop naproxen since it is not helping; if you notice the pain worsening, you may restart this medication; you may start tramadol, I have provided you with this script today; let me know if this does not help your pain  Please be sure to bring all of your medications with you to every visit.  Should you have any new or worsening symptoms, please be sure to call the clinic at [phone].   Treatment Goals:  Goals (1 Years of Data) as of 04/03/13         As of Today 03/01/13 03/01/13 03/01/13 03/01/13     Blood Pressure    . Blood Pressure < 140/80  132/83 158/93 161/89 157/88 170/89     Lifestyle    . Quit smoking / using tobacco           Result Component    . HEMOGLOBIN A1C < 7.0            Progress Toward Treatment Goals:  Treatment Goal 04/03/2013  Hemoglobin A1C -  Blood pressure at goal  Stop smoking smoking less    Self Care Goals & Plans:  Self Care Goal 04/03/2013  Manage my medications take my medicines as prescribed; refill my medications on time; bring my medications to every visit  Monitor my health keep track of my blood glucose; bring my glucose meter and log to each visit  Eat healthy foods eat foods that are low in salt; eat baked foods instead of fried foods  Be physically active find an activity I enjoy  Stop smoking cut down the number of cigarettes smoked; go to the [email or web address])  Meeting treatment goals -    Home Blood Glucose Monitoring 04/03/2013  Check my blood sugar once a day  When to check my blood sugar -     Care Management & Community Referrals:  Referral 03/01/2013   Referrals made for care management support none needed  Referrals made to community resources none

## 2013-04-03 NOTE — Assessment & Plan Note (Signed)
Etanercept on hold until after completion of Rifampin therapy for latent TB.  Naproxen ineffective for pain control.  Trial of Tramadol (patient never picked up medication from July). If pain worsens after d/c naproxen, may restart.
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
## 2013-04-03 NOTE — Assessment & Plan Note (Signed)
BP Readings from Last 3 Encounters:  04/03/13 132/83  03/01/13 158/93  01/02/13 107/78    Lab Results  Component Value Date   NA 137 03/01/2013   K 3.6 03/01/2013   CREATININE 0.69 03/01/2013    Assessment: Blood pressure control: controlled Progress toward BP goal:  at goal  Plan: Medications:  continue current medications - amlodipine 10, quinipril-HCTZ 10-12.5 Educational resources provided: brochure Self management tools provided: home blood pressure logbook

## 2013-04-03 NOTE — Assessment & Plan Note (Signed)
Flu shot today; patient to make eye exam appt

## 2013-04-03 NOTE — Assessment & Plan Note (Addendum)
Patient reports Compliance without side effects to Rifampin therapy; I called [facility], and med (Rifampin 300mg  tabs) were filled on 02/06/13 #60 (daily dose of 600mg ; which is 1 month supply); this means Lorraine [name] has gone 1 month without medications, though Lorraine [name] reports that Lorraine [name] is compliant when I called her back, and Lorraine [name] has 10 tablets left.  The only way this is possible is if Lorraine [name] didn't start her medication when Lorraine [name] filled it.  Prior to deciding that therapy is complete in Oct/Nov, we will need to confirm the appropriate amt of bottles filled from pharmacy.  -Check CMET, CBC today -Plan for CXR on 05/09/13

## 2013-04-03 NOTE — Assessment & Plan Note (Addendum)
Lab Results  Component Value Date   HGBA1C 5.8 04/03/2013   HGBA1C 5.8 01/02/2013   HGBA1C 5.7 10/17/2012     Assessment: Diabetes control: good control (HgbA1C at goal) Progress toward A1C goal:  at goal  Plan: Medications:  diet controlled, off metformin (?hypoglycemia) Home glucose monitoring: Frequency: once a day Instruction/counseling given: reminded to bring blood glucose meter & log to each visit and reminded to bring medications to each visit; reminded to get eye exam Educational resources provided: brochure Self management tools provided: copy of home glucose meter download

## 2013-04-04 NOTE — Progress Notes (Signed)
Case discussed with Dr. Sharda soon after the resident saw the patient.  We reviewed the resident's history and exam and pertinent patient test results.  I agree with the assessment, diagnosis, and plan of care documented in the resident's note. 

## 2013-04-11 ENCOUNTER — Other Ambulatory Visit: Payer: Self-pay | Admitting: Internal Medicine

## 2013-04-11 DIAGNOSIS — D242 Benign neoplasm of left breast: Secondary | ICD-10-CM

## 2013-04-29 ENCOUNTER — Telehealth: Payer: Self-pay | Admitting: *Deleted

## 2013-04-29 NOTE — Telephone Encounter (Signed)
Pt called needs something for pain - naproxen is not helping. Pt states you suggest ibuprofen. Uses [facility]. Still trying to make appt [facility]. Stanton Kidney Jody Silas RN 04/29/13 2:30PM

## 2013-04-29 NOTE — Telephone Encounter (Signed)
On 04/03/13, Dr. Everardo Beals suggested tramadol. Please ask patient to try that instead.

## 2013-05-01 ENCOUNTER — Other Ambulatory Visit: Payer: Self-pay | Admitting: *Deleted

## 2013-05-01 NOTE — Telephone Encounter (Signed)
Pt calls and states dr Everardo Beals told her that she might just need ibuprofen, i do not see a note to this effect but she says she does not want tramadol or naproxen because they do not work, she just wants plain ibuprofen. Also she is waiting for a referral to [facility]. Will send to attending and dr Everardo Beals

## 2013-05-03 MED ORDER — IBUPROFEN 200 MG PO CAPS: 400.0000 mg | ORAL_CAPSULE | Freq: Three times a day (TID) | ORAL | Status: DC | PRN

## 2013-05-03 NOTE — Telephone Encounter (Signed)
Ibuprofen ordered per patient preference.

## 2013-05-03 NOTE — Telephone Encounter (Signed)
Pt aware of response.  

## 2013-05-09 ENCOUNTER — Other Ambulatory Visit (INDEPENDENT_AMBULATORY_CARE_PROVIDER_SITE_OTHER): Payer: Medicaid Other

## 2013-05-09 ENCOUNTER — Ambulatory Visit (HOSPITAL_COMMUNITY)
Admission: RE | Admit: 2013-05-09 | Discharge: 2013-05-09 | Disposition: A | Discharge status: Home / Self Care | Payer: Medicaid Other | Source: Ambulatory Visit | Attending: Internal Medicine | Admitting: Internal Medicine

## 2013-05-09 DIAGNOSIS — R7611 Nonspecific reaction to tuberculin skin test without active tuberculosis: Secondary | ICD-10-CM

## 2013-05-09 DIAGNOSIS — Z227 Latent tuberculosis: Secondary | ICD-10-CM

## 2013-05-09 DIAGNOSIS — Z8611 Personal history of tuberculosis: Secondary | ICD-10-CM | POA: Insufficient documentation

## 2013-05-09 LAB — COMPREHENSIVE METABOLIC PANEL
ALT: 21 U/L (ref 0–35)
AST: 160 U/L — ABNORMAL HIGH (ref 0–37)
Alkaline Phosphatase: 70 U/L (ref 39–117)
Creat: 0.66 mg/dL (ref 0.50–1.10)
Total Bilirubin: 0.5 mg/dL (ref 0.3–1.2)

## 2013-05-14 ENCOUNTER — Other Ambulatory Visit: Payer: Self-pay | Admitting: *Deleted

## 2013-05-14 DIAGNOSIS — E1140 Type 2 diabetes mellitus with diabetic neuropathy, unspecified: Secondary | ICD-10-CM

## 2013-05-21 ENCOUNTER — Encounter: Payer: Self-pay | Admitting: *Deleted

## 2013-05-27 ENCOUNTER — Ambulatory Visit
Admission: RE | Admit: 2013-05-27 | Discharge: 2013-05-27 | Disposition: A | Discharge status: Home / Self Care | Payer: Medicaid Other | Source: Ambulatory Visit | Attending: Family Medicine | Admitting: Family Medicine

## 2013-05-27 ENCOUNTER — Other Ambulatory Visit: Payer: Self-pay | Admitting: Family Medicine

## 2013-05-27 DIAGNOSIS — D242 Benign neoplasm of left breast: Secondary | ICD-10-CM

## 2013-05-28 ENCOUNTER — Ambulatory Visit
Admission: RE | Admit: 2013-05-28 | Discharge: 2013-05-28 | Disposition: A | Discharge status: Home / Self Care | Payer: Medicaid Other | Source: Ambulatory Visit | Attending: Family Medicine | Admitting: Family Medicine

## 2013-05-28 DIAGNOSIS — D242 Benign neoplasm of left breast: Secondary | ICD-10-CM

## 2013-07-30 ENCOUNTER — Encounter: Payer: Self-pay | Admitting: Internal Medicine

## 2013-07-30 ENCOUNTER — Ambulatory Visit (INDEPENDENT_AMBULATORY_CARE_PROVIDER_SITE_OTHER): Payer: Medicaid Other | Admitting: Internal Medicine

## 2013-07-30 VITALS — BP 135/91 | HR 67 | Temp 97.7°F | Ht 63.0 in | Wt 123.4 lb

## 2013-07-30 DIAGNOSIS — R7611 Nonspecific reaction to tuberculin skin test without active tuberculosis: Secondary | ICD-10-CM

## 2013-07-30 DIAGNOSIS — E1140 Type 2 diabetes mellitus with diabetic neuropathy, unspecified: Secondary | ICD-10-CM

## 2013-07-30 DIAGNOSIS — E1142 Type 2 diabetes mellitus with diabetic polyneuropathy: Secondary | ICD-10-CM

## 2013-07-30 DIAGNOSIS — B182 Chronic viral hepatitis C: Secondary | ICD-10-CM

## 2013-07-30 DIAGNOSIS — E119 Type 2 diabetes mellitus without complications: Secondary | ICD-10-CM

## 2013-07-30 DIAGNOSIS — M069 Rheumatoid arthritis, unspecified: Secondary | ICD-10-CM

## 2013-07-30 DIAGNOSIS — I10 Essential (primary) hypertension: Secondary | ICD-10-CM

## 2013-07-30 DIAGNOSIS — E1149 Type 2 diabetes mellitus with other diabetic neurological complication: Secondary | ICD-10-CM

## 2013-07-30 LAB — COMPREHENSIVE METABOLIC PANEL
ALT: 35 U/L (ref 0–35)
Albumin: 4.3 g/dL (ref 3.5–5.2)
Alkaline Phosphatase: 85 U/L (ref 39–117)
CO2: 29 mEq/L (ref 19–32)
Glucose, Bld: 88 mg/dL (ref 70–99)
Potassium: 3.5 mEq/L (ref 3.5–5.3)
Sodium: 138 mEq/L (ref 135–145)
Total Protein: 7.9 g/dL (ref 6.0–8.3)

## 2013-07-30 LAB — POCT GLYCOSYLATED HEMOGLOBIN (HGB A1C): Hemoglobin A1C: 6.1

## 2013-07-30 LAB — GLUCOSE, CAPILLARY: Glucose-Capillary: 157 mg/dL — ABNORMAL HIGH (ref 70–99)

## 2013-07-30 MED ORDER — AGAMATRIX ULTRA-THIN LANCETS MISC: 1.0000 | Freq: Every day | Status: DC

## 2013-07-30 MED ORDER — RIFAMPIN 300 MG PO CAPS: 600.0000 mg | ORAL_CAPSULE | Freq: Every day | ORAL | Status: DC

## 2013-07-30 MED ORDER — QUINAPRIL-HYDROCHLOROTHIAZIDE 20-25 MG PO TABS: 1.0000 | ORAL_TABLET | Freq: Every day | ORAL | Status: DC

## 2013-07-30 MED ORDER — GLUCOSE BLOOD VI STRP: ORAL_STRIP | Status: DC

## 2013-07-30 NOTE — Assessment & Plan Note (Signed)
BP Readings from Last 3 Encounters:  07/30/13 135/91  04/03/13 132/83  03/01/13 158/93    Lab Results  Component Value Date   NA 137 05/09/2013   K 3.8 05/09/2013   CREATININE 0.66 05/09/2013    Assessment: Blood pressure control: controlled Progress toward BP goal:  at goal  Plan: Medications:  d/c amlodipine, increase quinapril-hctz to 20-25 daily to simplify regimen Educational resources provided: brochure Self management tools provided:  n/a Other plans: CMET today, monitor in 1 month
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
## 2013-07-30 NOTE — Assessment & Plan Note (Signed)
Etanercept still on hold. Cont Rifampin tx. Ibuprofen effective for now, hasn't filled tramadol.  May prescribe tramadol if needed.

## 2013-07-30 NOTE — Patient Instructions (Signed)
General Instructions:  -Regarding your inactive TB treatment, it is VERY important for you to complete proper treatment so you can start those injections for your rheumatoid arthritis.  Finish the bottle you have (2 tablets a day), and 3 refills have been sent to [facility].  Please DO NOT miss any doses - 2 tablets per day.  -Regarding your blood pressure, you are doing a great job!  Let's try to simplify your regimen.  Stop taking Amlodipine for now.  Finish up the other medication (Accuretic, also called quinapril-hydrochlorothiazide, current dose is 10-12.5) by taking TWO (2) tablets DAILY.  The new refill I have sent in is dose adjusted, so each tablet is 20-25mg .  When you get the refill, you may take ONE (1) tablet daily.  -Regarding your diabetes, you're doing great!  Keep up your excellent diet and exercise - check your blood sugar whenever you don't feel right.  Please be sure to bring all of your medications with you to every visit.  Should you have any new or worsening symptoms, please be sure to call the clinic at [phone].   Treatment Goals:  Goals (1 Years of Data) as of 07/30/13         As of Today 04/03/13 03/01/13 03/01/13 03/01/13     Blood Pressure    . Blood Pressure < 140/80  135/91 132/83 158/93 161/89 157/88     Lifestyle    . Quit smoking / using tobacco           Result Component    . HEMOGLOBIN A1C < 7.0  6.1 5.8         Progress Toward Treatment Goals:  Treatment Goal 07/30/2013  Hemoglobin A1C -  Blood pressure at goal  Stop smoking smoking the same amount    Self Care Goals & Plans:  Self Care Goal 07/30/2013  Manage my medications take my medicines as prescribed; bring my medications to every visit  Monitor my health keep track of my blood glucose; bring my glucose meter and log to each visit  Eat healthy foods drink diet soda or water instead of juice or soda; eat more vegetables; eat foods that are low in salt; eat baked foods instead of fried  foods  Be physically active take a walk every day  Stop smoking call [facility] (1-800-QUIT-NOW); cut down the number of cigarettes smoked  Meeting treatment goals -    Home Blood Glucose Monitoring 07/30/2013  Check my blood sugar (No Data)  When to check my blood sugar N/A     Care Management & Community Referrals:  Referral 07/30/2013  Referrals made for care management support -  Referrals made to community resources none

## 2013-07-30 NOTE — Assessment & Plan Note (Signed)
Lab Results  Component Value Date   HGBA1C 6.1 07/30/2013   HGBA1C 5.8 04/03/2013   HGBA1C 5.8 01/02/2013     Assessment: Diabetes control: good control (HgbA1C at goal) Progress toward A1C goal:     Plan: Medications:  diet controlled, was on metformin in the past, d/c due to ?hypoglycemia Home glucose monitoring: Frequency:  (as needed) Timing: N/A Instruction/counseling given: reminded to bring blood glucose meter & log to each visit and reminded to bring medications to each visit Educational resources provided: brochure Self management tools provided: copy of home glucose meter download

## 2013-07-30 NOTE — Assessment & Plan Note (Signed)
Patient had not been compliant with Rifampin daily - she still has 14 tabs in a bottle filled on 9/29 - I stressed the importance of completing this therapy appropriately so she can start Etanercept tx for RA.  She seems to understand.  We will continue another 3 months of Rifampin tx, 600mg  daily, to complete tx ~10/28/12.  -Refill Rifampin 600mg  daily -CMET today.

## 2013-08-05 ENCOUNTER — Other Ambulatory Visit: Payer: Self-pay | Admitting: *Deleted

## 2013-08-05 MED ORDER — GLUCOSE BLOOD VI STRP: ORAL_STRIP | Status: DC

## 2013-08-05 MED ORDER — ACCU-CHEK AVIVA PLUS W/DEVICE KIT: PACK | Status: DC

## 2013-08-05 MED ORDER — ACCU-CHEK MULTICLIX LANCETS MISC: Status: DC

## 2013-08-05 NOTE — Progress Notes (Signed)
Case discussed with Dr. Sharda at time of visit.  We reviewed the resident's history and exam and pertinent patient test results.  I agree with the assessment, diagnosis, and plan of care documented in the resident's note. 

## 2013-08-05 NOTE — Telephone Encounter (Signed)
From [facility] - pt's insurance, [place], prefers Accu-chek products. Thanks

## 2013-08-06 ENCOUNTER — Telehealth: Payer: Self-pay | Admitting: *Deleted

## 2013-08-06 NOTE — Telephone Encounter (Signed)
Pt states she having difficulty filling rx for test strips. According to [facility], her type of strips have to be ordered and will be ready tomorrow. HT also states they will call and inform pt.

## 2013-08-30 ENCOUNTER — Encounter: Payer: Medicaid Other | Admitting: Internal Medicine

## 2013-09-06 ENCOUNTER — Ambulatory Visit (INDEPENDENT_AMBULATORY_CARE_PROVIDER_SITE_OTHER): Payer: Medicaid Other | Admitting: Internal Medicine

## 2013-09-06 ENCOUNTER — Encounter: Payer: Self-pay | Admitting: Internal Medicine

## 2013-09-06 VITALS — BP 149/90 | HR 65 | Temp 97.5°F | Ht 63.0 in | Wt 123.6 lb

## 2013-09-06 DIAGNOSIS — R7611 Nonspecific reaction to tuberculin skin test without active tuberculosis: Secondary | ICD-10-CM

## 2013-09-06 DIAGNOSIS — M069 Rheumatoid arthritis, unspecified: Secondary | ICD-10-CM

## 2013-09-06 DIAGNOSIS — F17200 Nicotine dependence, unspecified, uncomplicated: Secondary | ICD-10-CM

## 2013-09-06 DIAGNOSIS — I10 Essential (primary) hypertension: Secondary | ICD-10-CM

## 2013-09-06 DIAGNOSIS — E1142 Type 2 diabetes mellitus with diabetic polyneuropathy: Secondary | ICD-10-CM

## 2013-09-06 DIAGNOSIS — E1149 Type 2 diabetes mellitus with other diabetic neurological complication: Secondary | ICD-10-CM

## 2013-09-06 DIAGNOSIS — E1140 Type 2 diabetes mellitus with diabetic neuropathy, unspecified: Secondary | ICD-10-CM

## 2013-09-06 LAB — COMPREHENSIVE METABOLIC PANEL
ALBUMIN: 4.1 g/dL (ref 3.5–5.2)
ALT: 42 U/L — ABNORMAL HIGH (ref 0–35)
AST: 205 U/L — ABNORMAL HIGH (ref 0–37)
Alkaline Phosphatase: 87 U/L (ref 39–117)
BUN: 9 mg/dL (ref 6–23)
CALCIUM: 9.6 mg/dL (ref 8.4–10.5)
CHLORIDE: 98 meq/L (ref 96–112)
CO2: 31 meq/L (ref 19–32)
Creat: 0.64 mg/dL (ref 0.50–1.10)
Glucose, Bld: 82 mg/dL (ref 70–99)
POTASSIUM: 3.6 meq/L (ref 3.5–5.3)
Sodium: 136 mEq/L (ref 135–145)
Total Bilirubin: 0.8 mg/dL (ref 0.2–1.2)
Total Protein: 7.9 g/dL (ref 6.0–8.3)

## 2013-09-06 LAB — GLUCOSE, CAPILLARY: Glucose-Capillary: 119 mg/dL — ABNORMAL HIGH (ref 70–99)

## 2013-09-06 MED ORDER — IBUPROFEN 800 MG PO TABS: 800.0000 mg | ORAL_TABLET | Freq: Three times a day (TID) | ORAL | Status: DC

## 2013-09-06 NOTE — Assessment & Plan Note (Signed)
  Assessment: Progress toward smoking cessation:  smoking the same amount (1-2 cg/day) Barriers to progress toward smoking cessation:  other (see comments) (social)  Plan: Instruction/counseling given:  I counseled patient on the dangers of tobacco use, advised patient to stop smoking, and reviewed strategies to maximize success. Educational resources provided:  [facility] [employer] account manager) brochure Self management tools provided:    Medications to assist with smoking cessation:  None Patient agreed to the following self-care plans for smoking cessation: go to the [employer] (www.quitlinenc.com);call [facility] (1-800-QUIT-NOW)

## 2013-09-06 NOTE — Assessment & Plan Note (Signed)
Seems to have been compliant with rifampin for the last month - will have patient return every month to ensure she is completing therapy, as her noncompliance in the past has delayed etanercept tx, and her RA is starting to bother her more.  -CMET today d/t rifampin

## 2013-09-06 NOTE — Assessment & Plan Note (Signed)
Completing Rifampin therapy d/t positive TB screen; plan to start etanercept at [facility] once tx complete.  Ibuprofen 800 tid for pain
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
## 2013-09-06 NOTE — Assessment & Plan Note (Signed)
BP Readings from Last 3 Encounters:  09/06/13 149/90  07/30/13 135/91  04/03/13 132/83    Lab Results  Component Value Date   NA 138 07/30/2013   K 3.5 07/30/2013   CREATININE 0.60 07/30/2013    Assessment: Blood pressure control: mildly elevated Progress toward BP goal:  deteriorated  Plan: Medications:  continue current medications Educational resources provided: brochure;handout;video Other plans: has been well controlled in the past with amlodipine on board, which we d/c on 07/30/14; if remains elevated at next visit, we can increase quinapril to 40 (may have to split up combo pill); CMET today

## 2013-09-06 NOTE — Assessment & Plan Note (Signed)
No s/s of hypo/hyperglycemia, CBG well controlled today, not due for A1c.

## 2013-09-06 NOTE — Patient Instructions (Addendum)
General Instructions:  -Keep up the good work with your medicatiions - you have all of February and all of March left of the Rifampin, then we can get you back to [facility] for treatment of your rheumatoid arthritis  -For your arthritis, you may take ibuprofen 800mg  three times per day (every 8 hours).  If your start to have any stomach discomfort, please let me know.  Be sure to take this medication with food.  Please be sure to bring all of your medications with you to every visit.  Should you have any new or worsening symptoms, please be sure to call the clinic at [phone].   Treatment Goals:  Goals (1 Years of Data) as of 09/06/13         As of Today 07/30/13 04/03/13 03/01/13 03/01/13     Blood Pressure    . Blood Pressure < 140/80  149/90 135/91 132/83 158/93 161/89     Lifestyle    . Quit smoking / using tobacco           Result Component    . HEMOGLOBIN A1C < 7.0   6.1 5.8        Progress Toward Treatment Goals:  Treatment Goal 09/06/2013  Hemoglobin A1C at goal  Blood pressure deteriorated  Stop smoking smoking the same amount    Self Care Goals & Plans:  Self Care Goal 09/06/2013  Manage my medications take my medicines as prescribed; bring my medications to every visit; refill my medications on time; follow the sick day instructions if I am sick  Monitor my health keep track of my blood glucose; bring my glucose meter and log to each visit; keep track of my blood pressure; bring my blood pressure log to each visit; keep track of my weight; check my feet daily  Eat healthy foods eat more vegetables; eat fruit for snacks and desserts; eat smaller portions; eat foods that are low in salt; eat baked foods instead of fried foods; drink diet soda or water instead of juice or soda  Be physically active find an activity I enjoy  Stop smoking go to the [employer] (https://scott-booker.info/); call [facility] (1-800-QUIT-NOW)  Meeting treatment goals maintain the current self-care  plan    Home Blood Glucose Monitoring 09/06/2013  Check my blood sugar (No Data)  When to check my blood sugar N/A     Care Management & Community Referrals:  Referral 07/30/2013  Referrals made for care management support -  Referrals made to community resources none

## 2013-09-09 NOTE — Progress Notes (Signed)
Case discussed with Dr. Sharda soon after the resident saw the patient.  We reviewed the resident's history and exam and pertinent patient test results.  I agree with the assessment, diagnosis, and plan of care documented in the resident's note. 

## 2013-09-16 ENCOUNTER — Telehealth: Payer: Self-pay | Admitting: *Deleted

## 2013-09-16 DIAGNOSIS — I10 Essential (primary) hypertension: Secondary | ICD-10-CM

## 2013-09-16 NOTE — Telephone Encounter (Signed)
May change to lisinopril-hctz 20-25 -thanks!

## 2013-09-16 NOTE — Telephone Encounter (Signed)
Received faxed PA request from pt's pharmacy for her quinapril/hctz 20-25 take one tab daily.    Medication is non-preferred with medicaid.   Preferred meds include the following:   benazepril/HCTZ (generic for Lotensin HCT)   captopril/HCTZ (generic for Capozide)   enalapril/HCTZ (generic for Vaseretic)   lisinopril/HCTZ (generic for Prinzide and Zestoretic)    Will forward list to pcp for review.  Please advise.Lorraine Hidden Cassady2/9/20151:25 PM

## 2013-09-23 NOTE — Telephone Encounter (Signed)
Pt made aware, rx called into pharmacy, medication updated on pt's medication list.  Will send to pcp for signature.Marland KitchenMarland KitchenDespina Hidden Cassady2/16/201511:49 AM

## 2013-10-04 ENCOUNTER — Ambulatory Visit: Payer: Medicaid Other | Admitting: Internal Medicine

## 2013-10-08 ENCOUNTER — Encounter: Payer: Self-pay | Admitting: Internal Medicine

## 2013-10-08 ENCOUNTER — Ambulatory Visit (INDEPENDENT_AMBULATORY_CARE_PROVIDER_SITE_OTHER): Payer: Medicaid Other | Admitting: Internal Medicine

## 2013-10-08 VITALS — BP 128/77 | HR 57 | Temp 97.1°F | Ht 63.0 in | Wt 124.1 lb

## 2013-10-08 DIAGNOSIS — E1149 Type 2 diabetes mellitus with other diabetic neurological complication: Secondary | ICD-10-CM

## 2013-10-08 DIAGNOSIS — I10 Essential (primary) hypertension: Secondary | ICD-10-CM

## 2013-10-08 LAB — GLUCOSE, CAPILLARY: GLUCOSE-CAPILLARY: 141 mg/dL — AB (ref 70–99)

## 2013-10-08 NOTE — Assessment & Plan Note (Signed)
BP Readings from Last 3 Encounters:  10/08/13 128/77  09/06/13 149/90  07/30/13 135/91    Lab Results  Component Value Date   NA 136 09/06/2013   K 3.6 09/06/2013   CREATININE 0.64 09/06/2013    Assessment: Blood pressure control: controlled Progress toward BP goal:  at goal Comments:   Plan: Medications:  continue current medications, lisinopril - HCTZ 20/25 mg 1 pill daily Educational resources provided:   Self management tools provided: instructions for home blood pressure monitoring Other plans:

## 2013-10-08 NOTE — Patient Instructions (Signed)
Keep taking your medicine for the TB so that we can treat your arthritis.   Your blood pressure is good today! Congratulations!  Come back in 6 weeks with your doctor.  Call us if you have any problems or questions before then at [phone].

## 2013-10-08 NOTE — Progress Notes (Signed)
Case discussed with Dr. Kollar soon after the resident saw the patient.  We reviewed the resident's history and exam and pertinent patient test results.  I agree with the assessment, diagnosis, and plan of care documented in the resident's note. 

## 2013-10-08 NOTE — Progress Notes (Signed)
Subjective:     Patient ID: Lorraine [name], female   DOB: Feb 25, 1961, 53 y.o.   MRN: [id number]  HPI The patient is a 53 YO female who comes in for BP recheck and medication compliance check. She has PMH of RA, positive TB screen (taking rifampin), type 2 diabetes controlled with diet, tobacco abuse. She is not having any problems or concerns at today's visit. She wants to know if there is any beverage she can drink other than diet sodas. We discuss crystal light or other powders to add to water and make sure to check that they are 0 calories and do not have large amounts of sugar.   Review of Systems  Constitutional: Negative.  Negative for fever, chills, diaphoresis, activity change, appetite change, fatigue and unexpected weight change.  Respiratory: Negative for cough, chest tightness, shortness of breath and wheezing.   Cardiovascular: Negative for chest pain, palpitations and leg swelling.  Musculoskeletal: Positive for arthralgias.  Neurological: Negative for dizziness, tremors, seizures, syncope, facial asymmetry, speech difficulty, weakness, light-headedness, numbness and headaches.       Objective:   Physical Exam  Constitutional: She appears well-developed and well-nourished. No distress.  HENT:  Head: Normocephalic and atraumatic.  Eyes: EOM are normal. Pupils are equal, round, and reactive to light.  Neck: Normal range of motion. Neck supple. No JVD present. No tracheal deviation present. No thyromegaly present.  Cardiovascular: Normal rate and regular rhythm.   Pulmonary/Chest: Effort normal and breath sounds normal. No respiratory distress. She has no wheezes. She has no rales. She exhibits no tenderness.  Abdominal: Soft. Bowel sounds are normal. She exhibits no distension. There is no tenderness. There is no rebound.  Musculoskeletal: Normal range of motion. She exhibits tenderness. She exhibits no edema.  Some nodules on hand and one missing DIP on left hand.  Skin: She is  not diaphoretic.       Assessment/Plan:   1. TB medication check - She did bring in her bottles today and her rifampin bottle had approx 20-25 pills in it and was filled on 2/2 which likely means she has been missing a few doses. However encouraged her to continue taking and refill bottle and take that whole bottle. She understands and intends to do so.   2. Disposition - Return in 6 weeks with PCP. BP good today and no medication changes.

## 2013-10-13 ENCOUNTER — Encounter (HOSPITAL_COMMUNITY): Payer: Self-pay | Admitting: Emergency Medicine

## 2013-10-13 ENCOUNTER — Emergency Department (HOSPITAL_COMMUNITY)
Admission: EM | Admit: 2013-10-13 | Discharge: 2013-10-13 | Disposition: A | Discharge status: Home / Self Care | Payer: Medicaid Other | Attending: Emergency Medicine | Admitting: Emergency Medicine

## 2013-10-13 DIAGNOSIS — Z79899 Other long term (current) drug therapy: Secondary | ICD-10-CM | POA: Insufficient documentation

## 2013-10-13 DIAGNOSIS — M25579 Pain in unspecified ankle and joints of unspecified foot: Secondary | ICD-10-CM | POA: Insufficient documentation

## 2013-10-13 DIAGNOSIS — I10 Essential (primary) hypertension: Secondary | ICD-10-CM | POA: Insufficient documentation

## 2013-10-13 DIAGNOSIS — IMO0002 Reserved for concepts with insufficient information to code with codable children: Secondary | ICD-10-CM | POA: Insufficient documentation

## 2013-10-13 DIAGNOSIS — F17200 Nicotine dependence, unspecified, uncomplicated: Secondary | ICD-10-CM | POA: Insufficient documentation

## 2013-10-13 DIAGNOSIS — M25549 Pain in joints of unspecified hand: Secondary | ICD-10-CM | POA: Insufficient documentation

## 2013-10-13 DIAGNOSIS — M1990 Unspecified osteoarthritis, unspecified site: Secondary | ICD-10-CM | POA: Insufficient documentation

## 2013-10-13 DIAGNOSIS — M069 Rheumatoid arthritis, unspecified: Secondary | ICD-10-CM | POA: Insufficient documentation

## 2013-10-13 DIAGNOSIS — Z8611 Personal history of tuberculosis: Secondary | ICD-10-CM | POA: Insufficient documentation

## 2013-10-13 DIAGNOSIS — E119 Type 2 diabetes mellitus without complications: Secondary | ICD-10-CM | POA: Insufficient documentation

## 2013-10-13 DIAGNOSIS — Z8619 Personal history of other infectious and parasitic diseases: Secondary | ICD-10-CM | POA: Insufficient documentation

## 2013-10-13 DIAGNOSIS — Z8719 Personal history of other diseases of the digestive system: Secondary | ICD-10-CM | POA: Insufficient documentation

## 2013-10-13 DIAGNOSIS — Z8742 Personal history of other diseases of the female genital tract: Secondary | ICD-10-CM | POA: Insufficient documentation

## 2013-10-13 DIAGNOSIS — M2550 Pain in unspecified joint: Secondary | ICD-10-CM

## 2013-10-13 DIAGNOSIS — Z8781 Personal history of (healed) traumatic fracture: Secondary | ICD-10-CM | POA: Insufficient documentation

## 2013-10-13 HISTORY — DX: Respiratory tuberculosis unspecified: A15.9

## 2013-10-13 MED ORDER — KETOROLAC TROMETHAMINE 60 MG/2ML IM SOLN
60.0000 mg | Freq: Once | INTRAMUSCULAR | Status: AC
  Administered 2013-10-13: 60 mg via INTRAMUSCULAR
  Filled 2013-10-13: qty 2

## 2013-10-13 NOTE — ED Notes (Signed)
Patient is alert and orientedx4.  Patient was explained discharge instructions and they understood them with no questions.   
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
## 2013-10-13 NOTE — ED Notes (Signed)
Pt reports hx arthritis. She has had swelling and pain to finger and toe joints for 1 month. Pt taking ibuprofen at home with help with pain.

## 2013-10-13 NOTE — Discharge Instructions (Signed)
Continue Ibuprofen as prescribed. Follow up with your doctor in 2 days for further management of your Rheumatoid Arthritis. Return to Emergency Department if your pain worsens, you develop any redness or warmth over the affected joints.    Arthralgia Arthralgia is joint pain. A joint is a place where two bones meet. Joint pain can happen for many reasons. The joint can be bruised, stiff, infected, or weak from aging. Pain usually goes away after resting and taking medicine for soreness.  HOME CARE  Rest the joint as told by your doctor.  Keep the sore joint raised (elevated) for the first 24 hours.  Put ice on the joint area.  Put ice in a plastic bag.  Place a towel between your skin and the bag.  Leave the ice on for 15-20 minutes, 03-04 times a day.  Wear your splint, casting, elastic bandage, or sling as told by your doctor.  Only take medicine as told by your doctor. Do not take aspirin.  Use crutches as told by your doctor. Do not put weight on the joint until told to by your doctor. GET HELP RIGHT AWAY IF:   You have bruising, puffiness (swelling), or more pain.  Your fingers or toes turn blue or start to lose feeling (numb).  Your medicine does not lessen the pain.  Your pain becomes severe.  You have a temperature by mouth above 102 F (38.9 C), not controlled by medicine.  You cannot move or use the joint. MAKE SURE YOU:   Understand these instructions.  Will watch your condition.  Will get help right away if you are not doing well or get worse. Document Released: 07/13/2009 Document Revised: 10/17/2011 Document Reviewed: 07/13/2009 [facility] Patient Information 2014 [place], [place].

## 2013-10-14 NOTE — ED Provider Notes (Signed)
Medical screening examination/treatment/procedure(s) were performed by non-physician practitioner and as supervising physician I was immediately available for consultation/collaboration.   EKG Interpretation None       Babette Relic, MD 10/14/13 [phone]

## 2013-10-22 ENCOUNTER — Ambulatory Visit (INDEPENDENT_AMBULATORY_CARE_PROVIDER_SITE_OTHER): Payer: Medicaid Other | Admitting: Internal Medicine

## 2013-10-22 ENCOUNTER — Encounter: Payer: Self-pay | Admitting: Internal Medicine

## 2013-10-22 VITALS — BP 120/79 | HR 61 | Temp 98.6°F | Ht 63.0 in | Wt 123.3 lb

## 2013-10-22 DIAGNOSIS — E119 Type 2 diabetes mellitus without complications: Secondary | ICD-10-CM

## 2013-10-22 DIAGNOSIS — M79609 Pain in unspecified limb: Secondary | ICD-10-CM

## 2013-10-22 NOTE — Progress Notes (Signed)
Subjective:     Patient ID: Lorraine [name], female   DOB: 10-Mar-1961, 53 y.o.   MRN: [id number]  HPI The patient is a 53 YO female who comes in for follow up of an ER visit. She was seen on 10/13/13 at the ER for joint paint. She has PMH of RA, positive TB screen (taking rifampin), type 2 diabetes controlled with diet, tobacco abuse. She was given toradol shot in the ED and advised to continue taking ibuprofen. This has helped her pain and she is doing good with taking her medicines for TB since last visit. She has new bottle with her today filled on 10/11/13 (although at last visit it would appear that she had enough pills to last for 1-2 weeks on 10/08/13). She would like me to look at her scalp which has been itching. She has been using anti-fungal shampoo on it but is now getting some itching with occasional burning. She has not noticed any lice or flaking on her scalp.    Review of Systems  Constitutional: Negative.  Negative for fever, chills, diaphoresis, activity change, appetite change, fatigue and unexpected weight change.  Respiratory: Negative for cough, chest tightness, shortness of breath and wheezing.   Cardiovascular: Negative for chest pain, palpitations and leg swelling.  Musculoskeletal: Positive for arthralgias.  Skin:       Itching scalp  Neurological: Negative for dizziness, tremors, seizures, syncope, facial asymmetry, speech difficulty, weakness, light-headedness, numbness and headaches.       Objective:   Physical Exam  Constitutional: She appears well-developed and well-nourished. No distress.  HENT:  Head: Normocephalic and atraumatic.  Scalp without evidence of tinea, lice. Does appear to be mildly dry without flaking of the scalp. No hair loss.  Eyes: EOM are normal. Pupils are equal, round, and reactive to light.  Neck: Normal range of motion. Neck supple. No JVD present. No tracheal deviation present. No thyromegaly present.  Cardiovascular: Normal rate and regular  rhythm.   Pulmonary/Chest: Effort normal and breath sounds normal. No respiratory distress. She has no wheezes. She has no rales. She exhibits no tenderness.  Abdominal: Soft. Bowel sounds are normal. She exhibits no distension. There is no tenderness. There is no rebound.  Musculoskeletal: Normal range of motion. She exhibits tenderness. She exhibits no edema.  Some nodules on hand and one missing DIP on left hand.  Skin: She is not diaphoretic.       Assessment/Plan:   1. ER follow up - Patient is doing relatively well and would continue with NSAIDS. No fungal, lice infection on her scalp and advised to stop the medicated shampoo and switch to mild shampoo as scalp was slightly dry.   2. Disposition - Return as scheduled and no changes to medicines today. Diabetic foot exam performed as patient was complaining of foot joint pain. Advised to use heat and ice on her sore joints and continue using ibuprofen.

## 2013-10-22 NOTE — Progress Notes (Signed)
Case discussed with Dr. Doug Sou soon after the resident saw the patient.  We reviewed the resident's history and exam and pertinent patient test results.  I agree with the assessment, diagnosis and plan of care documented in the resident's note.

## 2013-10-22 NOTE — Patient Instructions (Signed)
We would like you to stop using the shampoo for fungus. You do not have fungus or lice. Just use a gentle, mild soap on your hair and the itching should stop.   Come back as scheduled with your doctor. Call us with problems or questions at [phone].  Please bring your medicines with you each time you come.   Medicines may be  Eye drops  Herbal   Vitamins  Pills  Seeing these help [place] take care of you.  Thank you for bringing your medicines today. This helps [place] keep you safe from mistakes!

## 2013-10-25 NOTE — ED Provider Notes (Signed)
Medical screening examination/treatment/procedure(s) were performed by non-physician practitioner and as supervising physician I was immediately available for consultation/collaboration.   EKG Interpretation None       Babette Relic, MD 10/25/13 1003

## 2013-11-13 ENCOUNTER — Other Ambulatory Visit: Payer: Self-pay | Admitting: Internal Medicine

## 2013-11-15 ENCOUNTER — Other Ambulatory Visit: Payer: Self-pay | Admitting: Family Medicine

## 2013-11-15 ENCOUNTER — Other Ambulatory Visit: Payer: Self-pay | Admitting: Internal Medicine

## 2013-11-15 DIAGNOSIS — D242 Benign neoplasm of left breast: Secondary | ICD-10-CM

## 2013-11-17 ENCOUNTER — Other Ambulatory Visit: Payer: Self-pay | Admitting: Internal Medicine

## 2013-11-17 DIAGNOSIS — R7611 Nonspecific reaction to tuberculin skin test without active tuberculosis: Secondary | ICD-10-CM

## 2013-11-18 NOTE — Telephone Encounter (Signed)
Message sent to front office to schedule pt an appt. 

## 2013-11-21 NOTE — Telephone Encounter (Signed)
Review of notes related to latent Tb therapy reveal a planned end date on 10/28/2013.  Therefore, prescription was not renewed.

## 2013-11-27 ENCOUNTER — Encounter (INDEPENDENT_AMBULATORY_CARE_PROVIDER_SITE_OTHER): Payer: Self-pay

## 2013-11-27 ENCOUNTER — Other Ambulatory Visit: Payer: Self-pay | Admitting: Internal Medicine

## 2013-11-27 ENCOUNTER — Ambulatory Visit
Admission: RE | Admit: 2013-11-27 | Discharge: 2013-11-27 | Disposition: A | Discharge status: Home / Self Care | Payer: Medicaid Other | Source: Ambulatory Visit | Attending: Family Medicine | Admitting: Family Medicine

## 2013-11-27 DIAGNOSIS — D242 Benign neoplasm of left breast: Secondary | ICD-10-CM

## 2013-12-06 ENCOUNTER — Encounter: Payer: Self-pay | Admitting: Internal Medicine

## 2013-12-06 ENCOUNTER — Ambulatory Visit (INDEPENDENT_AMBULATORY_CARE_PROVIDER_SITE_OTHER): Payer: Medicaid Other | Admitting: Internal Medicine

## 2013-12-06 VITALS — BP 131/78 | HR 60 | Temp 97.1°F | Wt 128.6 lb

## 2013-12-06 DIAGNOSIS — E1142 Type 2 diabetes mellitus with diabetic polyneuropathy: Secondary | ICD-10-CM

## 2013-12-06 DIAGNOSIS — E1149 Type 2 diabetes mellitus with other diabetic neurological complication: Secondary | ICD-10-CM

## 2013-12-06 DIAGNOSIS — E1140 Type 2 diabetes mellitus with diabetic neuropathy, unspecified: Secondary | ICD-10-CM

## 2013-12-06 DIAGNOSIS — I10 Essential (primary) hypertension: Secondary | ICD-10-CM

## 2013-12-06 DIAGNOSIS — M069 Rheumatoid arthritis, unspecified: Secondary | ICD-10-CM

## 2013-12-06 DIAGNOSIS — R7611 Nonspecific reaction to tuberculin skin test without active tuberculosis: Secondary | ICD-10-CM

## 2013-12-06 LAB — COMPREHENSIVE METABOLIC PANEL
ALT: 67 U/L — ABNORMAL HIGH (ref 0–35)
AST: 253 U/L — ABNORMAL HIGH (ref 0–37)
Albumin: 4.1 g/dL (ref 3.5–5.2)
Alkaline Phosphatase: 76 U/L (ref 39–117)
BUN: 11 mg/dL (ref 6–23)
CO2: 31 mEq/L (ref 19–32)
Calcium: 9.8 mg/dL (ref 8.4–10.5)
Chloride: 98 mEq/L (ref 96–112)
Creat: 0.65 mg/dL (ref 0.50–1.10)
Glucose, Bld: 133 mg/dL — ABNORMAL HIGH (ref 70–99)
Potassium: 3.5 mEq/L (ref 3.5–5.3)
SODIUM: 136 meq/L (ref 135–145)
TOTAL PROTEIN: 7.7 g/dL (ref 6.0–8.3)
Total Bilirubin: 0.4 mg/dL (ref 0.2–1.2)

## 2013-12-06 LAB — LIPID PANEL
Cholesterol: 220 mg/dL — ABNORMAL HIGH (ref 0–200)
HDL: 93 mg/dL (ref >39)
LDL Cholesterol: 103 mg/dL — ABNORMAL HIGH (ref 0–99)
Total CHOL/HDL Ratio: 2.4 Ratio
Triglycerides: 121 mg/dL (ref <150)
VLDL: 24 mg/dL (ref 0–40)

## 2013-12-06 LAB — POCT GLYCOSYLATED HEMOGLOBIN (HGB A1C): HEMOGLOBIN A1C: 6.2

## 2013-12-06 LAB — GLUCOSE, CAPILLARY
GLUCOSE-CAPILLARY: 136 mg/dL — AB (ref 70–99)
GLUCOSE-CAPILLARY: 163 mg/dL — AB (ref 70–99)

## 2013-12-06 NOTE — Assessment & Plan Note (Signed)
Completed latent TB therapy, patient instructed to call [facility] for appt to start enbrel.

## 2013-12-06 NOTE — Assessment & Plan Note (Addendum)
Lab Results  Component Value Date   HGBA1C 6.2 12/06/2013   HGBA1C 6.1 07/30/2013   HGBA1C 5.8 04/03/2013     Assessment: Diabetes control:  controlled Progress toward A1C goal:   at goal Plan: Medications:  diet controlled Home glucose monitoring: Frequency:   Timing:   Instruction/counseling given: reminded to bring blood glucose meter & log to each visit and reminded to bring medications to each visit Educational resources provided: brochure Self management tools provided: copy of home glucose meter download  ADDENDUM: 12/09/13: Patient would benefit from initiation of statin therapy (LDL 103).  Will wait until follow up visit to initiate therapy, as she will be starting enbrel for RA, and would avoid starting multiple medications at once since this is not urgent.

## 2013-12-06 NOTE — Assessment & Plan Note (Signed)
BP Readings from Last 3 Encounters:  12/06/13 131/78  10/22/13 120/79  10/13/13 122/81    Lab Results  Component Value Date   NA 136 09/06/2013   K 3.6 09/06/2013   CREATININE 0.64 09/06/2013    Assessment: Blood pressure control:  controlled Progress toward BP goal:   at goal  Plan: Medications:  continue current medications - lisinopril/hctz 20/25 Educational resources provided: brochure Self management tools provided: home blood pressure logbook Other plans: CMET

## 2013-12-06 NOTE — Patient Instructions (Signed)
-  Please call [facility] since you have completed your latent TB treatment, so you can get started on the Enbrel for your rheumatoid arthritis.    -You may continue to take ibuprofen as needed for pain, but do not exceed 2400mg  in 24 hours.  Please be sure to bring all of your medications with you to every visit.  Should you have any new or worsening symptoms, please be sure to call the clinic at [phone].
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
## 2013-12-06 NOTE — Assessment & Plan Note (Signed)
Completed therapy for latent TB. Monitor LFTs today.  No GI symptoms.

## 2013-12-09 NOTE — Progress Notes (Signed)
Case discussed with Dr. Sharda at the time of the visit.  We reviewed the resident's history and exam and pertinent patient test results.  I agree with the assessment, diagnosis, and plan of care documented in the resident's note.    

## 2013-12-31 ENCOUNTER — Telehealth: Payer: Self-pay | Admitting: Internal Medicine

## 2013-12-31 NOTE — Telephone Encounter (Signed)
Received call from Hermina Barters, RN at Dr. Ginette Otto office at [facility] - they are requesting records as patient is calling for enbrel refill.  I printed notes pertaining to positive PPD (that she completed latent TB tx) and most recent CMET and faxed to [phone].

## 2014-01-02 ENCOUNTER — Encounter: Payer: Self-pay | Admitting: *Deleted

## 2014-01-07 DIAGNOSIS — Z79899 Other long term (current) drug therapy: Secondary | ICD-10-CM | POA: Insufficient documentation

## 2014-01-08 ENCOUNTER — Encounter: Payer: Self-pay | Admitting: Internal Medicine

## 2014-01-15 ENCOUNTER — Encounter: Payer: Self-pay | Admitting: Internal Medicine

## 2014-01-15 ENCOUNTER — Ambulatory Visit (INDEPENDENT_AMBULATORY_CARE_PROVIDER_SITE_OTHER): Payer: Medicaid Other | Admitting: Internal Medicine

## 2014-01-15 VITALS — BP 119/73 | HR 97 | Temp 97.3°F | Wt 127.3 lb

## 2014-01-15 DIAGNOSIS — E1140 Type 2 diabetes mellitus with diabetic neuropathy, unspecified: Secondary | ICD-10-CM

## 2014-01-15 DIAGNOSIS — E1149 Type 2 diabetes mellitus with other diabetic neurological complication: Secondary | ICD-10-CM

## 2014-01-15 DIAGNOSIS — R7402 Elevation of levels of lactic acid dehydrogenase (LDH): Secondary | ICD-10-CM

## 2014-01-15 DIAGNOSIS — R7401 Elevation of levels of liver transaminase levels: Secondary | ICD-10-CM

## 2014-01-15 DIAGNOSIS — R740 Nonspecific elevation of levels of transaminase and lactic acid dehydrogenase [LDH]: Secondary | ICD-10-CM

## 2014-01-15 DIAGNOSIS — E1142 Type 2 diabetes mellitus with diabetic polyneuropathy: Secondary | ICD-10-CM

## 2014-01-15 DIAGNOSIS — B182 Chronic viral hepatitis C: Secondary | ICD-10-CM

## 2014-01-15 DIAGNOSIS — F17200 Nicotine dependence, unspecified, uncomplicated: Secondary | ICD-10-CM

## 2014-01-15 DIAGNOSIS — M069 Rheumatoid arthritis, unspecified: Secondary | ICD-10-CM

## 2014-01-15 DIAGNOSIS — I10 Essential (primary) hypertension: Secondary | ICD-10-CM

## 2014-01-15 LAB — COMPLETE METABOLIC PANEL WITH GFR
ALT: 93 U/L — ABNORMAL HIGH (ref 0–35)
AST: 294 U/L — AB (ref 0–37)
Albumin: 4.2 g/dL (ref 3.5–5.2)
Alkaline Phosphatase: 83 U/L (ref 39–117)
BILIRUBIN TOTAL: 0.4 mg/dL (ref 0.2–1.2)
BUN: 11 mg/dL (ref 6–23)
CO2: 29 mEq/L (ref 19–32)
CREATININE: 0.67 mg/dL (ref 0.50–1.10)
Calcium: 9.5 mg/dL (ref 8.4–10.5)
Chloride: 97 mEq/L (ref 96–112)
GFR, Est Non African American: >89 mL/min
Glucose, Bld: 93 mg/dL (ref 70–99)
Potassium: 3.6 mEq/L (ref 3.5–5.3)
Sodium: 135 mEq/L (ref 135–145)
Total Protein: 7.7 g/dL (ref 6.0–8.3)

## 2014-01-15 NOTE — Patient Instructions (Signed)
We will call you with results of your tests and work on getting you to hepatitis clinic closer to home  Please do not drink alcohol

## 2014-01-16 ENCOUNTER — Encounter: Payer: Self-pay | Admitting: Internal Medicine

## 2014-01-16 NOTE — Assessment & Plan Note (Signed)
BP Readings from Last 3 Encounters:  01/15/14 119/73  12/06/13 131/78  10/22/13 120/79   Lab Results  Component Value Date   NA 135 01/15/2014   K 3.6 01/15/2014   CREATININE 0.67 01/15/2014   Assessment: Blood pressure control: controlled Progress toward BP goal:  at goal  Plan: Medications:  continue current medications prinzide 20-25mg  qd

## 2014-01-16 NOTE — Assessment & Plan Note (Signed)
  Assessment: Progress toward smoking cessation:  smoking the same amount Barriers to progress toward smoking cessation:    Comments: still smoking 1-2 cigs/day  Plan: Instruction/counseling given:  I counseled patient on the dangers of tobacco use, advised patient to stop smoking, and reviewed strategies to maximize success. Educational resources provided:    Self management tools provided:    Medications to assist with smoking cessation:  None Patient agreed to the following self-care plans for smoking cessation:

## 2014-01-16 NOTE — Assessment & Plan Note (Signed)
On enbrel. Followed at [facility]. LFTs trending up.   -referred back to GI per patient by RA -continue follow up at [facility]

## 2014-01-16 NOTE — Assessment & Plan Note (Signed)
Lab Results  Component Value Date   HGBA1C 6.2 12/06/2013   HGBA1C 6.1 07/30/2013   HGBA1C 5.8 04/03/2013    Assessment: Diabetes control: good control (HgbA1C at goal) Progress toward A1C goal:  at goal  Plan: Medications:  on no medicaitons Home glucose monitoring: Frequency:   Timing:   Instruction/counseling given: no instruction/counseling

## 2014-01-16 NOTE — Assessment & Plan Note (Signed)
Was seen at [facility] by Dr. Nicki Reaper in 2013. Appears loss to follow up. Says it is difficult to keep going to [place], prefers office closer.   -will try to refer to [place] hep C clinic -checked viral load today

## 2014-01-16 NOTE — Assessment & Plan Note (Signed)
Continue to trend up likely in setting of chronic untreated Hep C and also on enbrel now. RA recommended for her to re-establish with GI.   -difficult for patient to keep going to [facility] for GI.  -will try hep c clinic referral in [place] vs. Returning back to [facility] with GI there if only options -recheck cmet, LFTs still trending up -completed latent TB treatment -dicussed with pcp
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
## 2014-01-17 LAB — HEPATITIS C RNA QUANTITATIVE
HCV QUANT LOG: 6.23 {Log} — AB (ref <1.18)
HCV Quantitative: 1694712 IU/mL — ABNORMAL HIGH (ref <15)

## 2014-01-20 NOTE — Progress Notes (Signed)
Case discussed with Dr. Qureshi at the time of the visit.  We reviewed the resident's history and exam and pertinent patient test results.  I agree with the assessment, diagnosis, and plan of care documented in the resident's note. 

## 2014-01-21 ENCOUNTER — Other Ambulatory Visit: Payer: Self-pay | Admitting: *Deleted

## 2014-01-21 MED ORDER — NAPROXEN 375 MG PO TABS: 375.0000 mg | ORAL_TABLET | Freq: Two times a day (BID) | ORAL | Status: DC

## 2014-01-30 ENCOUNTER — Other Ambulatory Visit: Payer: Medicaid Other

## 2014-01-30 DIAGNOSIS — B182 Chronic viral hepatitis C: Secondary | ICD-10-CM

## 2014-01-30 LAB — CBC WITH DIFFERENTIAL/PLATELET
Basophils Absolute: 0 10*3/uL (ref 0.0–0.1)
Basophils Relative: 0 % (ref 0–1)
EOS ABS: 0.1 10*3/uL (ref 0.0–0.7)
EOS PCT: 1 % (ref 0–5)
HCT: 43 % (ref 36.0–46.0)
HEMOGLOBIN: 14.8 g/dL (ref 12.0–15.0)
LYMPHS ABS: 2.8 10*3/uL (ref 0.7–4.0)
Lymphocytes Relative: 54 % — ABNORMAL HIGH (ref 12–46)
MCH: 31 pg (ref 26.0–34.0)
MCHC: 34.4 g/dL (ref 30.0–36.0)
MCV: 90.1 fL (ref 78.0–100.0)
MONO ABS: 0.5 10*3/uL (ref 0.1–1.0)
MONOS PCT: 9 % (ref 3–12)
Neutro Abs: 1.9 10*3/uL (ref 1.7–7.7)
Neutrophils Relative %: 36 % — ABNORMAL LOW (ref 43–77)
Platelets: 321 10*3/uL (ref 150–400)
RBC: 4.77 MIL/uL (ref 3.87–5.11)
RDW: 13.5 % (ref 11.5–15.5)
WBC: 5.2 10*3/uL (ref 4.0–10.5)

## 2014-01-30 LAB — IRON: Iron: 102 ug/dL (ref 42–145)

## 2014-01-30 LAB — PROTIME-INR
INR: 1.08 (ref <1.50)
PROTHROMBIN TIME: 14 s (ref 11.6–15.2)

## 2014-01-31 LAB — ANA: Anti Nuclear Antibody(ANA): POSITIVE — AB

## 2014-01-31 LAB — HEPATITIS B SURFACE ANTIGEN: HEP B S AG: NEGATIVE

## 2014-01-31 LAB — ANTI-NUCLEAR AB-TITER (ANA TITER): ANA Titer 1: 1:40 {titer} — ABNORMAL HIGH

## 2014-01-31 LAB — HEPATITIS A ANTIBODY, TOTAL: HEP A TOTAL AB: REACTIVE — AB

## 2014-01-31 LAB — HIV ANTIBODY (ROUTINE TESTING W REFLEX): HIV: NONREACTIVE

## 2014-01-31 LAB — HEPATITIS B CORE ANTIBODY, TOTAL: Hep B Core Total Ab: REACTIVE — AB

## 2014-01-31 LAB — HEPATITIS B SURFACE ANTIBODY,QUALITATIVE: Hep B S Ab: POSITIVE — AB

## 2014-02-02 ENCOUNTER — Other Ambulatory Visit: Payer: Self-pay | Admitting: Internal Medicine

## 2014-02-27 ENCOUNTER — Other Ambulatory Visit: Payer: Self-pay | Admitting: *Deleted

## 2014-02-28 MED ORDER — NAPROXEN 375 MG PO TABS: 375.0000 mg | ORAL_TABLET | Freq: Two times a day (BID) | ORAL | Status: DC

## 2014-03-03 ENCOUNTER — Encounter: Payer: Self-pay | Admitting: Internal Medicine

## 2014-03-03 ENCOUNTER — Ambulatory Visit (INDEPENDENT_AMBULATORY_CARE_PROVIDER_SITE_OTHER): Payer: Medicaid Other | Admitting: Internal Medicine

## 2014-03-03 VITALS — BP 135/79 | HR 54 | Temp 98.3°F | Wt 128.5 lb

## 2014-03-03 DIAGNOSIS — Z1211 Encounter for screening for malignant neoplasm of colon: Secondary | ICD-10-CM

## 2014-03-03 DIAGNOSIS — B182 Chronic viral hepatitis C: Secondary | ICD-10-CM

## 2014-03-03 DIAGNOSIS — B181 Chronic viral hepatitis B without delta-agent: Secondary | ICD-10-CM
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
## 2014-03-05 ENCOUNTER — Telehealth: Payer: Self-pay | Admitting: *Deleted

## 2014-03-05 LAB — HEPATITIS C GENOTYPE

## 2014-03-05 LAB — HEPATITIS B DNA, ULTRAQUANTITATIVE, PCR: Hepatitis B DNA (Calc): <116 copies/mL (ref <116)

## 2014-03-05 NOTE — Telephone Encounter (Signed)
Spoke with patient and she wants to talk with daughter and see when she can come with her. She will call back to schedule after this.

## 2014-03-05 NOTE — Telephone Encounter (Signed)
Message copied by Hulan Saas on Wed Mar 05, 2014  8:20 AM ------      Message from: Lafayette Dragon      Created: Tue Mar 04, 2014 10:00 PM      Regarding: RE: direct EGD/colon       [facility].      ----- Message -----         From: Hulan Saas, RN         Sent: 03/04/2014   8:34 AM           To: Lafayette Dragon, MD      Subject: RE: direct EGD/colon                                     Dr. Olevia Perches,      Do you want this scheduled at the hospital or [place]?      Rollene Fare      ----- Message -----         From: Lafayette Dragon, MD         Sent: 03/04/2014   8:08 AM           To: Hulan Saas, RN, Carlyle Basques, MD      Subject: direct EGD/colon                                         Rollene Fare, could you, please, set up this pt for EGD/colon, r/o es. Varices and colorectal screening. Thank you. DB      ----- Message -----         From: Carlyle Basques, MD         Sent: 03/03/2014   9:52 PM           To: Lafayette Dragon, MD            Towanda Malkin,      Sure. I will place the orders in epic to get her egd and colonoscopy, an let your offices know.            ----- Message -----         From: Lafayette Dragon, MD         Sent: 03/03/2014   5:17 PM           To: Hulan Saas, RN, Carlyle Basques, MD            Hello Caren Griffins. I will definitely be able to see her. Do you think direct EGD/colon would be sufficient since she already sees you and the Hep clinic? Last time I just did a direct  screening colonoscopy which was normal and she is due for 5 year recall this August.? Direct may be more efficient. Let me know. Thanx Dora brodie      ----- Message -----         From: Carlyle Basques, MD         Sent: 03/03/2014   4:32 PM           To: Lafayette Dragon, MD            Hi Dr. Olevia Perches, I am seeing Ms. Musson for treatment of Hep C. Is there a chance you would be able to see Ms. 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  And colonoscopy since she has family hx of colon ca and up for repeat screening in 03/2014.             Thanks for your help,cynthia                               ------

## 2014-03-06 ENCOUNTER — Ambulatory Visit (HOSPITAL_COMMUNITY)
Admission: RE | Admit: 2014-03-06 | Discharge: 2014-03-06 | Disposition: A | Discharge status: Home / Self Care | Payer: Medicaid Other | Source: Ambulatory Visit | Attending: Internal Medicine | Admitting: Internal Medicine

## 2014-03-06 DIAGNOSIS — B182 Chronic viral hepatitis C: Secondary | ICD-10-CM | POA: Diagnosis present

## 2014-03-09 ENCOUNTER — Other Ambulatory Visit: Payer: Self-pay | Admitting: Internal Medicine

## 2014-03-09 DIAGNOSIS — B182 Chronic viral hepatitis C: Secondary | ICD-10-CM

## 2014-03-11 ENCOUNTER — Encounter: Payer: Self-pay | Admitting: Internal Medicine

## 2014-03-11 ENCOUNTER — Ambulatory Visit (INDEPENDENT_AMBULATORY_CARE_PROVIDER_SITE_OTHER): Payer: Medicaid Other | Admitting: Internal Medicine

## 2014-03-11 ENCOUNTER — Ambulatory Visit (HOSPITAL_COMMUNITY)
Admission: RE | Admit: 2014-03-11 | Discharge: 2014-03-11 | Disposition: A | Discharge status: Home / Self Care | Payer: Medicaid Other | Source: Ambulatory Visit | Attending: Otolaryngology | Admitting: Otolaryngology

## 2014-03-11 VITALS — BP 111/76 | HR 59 | Temp 98.2°F | Wt 126.9 lb

## 2014-03-11 DIAGNOSIS — B3731 Acute candidiasis of vulva and vagina: Secondary | ICD-10-CM

## 2014-03-11 DIAGNOSIS — R079 Chest pain, unspecified: Secondary | ICD-10-CM | POA: Diagnosis present

## 2014-03-11 DIAGNOSIS — E1142 Type 2 diabetes mellitus with diabetic polyneuropathy: Secondary | ICD-10-CM

## 2014-03-11 DIAGNOSIS — E1149 Type 2 diabetes mellitus with other diabetic neurological complication: Secondary | ICD-10-CM

## 2014-03-11 DIAGNOSIS — K2100 Gastro-esophageal reflux disease with esophagitis, without bleeding: Secondary | ICD-10-CM

## 2014-03-11 DIAGNOSIS — E1140 Type 2 diabetes mellitus with diabetic neuropathy, unspecified: Secondary | ICD-10-CM

## 2014-03-11 DIAGNOSIS — B373 Candidiasis of vulva and vagina: Secondary | ICD-10-CM

## 2014-03-11 HISTORY — DX: Candidiasis of vulva and vagina: B37.3

## 2014-03-11 HISTORY — DX: Acute candidiasis of vulva and vagina: B37.31

## 2014-03-11 LAB — POCT GLYCOSYLATED HEMOGLOBIN (HGB A1C): Hemoglobin A1C: 6.2

## 2014-03-11 LAB — GLUCOSE, CAPILLARY: Glucose-Capillary: 111 mg/dL — ABNORMAL HIGH (ref 70–99)

## 2014-03-11 MED ORDER — GLUCOSE BLOOD VI STRP: ORAL_STRIP | Status: DC

## 2014-03-11 MED ORDER — FLUCONAZOLE 150 MG PO TABS: 150.0000 mg | ORAL_TABLET | Freq: Every day | ORAL | Status: DC

## 2014-03-11 MED ORDER — OMEPRAZOLE 20 MG PO CPDR
20.0000 mg | DELAYED_RELEASE_CAPSULE | Freq: Every day | ORAL | Status: DC
Start: 2014-03-11 — End: 2014-05-20

## 2014-03-11 NOTE — Assessment & Plan Note (Addendum)
GERD vs Esophagitis- Pt reports burning sensation under sternum along w/ night time coughing, morning voice hoarseness, feeling bloated, and metallic taste in mouth. She has been on prilosec in the past but cannot remember if it worked. She also describes burning sensation in throat when eating and drinking. Denies difficulty swallowing or food getting stuck in throat. She is on naproxen which can cause esophagitis but does not go to sleep right after she takes med and takes med with meals and drinks a glass of water afterwards.   Will start on prilosec 20mg  once a day for GERD symptoms that may be causing patient chest pain she reported during visit with Dr. Baxter Flattery. She is also scheduled to have an EGD with [facility] to r/o varices 2/2 F1 cirrhosis in the setting of Hep C. EKG ordered in clinic today revealed sinus bradycardia - HR 53, no ST elevation or Q waves, normal R wave progression, no LVH.

## 2014-03-11 NOTE — Progress Notes (Signed)
Subjective:     Patient ID: Lorraine [name], female   DOB: [birth year], 53 y.o.   MRN: [id number]  Diabetes Pertinent negatives for diabetes include no chest pain.   Pt is a 53 y/o female w/ PMHx of HTN, DM2, RA, and Hep C who presents for DM f/u and acid reflux. Pt is not on any medications for diabetes and her A1c in clinic today was 6.2. She has only checked her blood glucose once this past month per glucometer report. Pt was complaining of chest pain during her clinic visit with Dr. Baxter Flattery who she now follows for her Hep C. Today in clinic patient denies chest pain. She does endorse symptoms of GERD including feeling bloated, metallic taste in mouth, and burning sensation under sternum up to mid clavicles. She also describes burning sensation in throat when drinking and eating. She takes naproxen for RA pain once in the morning and once in the evening around 5 o'clock with meals. She denies laying down after taking naproxen and does drink a glass of water after taking med.   She also notes that she is having white vaginal discharge with no foul odor that is associated with itching and buring. She has hx of yeast infxn and reports having the same symptoms as previous yeast infxns.   Review of Systems  HENT:       Voice hoarseness in the morning, burning sensation when eating/drinking, night time coughing   Cardiovascular: Negative for chest pain.  Genitourinary: Negative for dysuria.       Vaginal itching and burning with white discharge   Musculoskeletal: Positive for joint swelling.       Objective:   Physical Exam  Constitutional: She appears well-developed and well-nourished.  HENT:  Mouth/Throat: No oropharyngeal exudate.  Mild erythema noted in back of throat    Eyes: Pupils are equal, round, and reactive to light.  Cardiovascular: Normal rate and regular rhythm.   No murmur heard. Pulmonary/Chest: Effort normal and breath sounds normal. She has no wheezes.  Abdominal: Soft.  Bowel sounds are normal. There is no tenderness.  Musculoskeletal:  Nodules on 3rd and 4th left toes       Assessment:     Please see problem based assessment and plan    Plan:       Please see problem based assessment and plan

## 2014-03-11 NOTE — Assessment & Plan Note (Addendum)
Pt is not on any DM medications. A1c today was 6.2 at goal. She brought her glucometer in today and report revealed only once blood sugar reading. She is currently on an ACEinh with last microalbumin WNL in 2013.   She was instructed to check her blood sugars at least once a day and bring her glucometer with her to next appointment. Will get urine microalbumin today and consider adjusting her ACEinh as needed.

## 2014-03-11 NOTE — Patient Instructions (Signed)
It was nice meeting you today. Take diflucan 150mg  once for your yeast infection. If it does not get better within one week you will need to call the clinic to be seen for a pelvic exam. You are also due for a pelvic exam at your next follow up appointment. Start taking prilosec 20mg  once a day for acid reflux. Please make sure to schedule an appointment with gastroenterology for an upper endoscopy and colonoscopy. Make sure to check your blood sugars at least once a day and bring your glucometer to your next visit.    Gastroesophageal Reflux Disease, Adult Gastroesophageal reflux disease (GERD) happens when acid from your stomach goes into your food pipe (esophagus). The acid can cause a burning feeling in your chest. Over time, the acid can make small holes (ulcers) in your food pipe.  HOME CARE  Ask your doctor for advice about:  Losing weight.  Quitting smoking.  Alcohol use.  Avoid foods and drinks that make your problems worse. You may want to avoid:  Caffeine and alcohol.  Chocolate.  Mints.  Garlic and onions.  Spicy foods.  Citrus fruits, such as oranges, lemons, or limes.  Foods that contain tomato, such as sauce, chili, salsa, and pizza.  Fried and fatty foods.  Avoid lying down for 3 hours before you go to bed or before you take a nap.  Eat small meals often, instead of large meals.  Wear loose-fitting clothing. Do not wear anything tight around your waist.  Raise (elevate) the head of your bed 6 to 8 inches with wood blocks. Using extra pillows does not help.  Only take medicines as told by your doctor.  Do not take aspirin or ibuprofen. GET HELP RIGHT AWAY IF:   You have pain in your arms, neck, jaw, teeth, or back.  Your pain gets worse or changes.  You feel sick to your stomach (nauseous), throw up (vomit), or sweat (diaphoresis).  You feel short of breath, or you pass out (faint).  Your throw up is green, yellow, black, or looks like coffee  grounds or blood.  Your poop (stool) is red, bloody, or black. MAKE SURE YOU:   Understand these instructions.  Will watch your condition.  Will get help right away if you are not doing well or get worse. Document Released: 01/11/2008 Document Revised: 10/17/2011 Document Reviewed: 02/11/2011 [facility] Patient Information 2015 [place], [place]. This information is not intended to replace advice given to you by your health care provider. Make sure you discuss any questions you have with your health care provider.

## 2014-03-12 LAB — MICROALBUMIN / CREATININE URINE RATIO
Creatinine, Urine: 190.5 mg/dL
Microalb Creat Ratio: 9.7 mg/g (ref 0.0–30.0)
Microalb, Ur: 1.85 mg/dL (ref 0.00–1.89)

## 2014-03-12 NOTE — Progress Notes (Signed)
INTERNAL MEDICINE TEACHING ATTENDING ADDENDUM - Aldine Contes, MD: I personally saw and evaluated Ms. West in this clinic visit in conjunction with the resident, Dr. Hulen Luster. I have discussed patient's plan of care with medical resident during this visit. I have confirmed the physical exam findings and have read and agree with the clinic note including the plan with the following addition: - Patient here for routine follow up - On exam: Cardio- RRR, normal heart sounds, Lungs- CTA b/l - Will start prilosec for likely GERD.  - Patient to follow up with GI for EGD to rule out varices given history of cirrhosis - DM well controlled off meds. Will monitor

## 2014-03-14 ENCOUNTER — Encounter: Payer: Self-pay | Admitting: *Deleted

## 2014-03-28 ENCOUNTER — Encounter (HOSPITAL_COMMUNITY): Payer: Self-pay | Admitting: Emergency Medicine

## 2014-03-28 ENCOUNTER — Emergency Department (HOSPITAL_COMMUNITY)
Admission: EM | Admit: 2014-03-28 | Discharge: 2014-03-28 | Disposition: A | Discharge status: Home / Self Care | Payer: Medicaid Other | Attending: Emergency Medicine | Admitting: Emergency Medicine

## 2014-03-28 DIAGNOSIS — M1990 Unspecified osteoarthritis, unspecified site: Secondary | ICD-10-CM | POA: Diagnosis not present

## 2014-03-28 DIAGNOSIS — Z8742 Personal history of other diseases of the female genital tract: Secondary | ICD-10-CM | POA: Diagnosis not present

## 2014-03-28 DIAGNOSIS — Z791 Long term (current) use of non-steroidal anti-inflammatories (NSAID): Secondary | ICD-10-CM | POA: Insufficient documentation

## 2014-03-28 DIAGNOSIS — E119 Type 2 diabetes mellitus without complications: Secondary | ICD-10-CM | POA: Insufficient documentation

## 2014-03-28 DIAGNOSIS — M79609 Pain in unspecified limb: Secondary | ICD-10-CM | POA: Diagnosis present

## 2014-03-28 DIAGNOSIS — G5790 Unspecified mononeuropathy of unspecified lower limb: Secondary | ICD-10-CM | POA: Diagnosis not present

## 2014-03-28 DIAGNOSIS — Z8611 Personal history of tuberculosis: Secondary | ICD-10-CM | POA: Diagnosis not present

## 2014-03-28 DIAGNOSIS — G8929 Other chronic pain: Secondary | ICD-10-CM | POA: Insufficient documentation

## 2014-03-28 DIAGNOSIS — Z8619 Personal history of other infectious and parasitic diseases: Secondary | ICD-10-CM | POA: Insufficient documentation

## 2014-03-28 DIAGNOSIS — K219 Gastro-esophageal reflux disease without esophagitis: Secondary | ICD-10-CM | POA: Insufficient documentation

## 2014-03-28 DIAGNOSIS — F17200 Nicotine dependence, unspecified, uncomplicated: Secondary | ICD-10-CM | POA: Insufficient documentation

## 2014-03-28 DIAGNOSIS — Z79899 Other long term (current) drug therapy: Secondary | ICD-10-CM | POA: Insufficient documentation

## 2014-03-28 DIAGNOSIS — I10 Essential (primary) hypertension: Secondary | ICD-10-CM | POA: Insufficient documentation

## 2014-03-28 DIAGNOSIS — M792 Neuralgia and neuritis, unspecified: Secondary | ICD-10-CM

## 2014-03-28 NOTE — ED Provider Notes (Signed)
Medical screening examination/treatment/procedure(s) were performed by non-physician practitioner and as supervising physician I was immediately available for consultation/collaboration.   EKG Interpretation None        Wandra Arthurs, MD 03/28/14 [id number]

## 2014-03-28 NOTE — Discharge Instructions (Signed)

## 2014-03-28 NOTE — ED Notes (Addendum)
Pt reports bilateral foot pain for over a year. Has been receiving treatment, naproxen, enbrel. Sts today pain is worse and she "can't take it anymore." Reports bilateral feet going numb today which is new. Pt is a diabetic and does not recall if she has ever been diagnosed with neuropathy.
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
## 2014-03-30 ENCOUNTER — Other Ambulatory Visit: Payer: Self-pay | Admitting: Internal Medicine

## 2014-03-31 ENCOUNTER — Ambulatory Visit (INDEPENDENT_AMBULATORY_CARE_PROVIDER_SITE_OTHER): Payer: Medicaid Other | Admitting: Internal Medicine

## 2014-03-31 DIAGNOSIS — E1149 Type 2 diabetes mellitus with other diabetic neurological complication: Secondary | ICD-10-CM

## 2014-03-31 DIAGNOSIS — E1142 Type 2 diabetes mellitus with diabetic polyneuropathy: Secondary | ICD-10-CM

## 2014-03-31 DIAGNOSIS — E1140 Type 2 diabetes mellitus with diabetic neuropathy, unspecified: Secondary | ICD-10-CM

## 2014-03-31 DIAGNOSIS — E785 Hyperlipidemia, unspecified: Secondary | ICD-10-CM | POA: Insufficient documentation

## 2014-03-31 DIAGNOSIS — M069 Rheumatoid arthritis, unspecified: Secondary | ICD-10-CM

## 2014-03-31 LAB — GLUCOSE, CAPILLARY: Glucose-Capillary: 94 mg/dL (ref 70–99)

## 2014-03-31 MED ORDER — PRAVASTATIN SODIUM 40 MG PO TABS: 40.0000 mg | ORAL_TABLET | Freq: Every evening | ORAL | Status: DC

## 2014-03-31 MED ORDER — PREGABALIN 50 MG PO CAPS: ORAL_CAPSULE | ORAL | Status: DC

## 2014-03-31 NOTE — Patient Instructions (Addendum)
Start taking lyrica 50mg  three times a day for one week, then increase dose to 100mg  (2 tabs) twice a day for one week, then take 100mg  three times a day. Call our office if you are unable to afford lyrica so that we can prescribe you gabapentin.   Start taking pravastatin 40 mg once a day for your cholesterol.   Make sure to call your rheumatologist Dr. Pennie Banter for a follow up appointment at:  Rheumatology - [facility] [place], [place] [address]  [phone]

## 2014-03-31 NOTE — Progress Notes (Signed)
Subjective:     Patient ID: Lorraine [name], female   DOB: [birth year], 53 y.o.   MRN: [id number]  HPI Pt is a 53 y/o female w/ PMHx of HTN, hep C, GERD, DM2, and RA who presents to clinic today for ED f/u. She was last seen in the ED on 8/21 for b/l foot pain and d/c'd the same day w/dx of neuropathic foot pain. She is still complaining of the same pain she had when she went to the ED. She states pain is a burning sensation and occasionally causes her feet to go numb. She points to the knuckles of her toes which hurt her the most. Having bed sheets touch her toe knuckles causes her pain and therefore she hangs her feet off the bed for relief of pain. She states that pain is 10/10 in severity and pain is not relieved w/ ibuprofen or naproxen. She sees Dr. Pennie Banter for her rheumatoid arthritis and he prescribed her enabrel injections, which she believes does not help her as much anymore. She has also tried gabapentin for neuropathic pain w/o much relief. Current foot pain is chronic and has been going on for the past year. She also reports cramping of calf muscles. She smokes 2 cigarettes per day .  DM: Pt brought her glucometer in today w/ only 3 tests which range from 110-129. Her last HbA1c on 8/4 was 6.2 at goal. She is not on any DM medications.    Review of Systems  Constitutional: Negative for activity change.  Musculoskeletal: Positive for arthralgias and joint swelling.       B/l foot pain   Neurological: Negative for weakness.       Objective:   Physical Exam  Constitutional: She appears well-developed and well-nourished.  Cardiovascular: Normal rate and regular rhythm.   Pulmonary/Chest: Effort normal and breath sounds normal.  Abdominal: Soft. Bowel sounds are normal.  Musculoskeletal:  Intact sensation in feet, normal LE strength, no apparent joint deformities on feet, no erythema, no open wounds or bruises, tender to palpation of toe joints.        Assessment:     Please see  problem based assessment and plan.        Plan:     Please see problem based assessment and plan.

## 2014-04-03 ENCOUNTER — Other Ambulatory Visit: Payer: Self-pay | Admitting: *Deleted

## 2014-04-03 ENCOUNTER — Ambulatory Visit (INDEPENDENT_AMBULATORY_CARE_PROVIDER_SITE_OTHER): Payer: Medicaid Other | Admitting: Internal Medicine

## 2014-04-03 ENCOUNTER — Encounter: Payer: Self-pay | Admitting: Internal Medicine

## 2014-04-03 VITALS — BP 112/75 | HR 51 | Temp 98.6°F | Wt 129.0 lb

## 2014-04-03 DIAGNOSIS — B182 Chronic viral hepatitis C: Secondary | ICD-10-CM

## 2014-04-03 DIAGNOSIS — Z23 Encounter for immunization: Secondary | ICD-10-CM | POA: Diagnosis present

## 2014-04-03 NOTE — Assessment & Plan Note (Signed)
Last lipid panel on 12/06/13 revealed LDL of 103. She has been on pravastatin in the past but no longer taking it.   - rx for pravastatin 40mg  daily

## 2014-04-03 NOTE — Assessment & Plan Note (Signed)
Pt follows w/ Dr. Pennie Banter for her RA. Takes weekly enabrel injections which she believes do not help her much anymore. She has been on gabapentin w/o much relief in pain.   - will try lyrica 50mg  TID and then titrate up to 100mg  TID, if insurance does not pay for this medication will retry gabapentin  - ordered ABI's as pt has long smoking hx, DM, and LDL of 103 - instructed pt to schedule f/u appt with Dr. Pennie Banter
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
## 2014-04-03 NOTE — Assessment & Plan Note (Signed)
Lab Results  Component Value Date   HGBA1C 6.2 03/11/2014   HGBA1C 6.2 12/06/2013   HGBA1C 6.1 07/30/2013     Assessment: Diabetes control:  controlled Progress toward A1C goal:  Pt is not on any DM medicatins  Plan: Advised patient to check her blood sugars daily, will repeat A1c in 3 months.

## 2014-04-05 MED ORDER — NAPROXEN 375 MG PO TABS: 375.0000 mg | ORAL_TABLET | Freq: Two times a day (BID) | ORAL | Status: DC

## 2014-04-07 ENCOUNTER — Telehealth: Payer: Self-pay | Admitting: *Deleted

## 2014-04-07 NOTE — Telephone Encounter (Signed)
Message copied by Hulan Saas on Mon Apr 07, 2014 11:22 AM ------      Message from: Hulan Saas      Created: Mon Mar 24, 2014  1:29 PM       Did patient schedule ECOL with DB ------

## 2014-04-07 NOTE — Telephone Encounter (Signed)
Spoke with patient and she scheduled ECOL on 05/30/14 at 1:30 PM at [facility] as per Dr. Sela Hilding could not come before this date due to transportation issues) and pre visit on 05/20/14 at 2:30 PM.

## 2014-04-08 ENCOUNTER — Encounter: Payer: Self-pay | Admitting: Internal Medicine

## 2014-04-08 NOTE — Progress Notes (Signed)
Internal Medicine Clinic Attending Date of visit: 03/31/2014  I saw and evaluated the patient.  I personally confirmed the key portions of the history and exam documented by Dr. Hulen Luster and I reviewed pertinent patient test results.  The assessment, diagnosis, and plan were formulated together and I agree with the documentation in the resident's note.

## 2014-04-09 ENCOUNTER — Telehealth: Payer: Self-pay | Admitting: *Deleted

## 2014-04-09 NOTE — Telephone Encounter (Signed)
Received PA request from pt's pharmacy for lyrica-medication is non-preferred with medicaid. Pt has been on gabapentin and clonazepam in the past, so the lyrica was approved 04/09/2013-04/04/15. Will inform pt and pharmacy.[name]9/2/201510:58 AM  Conf# [id number] Medicaid ID# [id number]   (Length of call 17 minutes)

## 2014-05-08 ENCOUNTER — Other Ambulatory Visit: Payer: Self-pay

## 2014-05-08 DIAGNOSIS — Z1239 Encounter for other screening for malignant neoplasm of breast: Secondary | ICD-10-CM

## 2014-05-20 ENCOUNTER — Ambulatory Visit: Payer: Medicaid Other | Admitting: Internal Medicine

## 2014-05-20 ENCOUNTER — Ambulatory Visit (AMBULATORY_SURGERY_CENTER): Payer: Self-pay | Admitting: *Deleted

## 2014-05-20 ENCOUNTER — Encounter: Payer: Self-pay | Admitting: *Deleted

## 2014-05-20 VITALS — Ht 63.0 in | Wt 133.6 lb

## 2014-05-20 DIAGNOSIS — I8500 Esophageal varices without bleeding: Secondary | ICD-10-CM

## 2014-05-20 DIAGNOSIS — Z800 Family history of malignant neoplasm of digestive organs: Secondary | ICD-10-CM

## 2014-05-20 MED ORDER — MOVIPREP 100 G PO SOLR: 1.0000 | Freq: Once | ORAL | Status: DC

## 2014-05-20 NOTE — Progress Notes (Signed)
No egg or soy allergy. ewm  no home 02 use. ewm No problems with pst sedation. ewm No cpap use. ewm No blood thinners. ewm No diet pills. ewm Pt doesn't have e mail for emmi video. ewm

## 2014-05-27 ENCOUNTER — Other Ambulatory Visit: Payer: Self-pay

## 2014-05-27 DIAGNOSIS — Z1231 Encounter for screening mammogram for malignant neoplasm of breast: Secondary | ICD-10-CM

## 2014-05-29 ENCOUNTER — Ambulatory Visit
Admission: RE | Admit: 2014-05-29 | Discharge: 2014-05-29 | Disposition: A | Discharge status: Home / Self Care | Payer: Medicaid Other | Source: Ambulatory Visit

## 2014-05-29 DIAGNOSIS — Z1231 Encounter for screening mammogram for malignant neoplasm of breast: Secondary | ICD-10-CM

## 2014-05-30 ENCOUNTER — Encounter: Payer: Self-pay | Admitting: Internal Medicine

## 2014-05-30 ENCOUNTER — Encounter: Payer: Medicaid Other | Admitting: Internal Medicine

## 2014-05-30 ENCOUNTER — Ambulatory Visit (AMBULATORY_SURGERY_CENTER): Payer: Medicaid Other | Admitting: Internal Medicine

## 2014-05-30 VITALS — BP 124/77 | HR 62 | Temp 97.7°F | Resp 24 | Ht 63.0 in | Wt 133.0 lb

## 2014-05-30 DIAGNOSIS — Z1211 Encounter for screening for malignant neoplasm of colon: Secondary | ICD-10-CM

## 2014-05-30 DIAGNOSIS — Z800 Family history of malignant neoplasm of digestive organs: Secondary | ICD-10-CM

## 2014-05-30 DIAGNOSIS — I8500 Esophageal varices without bleeding: Secondary | ICD-10-CM

## 2014-05-30 LAB — GLUCOSE, CAPILLARY
Glucose-Capillary: 73 mg/dL (ref 70–99)
Glucose-Capillary: 53 mg/dL — ABNORMAL LOW (ref 70–99)
Glucose-Capillary: 88 mg/dL (ref 70–99)

## 2014-05-30 MED ORDER — SODIUM CHLORIDE 0.9 % IV SOLN: 500.0000 mL | INTRAVENOUS | Status: DC

## 2014-05-30 NOTE — Op Note (Signed)
[name]. [place], [id number]   COLONOSCOPY PROCEDURE REPORT  PATIENT: Lorraine [name], Lorraine [name]  MR#: [id number] BIRTHDATE: 06-Jan-1961 , 89  yrs. old GENDER: female ENDOSCOPIST: Lafayette Dragon, MD REFERRED BY:Dr Carlyle Basques, Dr D.Truong PROCEDURE DATE:  05/30/2014 PROCEDURE:   Colonoscopy, screening First Screening Colonoscopy - Avg.  risk and is 50 yrs.  old or older - No.  Prior Negative Screening - Now for repeat screening. Less than 10 yrs Prior Negative Screening - Now for repeat screening.  Above average risk  History of Adenoma - Now for follow-up colonoscopy & has been > or = to 3 yrs.  N/A  Polyps Removed Today? No.  Polyps Removed Today? No.  Recommend repeat exam, <10 yrs? Polyps Removed Today? No.  Recommend repeat exam, <10 yrs? Yes.  Polyps Removed Today? No.  Recommend repeat exam, <10 yrs? Yes.  High risk (family or personal hx). ASA CLASS:   Class II INDICATIONS:father had colon cancer.  The last colonoscopy August 2010 was normal. MEDICATIONS: Monitored anesthesia care and Propofol 150 mg IV  DESCRIPTION OF PROCEDURE:   After the risks benefits and alternatives of the procedure were thoroughly explained, informed consent was obtained.  The digital rectal exam revealed no abnormalities of the rectum.   The LB [id number]  endoscope was introduced through the anus and advanced to the cecum, which was identified by both the appendix and ileocecal valve. No adverse events experienced.   The quality of the prep was good, using MoviPrep  The instrument was then slowly withdrawn as the colon was fully examined.      COLON FINDINGS: A normal appearing cecum, ileocecal valve, and appendiceal orifice were identified.  The ascending, transverse, descending, sigmoid colon, and rectum appeared unremarkable. Retroflexed views revealed no abnormalities. The time to cecum=8 minutes 25 seconds.  Withdrawal time=6 minutes 05 seconds.   The scope was withdrawn and the procedure completed. COMPLICATIONS: There were no immediate complications.  ENDOSCOPIC IMPRESSION: Normal colonoscopy ,no evidence of portal hypertension  RECOMMENDATIONS: High fiber diet Recall colonoscopy in 5 years  eSigned:  Lafayette Dragon, MD 05/30/2014 2:22 PM   cc:

## 2014-05-30 NOTE — Patient Instructions (Signed)
YOU HAD AN ENDOSCOPIC PROCEDURE TODAY AT THE [facility]: Refer to the procedure report that was given to you for any specific questions about what was found during the examination.  If the procedure report does not answer your questions, please call your gastroenterologist to clarify.  If you requested that your care partner not be given the details of your procedure findings, then the procedure report has been included in a sealed envelope for you to review at your convenience later.  YOU SHOULD EXPECT: Some feelings of bloating in the abdomen. Passage of more gas than usual.  Walking can help get rid of the air that was put into your GI tract during the procedure and reduce the bloating. If you had a lower endoscopy (such as a colonoscopy or flexible sigmoidoscopy) you may notice spotting of blood in your stool or on the toilet paper. If you underwent a bowel prep for your procedure, then you may not have a normal bowel movement for a few days.  DIET: Your first meal following the procedure should be a light meal and then it is ok to progress to your normal diet.  A half-sandwich or bowl of soup is an example of a good first meal.  Heavy or fried foods are harder to digest and may make you feel nauseous or bloated.  Likewise meals heavy in dairy and vegetables can cause extra gas to form and this can also increase the bloating.  Drink plenty of fluids but you should avoid alcoholic beverages for 24 hours.  ACTIVITY: Your care partner should take you home directly after the procedure.  You should plan to take it easy, moving slowly for the rest of the day.  You can resume normal activity the day after the procedure however you should NOT DRIVE or use heavy machinery for 24 hours (because of the sedation medicines used during the test).    SYMPTOMS TO REPORT IMMEDIATELY: A gastroenterologist can be reached at any hour.  During normal business hours, 8:30 AM to 5:00 PM Monday through Friday,  call [phone].  After hours and on weekends, please call the GI answering service at [phone] who will take a message and have the physician on call contact you.   Following lower endoscopy (colonoscopy or flexible sigmoidoscopy):  Excessive amounts of blood in the stool  Significant tenderness or worsening of abdominal pains  Swelling of the abdomen that is new, acute  Fever of 100F or higher  Following upper endoscopy (EGD)  Vomiting of blood or coffee ground material  New chest pain or pain under the shoulder blades  Painful or persistently difficult swallowing  New shortness of breath  Fever of 100F or higher  Black, tarry-looking stools  FOLLOW UP: If any biopsies were taken you will be contacted by phone or by letter within the next 1-3 weeks.  Call your gastroenterologist if you have not heard about the biopsies in 3 weeks.  Our staff will call the home number listed on your records the next business day following your procedure to check on you and address any questions or concerns that you may have at that time regarding the information given to you following your procedure. This is a courtesy call and so if there is no answer at the home number and we have not heard from you through the emergency physician on call, we will assume that you have returned to your regular daily activities without incident.  SIGNATURES/CONFIDENTIALITY: You and/or your care   partner have signed paperwork which will be entered into your electronic medical record.  These signatures attest to the fact that that the information above on your After Visit Summary has been reviewed and is understood.  Full responsibility of the confidentiality of this discharge information lies with you and/or your care-partner.  High fiber diet information given.  Dr. Olevia Perches suggests endoscopy in 2-3 years.,  Follow-up in a hepatitis clinic.

## 2014-05-30 NOTE — Progress Notes (Signed)
accu check- 73.  No medications taken for diabetes and pt denies any symptoms of hypoglycemia

## 2014-05-30 NOTE — Op Note (Signed)
[place]  [address]. [place], [address]   ENDOSCOPY PROCEDURE REPORT  PATIENT: Lorraine, [name]  MR#: [id number] BIRTHDATE: Jul 05, 1961 , 46  yrs. old GENDER: female ENDOSCOPIST: Lafayette Dragon, MD REFERRED BY:  Dr Carlyle Basques, Dr Julious Oka PROCEDURE DATE:  05/30/2014 PROCEDURE:  EGD, diagnostic and EGD, screening  to r/o varices ASA CLASS:     Class II INDICATIONS:  screening for varices. MEDICATIONS: Monitored anesthesia care and Propofol 250 mg IV TOPICAL ANESTHETIC: none  DESCRIPTION OF PROCEDURE: After the risks benefits and alternatives of the procedure were thoroughly explained, informed consent was obtained.  The LB [id number] endoscope was introduced through the mouth and advanced to the second portion of the duodenum , Without limitations.  The instrument was slowly withdrawn as the mucosa was fully examined.    Esophagus: esophageal mucosa was normal in the proximal, mid and distal esophagus. There were very slight varices in distal half of the esophagus at 5:00 and 7:00 there were grade 1 esophageal varices without stigmata of bleeding. Squamocolumnar junction was normal Stomach: the gastric mucosa was essentially normal. There was no evidence of gastropathy or portal hypertension. Gastric outlet was normal. Retroflexion of the endoscope reveals normal fundus and cardia without evidence of gastric varices Duodenum:  duodenal bulb and descending duodenum was normal[ The scope was then withdrawn from the patient and the procedure completed.  COMPLICATIONS: There were no immediate complications.  ENDOSCOC IMPRESSION:   1. 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 no evidence of portal hypertensive gastropathy 3. no evidence of gastric varices  RECOMMENDATIONS: Followup in a hepatitis clinic ssuggest surveillance endoscopy in 2 or 3 years  REPEAT EXAM: 203 years  eSigned:  Lafayette Dragon, MD 05/30/2014 2:18  PM    CC:  PATIENT NAME:  Lorraine, [name] MR#: [id number]

## 2014-05-30 NOTE — Progress Notes (Signed)
Procedure ends, to recovery, report given and VSS. 

## 2014-06-02 ENCOUNTER — Telehealth: Payer: Self-pay | Admitting: *Deleted

## 2014-06-02 NOTE — Telephone Encounter (Signed)
No answer, number identifier. Message left to call if any questions or concerns.

## 2014-06-09 ENCOUNTER — Encounter: Payer: Self-pay | Admitting: Internal Medicine

## 2014-06-16 ENCOUNTER — Other Ambulatory Visit: Payer: Self-pay | Admitting: *Deleted

## 2014-06-17 MED ORDER — LISINOPRIL-HYDROCHLOROTHIAZIDE 20-25 MG PO TABS: 1.0000 | ORAL_TABLET | Freq: Every day | ORAL | Status: DC

## 2014-06-18 ENCOUNTER — Ambulatory Visit (INDEPENDENT_AMBULATORY_CARE_PROVIDER_SITE_OTHER): Payer: Medicaid Other | Admitting: Internal Medicine

## 2014-06-18 ENCOUNTER — Encounter: Payer: Self-pay | Admitting: Internal Medicine

## 2014-06-18 VITALS — BP 138/78 | HR 60 | Temp 98.2°F | Ht 63.0 in | Wt 135.5 lb

## 2014-06-18 DIAGNOSIS — L989 Disorder of the skin and subcutaneous tissue, unspecified: Secondary | ICD-10-CM

## 2014-06-18 DIAGNOSIS — M069 Rheumatoid arthritis, unspecified: Secondary | ICD-10-CM

## 2014-06-18 DIAGNOSIS — R238 Other skin changes: Secondary | ICD-10-CM | POA: Insufficient documentation

## 2014-06-18 DIAGNOSIS — E119 Type 2 diabetes mellitus without complications: Secondary | ICD-10-CM

## 2014-06-18 LAB — GLUCOSE, CAPILLARY: Glucose-Capillary: 86 mg/dL (ref 70–99)

## 2014-06-18 LAB — POCT GLYCOSYLATED HEMOGLOBIN (HGB A1C): Hemoglobin A1C: 5.9

## 2014-06-18 MED ORDER — PERMETHRIN 1 % EX LOTN: TOPICAL_LOTION | CUTANEOUS | Status: DC

## 2014-06-18 NOTE — Assessment & Plan Note (Signed)
Pt follows w/ rhematology at [facility] and has an appt w/ them next month. Currently on etanercept w/o any complications. Complaining of chronic foot pain today and believes it is due to a clear pustule that was noted on exam today. However, pustule is non tender and non erythematous. Unlikely the source of patient's foot pain today and likely incidental. Pt reports that lyrica helps with peripheral neuropathy but has not been taking it TID as directed b/c it makes her drowsy.   - lyrica 50mg  TID and instructed not to drive after taking it. If after taking lyrica TID pt still complains of pain can increase to 100mg  TID.  - f/u w/ rhem at [facility] - continue taking ibuprofen for pain

## 2014-06-18 NOTE — Patient Instructions (Signed)
Start taking lyrica 50mg  three times a day.   You can use the permethrin shampoo if your scalp continues to itch and the children in your house have lice. Make sure to wash your hair over the sink when using the permethrin shampoo to limit skin exposure.

## 2014-06-18 NOTE — Assessment & Plan Note (Signed)
Lab Results  Component Value Date   HGBA1C 6.2 03/11/2014   HGBA1C 6.2 12/06/2013   HGBA1C 6.1 07/30/2013     Assessment: Diabetes control:  well controlled  Progress toward A1C goal:   at goal Comments: not on any DM medications  Plan:  Will check HbA1c today.

## 2014-06-18 NOTE — Progress Notes (Signed)
Subjective:     Patient ID: Lorraine [name], female   DOB: 1960-09-07, 53 y.o.   MRN: [id number]  HPI Pt is a 53 y/o female w/ PMHx of HTN, hep C, GERD, DM2, and RA who presents to clinic today for diabetes f/u and left foot pain. Pt has hx of chronic foot pain 2/2 diabetic peripheral neuropathy and RA. She follows with rheumatology at [facility] and has an appt with them next month. Recently switched from gabapentin to lyrica 50mg  TID for peripheral neuropathy. She states she has only been taking lyrica BID sicne she has to drive during the day. She feels that lyrica helps control foot pain better than gabapentin and that she will take ibuprofen for foot pain if she has to drive somewhere until she can take her evening dose of lyrica.   Pt is not on DM medications. Was previously on metformin and insulin in the past. Glucometer report ranges from 85-141. Denies polydipsia, polyuria, light headedness, dizziness.   Also complaining of scalp irritation x 1 week. She has children who live in her house that have also noticed scalp itching. Has tried psoriasis shampoo w/o any relief.    Review of Systems  Constitutional: Negative for fever.  Eyes:       Occasional blurry vision   Endocrine: Negative for polydipsia and polyuria.  Musculoskeletal:       Foot pain b/l   Neurological: Negative for dizziness and light-headedness.       Objective:   Physical Exam  Constitutional: She appears well-developed and well-nourished.  HENT:  Head: Normocephalic.  Posterior scalp negative for fungal infxn, erythema, flaky skin, lice, lesions.   Cardiovascular: Normal rate and regular rhythm.   Pulmonary/Chest: Effort normal and breath sounds normal.  Abdominal: Soft. Bowel sounds are normal. There is no tenderness.  Musculoskeletal:  Medial side of 4 left toe with 91mm in diameter clear pustule, non erythematous, unable to express pus, non tender to palpation.   Middle 3rd toe tender to palpation.        Assessment:     Please see problem based assessment and plan.        Plan:     Please see problem based assessment and plan.

## 2014-06-18 NOTE — Assessment & Plan Note (Signed)
Pt complains of scalp itching x 1 week and has tried psoriasis shampoo w/o relief. Pt has children in her home who have also had scalp itching. On PE did not visualize lice or any fungal infxn  - given rx for permethrin shampoo to use as needed if she notices lice

## 2014-06-22 NOTE — Progress Notes (Signed)
INTERNAL MEDICINE TEACHING ATTENDING ADDENDUM - Aldine Contes, MD: I personally saw and evaluated Ms. Tolleson in this clinic visit in conjunction with the resident, Dr. Hulen Luster. I have discussed patient's plan of care with medical resident during this visit. I have confirmed the physical exam findings and have read and agree with the clinic note including the plan with the following addition: - Pt instructed to take lyrica TID for peripheral neuropathy but not to take it if she is driving - f/u with rheum - pt prescribed permethrin shampoo for possible head lice

## 2014-07-07 ENCOUNTER — Other Ambulatory Visit: Payer: Self-pay | Admitting: Internal Medicine

## 2014-07-24 ENCOUNTER — Other Ambulatory Visit: Payer: Self-pay | Admitting: Internal Medicine

## 2014-08-07 ENCOUNTER — Other Ambulatory Visit: Payer: Self-pay | Admitting: Internal Medicine

## 2014-08-21 ENCOUNTER — Ambulatory Visit (INDEPENDENT_AMBULATORY_CARE_PROVIDER_SITE_OTHER): Payer: Medicaid Other | Admitting: Internal Medicine

## 2014-08-21 ENCOUNTER — Encounter: Payer: Self-pay | Admitting: Internal Medicine

## 2014-08-21 VITALS — BP 171/89 | HR 70 | Temp 98.4°F | Ht 63.0 in | Wt 135.4 lb

## 2014-08-21 DIAGNOSIS — E1140 Type 2 diabetes mellitus with diabetic neuropathy, unspecified: Secondary | ICD-10-CM

## 2014-08-21 DIAGNOSIS — J069 Acute upper respiratory infection, unspecified: Secondary | ICD-10-CM

## 2014-08-21 DIAGNOSIS — I10 Essential (primary) hypertension: Secondary | ICD-10-CM

## 2014-08-21 DIAGNOSIS — B182 Chronic viral hepatitis C: Secondary | ICD-10-CM

## 2014-08-21 DIAGNOSIS — M069 Rheumatoid arthritis, unspecified: Secondary | ICD-10-CM

## 2014-08-21 LAB — GLUCOSE, CAPILLARY: GLUCOSE-CAPILLARY: 125 mg/dL — AB (ref 70–99)

## 2014-08-21 MED ORDER — OXYMETAZOLINE HCL 0.05 % NA SOLN: 1.0000 | Freq: Two times a day (BID) | NASAL | Status: DC

## 2014-08-21 MED ORDER — CHLORPHENIRAMINE MALEATE 4 MG PO TABS: 4.0000 mg | ORAL_TABLET | Freq: Four times a day (QID) | ORAL | Status: DC | PRN

## 2014-08-21 MED ORDER — GUAIFENESIN ER 600 MG PO TB12: 600.0000 mg | ORAL_TABLET | Freq: Two times a day (BID) | ORAL | Status: DC | PRN

## 2014-08-21 NOTE — Assessment & Plan Note (Signed)
BP Readings from Last 3 Encounters:  08/21/14 171/89  06/18/14 138/78  05/30/14 124/77    Lab Results  Component Value Date   NA 135 01/15/2014   K 3.6 01/15/2014   CREATININE 0.67 01/15/2014    Assessment: Blood pressure control: moderately elevated Progress toward BP goal:  deteriorated Comments: Well-controlled in past.  Possibly related to headache, cold medicine, or taking BP meds late today.  Will not adjust regimen today.  Plan: Medications:  continue current medications: Prinzide 20-25 mg daily. Other plans: Continue to monitor.

## 2014-08-21 NOTE — Patient Instructions (Signed)
Thank you for coming to clinic today Lorraine [name].  General instructions: -Your symptoms are consistent with a cold. -I included some information below, including some things you can do at home. -I sent 3 prescriptions to your pharmacy. -Chlorphen will help clean out your sinuses, but it will also make you drowsy.  I would recommend taking it when you want to go to sleep. -Mucinex will help with your cough. -If the Chlorphen and Mucinex don't work, you can fill the nasal spray prescription that I sent (Afrin). -Please make a follow up appointment to return to clinic in 3 months.  Please bring your medicines with you each time you come.   Medicines may be  Eye drops  Herbal   Vitamins  Pills  Seeing these help [place] take care of you.   Upper Respiratory Infection, Adult An upper respiratory infection (URI) is also sometimes known as the common cold. The upper respiratory tract includes the nose, sinuses, throat, trachea, and bronchi. Bronchi are the airways leading to the lungs. Most people improve within 1 week, but symptoms can last up to 2 weeks. A residual cough may last even longer.  CAUSES Many different viruses can infect the tissues lining the upper respiratory tract. The tissues become irritated and inflamed and often become very moist. Mucus production is also common. A cold is contagious. You can easily spread the virus to others by oral contact. This includes kissing, sharing a glass, coughing, or sneezing. Touching your mouth or nose and then touching a surface, which is then touched by another person, can also spread the virus. SYMPTOMS  Symptoms typically develop 1 to 3 days after you come in contact with a cold virus. Symptoms vary from person to person. They may include: 5. Runny nose. 6. Sneezing. 7. Nasal congestion. 8. Sinus irritation. 9. Sore throat. 10. Loss of voice (laryngitis). 11. Cough. 12. Fatigue. 13. Muscle aches. 14. Loss of  appetite. 15. Headache. 16. Low-grade fever. DIAGNOSIS  You might diagnose your own cold based on familiar symptoms, since most people get a cold 2 to 3 times a year. Your caregiver can confirm this based on your exam. Most importantly, your caregiver can check that your symptoms are not due to another disease such as strep throat, sinusitis, pneumonia, asthma, or epiglottitis. Blood tests, throat tests, and X-rays are not necessary to diagnose a common cold, but they may sometimes be helpful in excluding other more serious diseases. Your caregiver will decide if any further tests are required. RISKS AND COMPLICATIONS  You may be at risk for a more severe case of the common cold if you smoke cigarettes, have chronic heart disease (such as heart failure) or lung disease (such as asthma), or if you have a weakened immune system. The very young and very old are also at risk for more serious infections. Bacterial sinusitis, middle ear infections, and bacterial pneumonia can complicate the common cold. The common cold can worsen asthma and chronic obstructive pulmonary disease (COPD). Sometimes, these complications can require emergency medical care and may be life-threatening. PREVENTION  The best way to protect against getting a cold is to practice good hygiene. Avoid oral or hand contact with people with cold symptoms. Wash your hands often if contact occurs. There is no clear evidence that vitamin C, vitamin E, echinacea, or exercise reduces the chance of developing a cold. However, it is always recommended to get plenty of rest and practice good nutrition. TREATMENT  Treatment is directed at relieving symptoms.  There is no cure. Antibiotics are not effective, because the infection is caused by a virus, not by bacteria. Treatment may include:  Increased fluid intake. Sports drinks offer valuable electrolytes, sugars, and fluids.  Breathing heated mist or steam (vaporizer or shower).  Eating chicken  soup or other clear broths, and maintaining good nutrition.  Getting plenty of rest.  Using gargles or lozenges for comfort.  Controlling fevers with ibuprofen or acetaminophen as directed by your caregiver.  Increasing usage of your inhaler if you have asthma. Zinc gel and zinc lozenges, taken in the first 24 hours of the common cold, can shorten the duration and lessen the severity of symptoms. Pain medicines may help with fever, muscle aches, and throat pain. A variety of non-prescription medicines are available to treat congestion and runny nose. Your caregiver can make recommendations and may suggest nasal or lung inhalers for other symptoms.  HOME CARE INSTRUCTIONS   Only take over-the-counter or prescription medicines for pain, discomfort, or fever as directed by your caregiver.  Use a warm mist humidifier or inhale steam from a shower to increase air moisture. This may keep secretions moist and make it easier to breathe.  Drink enough water and fluids to keep your urine clear or pale yellow.  Rest as needed.  Return to work when your temperature has returned to normal or as your caregiver advises. You may need to stay home longer to avoid infecting others. You can also use a face mask and careful hand washing to prevent spread of the virus. SEEK MEDICAL CARE IF:   After the first few days, you feel you are getting worse rather than better.  You need your caregiver's advice about medicines to control symptoms.  You develop chills, worsening shortness of breath, or brown or red sputum. These may be signs of pneumonia.  You develop yellow or brown nasal discharge or pain in the face, especially when you bend forward. These may be signs of sinusitis.  You develop a fever, swollen neck glands, pain with swallowing, or white areas in the back of your throat. These may be signs of strep throat. SEEK IMMEDIATE MEDICAL CARE IF:   You have a fever.  You develop severe or persistent  headache, ear pain, sinus pain, or chest pain.  You develop wheezing, a prolonged cough, cough up blood, or have a change in your usual mucus (if you have chronic lung disease).  You develop sore muscles or a stiff neck. Document Released: 01/18/2001 Document Revised: 10/17/2011 Document Reviewed: 10/30/2013 [facility] Patient Information 2015 [place], [place]. This information is not intended to replace advice given to you by your health care provider. Make sure you discuss any questions you have with your health care provider.

## 2014-08-21 NOTE — Assessment & Plan Note (Signed)
Unable to receive insurance approval for Harvoni due to minimal liver disease.  Follow up with ID annually for surveillance. -See Dr. Baxter Flattery in August.

## 2014-08-21 NOTE — Assessment & Plan Note (Signed)
Follows with [facility]. Missed appointment in December. -Encouraged to make new appointment to follow up. -Continue Enbrel 25 mg weekly injection.

## 2014-08-21 NOTE — Progress Notes (Signed)
   Subjective:    Patient ID: Lorraine [name], female    DOB: [birth year], 54 y.o.   MRN: [id number]  HPI Lorraine [name] is a 54 year old woman with history of HTN, HCV, RA (followed at [facility]), and DM2 presenting with cold symptoms.  She reports having a cold for three weeks. She says that her kids were all sick as well.  She bought some Night Time Cold and Flu from [employer] that she has been taking.  She also has been taking an [employer] cold pill.  She reports cough, headache, chills, myalgias, congestion, runny nose.  She had a sore throat initially that has gone away.  She coughing up white mucus.  She denies fevers, nausea, vomiting, SOB, or chest pain.  She took her blood pressure medication today at noon.  She saw Dr. Baxter Flattery regarding her HCV last summer. She cannot get approval for Harvoni currently (F0 score), so she is supposed to follow up in August.  She missed her appointment with [facility] for rheumatoid arthritis follow up in December.  She says she needs to call to reschedule.  Review of Systems  Constitutional: Positive for chills and fatigue. Negative for fever and appetite change.  HENT: Positive for congestion, postnasal drip and rhinorrhea. Negative for sore throat.   Respiratory: Positive for cough. Negative for shortness of breath.   Cardiovascular: Negative for chest pain.  Gastrointestinal: Negative for nausea, vomiting, abdominal pain, diarrhea and constipation.  Genitourinary: Negative for dysuria and difficulty urinating.  Musculoskeletal: Positive for myalgias and arthralgias.  Skin: Negative for rash.  Neurological: Negative for dizziness, weakness and numbness.       Objective:   Physical Exam  Constitutional: She is oriented to person, place, and time. She appears well-developed and well-nourished. No distress.  HENT:  Head: Normocephalic and atraumatic.  Mouth/Throat: Oropharyngeal exudate present.  Eyes: Conjunctivae and EOM are normal. Pupils are  equal, round, and reactive to light. No scleral icterus.  Neck: Normal range of motion. Neck supple.  Cardiovascular: Normal rate, regular rhythm and normal heart sounds.   Pulmonary/Chest: Effort normal and breath sounds normal. No respiratory distress. She has no wheezes.  Abdominal: Soft. Bowel sounds are normal. She exhibits no distension. There is no tenderness.  Musculoskeletal: Normal range of motion. She exhibits no edema or tenderness.  Lymphadenopathy:    She has no cervical adenopathy.  Neurological: She is alert and oriented to person, place, and time. She exhibits normal muscle tone.  Skin: Skin is warm and dry. No rash noted. No erythema.          Assessment & Plan:  Please see problem-based assessment and plan.

## 2014-08-21 NOTE — Progress Notes (Signed)
Internal Medicine Clinic Attending  Case discussed with Dr. Moding at the time of the visit.  We reviewed the resident's history and exam and pertinent patient test results.  I agree with the assessment, diagnosis, and plan of care documented in the resident's note. 

## 2014-08-21 NOTE — Assessment & Plan Note (Signed)
Symptoms and sick contacts consistent with common cold. -Mucinex BID PRN. -Chlorpheniramine 4 mg q6h PRN. -Continue Tylenol for pain, limit to 2 g daily with HCV. -Gave prescription for Afrin nasal spray if other tx doesn't work. -Provided home recommendations for dealing with URI.

## 2014-08-21 NOTE — Assessment & Plan Note (Signed)
Lab Results  Component Value Date   HGBA1C 5.9 06/18/2014   HGBA1C 6.2 03/11/2014   HGBA1C 6.2 12/06/2013     Assessment: Diabetes control: good control (HgbA1C at goal) Progress toward A1C goal:  at goal  Plan: Medications:  No meds. Other plans: Check A1C at next visit.

## 2014-09-03 LAB — HM DIABETES EYE EXAM

## 2014-09-08 ENCOUNTER — Encounter: Payer: Self-pay | Admitting: *Deleted

## 2014-10-14 ENCOUNTER — Encounter: Payer: Self-pay | Admitting: Internal Medicine

## 2014-10-14 ENCOUNTER — Ambulatory Visit (INDEPENDENT_AMBULATORY_CARE_PROVIDER_SITE_OTHER): Payer: Medicaid Other | Admitting: Internal Medicine

## 2014-10-14 VITALS — BP 129/77 | HR 58 | Temp 98.1°F | Resp 20 | Ht 63.5 in | Wt 135.9 lb

## 2014-10-14 DIAGNOSIS — M79672 Pain in left foot: Secondary | ICD-10-CM

## 2014-10-14 DIAGNOSIS — B351 Tinea unguium: Secondary | ICD-10-CM | POA: Insufficient documentation

## 2014-10-14 LAB — CBC
HCT: 42.2 % (ref 36.0–46.0)
HEMOGLOBIN: 14.5 g/dL (ref 12.0–15.0)
MCH: 30.9 pg (ref 26.0–34.0)
MCHC: 34.4 g/dL (ref 30.0–36.0)
MCV: 89.8 fL (ref 78.0–100.0)
MPV: 9.6 fL (ref 8.6–12.4)
Platelets: 343 10*3/uL (ref 150–400)
RBC: 4.7 MIL/uL (ref 3.87–5.11)
RDW: 14 % (ref 11.5–15.5)
WBC: 6 10*3/uL (ref 4.0–10.5)

## 2014-10-14 LAB — HEPATIC FUNCTION PANEL
ALT: 37 U/L — AB (ref 0–35)
AST: 204 U/L — ABNORMAL HIGH (ref 0–37)
Albumin: 3.9 g/dL (ref 3.5–5.2)
Alkaline Phosphatase: 80 U/L (ref 39–117)
BILIRUBIN DIRECT: 0.1 mg/dL (ref 0.0–0.3)
BILIRUBIN INDIRECT: 0.4 mg/dL (ref 0.2–1.2)
BILIRUBIN TOTAL: 0.5 mg/dL (ref 0.2–1.2)
Total Protein: 7.5 g/dL (ref 6.0–8.3)
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
## 2014-10-14 NOTE — Patient Instructions (Signed)
Be sure to follow up with Podiatry, who are the foot doctors.   I am checking labs on you today, and if they look ok, we will start medication to help improve your toenail infection.     General Instructions:   Thank you for bringing your medicines today. This helps [place] keep you safe from mistakes.   Progress Toward Treatment Goals:  Treatment Goal 08/21/2014  Hemoglobin A1C at goal  Blood pressure deteriorated  Stop smoking -    Self Care Goals & Plans:  Self Care Goal 10/14/2014  Manage my medications take my medicines as prescribed; bring my medications to every visit  Monitor my health keep track of my blood glucose; check my feet daily  Eat healthy foods eat baked foods instead of fried foods  Be physically active find an activity I enjoy  Stop smoking cut down the number of cigarettes smoked; set a quit date and stop smoking  Meeting treatment goals -    Home Blood Glucose Monitoring 10/08/2013  Check my blood sugar no home glucose monitoring  When to check my blood sugar N/A     Care Management & Community Referrals:  Referral 10/08/2013  Referrals made for care management support none needed  Referrals made to community resources -

## 2014-10-15 DIAGNOSIS — M79673 Pain in unspecified foot: Secondary | ICD-10-CM | POA: Insufficient documentation

## 2014-10-15 NOTE — Assessment & Plan Note (Addendum)
Pain in 4th toe of the left foot that has been present for a while (unable to quantify duration). Pt has a small area on the pading of her toe that is firm and is tender to palpation. The are is very small, and with firm palpation I am unable to express any fluid or discharge. According to the patient, this are has been present for a while. She has no areas of skin breakdown or ulcerations. Skin is completely intact. Suspect that this is a callus vs corn. - Referring to Podiatry

## 2014-10-15 NOTE — Assessment & Plan Note (Signed)
Pt with onychomycosis of her toenails on both feet and would benefit from systemic therapy. - Referring to podiatry - Checking LFTs and CBC - If labs look good, will start Terbinafine

## 2014-10-16 NOTE — Progress Notes (Signed)
Internal Medicine Clinic Attending  Case discussed with Dr. Glenn soon after the resident saw the patient.  We reviewed the resident's history and exam and pertinent patient test results.  I agree with the assessment, diagnosis, and plan of care documented in the resident's note. 

## 2014-11-05 ENCOUNTER — Encounter: Payer: Self-pay | Admitting: Podiatry

## 2014-11-05 ENCOUNTER — Ambulatory Visit (INDEPENDENT_AMBULATORY_CARE_PROVIDER_SITE_OTHER): Payer: Medicaid Other

## 2014-11-05 ENCOUNTER — Ambulatory Visit (INDEPENDENT_AMBULATORY_CARE_PROVIDER_SITE_OTHER): Payer: Medicaid Other | Admitting: Podiatry

## 2014-11-05 VITALS — BP 130/79 | HR 55 | Resp 18

## 2014-11-05 DIAGNOSIS — B351 Tinea unguium: Secondary | ICD-10-CM

## 2014-11-05 DIAGNOSIS — L988 Other specified disorders of the skin and subcutaneous tissue: Secondary | ICD-10-CM

## 2014-11-05 DIAGNOSIS — R52 Pain, unspecified: Secondary | ICD-10-CM

## 2014-11-05 DIAGNOSIS — L989 Disorder of the skin and subcutaneous tissue, unspecified: Secondary | ICD-10-CM

## 2014-11-05 DIAGNOSIS — M79673 Pain in unspecified foot: Secondary | ICD-10-CM

## 2014-11-05 NOTE — Patient Instructions (Signed)
Diabetes and Foot Care Diabetes may cause you to have problems because of poor blood supply (circulation) to your feet and legs. This may cause the skin on your feet to become thinner, break easier, and heal more slowly. Your skin may become dry, and the skin may peel and crack. You may also have nerve damage in your legs and feet causing decreased feeling in them. You may not notice minor injuries to your feet that could lead to infections or more serious problems. Taking care of your feet is one of the most important things you can do for yourself.  HOME CARE INSTRUCTIONS  Wear shoes at all times, even in the house. Do not go barefoot. Bare feet are easily injured.  Check your feet daily for blisters, cuts, and redness. If you cannot see the bottom of your feet, use a mirror or ask someone for help.  Wash your feet with warm water (do not use hot water) and mild soap. Then pat your feet and the areas between your toes until they are completely dry. Do not soak your feet as this can dry your skin.  Apply a moisturizing lotion or petroleum jelly (that does not contain alcohol and is unscented) to the skin on your feet and to dry, brittle toenails. Do not apply lotion between your toes.  Trim your toenails straight across. Do not dig under them or around the cuticle. File the edges of your nails with an emery board or nail file.  Do not cut corns or calluses or try to remove them with medicine.  Wear clean socks or stockings every day. Make sure they are not too tight. Do not wear knee-high stockings since they may decrease blood flow to your legs.  Wear shoes that fit properly and have enough cushioning. To break in new shoes, wear them for just a few hours a day. This prevents you from injuring your feet. Always look in your shoes before you put them on to be sure there are no objects inside.  Do not cross your legs. This may decrease the blood flow to your feet.  If you find a minor scrape,  cut, or break in the skin on your feet, keep it and the skin around it clean and dry. These areas may be cleansed with mild soap and water. Do not cleanse the area with peroxide, alcohol, or iodine.  When you remove an adhesive bandage, be sure not to damage the skin around it.  If you have a wound, look at it several times a day to make sure it is healing.  Do not use heating pads or hot water bottles. They may burn your skin. If you have lost feeling in your feet or legs, you may not know it is happening until it is too late.  Make sure your health care provider performs a complete foot exam at least annually or more often if you have foot problems. Report any cuts, sores, or bruises to your health care provider immediately. SEEK MEDICAL CARE IF:   You have an injury that is not healing.  You have cuts or breaks in the skin.  You have an ingrown nail.  You notice redness on your legs or feet.  You feel burning or tingling in your legs or feet.  You have pain or cramps in your legs and feet.  Your legs or feet are numb.  Your feet always feel cold. SEEK IMMEDIATE MEDICAL CARE IF:   There is increasing redness,   swelling, or pain in or around a wound.  There is a red line that goes up your leg.  Pus is coming from a wound.  You develop a fever or as directed by your health care provider.  You notice a bad smell coming from an ulcer or wound. Document Released: 07/22/2000 Document Revised: 03/27/2013 Document Reviewed: 01/01/2013 Exit[employer] Patient Information 2015 [employer]. This information is not intended to replace advice given to you by your health care provider. Make sure you discuss any questions you have with your health care provider.  

## 2014-11-05 NOTE — Progress Notes (Signed)
   Subjective:    Patient ID: Lorraine [name], female    DOB: 22-Mar-1961, 54 y.o.   MRN: [id number]  HPI  54 year old female presents the office today with complaints of a painful lesion on the end of her left fourth toe which is been present for greater than one year. She says the area is painful particularly with pressure. She denies a redness or drainage from around the site. She also states that she has painful, thick elongated toenails for which she is unable to trim herself. Denies any redness or drainage on the nail sites. No other complaints at this time.    Review of Systems  Musculoskeletal: Positive for back pain and gait problem.  Skin:       CHANGE IN NAIL       Objective:   Physical Exam AAO 3, NAD DP/PT pulses palpable, CRT less than 3 seconds Protective sensation appears to be intact with Derrel Nip monofilament, Achilles tendon reflex intact. Nails are hypertrophic, dystrophic, elongated, brittle, discolored 10. There subjective tenderness along nails 1-5 bilaterally. No surrounding erythema or drainage on the nail sites. There is a small punctate hyperkeratotic lesion to the distal medial aspect the left fourth digit. There is tenderness palpation over this lesion. Upon debridement there is a small area pinpoint bleeding and resembles a wart. There is no swelling erythema, ascending cellulitis or other clinical signs of infection.  Adductovarus deformity at the fifth digits. No other areas of tenderness to bilateral lower extremities. MMT 5/5, ROM WNL No other open lesions or pre-ulcerative lesions are identified bilaterally. No pain with calf compression, swelling, warmth, erythema.     Assessment & Plan:  54 year old female left fourth digit lesion, possible verruca; symptomatic onychomycosis -Treatment options were discussed the patient going alternatives, risks, complications. -For the lesion on the left fourth toe x-rays were performed which did reveal an  exostosis off of the distal phalanx which may be resulting lesion however upon debridement there was pinpoint bleeding and there did have evidence of verruca. Because of this a small amount of salinocaine was applied with a pad surrounding it to help protect is good skin and a bandage. Post procedure instructions were discussed the patient for which she verbally understood. Monitor for any signs or symptoms of infection and directed to call the office if any are to occur or go to the ER. -Nails are sharply debrided 10 without complication/bleeding. Discussed various treatment options for onychomycosis. However this time she is not making it for Lamisil. Discussed other topical treatments and she will start with over-the-counter fungi-nail -Follow-up in 3 months or sooner if any palms are to arise. In the meantime occurs to call the office with any questions, concerns, change in symptoms.

## 2014-11-18 ENCOUNTER — Other Ambulatory Visit: Payer: Self-pay | Admitting: *Deleted

## 2014-11-18 MED ORDER — FLUCONAZOLE 150 MG PO TABS: 150.0000 mg | ORAL_TABLET | Freq: Every day | ORAL | Status: DC

## 2014-11-18 NOTE — Telephone Encounter (Signed)
Pt calls and states due to her diabetes she has a vaginal yeast, would like med called in

## 2014-12-01 ENCOUNTER — Telehealth: Payer: Self-pay | Admitting: *Deleted

## 2014-12-01 NOTE — Telephone Encounter (Signed)
Pt called - sl has white vag discharge. Sl better - only took 2 Diflucan tab - suggest to take last tab. States CBG are good. Pt is eating yogurt. Pt will call back is no change. Hilda Blades Keela Rubert RN 12/01/14 10:50AM

## 2014-12-08 ENCOUNTER — Ambulatory Visit (INDEPENDENT_AMBULATORY_CARE_PROVIDER_SITE_OTHER): Payer: Medicaid Other | Admitting: Internal Medicine

## 2014-12-08 ENCOUNTER — Encounter: Payer: Self-pay | Admitting: Internal Medicine

## 2014-12-08 VITALS — BP 124/79 | HR 70 | Temp 98.1°F | Wt 135.5 lb

## 2014-12-08 DIAGNOSIS — F17210 Nicotine dependence, cigarettes, uncomplicated: Secondary | ICD-10-CM | POA: Diagnosis not present

## 2014-12-08 DIAGNOSIS — J209 Acute bronchitis, unspecified: Secondary | ICD-10-CM | POA: Insufficient documentation

## 2014-12-08 DIAGNOSIS — E1140 Type 2 diabetes mellitus with diabetic neuropathy, unspecified: Secondary | ICD-10-CM | POA: Diagnosis not present

## 2014-12-08 LAB — GLUCOSE, CAPILLARY: Glucose-Capillary: 96 mg/dL (ref 70–99)

## 2014-12-08 LAB — POCT GLYCOSYLATED HEMOGLOBIN (HGB A1C): Hemoglobin A1C: 6.6

## 2014-12-08 MED ORDER — DEXTROMETHORPHAN-GUAIFENESIN 20-400 MG PO TABS: 1.0000 | ORAL_TABLET | Freq: Four times a day (QID) | ORAL | Status: DC | PRN

## 2014-12-08 MED ORDER — PREGABALIN 50 MG PO CAPS: 50.0000 mg | ORAL_CAPSULE | Freq: Three times a day (TID) | ORAL | Status: DC

## 2014-12-08 NOTE — Patient Instructions (Signed)
General Instructions: You can stop checking your blood sugars and follow up in 6 months for your diabetes.   If you cough does not get better in 1 week then make another appointment in one week at which time we will get a chest xray.   Thank you for bringing your medicines today. This helps [place] keep you safe from mistakes.   Progress Toward Treatment Goals:  Treatment Goal 08/21/2014  Hemoglobin A1C at goal  Blood pressure deteriorated  Stop smoking -    Self Care Goals & Plans:  Self Care Goal 10/14/2014  Manage my medications take my medicines as prescribed; bring my medications to every visit  Monitor my health keep track of my blood glucose; check my feet daily  Eat healthy foods eat baked foods instead of fried foods  Be physically active find an activity I enjoy  Stop smoking cut down the number of cigarettes smoked; set a quit date and stop smoking  Meeting treatment goals -    Home Blood Glucose Monitoring 10/08/2013  Check my blood sugar no home glucose monitoring  When to check my blood sugar N/A     Care Management & Community Referrals:  Referral 10/08/2013  Referrals made for care management support none needed  Referrals made to community resources -

## 2014-12-08 NOTE — Assessment & Plan Note (Addendum)
As per HPI, pt has had cough x 3 weeks productive of yellow sputum w/o fever. Unlikely bacterial cause. On exam no wheezing and equal breath sounds. Pt also has smoking hx and currently smokes 1-2 ciggs per day.   - will treat symptomatically with muccinex - instructed to return to clinic if symptoms do not improve within 1 week. At that time will get a CXR.

## 2014-12-08 NOTE — Progress Notes (Signed)
   Subjective:    Patient ID: Lorraine [name], female    DOB: November 01, 1960, 54 y.o.   MRN: [id number]  HPI Pt is a 54 y/o F w/ PMHx of HTN, chronic hep C, DM2, and RA who presents to clinic for DM f/u and coughing x 3 weeks. Pt states she has had a productive cough of yellow sputum x 3 weeks. Her daughter is also experiencing the same symptoms. She denies fever and NS. She is a current smoker and smokes 1-2 cigarettes per day. Has had these same symptoms before and was prescribed an inhaler in the ED.     Review of Systems  Constitutional: Positive for chills. Negative for fever.  HENT: Negative for sore throat.   Respiratory: Positive for cough. Negative for shortness of breath.   Cardiovascular: Negative for chest pain.  Gastrointestinal: Positive for nausea. Negative for vomiting and abdominal pain.       Objective:   Physical Exam  Constitutional: She appears well-developed and well-nourished. No distress.  HENT:  Head: Normocephalic.  Mouth/Throat: No oropharyngeal exudate.  Cardiovascular: Normal rate and regular rhythm.   Pulmonary/Chest: Effort normal and breath sounds normal. No respiratory distress. She has no wheezes.  Abdominal: Soft. Bowel sounds are normal.  Lymphadenopathy:    She has no cervical adenopathy.          Assessment & Plan:  Please see problem based assessment and plan.

## 2014-12-08 NOTE — Assessment & Plan Note (Signed)
Lab Results  Component Value Date   HGBA1C 6.6 12/08/2014   HGBA1C 5.9 06/18/2014   HGBA1C 6.2 03/11/2014     Assessment: Diabetes control:  controlled Progress toward A1C goal:   at goal Comments: diet controlled, brought glucometer in with CBGs ranging from 94-137.   Plan: Medications:  Continue DM diet, instructed pt she no longer has to check CBGs and can follow up for diabetes q6 months. Refilled lyrica.  Educational resources provided: brochure Self management tools provided: copy of home glucose meter download

## 2014-12-09 NOTE — Progress Notes (Signed)
INTERNAL MEDICINE TEACHING ATTENDING ADDENDUM - Aldine Contes, MD: I reviewed and discussed at the time of visit with the resident Dr. Hulen Luster, the patient's medical history, physical examination, diagnosis and results of pertinent tests and treatment and I agree with the patient's care as documented.

## 2015-01-21 ENCOUNTER — Other Ambulatory Visit: Payer: Self-pay | Admitting: Internal Medicine

## 2015-02-06 ENCOUNTER — Encounter: Payer: Self-pay | Admitting: Podiatry

## 2015-02-06 ENCOUNTER — Ambulatory Visit (INDEPENDENT_AMBULATORY_CARE_PROVIDER_SITE_OTHER): Payer: Medicaid Other | Admitting: Podiatry

## 2015-02-06 VITALS — BP 135/87 | HR 84 | Resp 15

## 2015-02-06 DIAGNOSIS — B351 Tinea unguium: Secondary | ICD-10-CM | POA: Diagnosis not present

## 2015-02-06 DIAGNOSIS — M79673 Pain in unspecified foot: Secondary | ICD-10-CM | POA: Diagnosis not present

## 2015-02-06 DIAGNOSIS — R52 Pain, unspecified: Secondary | ICD-10-CM

## 2015-02-06 NOTE — Progress Notes (Signed)
   Subjective:    Patient ID: Lorraine [name], female    DOB: 1960-09-28, 54 y.o.   MRN: [id number]  HPI  54 year old female presents the office today with complaints of a painful lesion on the end of her left fourth toe which is been present for greater than one year. She says the area is painful particularly with pressure. She denies a redness or drainage from around the site. She also states that she has painful, thick elongated toenails for which she is unable to trim herself. Denies any redness or drainage on the nail sites. No other complaints at this time.    Review of Systems  Musculoskeletal: Positive for back pain and gait problem.  Skin:       CHANGE IN NAIL       Objective:   Physical Exam AAO 3, NAD DP/PT pulses palpable, CRT less than 3 seconds Protective sensation appears to be intact with Derrel Nip monofilament, Achilles tendon reflex intact. Nails are hypertrophic, dystrophic, elongated, brittle, discolored 10. There subjective tenderness along nails 1-5 bilaterally. No surrounding erythema or drainage on the nail sites. There is a small punctate hyperkeratotic lesion to the distal medial aspect the left fourth digit. There is tenderness palpation over this lesion. Upon debridement there is a small area pinpoint bleeding and resembles a wart. There is no swelling erythema, ascending cellulitis or other clinical signs of infection.  Adductovarus deformity at the fifth digits. No other areas of tenderness to bilateral lower extremities. MMT 5/5, ROM WNL No other open lesions or pre-ulcerative lesions are identified bilaterally. No pain with calf compression, swelling, warmth, erythema.     Assessment & Plan:  54 year old female left fourth digit lesion, possible verruca; symptomatic onychomycosis -Treatment options were discussed the patient going alternatives, risks, complications. -For the lesion on the left fourth toe x-rays were performed which did reveal an  exostosis off of the distal phalanx which may be resulting lesion however upon debridement there was pinpoint bleeding and there did have evidence of verruca. Because of this a small amount of salinocaine was applied with a pad surrounding it to help protect is good skin and a bandage. Post procedure instructions were discussed the patient for which she verbally understood. Monitor for any signs or symptoms of infection and directed to call the office if any are to occur or go to the ER. -Nails are sharply debrided 10 without complication/bleeding. Discussed various treatment options for onychomycosis. However this time she is not making it for Lamisil. Discussed other topical treatments and she will start with over-the-counter fungi-nail -Follow-up in 3 months or sooner if any palms are to arise. In the meantime occurs to call the office with any questions, concerns, change in symptoms.

## 2015-03-25 ENCOUNTER — Other Ambulatory Visit: Payer: Self-pay | Admitting: Internal Medicine

## 2015-04-01 ENCOUNTER — Encounter: Payer: Self-pay | Admitting: Internal Medicine

## 2015-04-01 ENCOUNTER — Ambulatory Visit (INDEPENDENT_AMBULATORY_CARE_PROVIDER_SITE_OTHER): Payer: Medicaid Other | Admitting: Internal Medicine

## 2015-04-01 VITALS — BP 128/70 | HR 58 | Temp 98.2°F | Wt 135.2 lb

## 2015-04-01 DIAGNOSIS — H00014 Hordeolum externum left upper eyelid: Secondary | ICD-10-CM

## 2015-04-01 DIAGNOSIS — I10 Essential (primary) hypertension: Secondary | ICD-10-CM

## 2015-04-01 NOTE — Patient Instructions (Signed)
Apply warm compresses to your eye. If your eye does not get better in 1 week please return to the clinic.  General Instructions:   Please bring your medicines with you each time you come to clinic.  Medicines may include prescription medications, over-the-counter medications, herbal remedies, eye drops, vitamins, or other pills.   Progress Toward Treatment Goals:  Treatment Goal 08/21/2014  Hemoglobin A1C at goal  Blood pressure deteriorated  Stop smoking -    Self Care Goals & Plans:  Self Care Goal 12/08/2014  Manage my medications take my medicines as prescribed; bring my medications to every visit; refill my medications on time  Monitor my health -  Eat healthy foods eat foods that are low in salt; eat baked foods instead of fried foods  Be physically active find an activity I enjoy  Stop smoking go to the [employer] (https://[employer]/); cut down the number of cigarettes smoked  Meeting treatment goals -    Home Blood Glucose Monitoring 10/08/2013  Check my blood sugar no home glucose monitoring  When to check my blood sugar N/A     Care Management & Community Referrals:  Referral 10/08/2013  Referrals made for care management support none needed  Referrals made to community resources -

## 2015-04-01 NOTE — Assessment & Plan Note (Signed)
BP well controlled, 128/70 today.  - Continue lisinopril-HCTZ 20-25 mg daily

## 2015-04-01 NOTE — Assessment & Plan Note (Signed)
Exam findings consistent with a stye of the left upper eyelid. Recommended for patient to use warm compresses throughout the day to help the swelling. She was instructed to return to clinic if the stye does not improve in 1 week.

## 2015-04-01 NOTE — Progress Notes (Signed)
   Subjective:    Patient ID: Lorraine [name], female    DOB: 11/07/1960, 54 y.o.   MRN: [id number]  HPI Ms. Laakso is a 54yo woman with PMHx of HTN, type 2 DM, chronic hepatitis C, and rheumatoid arthritis who presents today for an acute visit.   Patient describes having left-sided eye pain 2 days ago when blinking and then woke up this morning and her left eyelid was swollen. She reports tearing. She denies any discharge, loss of vision, or changes in vision. She denies pain when moving her eyes in any direction. Pain is only when blinking. She denies any fevers, chills.    Review of Systems General: Denies night sweats, changes in weight, changes in appetite HEENT: Denies headaches, rhinorrhea, sore throat CV: Denies CP, palpitations, SOB, orthopnea Pulm: Denies SOB, cough, wheezing GI: Denies abdominal pain, nausea, vomiting, diarrhea, constipation, melena, hematochezia GU: Denies dysuria, hematuria, frequency Msk: Denies muscle cramps, joint pains Neuro: Denies weakness, numbness, tingling Skin: Denies bruising    Objective:   Physical Exam General: alert, sitting up in chair, NAD HEENT: [place]/AT, EOMI, no pain with eye movements. Her left upper eyelid is swollen and erythematous compared to right side. There appears to be a small lump similar to a pimple on the left upper eyelid. There is some tenderness to palpation of the eyelid. No tenderness in surrounding structures. No discharge appreciated. Right eye appears normal.  CV: RRR, no m/g/r Pulm: CTA bilaterally, breaths non-labored Neuro: alert and oriented x 3, no focal deficits    Assessment & Plan:  Please refer to A&P documentation.

## 2015-04-06 NOTE — Progress Notes (Signed)
Internal Medicine Clinic Attending  Case discussed with Dr. Rivet soon after the resident saw the patient.  We reviewed the resident's history and exam and pertinent patient test results.  I agree with the assessment, diagnosis, and plan of care documented in the resident's note.  

## 2015-04-16 ENCOUNTER — Ambulatory Visit (INDEPENDENT_AMBULATORY_CARE_PROVIDER_SITE_OTHER): Payer: Medicaid Other | Admitting: Internal Medicine

## 2015-04-16 ENCOUNTER — Encounter: Payer: Self-pay | Admitting: Internal Medicine

## 2015-04-16 VITALS — BP 131/75 | HR 55 | Temp 98.5°F | Ht 63.0 in | Wt 136.5 lb

## 2015-04-16 DIAGNOSIS — M79675 Pain in left toe(s): Secondary | ICD-10-CM

## 2015-04-16 DIAGNOSIS — Z0000 Encounter for general adult medical examination without abnormal findings: Secondary | ICD-10-CM

## 2015-04-16 DIAGNOSIS — M069 Rheumatoid arthritis, unspecified: Secondary | ICD-10-CM

## 2015-04-16 DIAGNOSIS — E1140 Type 2 diabetes mellitus with diabetic neuropathy, unspecified: Secondary | ICD-10-CM

## 2015-04-16 LAB — GLUCOSE, CAPILLARY: GLUCOSE-CAPILLARY: 117 mg/dL — AB (ref 65–99)

## 2015-04-16 LAB — POCT GLYCOSYLATED HEMOGLOBIN (HGB A1C): HEMOGLOBIN A1C: 6.3

## 2015-04-16 NOTE — Assessment & Plan Note (Signed)
Stable, follows with [facility] for enbrel. Given number to their clinic for follow up.

## 2015-04-16 NOTE — Patient Instructions (Signed)
[facility] [phone]

## 2015-04-16 NOTE — Assessment & Plan Note (Signed)
Lab Results  Component Value Date   HGBA1C 6.3 04/16/2015   HGBA1C 6.6 12/08/2014   HGBA1C 5.9 06/18/2014     Assessment: Diabetes control:  controlled Progress toward A1C goal:   at goal Comments: dx in 2013 with A1c of 10.2 but since then all HbA1c's have been less than 6.5 except for one on 12/08/2014. Not on any DM meds.   Plan: Medications:  none Other plans: f/u in 6 months for HbA1c, if WNL can recheck HbA1c yearly.

## 2015-04-16 NOTE — Assessment & Plan Note (Signed)
Pt is due for a pap smear and will make an appointment for it. Referral placed for mammogram. Refused flu shot today.

## 2015-04-16 NOTE — Progress Notes (Signed)
   Subjective:    Patient ID: Lorraine [name], female    DOB: 08-May-1961, 54 y.o.   MRN: [id number]  HPI 54 y/o F w/ PMHx of diet controlled DM, HTN, Hep C, hx of Hep B, RA on enabrel and GERD who presents for DM f/u. Med rec, problem list review, and preventative health maintenance also updated during exam. Please see problem list for further details.      Review of Systems  Constitutional: Negative for activity change and appetite change.  Respiratory: Negative for shortness of breath.   Cardiovascular: Negative for chest pain.  Gastrointestinal: Negative for abdominal pain.  Musculoskeletal:       Left foot pain   Neurological: Negative for headaches.       Objective:   Physical Exam  Constitutional: She appears well-developed and well-nourished. No distress.  Cardiovascular: Normal rate, regular rhythm and normal heart sounds.   No murmur heard. Pulmonary/Chest: Effort normal and breath sounds normal. No respiratory distress. She has no wheezes. She has no rales.  Abdominal: Soft. Bowel sounds are normal. She exhibits no distension. There is no tenderness.  Musculoskeletal: She exhibits no edema.  Neurological: She is alert.  Skin: Skin is warm and dry.  On medial side of 4th left toe there is a white pinpoint that is tender and unchanged from prior exams          Assessment & Plan:  Please see problem based assessment and plan.

## 2015-04-16 NOTE — Assessment & Plan Note (Signed)
Pt complaining of left 4th toe pain where a small pinpoint white bump is on the medial side. This has been present and unchanged for around 1 year. She saw podiatry in July and the bump was debrided and has since come back. Xray of the left foot reveals an exostosis off of the distal phalanx which may be resulting. Podiatry saw evidence of verruca and treated it with salinocaine. She has follow up with podiatry in October.

## 2015-04-21 NOTE — Progress Notes (Signed)
Internal Medicine Clinic Attending  Case discussed with Dr. Truong at the time of the visit.  We reviewed the resident's history and exam and pertinent patient test results.  I agree with the assessment, diagnosis, and plan of care documented in the resident's note.  

## 2015-04-28 ENCOUNTER — Other Ambulatory Visit: Payer: Self-pay | Admitting: Internal Medicine

## 2015-04-28 DIAGNOSIS — Z1231 Encounter for screening mammogram for malignant neoplasm of breast: Secondary | ICD-10-CM

## 2015-05-14 ENCOUNTER — Encounter (HOSPITAL_COMMUNITY): Payer: Self-pay | Admitting: Emergency Medicine

## 2015-05-14 ENCOUNTER — Ambulatory Visit: Payer: Medicaid Other | Admitting: Podiatry

## 2015-05-14 ENCOUNTER — Other Ambulatory Visit: Payer: Self-pay | Admitting: Internal Medicine

## 2015-05-14 ENCOUNTER — Emergency Department (HOSPITAL_COMMUNITY)
Admission: EM | Admit: 2015-05-14 | Discharge: 2015-05-14 | Disposition: A | Discharge status: Home / Self Care | Payer: Medicaid Other | Attending: Emergency Medicine | Admitting: Emergency Medicine

## 2015-05-14 DIAGNOSIS — Z8611 Personal history of tuberculosis: Secondary | ICD-10-CM | POA: Diagnosis not present

## 2015-05-14 DIAGNOSIS — Z760 Encounter for issue of repeat prescription: Secondary | ICD-10-CM | POA: Diagnosis not present

## 2015-05-14 DIAGNOSIS — Z720 Tobacco use: Secondary | ICD-10-CM | POA: Insufficient documentation

## 2015-05-14 DIAGNOSIS — I10 Essential (primary) hypertension: Secondary | ICD-10-CM | POA: Insufficient documentation

## 2015-05-14 DIAGNOSIS — E119 Type 2 diabetes mellitus without complications: Secondary | ICD-10-CM | POA: Diagnosis not present

## 2015-05-14 DIAGNOSIS — Z8619 Personal history of other infectious and parasitic diseases: Secondary | ICD-10-CM | POA: Insufficient documentation

## 2015-05-14 DIAGNOSIS — M79675 Pain in left toe(s): Secondary | ICD-10-CM

## 2015-05-14 DIAGNOSIS — Z79899 Other long term (current) drug therapy: Secondary | ICD-10-CM | POA: Insufficient documentation

## 2015-05-14 MED ORDER — KETOROLAC TROMETHAMINE 30 MG/ML IM SOLN: 30.0000 mg | Freq: Once | INTRAMUSCULAR | Status: DC

## 2015-05-14 MED ORDER — KETOROLAC TROMETHAMINE 30 MG/ML IJ SOLN
30.0000 mg | Freq: Once | INTRAMUSCULAR | Status: AC
  Administered 2015-05-14: 30 mg via INTRAMUSCULAR
  Filled 2015-05-14: qty 1

## 2015-05-14 NOTE — ED Notes (Addendum)
Pt reports "I have been out of my Enbrel for a month and my feet have been swollen since then. MY hands hurt too. My doctor cannot see me until next year." Pt feet do not appear swollen upon assessment.

## 2015-05-14 NOTE — Discharge Instructions (Signed)
Take 4 over the counter ibuprofen tablets 3 times a day or 2 over-the-counter naproxen tablets twice a day for pain.  Musculoskeletal Pain Musculoskeletal pain is muscle and boney aches and pains. These pains can occur in any part of the body. Your caregiver may treat you without knowing the cause of the pain. They may treat you if blood or urine tests, X-rays, and other tests were normal.  CAUSES There is often not a definite cause or reason for these pains. These pains may be caused by a type of germ (virus). The discomfort may also come from overuse. Overuse includes working out too hard when your body is not fit. Boney aches also come from weather changes. Bone is sensitive to atmospheric pressure changes. HOME CARE INSTRUCTIONS   Ask when your test results will be ready. Make sure you get your test results.  Only take over-the-counter or prescription medicines for pain, discomfort, or fever as directed by your caregiver. If you were given medications for your condition, do not drive, operate machinery or power tools, or sign legal documents for 24 hours. Do not drink alcohol. Do not take sleeping pills or other medications that may interfere with treatment.  Continue all activities unless the activities cause more pain. When the pain lessens, slowly resume normal activities. Gradually increase the intensity and duration of the activities or exercise.  During periods of severe pain, bed rest may be helpful. Lay or sit in any position that is comfortable.  Putting ice on the injured area.  Put ice in a bag.  Place a towel between your skin and the bag.  Leave the ice on for 15 to 20 minutes, 3 to 4 times a day.  Follow up with your caregiver for continued problems and no reason can be found for the pain. If the pain becomes worse or does not go away, it may be necessary to repeat tests or do additional testing. Your caregiver may need to look further for a possible cause. SEEK IMMEDIATE  MEDICAL CARE IF:  You have pain that is getting worse and is not relieved by medications.  You develop chest pain that is associated with shortness or breath, sweating, feeling sick to your stomach (nauseous), or throw up (vomit).  Your pain becomes localized to the abdomen.  You develop any new symptoms that seem different or that concern you. MAKE SURE YOU:   Understand these instructions.  Will watch your condition.  Will get help right away if you are not doing well or get worse.   This information is not intended to replace advice given to you by your health care provider. Make sure you discuss any questions you have with your health care provider.   Document Released: 07/25/2005 Document Revised: 10/17/2011 Document Reviewed: 03/29/2013 Elsevier Interactive Patient Education [employer].

## 2015-05-14 NOTE — Telephone Encounter (Signed)
Pt requesting Enbrel to be filled. Please call pt back.
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
## 2015-05-20 ENCOUNTER — Ambulatory Visit (INDEPENDENT_AMBULATORY_CARE_PROVIDER_SITE_OTHER): Payer: Medicaid Other | Admitting: Podiatry

## 2015-05-20 ENCOUNTER — Encounter: Payer: Self-pay | Admitting: Podiatry

## 2015-05-20 DIAGNOSIS — B351 Tinea unguium: Secondary | ICD-10-CM | POA: Diagnosis not present

## 2015-05-20 DIAGNOSIS — M79676 Pain in unspecified toe(s): Secondary | ICD-10-CM

## 2015-05-20 NOTE — Patient Instructions (Signed)
Diabetes and Foot Care Diabetes may cause you to have problems because of poor blood supply (circulation) to your feet and legs. This may cause the skin on your feet to become thinner, break easier, and heal more slowly. Your skin may become dry, and the skin may peel and crack. You may also have nerve damage in your legs and feet causing decreased feeling in them. You may not notice minor injuries to your feet that could lead to infections or more serious problems. Taking care of your feet is one of the most important things you can do for yourself.  HOME CARE INSTRUCTIONS  Wear shoes at all times, even in the house. Do not go barefoot. Bare feet are easily injured.  Check your feet daily for blisters, cuts, and redness. If you cannot see the bottom of your feet, use a mirror or ask someone for help.  Wash your feet with warm water (do not use hot water) and mild soap. Then pat your feet and the areas between your toes until they are completely dry. Do not soak your feet as this can dry your skin.  Apply a moisturizing lotion or petroleum jelly (that does not contain alcohol and is unscented) to the skin on your feet and to dry, brittle toenails. Do not apply lotion between your toes.  Trim your toenails straight across. Do not dig under them or around the cuticle. File the edges of your nails with an emery board or nail file.  Do not cut corns or calluses or try to remove them with medicine.  Wear clean socks or stockings every day. Make sure they are not too tight. Do not wear knee-high stockings since they may decrease blood flow to your legs.  Wear shoes that fit properly and have enough cushioning. To break in new shoes, wear them for just a few hours a day. This prevents you from injuring your feet. Always look in your shoes before you put them on to be sure there are no objects inside.  Do not cross your legs. This may decrease the blood flow to your feet.  If you find a minor scrape,  cut, or break in the skin on your feet, keep it and the skin around it clean and dry. These areas may be cleansed with mild soap and water. Do not cleanse the area with peroxide, alcohol, or iodine.  When you remove an adhesive bandage, be sure not to damage the skin around it.  If you have a wound, look at it several times a day to make sure it is healing.  Do not use heating pads or hot water bottles. They may burn your skin. If you have lost feeling in your feet or legs, you may not know it is happening until it is too late.  Make sure your health care provider performs a complete foot exam at least annually or more often if you have foot problems. Report any cuts, sores, or bruises to your health care provider immediately. SEEK MEDICAL CARE IF:   You have an injury that is not healing.  You have cuts or breaks in the skin.  You have an ingrown nail.  You notice redness on your legs or feet.  You feel burning or tingling in your legs or feet.  You have pain or cramps in your legs and feet.  Your legs or feet are numb.  Your feet always feel cold. SEEK IMMEDIATE MEDICAL CARE IF:   There is increasing redness,   swelling, or pain in or around a wound.  There is a red line that goes up your leg.  Pus is coming from a wound.  You develop a fever or as directed by your health care provider.  You notice a bad smell coming from an ulcer or wound.   This information is not intended to replace advice given to you by your health care provider. Make sure you discuss any questions you have with your health care provider.   Document Released: 07/22/2000 Document Revised: 03/27/2013 Document Reviewed: 01/01/2013 Elsevier Interactive Patient Education 2016 [employer]  

## 2015-05-21 NOTE — Progress Notes (Signed)
Patient ID: Lorraine [name], female   DOB: [birth year], 54 y.o.   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

## 2015-05-25 ENCOUNTER — Encounter: Payer: Self-pay | Admitting: Internal Medicine

## 2015-06-01 ENCOUNTER — Ambulatory Visit
Admission: RE | Admit: 2015-06-01 | Discharge: 2015-06-01 | Disposition: A | Discharge status: Home / Self Care | Payer: Medicaid Other | Source: Ambulatory Visit | Attending: Internal Medicine | Admitting: Internal Medicine

## 2015-06-01 DIAGNOSIS — Z1231 Encounter for screening mammogram for malignant neoplasm of breast: Secondary | ICD-10-CM

## 2015-06-11 ENCOUNTER — Other Ambulatory Visit: Payer: Self-pay | Admitting: Internal Medicine

## 2015-06-12 NOTE — Telephone Encounter (Signed)
Tried to call in, tech could not take script, pharm has stepped away, will call back

## 2015-06-12 NOTE — Telephone Encounter (Signed)
Called to pharm 

## 2015-06-19 ENCOUNTER — Emergency Department (HOSPITAL_COMMUNITY)
Admission: EM | Admit: 2015-06-19 | Discharge: 2015-06-19 | Disposition: A | Discharge status: Home / Self Care | Payer: Medicaid Other | Attending: Emergency Medicine | Admitting: Emergency Medicine

## 2015-06-19 ENCOUNTER — Encounter (HOSPITAL_COMMUNITY): Payer: Self-pay | Admitting: Emergency Medicine

## 2015-06-19 ENCOUNTER — Emergency Department (HOSPITAL_COMMUNITY): Payer: Medicaid Other

## 2015-06-19 DIAGNOSIS — Z79899 Other long term (current) drug therapy: Secondary | ICD-10-CM | POA: Insufficient documentation

## 2015-06-19 DIAGNOSIS — Z720 Tobacco use: Secondary | ICD-10-CM | POA: Diagnosis not present

## 2015-06-19 DIAGNOSIS — K029 Dental caries, unspecified: Secondary | ICD-10-CM | POA: Insufficient documentation

## 2015-06-19 DIAGNOSIS — R059 Cough, unspecified: Secondary | ICD-10-CM

## 2015-06-19 DIAGNOSIS — M1990 Unspecified osteoarthritis, unspecified site: Secondary | ICD-10-CM | POA: Insufficient documentation

## 2015-06-19 DIAGNOSIS — Z8611 Personal history of tuberculosis: Secondary | ICD-10-CM | POA: Diagnosis not present

## 2015-06-19 DIAGNOSIS — Z8619 Personal history of other infectious and parasitic diseases: Secondary | ICD-10-CM | POA: Diagnosis not present

## 2015-06-19 DIAGNOSIS — K219 Gastro-esophageal reflux disease without esophagitis: Secondary | ICD-10-CM | POA: Insufficient documentation

## 2015-06-19 DIAGNOSIS — J159 Unspecified bacterial pneumonia: Secondary | ICD-10-CM | POA: Diagnosis not present

## 2015-06-19 DIAGNOSIS — I10 Essential (primary) hypertension: Secondary | ICD-10-CM | POA: Insufficient documentation

## 2015-06-19 DIAGNOSIS — R05 Cough: Secondary | ICD-10-CM

## 2015-06-19 DIAGNOSIS — E119 Type 2 diabetes mellitus without complications: Secondary | ICD-10-CM | POA: Diagnosis not present

## 2015-06-19 DIAGNOSIS — J189 Pneumonia, unspecified organism: Secondary | ICD-10-CM

## 2015-06-19 DIAGNOSIS — Z8781 Personal history of (healed) traumatic fracture: Secondary | ICD-10-CM | POA: Diagnosis not present

## 2015-06-19 MED ORDER — AZITHROMYCIN 250 MG PO TABS: 250.0000 mg | ORAL_TABLET | Freq: Every day | ORAL | Status: DC

## 2015-06-19 MED ORDER — ALBUTEROL SULFATE HFA 108 (90 BASE) MCG/ACT IN AERS: 1.0000 | INHALATION_SPRAY | Freq: Four times a day (QID) | RESPIRATORY_TRACT | Status: DC | PRN

## 2015-06-19 NOTE — Discharge Instructions (Signed)
Take the prescribed medication as directed.  Use inhaler as needed. Follow-up with your primary care physician once you have completed antibiotics to make sure pneumonia has resolved. Return to the ED for new or worsening symptoms.  Community-Acquired Pneumonia, Adult Pneumonia is an infection of the lungs. There are different types of pneumonia. One type can develop while a person is in a hospital. A different type, called community-acquired pneumonia, develops in people who are not, or have not recently been, in the hospital or other health care facility.  CAUSES Pneumonia may be caused by bacteria, viruses, or funguses. Community-acquired pneumonia is often caused by Streptococcus pneumonia bacteria. These bacteria are often passed from one person to another by breathing in droplets from the cough or sneeze of an infected person. RISK FACTORS The condition is more likely to develop in:  People who havechronic diseases, such as chronic obstructive pulmonary disease (COPD), asthma, congestive heart failure, cystic fibrosis, diabetes, or kidney disease.  People who haveearly-stage or late-stage HIV.  People who havesickle cell disease.  People who havehad their spleen removed (splenectomy).  People who havepoor Human resources officer.  People who havemedical conditions that increase the risk of breathing in (aspirating) secretions their own mouth and nose.   People who havea weakened immune system (immunocompromised).  People who smoke.  People whotravel to areas where pneumonia-causing germs commonly exist.  People whoare around animal habitats or animals that have pneumonia-causing germs, including birds, bats, rabbits, cats, and farm animals. SYMPTOMS Symptoms of this condition include:  Adry cough.  A wet (productive) cough.  Fever.  Sweating.  Chest pain, especially when breathing deeply or coughing.  Rapid breathing or difficulty breathing.  Shortness of  breath.  Shaking chills.  Fatigue.  Muscle aches. DIAGNOSIS Your health care provider will take a medical history and perform a physical exam. You may also have other tests, including:  Imaging studies of your chest, including X-rays.  Tests to check your blood oxygen level and other blood gases.  Other tests on blood, mucus (sputum), fluid around your lungs (pleural fluid), and urine. If your pneumonia is severe, other tests may be done to identify the specific cause of your illness. TREATMENT The type of treatment that you receive depends on many factors, such as the cause of your pneumonia, the medicines you take, and other medical conditions that you have. For most adults, treatment and recovery from pneumonia may occur at home. In some cases, treatment must happen in a hospital. Treatment may include:  Antibiotic medicines, if the pneumonia was caused by bacteria.  Antiviral medicines, if the pneumonia was caused by a virus.  Medicines that are given by mouth or through an IV tube.  Oxygen.  Respiratory therapy. Although rare, treating severe pneumonia may include:  Mechanical ventilation. This is done if you are not breathing well on your own and you cannot maintain a safe blood oxygen level.  Thoracentesis. This procedureremoves fluid around one lung or both lungs to help you breathe better. HOME CARE INSTRUCTIONS  Take over-the-counter and prescription medicines only as told by your health care provider.  Only takecough medicine if you are losing sleep. Understand that cough medicine can prevent your body's natural ability to remove mucus from your lungs.  If you were prescribed an antibiotic medicine, take it as told by your health care provider. Do not stop taking the antibiotic even if you start to feel better.  Sleep in a semi-upright position at night. Try sleeping in a reclining chair,  or place a few pillows under your head.  Do not use tobacco products,  including cigarettes, chewing tobacco, and e-cigarettes. If you need help quitting, ask your health care provider.  Drink enough water to keep your urine clear or pale yellow. This will help to thin out mucus secretions in your lungs. PREVENTION There are ways that you can decrease your risk of developing community-acquired pneumonia. Consider getting a pneumococcal vaccine if:  You are older than 56 years of age.  You are older than 56 years of age and are undergoing cancer treatment, have chronic lung disease, or have other medical conditions that affect your immune system. Ask your health care provider if this applies to you. There are different types and schedules of pneumococcal vaccines. Ask your health care provider which vaccination option is best for you. You may also prevent community-acquired pneumonia if you take these actions:  Get an influenza vaccine every year. Ask your health care provider which type of influenza vaccine is best for you.  Go to the dentist on a regular basis.  Wash your hands often. Use hand sanitizer if soap and water are not available. SEEK MEDICAL CARE IF:  You have a fever.  You are losing sleep because you cannot control your cough with cough medicine. SEEK IMMEDIATE MEDICAL CARE IF:  You have worsening shortness of breath.  You have increased chest pain.  Your sickness becomes worse, especially if you are an older adult or have a weakened immune system.  You cough up blood.   This information is not intended to replace advice given to you by your health care provider. Make sure you discuss any questions you have with your health care provider.   Document Released: 07/25/2005 Document Revised: 04/15/2015 Document Reviewed: 11/19/2014 Elsevier Interactive Patient Education [employer].

## 2015-06-19 NOTE — ED Notes (Signed)
Patient states cold sx x 1 week.  Patient states she has productive cough with white sputum return.   Patient states some mild nasal congestion and chest congestion.
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
## 2015-06-20 ENCOUNTER — Other Ambulatory Visit: Payer: Self-pay | Admitting: Internal Medicine

## 2015-06-24 ENCOUNTER — Encounter: Payer: Self-pay | Admitting: Internal Medicine

## 2015-06-24 ENCOUNTER — Telehealth: Payer: Self-pay | Admitting: *Deleted

## 2015-06-24 ENCOUNTER — Ambulatory Visit (INDEPENDENT_AMBULATORY_CARE_PROVIDER_SITE_OTHER): Payer: Medicaid Other | Admitting: Internal Medicine

## 2015-06-24 VITALS — BP 140/75 | HR 62 | Temp 97.8°F | Ht 63.0 in | Wt 137.2 lb

## 2015-06-24 DIAGNOSIS — J189 Pneumonia, unspecified organism: Secondary | ICD-10-CM | POA: Insufficient documentation

## 2015-06-24 MED ORDER — OMEPRAZOLE 20 MG PO CPDR: 20.0000 mg | DELAYED_RELEASE_CAPSULE | Freq: Every day | ORAL | Status: DC

## 2015-06-24 MED ORDER — GUAIFENESIN ER 600 MG PO TB12: 600.0000 mg | ORAL_TABLET | Freq: Two times a day (BID) | ORAL | Status: DC

## 2015-06-24 NOTE — Telephone Encounter (Signed)
Received PA request from pt's pharmacy for lyrica-medication is non-preferred with medicaid. Pt has been on gabapentin and clonazepam in the past. Request approved for one year, pharmacy aware.[name]11/16/20169:44 AM  [id number] [facility]  [id number] Q Taler C Helin 06/24/2015  APPROVED 06/24/2015 - 06/23/2016

## 2015-06-24 NOTE — Progress Notes (Signed)
   Patient ID: Lorraine [name] female   DOB: 03/20/1961 54 y.o.   MRN: [id number]  Subjective:   HPI: Lorraine [name] is a 54 y.o. with PMH of hep B + C, polysubstance abuse, h/o pulmonary TB, DM, HTN, and COPD who presents to [facility] today for ER follow-up for CAP.   Please see problem-based charting for status of medical issues pertinent to this visit.  Review of Systems: Pertinent items noted in HPI and remainder of comprehensive ROS otherwise negative.  Objective:  Physical Exam: Filed Vitals:   06/24/15 0850  BP: 140/75  Pulse: 62  Temp: 97.8 F (36.6 C)  TempSrc: Oral  Height: 5\' 3"  (1.6 m)  Weight: 137 lb 3.2 oz (62.234 kg)  SpO2: 97%   Gen: Well-appearing, alert and oriented to person, place, and time HEENT: Oropharynx clear without erythema or exudate.  Neck: No cervical LAD, no thyromegaly or nodules, no JVD noted. CV: Normal rate, regular rhythm, no murmurs, rubs, or gallops Pulmonary: Normal effort, CTA bilaterally, no wheezing. Mild coarse breath sounds in the left lower lobe. Otherwise clear. Abdominal: Soft, non-tender, non-distended, without rebound, guarding, or masses Extremities: Distal pulses 2+ in upper and lower extremities bilaterally, no tenderness, erythema or edema Skin: No atypical appearing moles. No rashes  Assessment & Plan:  Please see problem-based charting for assessment and plan.  Lorraine Ohara, MD Resident Physician, PGY-1 Department of Internal Medicine [facility]

## 2015-06-24 NOTE — Assessment & Plan Note (Signed)
Presented to the ED 5 days ago with productive cough and URI symptoms x 1 week, no fever. CXR showed lingular consolidation. Pt given azithro x 5 days and refilled albuterol inhaler as pt had some wheezes as well on exam. Over the past 5 days, she says she is overall improving, but still has a cough productive of white sputum (vs greenish/brown previously) and some chest congestion. She is using the albuterol ~2 times per day currently. Of note, patient is immunocompromised on enbrel. She finished abx yesterday. -Will continue supportive care with albuterol -Mucinex -Instructed patient to call us ASAP if symptoms don't continue to improve -Follow-up in 3 weeks for repeat CXR, sooner if needed

## 2015-06-24 NOTE — Patient Instructions (Signed)
-  continue to use the albuterol inhaler as needed if you have wheezing  -use the mucinex as instructed to help you clear mucus  -it may take another 1-2 weeks for you to feel better, but if you start to get worse again, please call our clinic so we can see you soon  Thanks, Blane Ohara

## 2015-06-25 NOTE — Progress Notes (Signed)
Internal Medicine Clinic Attending  I saw and evaluated the patient.  I personally confirmed the key portions of the history and exam documented by Dr. Kennedy and I reviewed pertinent patient test results.  The assessment, diagnosis, and plan were formulated together and I agree with the documentation in the resident's note.  

## 2015-07-15 ENCOUNTER — Ambulatory Visit (INDEPENDENT_AMBULATORY_CARE_PROVIDER_SITE_OTHER): Payer: Medicaid Other | Admitting: Internal Medicine

## 2015-07-15 ENCOUNTER — Encounter: Payer: Self-pay | Admitting: Internal Medicine

## 2015-07-15 ENCOUNTER — Ambulatory Visit (HOSPITAL_COMMUNITY)
Admission: RE | Admit: 2015-07-15 | Discharge: 2015-07-15 | Disposition: A | Discharge status: Home / Self Care | Payer: Medicaid Other | Source: Ambulatory Visit | Attending: Student in an Organized Health Care Education/Training Program | Admitting: Student in an Organized Health Care Education/Training Program

## 2015-07-15 VITALS — BP 126/77 | HR 55 | Temp 98.3°F | Ht 63.0 in | Wt 140.5 lb

## 2015-07-15 DIAGNOSIS — J189 Pneumonia, unspecified organism: Secondary | ICD-10-CM | POA: Diagnosis present

## 2015-07-15 DIAGNOSIS — F17200 Nicotine dependence, unspecified, uncomplicated: Secondary | ICD-10-CM | POA: Insufficient documentation

## 2015-07-15 DIAGNOSIS — Z0000 Encounter for general adult medical examination without abnormal findings: Secondary | ICD-10-CM

## 2015-07-15 MED ORDER — FLUTICASONE PROPIONATE 50 MCG/ACT NA SUSP
2.0000 | Freq: Every day | NASAL | Status: DC
Start: 2015-07-15 — End: 2015-08-19

## 2015-07-15 MED ORDER — CETIRIZINE HCL 10 MG PO CAPS: 1.0000 | ORAL_CAPSULE | Freq: Every day | ORAL | Status: DC

## 2015-07-15 MED ORDER — SALINE SPRAY 0.65 % NA SOLN: 1.0000 | NASAL | Status: DC | PRN

## 2015-07-15 MED ORDER — ALBUTEROL SULFATE HFA 108 (90 BASE) MCG/ACT IN AERS: 1.0000 | INHALATION_SPRAY | Freq: Four times a day (QID) | RESPIRATORY_TRACT | Status: DC | PRN

## 2015-07-15 NOTE — Progress Notes (Signed)
Internal Medicine Clinic Attending  I saw and evaluated the patient.  I personally confirmed the key portions of the history and exam documented by Dr. Benjamine Mola and I reviewed pertinent patient test results.  The assessment, diagnosis, and plan were formulated together and I agree with the documentation in the resident's note.  Recovering well from community acquired pneumonia. No need for further antibiotics. Prescribed fluticasone intranasal from congestion. Repeat chest xray to ensure infiltrate resolution.

## 2015-07-15 NOTE — Assessment & Plan Note (Signed)
Patient still needs flu vaccine for this year, however we will defer it today due to her ongoing illness with recent community acquired pneumonia evaluated at the hospital. Do recommend her to get this shot within the next few weeks assuming her pneumonia is resolved on repeat chest x-ray and his symptoms continue to improve.

## 2015-07-15 NOTE — Assessment & Plan Note (Signed)
Assessment: She was treated at [facility] ED for any acquired pneumonia a month ago and prescribed azithromycin plus supportive care with albuterol. Her risk factors are history of long-term mild tobacco use without known reactive for structural airway disease plus immune impression on Enbrel for rheumatoid arthritis. Her shortness of breath episodes are improving and on physical exam her lung fields are clear throughout. She does not have any more fevers or chills. She does have a significant amount of mucus production and cough. However with posterior oropharyngeal erythema, clear sinus drainage, sinus tenderness to palpation, mild tenderness these seem very consistent with upper airway disease, whether due to allergen exposure or viral infection.  Plan: Obtain chest x-ray today to confirm resolution of left lower lobe pneumonia Refill albuterol inhaler Start daily oral antihistamine Start daily intranasal steroid Recommend nasal saline rinse and Mucinex as needed
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
## 2015-07-15 NOTE — Patient Instructions (Signed)
Your continued symptoms are most likely related to inflammation in your sinuses and upper airway and your pneumonia seems to be improved.   We recommend taking an over-the-counter antihistamine such as Zyrtec, Claritin, or Allegra once daily to decrease your inflammation and mucus production.  Also recommend using a Flonase(fluticasone) intranasal steroid in both nostrils daily.  Additionally you make give symptomatic relief with a simply saline, ocean, or other brand nasal saline rinse to be used as needed or before doing intranasal steroid spray.  Your repeat chest x-ray today should most likely be improved we will call you if there are any findings indicating a change of plans.

## 2015-07-30 ENCOUNTER — Other Ambulatory Visit: Payer: Self-pay | Admitting: Internal Medicine

## 2015-07-30 ENCOUNTER — Telehealth: Payer: Self-pay | Admitting: Internal Medicine

## 2015-07-30 DIAGNOSIS — B379 Candidiasis, unspecified: Secondary | ICD-10-CM

## 2015-07-30 MED ORDER — FLUCONAZOLE 150 MG PO TABS: 150.0000 mg | ORAL_TABLET | Freq: Every day | ORAL | Status: DC

## 2015-07-30 NOTE — Telephone Encounter (Signed)
Pt states she is currently taking antibiotic and now having yeast infection. Pt requesting med for yeast infection, please call pt back.

## 2015-07-30 NOTE — Telephone Encounter (Signed)
Has a vag yeast  infection from antibiotics - usually uses Diflucan.  Hilda Blades Ameliah Baskins RN 07/30/15 10:45AM

## 2015-08-01 NOTE — Telephone Encounter (Signed)
Sent rx in on 12/22.   Julious Oka, MD Internal Medicine Resident, PGY [place] [facility] Pager: [phone]

## 2015-08-07 ENCOUNTER — Encounter (HOSPITAL_COMMUNITY): Payer: Self-pay | Admitting: Oncology

## 2015-08-07 ENCOUNTER — Emergency Department (HOSPITAL_COMMUNITY)
Admission: EM | Admit: 2015-08-07 | Discharge: 2015-08-08 | Disposition: A | Discharge status: Home / Self Care | Payer: Medicaid Other | Attending: Emergency Medicine | Admitting: Emergency Medicine

## 2015-08-07 DIAGNOSIS — Z79899 Other long term (current) drug therapy: Secondary | ICD-10-CM | POA: Insufficient documentation

## 2015-08-07 DIAGNOSIS — F17210 Nicotine dependence, cigarettes, uncomplicated: Secondary | ICD-10-CM | POA: Diagnosis not present

## 2015-08-07 DIAGNOSIS — R739 Hyperglycemia, unspecified: Secondary | ICD-10-CM

## 2015-08-07 DIAGNOSIS — E1165 Type 2 diabetes mellitus with hyperglycemia: Secondary | ICD-10-CM | POA: Diagnosis not present

## 2015-08-07 DIAGNOSIS — Z8719 Personal history of other diseases of the digestive system: Secondary | ICD-10-CM | POA: Insufficient documentation

## 2015-08-07 DIAGNOSIS — Z8619 Personal history of other infectious and parasitic diseases: Secondary | ICD-10-CM | POA: Insufficient documentation

## 2015-08-07 DIAGNOSIS — I10 Essential (primary) hypertension: Secondary | ICD-10-CM | POA: Insufficient documentation

## 2015-08-07 DIAGNOSIS — Z8611 Personal history of tuberculosis: Secondary | ICD-10-CM | POA: Insufficient documentation

## 2015-08-07 DIAGNOSIS — Z8742 Personal history of other diseases of the female genital tract: Secondary | ICD-10-CM | POA: Diagnosis not present

## 2015-08-07 DIAGNOSIS — M1990 Unspecified osteoarthritis, unspecified site: Secondary | ICD-10-CM | POA: Diagnosis not present

## 2015-08-07 DIAGNOSIS — R631 Polydipsia: Secondary | ICD-10-CM | POA: Diagnosis present

## 2015-08-07 DIAGNOSIS — Z7951 Long term (current) use of inhaled steroids: Secondary | ICD-10-CM | POA: Diagnosis not present

## 2015-08-07 LAB — CBC
HEMATOCRIT: 43.4 % (ref 36.0–46.0)
Hemoglobin: 15.3 g/dL — ABNORMAL HIGH (ref 12.0–15.0)
MCH: 31.2 pg (ref 26.0–34.0)
MCHC: 35.3 g/dL (ref 30.0–36.0)
MCV: 88.4 fL (ref 78.0–100.0)
PLATELETS: 262 10*3/uL (ref 150–400)
RBC: 4.91 MIL/uL (ref 3.87–5.11)
RDW: 12.7 % (ref 11.5–15.5)
WBC: 7 10*3/uL (ref 4.0–10.5)

## 2015-08-07 LAB — BASIC METABOLIC PANEL
Anion gap: 10 (ref 5–15)
BUN: 9 mg/dL (ref 6–20)
CHLORIDE: 96 mmol/L — AB (ref 101–111)
CO2: 28 mmol/L (ref 22–32)
CREATININE: 0.74 mg/dL (ref 0.44–1.00)
Calcium: 9.8 mg/dL (ref 8.9–10.3)
GFR calc Af Amer: >60 mL/min (ref >60)
GFR calc non Af Amer: >60 mL/min (ref >60)
Glucose, Bld: 524 mg/dL — ABNORMAL HIGH (ref 65–99)
POTASSIUM: 3.8 mmol/L (ref 3.5–5.1)
Sodium: 134 mmol/L — ABNORMAL LOW (ref 135–145)

## 2015-08-07 LAB — URINALYSIS, ROUTINE W REFLEX MICROSCOPIC
Bilirubin Urine: NEGATIVE
HGB URINE DIPSTICK: NEGATIVE
KETONES UR: 15 mg/dL — AB
Leukocytes, UA: NEGATIVE
Nitrite: NEGATIVE
PH: 5.5 (ref 5.0–8.0)
PROTEIN: NEGATIVE mg/dL
Specific Gravity, Urine: 1.046 — ABNORMAL HIGH (ref 1.005–1.030)

## 2015-08-07 LAB — CBG MONITORING, ED
Glucose-Capillary: 491 mg/dL — ABNORMAL HIGH (ref 65–99)
Glucose-Capillary: 419 mg/dL — ABNORMAL HIGH (ref 65–99)

## 2015-08-07 LAB — URINE MICROSCOPIC-ADD ON

## 2015-08-07 MED ORDER — SODIUM CHLORIDE 0.9 % IV BOLUS (SEPSIS)
2000.0000 mL | Freq: Once | INTRAVENOUS | Status: AC
  Administered 2015-08-07: 2000 mL via INTRAVENOUS

## 2015-08-07 MED ORDER — METFORMIN HCL 500 MG PO TABS
1000.0000 mg | ORAL_TABLET | Freq: Once | ORAL | Status: AC
  Administered 2015-08-07: 1000 mg via ORAL
  Filled 2015-08-07: qty 2

## 2015-08-07 NOTE — ED Notes (Signed)
Per pt she was taken of diabetic medications a year ago.  Pt reports extreme thirst x 2 days.  +dizziness, +nausea.  Pt reports 9/10 HA.
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
## 2015-08-08 ENCOUNTER — Emergency Department (HOSPITAL_COMMUNITY)
Admission: EM | Admit: 2015-08-08 | Discharge: 2015-08-08 | Disposition: A | Discharge status: Home / Self Care | Payer: Medicaid Other | Source: Home / Self Care | Attending: Emergency Medicine | Admitting: Emergency Medicine

## 2015-08-08 ENCOUNTER — Encounter (HOSPITAL_COMMUNITY): Payer: Self-pay | Admitting: Emergency Medicine

## 2015-08-08 DIAGNOSIS — K219 Gastro-esophageal reflux disease without esophagitis: Secondary | ICD-10-CM | POA: Insufficient documentation

## 2015-08-08 DIAGNOSIS — I10 Essential (primary) hypertension: Secondary | ICD-10-CM | POA: Insufficient documentation

## 2015-08-08 DIAGNOSIS — Z7951 Long term (current) use of inhaled steroids: Secondary | ICD-10-CM | POA: Insufficient documentation

## 2015-08-08 DIAGNOSIS — Z8742 Personal history of other diseases of the female genital tract: Secondary | ICD-10-CM

## 2015-08-08 DIAGNOSIS — Z8619 Personal history of other infectious and parasitic diseases: Secondary | ICD-10-CM | POA: Insufficient documentation

## 2015-08-08 DIAGNOSIS — R739 Hyperglycemia, unspecified: Secondary | ICD-10-CM

## 2015-08-08 DIAGNOSIS — E1165 Type 2 diabetes mellitus with hyperglycemia: Secondary | ICD-10-CM | POA: Insufficient documentation

## 2015-08-08 DIAGNOSIS — Z8611 Personal history of tuberculosis: Secondary | ICD-10-CM

## 2015-08-08 DIAGNOSIS — Z79899 Other long term (current) drug therapy: Secondary | ICD-10-CM

## 2015-08-08 DIAGNOSIS — Z8739 Personal history of other diseases of the musculoskeletal system and connective tissue: Secondary | ICD-10-CM | POA: Insufficient documentation

## 2015-08-08 DIAGNOSIS — F17210 Nicotine dependence, cigarettes, uncomplicated: Secondary | ICD-10-CM | POA: Insufficient documentation

## 2015-08-08 LAB — URINALYSIS, ROUTINE W REFLEX MICROSCOPIC
Bilirubin Urine: NEGATIVE
HGB URINE DIPSTICK: NEGATIVE
KETONES UR: 15 mg/dL — AB
LEUKOCYTES UA: NEGATIVE
Nitrite: NEGATIVE
PH: 5.5 (ref 5.0–8.0)
Protein, ur: NEGATIVE mg/dL
Specific Gravity, Urine: 1.03 (ref 1.005–1.030)

## 2015-08-08 LAB — CBG MONITORING, ED
GLUCOSE-CAPILLARY: 292 mg/dL — AB (ref 65–99)
GLUCOSE-CAPILLARY: 406 mg/dL — AB (ref 65–99)
Glucose-Capillary: 336 mg/dL — ABNORMAL HIGH (ref 65–99)
Glucose-Capillary: 280 mg/dL — ABNORMAL HIGH (ref 65–99)
Glucose-Capillary: 127 mg/dL — ABNORMAL HIGH (ref 65–99)

## 2015-08-08 LAB — BASIC METABOLIC PANEL
Anion gap: 10 (ref 5–15)
BUN: 9 mg/dL (ref 6–20)
CHLORIDE: 102 mmol/L (ref 101–111)
CO2: 23 mmol/L (ref 22–32)
CREATININE: 0.62 mg/dL (ref 0.44–1.00)
Calcium: 9 mg/dL (ref 8.9–10.3)
GFR calc Af Amer: >60 mL/min (ref >60)
GFR calc non Af Amer: >60 mL/min (ref >60)
GLUCOSE: 399 mg/dL — AB (ref 65–99)
POTASSIUM: 3.6 mmol/L (ref 3.5–5.1)
SODIUM: 135 mmol/L (ref 135–145)

## 2015-08-08 LAB — URINE MICROSCOPIC-ADD ON
Bacteria, UA: NONE SEEN
RBC / HPF: NONE SEEN RBC/hpf (ref 0–5)
WBC, UA: NONE SEEN WBC/hpf (ref 0–5)

## 2015-08-08 LAB — CBC
HEMATOCRIT: 39.4 % (ref 36.0–46.0)
Hemoglobin: 14 g/dL (ref 12.0–15.0)
MCH: 31.5 pg (ref 26.0–34.0)
MCHC: 35.5 g/dL (ref 30.0–36.0)
MCV: 88.5 fL (ref 78.0–100.0)
Platelets: 228 10*3/uL (ref 150–400)
RBC: 4.45 MIL/uL (ref 3.87–5.11)
RDW: 12.8 % (ref 11.5–15.5)
WBC: 5.7 10*3/uL (ref 4.0–10.5)

## 2015-08-08 MED ORDER — METFORMIN HCL 500 MG PO TABS: 500.0000 mg | ORAL_TABLET | Freq: Two times a day (BID) | ORAL | Status: DC

## 2015-08-08 MED ORDER — SITAGLIPTIN PHOSPHATE 100 MG PO TABS: 100.0000 mg | ORAL_TABLET | Freq: Every day | ORAL | Status: DC

## 2015-08-08 MED ORDER — INSULIN ASPART 100 UNIT/ML ~~LOC~~ SOLN
10.0000 [IU] | Freq: Once | SUBCUTANEOUS | Status: AC
  Administered 2015-08-08: 10 [IU] via INTRAVENOUS
  Filled 2015-08-08: qty 1

## 2015-08-08 MED ORDER — SODIUM CHLORIDE 0.9 % IV BOLUS (SEPSIS)
1000.0000 mL | Freq: Once | INTRAVENOUS | Status: AC
  Administered 2015-08-08: 1000 mL via INTRAVENOUS

## 2015-08-08 MED ORDER — INSULIN ASPART 100 UNIT/ML ~~LOC~~ SOLN
3.0000 [IU] | Freq: Once | SUBCUTANEOUS | Status: AC
  Administered 2015-08-08: 3 [IU] via SUBCUTANEOUS
  Filled 2015-08-08: qty 1

## 2015-08-08 NOTE — ED Notes (Addendum)
Pt reported that she doesn't feel good with polyuria, thirsty and her "sugar is high". Pt stated that she took only 1 Metformin and it made her CBG elevated.

## 2015-08-08 NOTE — ED Notes (Signed)
Awake. Verbally responsive. A/O x4. Resp even and unlabored. No audible adventitious breath sounds noted. ABC's intact. IV infusing NS at 999ml/hr without difficulty. 

## 2015-08-08 NOTE — ED Notes (Signed)
Pt states she was seen in ED yesterday for hyperglycemia. She was rx'ed Metformin and she took one at 11am today and states now she feels worse. Blood sugar 406 mg/dl in triage. Pt states she is nauseous and has a headache. Denies vomiting and diarrhea. Pt alert, no acute distress. Rates headache 8/10.
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
## 2015-08-08 NOTE — Discharge Instructions (Signed)
I have discussed her care with the pharmacist who has recommended the following medication, he may stop taking the Glucophage  Januvia 100mg  by mouth once daily  U must follow-up with your family doctor within 2 weeks for a recheck.   Please obtain all of your results from medical records or have your doctors office obtain the results - share them with your doctor - you should be seen at your doctors office in the next 2 days. Call today to arrange your follow up. Take the medications as prescribed. Please review all of the medicines and only take them if you do not have an allergy to them. Please be aware that if you are taking birth control pills, taking other prescriptions, ESPECIALLY ANTIBIOTICS may make the birth control ineffective - if this is the case, either do not engage in sexual activity or use alternative methods of birth control such as condoms until you have finished the medicine and your family doctor says it is OK to restart them. If you are on a blood thinner such as COUMADIN, be aware that any other medicine that you take may cause the coumadin to either work too much, or not enough - you should have your coumadin level rechecked in next 7 days if this is the case.  ?  It is also a possibility that you have an allergic reaction to any of the medicines that you have been prescribed - Everybody reacts differently to medications and while MOST people have no trouble with most medicines, you may have a reaction such as nausea, vomiting, rash, swelling, shortness of breath. If this is the case, please stop taking the medicine immediately and contact your physician.  ?  You should return to the ER if you develop severe or worsening symptoms.

## 2015-08-08 NOTE — Discharge Instructions (Signed)
Blood Glucose Monitoring, Adult ° °Monitoring your blood glucose (also know as blood sugar) helps you to manage your diabetes. It also helps you and your health care provider monitor your diabetes and determine how well your treatment plan is working. °WHY SHOULD YOU MONITOR YOUR BLOOD GLUCOSE? °· It can help you understand how food, exercise, and medicine affect your blood glucose. °· It allows you to know what your blood glucose is at any given moment. You can quickly tell if you are having low blood glucose (hypoglycemia) or high blood glucose (hyperglycemia). °· It can help you and your health care provider know how to adjust your medicines. °· It can help you understand how to manage an illness or adjust medicine for exercise. °WHEN SHOULD YOU TEST? °Your health care provider will help you decide how often you should check your blood glucose. This may depend on the type of diabetes you have, your diabetes control, or the types of medicines you are taking. Be sure to write down all of your blood glucose readings so that this information can be reviewed with your health care provider. See below for examples of testing times that your health care provider may suggest. °Type 1 Diabetes °· Test at least 2 times per day if your diabetes is well controlled, if you are using an insulin pump, or if you perform multiple daily injections. °· If your diabetes is not well controlled or if you are sick, you may need to test more often. °· It is a good idea to also test: °¨ Before every insulin injection. °¨ Before and after exercise. °¨ Between meals and 2 hours after a meal. °¨ Occasionally between 2:00 a.m. and 3:00 a.m. °Type 2 Diabetes °· If you are taking insulin, test at least 2 times per day. However, it is best to test before every insulin injection. °· If you take medicines by mouth (orally), test 2 times a day. °· If you are on a controlled diet, test once a day. °· If your diabetes is not well controlled or if you  are sick, you may need to monitor more often. °HOW TO MONITOR YOUR BLOOD GLUCOSE °Supplies Needed °· Blood glucose meter. °· Test strips for your meter. Each meter has its own strips. You must use the strips that go with your own meter. °· A pricking needle (lancet). °· A device that holds the lancet (lancing device). °· A journal or log book to write down your results. °Procedure °· Wash your hands with soap and water. Alcohol is not preferred. °· Prick the side of your finger (not the tip) with the lancet. °· Gently milk the finger until a small drop of blood appears. °· Follow the instructions that come with your meter for inserting the test strip, applying blood to the strip, and using your blood glucose meter. °Other Areas to Get Blood for Testing °Some meters allow you to use other areas of your body (other than your finger) to test your blood. These areas are called alternative sites. The most common alternative sites are: °· The forearm. °· The thigh. °· The back area of the lower leg. °· The palm of the hand. °The blood flow in these areas is slower. Therefore, the blood glucose values you get may be delayed, and the numbers are different from what you would get from your fingers. Do not use alternative sites if you think you are having hypoglycemia. Your reading will not be accurate. Always use a finger if you   are having hypoglycemia. Also, if you cannot feel your lows (hypoglycemia unawareness), always use your fingers for your blood glucose checks. °ADDITIONAL TIPS FOR GLUCOSE MONITORING °· Do not reuse lancets. °· Always carry your supplies with you. °· All blood glucose meters have a 24-hour "hotline" number to call if you have questions or need help. °· Adjust (calibrate) your blood glucose meter with a control solution after finishing a few boxes of strips. °BLOOD GLUCOSE RECORD KEEPING °It is a good idea to keep a daily record or log of your blood glucose readings. Most glucose meters, if not all,  keep your glucose records stored in the meter. Some meters come with the ability to download your records to your home computer. Keeping a record of your blood glucose readings is especially helpful if you are wanting to look for patterns. Make notes to go along with the blood glucose readings because you might forget what happened at that exact time. Keeping good records helps you and your health care provider to work together to achieve good diabetes management.  °  °This information is not intended to replace advice given to you by your health care provider. Make sure you discuss any questions you have with your health care provider. °  °Document Released: 07/28/2003 Document Revised: 08/15/2014 Document Reviewed: 12/17/2012 °Elsevier Interactive Patient Education ©2016 [employer] ° °

## 2015-08-08 NOTE — ED Notes (Signed)
Awake. Verbally responsive. A/O x4. Resp even and unlabored. No audible adventitious breath sounds noted. ABC's intact. IV saline lock patent and intact. Pt ambulated to BR with steady gait.

## 2015-08-09 ENCOUNTER — Encounter (HOSPITAL_COMMUNITY): Payer: Self-pay | Admitting: Family Medicine

## 2015-08-09 ENCOUNTER — Emergency Department (HOSPITAL_COMMUNITY)
Admission: EM | Admit: 2015-08-09 | Discharge: 2015-08-09 | Disposition: A | Discharge status: Home / Self Care | Payer: Medicaid Other | Source: Home / Self Care | Attending: Emergency Medicine | Admitting: Emergency Medicine

## 2015-08-09 ENCOUNTER — Emergency Department (HOSPITAL_COMMUNITY)
Admission: EM | Admit: 2015-08-09 | Discharge: 2015-08-10 | Disposition: A | Discharge status: Home / Self Care | Payer: Medicaid Other | Source: Home / Self Care | Attending: Emergency Medicine | Admitting: Emergency Medicine

## 2015-08-09 ENCOUNTER — Encounter (HOSPITAL_COMMUNITY): Payer: Self-pay | Admitting: *Deleted

## 2015-08-09 DIAGNOSIS — I10 Essential (primary) hypertension: Secondary | ICD-10-CM

## 2015-08-09 DIAGNOSIS — F17210 Nicotine dependence, cigarettes, uncomplicated: Secondary | ICD-10-CM | POA: Insufficient documentation

## 2015-08-09 DIAGNOSIS — K219 Gastro-esophageal reflux disease without esophagitis: Secondary | ICD-10-CM

## 2015-08-09 DIAGNOSIS — E1165 Type 2 diabetes mellitus with hyperglycemia: Secondary | ICD-10-CM | POA: Insufficient documentation

## 2015-08-09 DIAGNOSIS — Z8619 Personal history of other infectious and parasitic diseases: Secondary | ICD-10-CM

## 2015-08-09 DIAGNOSIS — Z8742 Personal history of other diseases of the female genital tract: Secondary | ICD-10-CM

## 2015-08-09 DIAGNOSIS — Z7951 Long term (current) use of inhaled steroids: Secondary | ICD-10-CM

## 2015-08-09 DIAGNOSIS — Z8611 Personal history of tuberculosis: Secondary | ICD-10-CM | POA: Insufficient documentation

## 2015-08-09 DIAGNOSIS — R739 Hyperglycemia, unspecified: Secondary | ICD-10-CM

## 2015-08-09 DIAGNOSIS — Z79899 Other long term (current) drug therapy: Secondary | ICD-10-CM

## 2015-08-09 DIAGNOSIS — Z87828 Personal history of other (healed) physical injury and trauma: Secondary | ICD-10-CM | POA: Insufficient documentation

## 2015-08-09 DIAGNOSIS — R51 Headache: Secondary | ICD-10-CM

## 2015-08-09 DIAGNOSIS — Z8739 Personal history of other diseases of the musculoskeletal system and connective tissue: Secondary | ICD-10-CM | POA: Insufficient documentation

## 2015-08-09 LAB — BASIC METABOLIC PANEL
ANION GAP: 11 (ref 5–15)
BUN: 8 mg/dL (ref 6–20)
CHLORIDE: 100 mmol/L — AB (ref 101–111)
CO2: 24 mmol/L (ref 22–32)
Calcium: 9.3 mg/dL (ref 8.9–10.3)
Creatinine, Ser: 0.71 mg/dL (ref 0.44–1.00)
GFR calc Af Amer: >60 mL/min (ref >60)
GLUCOSE: 421 mg/dL — AB (ref 65–99)
POTASSIUM: 3.4 mmol/L — AB (ref 3.5–5.1)
SODIUM: 135 mmol/L (ref 135–145)

## 2015-08-09 LAB — CBG MONITORING, ED
GLUCOSE-CAPILLARY: 396 mg/dL — AB (ref 65–99)
Glucose-Capillary: 420 mg/dL — ABNORMAL HIGH (ref 65–99)
Glucose-Capillary: 314 mg/dL — ABNORMAL HIGH (ref 65–99)

## 2015-08-09 LAB — CBC
HEMATOCRIT: 40.5 % (ref 36.0–46.0)
HEMOGLOBIN: 14.4 g/dL (ref 12.0–15.0)
MCH: 31.4 pg (ref 26.0–34.0)
MCHC: 35.6 g/dL (ref 30.0–36.0)
MCV: 88.4 fL (ref 78.0–100.0)
Platelets: 218 10*3/uL (ref 150–400)
RBC: 4.58 MIL/uL (ref 3.87–5.11)
RDW: 12.8 % (ref 11.5–15.5)
WBC: 6.3 10*3/uL (ref 4.0–10.5)

## 2015-08-09 LAB — URINALYSIS, ROUTINE W REFLEX MICROSCOPIC
Bilirubin Urine: NEGATIVE
Hgb urine dipstick: NEGATIVE
KETONES UR: 15 mg/dL — AB
LEUKOCYTES UA: NEGATIVE
NITRITE: NEGATIVE
PH: 5 (ref 5.0–8.0)
Protein, ur: NEGATIVE mg/dL
SPECIFIC GRAVITY, URINE: 1.036 — AB (ref 1.005–1.030)

## 2015-08-09 LAB — URINE MICROSCOPIC-ADD ON

## 2015-08-09 MED ORDER — SODIUM CHLORIDE 0.9 % IV BOLUS (SEPSIS)
1000.0000 mL | Freq: Once | INTRAVENOUS | Status: AC
  Administered 2015-08-09: 1000 mL via INTRAVENOUS
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
## 2015-08-09 NOTE — ED Notes (Signed)
Patient reports she is experiencing hyperglycemia symptoms since Friday. Pt reports [facility] discontinued her diabetic medication about a year ago. Pt reports she went to [facility] earlier today. Given "a bag of fluid" and reports not having any insulin administer.

## 2015-08-09 NOTE — Discharge Instructions (Signed)
09/02/2015 1:45 PM Norman Herrlich, MD [place] [phone]     Follow with Dr. Hulen Luster at your appointment as listed above.  Continue to take your Januvia as directed.  Do not hesitate to return to the Emergency Department for any new, worsening or concerning symptoms.

## 2015-08-09 NOTE — ED Notes (Signed)
Pt reports hyperglycemia for several days. cbg 420 at triage. Reports nausea, blurred vision, headache and frequent urination.

## 2015-08-10 LAB — URINALYSIS, ROUTINE W REFLEX MICROSCOPIC
BILIRUBIN URINE: NEGATIVE
Glucose, UA: >1000 mg/dL — AB
HGB URINE DIPSTICK: NEGATIVE
KETONES UR: 40 mg/dL — AB
Leukocytes, UA: NEGATIVE
NITRITE: NEGATIVE
PH: 5.5 (ref 5.0–8.0)
Protein, ur: NEGATIVE mg/dL
Specific Gravity, Urine: 1.043 — ABNORMAL HIGH (ref 1.005–1.030)

## 2015-08-10 LAB — URINE MICROSCOPIC-ADD ON: RBC / HPF: NONE SEEN RBC/hpf (ref 0–5)

## 2015-08-10 LAB — CBC
HCT: 37.7 % (ref 36.0–46.0)
Hemoglobin: 13.6 g/dL (ref 12.0–15.0)
MCH: 32 pg (ref 26.0–34.0)
MCHC: 36.1 g/dL — AB (ref 30.0–36.0)
MCV: 88.7 fL (ref 78.0–100.0)
Platelets: 216 10*3/uL (ref 150–400)
RBC: 4.25 MIL/uL (ref 3.87–5.11)
RDW: 12.7 % (ref 11.5–15.5)
WBC: 6.2 10*3/uL (ref 4.0–10.5)

## 2015-08-10 LAB — BASIC METABOLIC PANEL
Anion gap: 9 (ref 5–15)
BUN: 9 mg/dL (ref 6–20)
CALCIUM: 9.1 mg/dL (ref 8.9–10.3)
CO2: 25 mmol/L (ref 22–32)
Chloride: 102 mmol/L (ref 101–111)
Creatinine, Ser: 0.73 mg/dL (ref 0.44–1.00)
GFR calc Af Amer: >60 mL/min (ref >60)
GLUCOSE: 391 mg/dL — AB (ref 65–99)
Potassium: 3.2 mmol/L — ABNORMAL LOW (ref 3.5–5.1)
Sodium: 136 mmol/L (ref 135–145)

## 2015-08-10 MED ORDER — POTASSIUM CHLORIDE CRYS ER 20 MEQ PO TBCR
40.0000 meq | EXTENDED_RELEASE_TABLET | Freq: Once | ORAL | Status: AC
  Administered 2015-08-10: 40 meq via ORAL
  Filled 2015-08-10: qty 2

## 2015-08-10 MED ORDER — IBUPROFEN 800 MG PO TABS
800.0000 mg | ORAL_TABLET | Freq: Once | ORAL | Status: AC
  Administered 2015-08-10: 800 mg via ORAL
  Filled 2015-08-10: qty 1

## 2015-08-10 NOTE — Discharge Instructions (Signed)
Continue to take Januvia for management of your diabetes. Check your sugars in the morning as this will be the most accurate estimation of where your sugars run on a daily basis. If you develop uncontrolled nausea or vomiting with rapid breathing or fever, check your sugar again as it may be too high. Return to the Emergency Department if this occurs. Follow-up with your primary care doctor for further evaluation of your symptoms and to recheck your sugar.  Hyperglycemia Hyperglycemia occurs when the glucose (sugar) in your blood is too high. Hyperglycemia can happen for many reasons, but it most often happens to people who do not know they have diabetes or are not managing their diabetes properly.  CAUSES  Whether you have diabetes or not, there are other causes of hyperglycemia. Hyperglycemia can occur when you have diabetes, but it can also occur in other situations that you might not be as aware of, such as: Diabetes  If you have diabetes and are having problems controlling your blood glucose, hyperglycemia could occur because of some of the following reasons:  Not following your meal plan.  Not taking your diabetes medications or not taking it properly.  Exercising less or doing less activity than you normally do.  Being sick. Pre-diabetes  This cannot be ignored. Before people develop Type 2 diabetes, they almost always have "pre-diabetes." This is when your blood glucose levels are higher than normal, but not yet high enough to be diagnosed as diabetes. Research has shown that some long-term damage to the body, especially the heart and circulatory system, may already be occurring during pre-diabetes. If you take action to manage your blood glucose when you have pre-diabetes, you may delay or prevent Type 2 diabetes from developing. Stress  If you have diabetes, you may be "diet" controlled or on oral medications or insulin to control your diabetes. However, you may find that your blood  glucose is higher than usual in the hospital whether you have diabetes or not. This is often referred to as "stress hyperglycemia." Stress can elevate your blood glucose. This happens because of hormones put out by the body during times of stress. If stress has been the cause of your high blood glucose, it can be followed regularly by your caregiver. That way he/she can make sure your hyperglycemia does not continue to get worse or progress to diabetes. Steroids  Steroids are medications that act on the infection fighting system (immune system) to block inflammation or infection. One side effect can be a rise in blood glucose. Most people can produce enough extra insulin to allow for this rise, but for those who cannot, steroids make blood glucose levels go even higher. It is not unusual for steroid treatments to "uncover" diabetes that is developing. It is not always possible to determine if the hyperglycemia will go away after the steroids are stopped. A special blood test called an A1c is sometimes done to determine if your blood glucose was elevated before the steroids were started. SYMPTOMS  Thirsty.  Frequent urination.  Dry mouth.  Blurred vision.  Tired or fatigue.  Weakness.  Sleepy.  Tingling in feet or leg. DIAGNOSIS  Diagnosis is made by monitoring blood glucose in one or all of the following ways:  A1c test. This is a chemical found in your blood.  Fingerstick blood glucose monitoring.  Laboratory results. TREATMENT  First, knowing the cause of the hyperglycemia is important before the hyperglycemia can be treated. Treatment may include, but is not be  limited to:  Education.  Change or adjustment in medications.  Change or adjustment in meal plan.  Treatment for an illness, infection, etc.  More frequent blood glucose monitoring.  Change in exercise plan.  Decreasing or stopping steroids.  Lifestyle changes. HOME CARE INSTRUCTIONS   Test your blood  glucose as directed.  Exercise regularly. Your caregiver will give you instructions about exercise. Pre-diabetes or diabetes which comes on with stress is helped by exercising.  Eat wholesome, balanced meals. Eat often and at regular, fixed times. Your caregiver or nutritionist will give you a meal plan to guide your sugar intake.  Being at an ideal weight is important. If needed, losing as little as 10 to 15 pounds may help improve blood glucose levels. SEEK MEDICAL CARE IF:   You have questions about medicine, activity, or diet.  You continue to have symptoms (problems such as increased thirst, urination, or weight gain). SEEK IMMEDIATE MEDICAL CARE IF:   You are vomiting or have diarrhea.  Your breath smells fruity.  You are breathing faster or slower.  You are very sleepy or incoherent.  You have numbness, tingling, or pain in your feet or hands.  You have chest pain.  Your symptoms get worse even though you have been following your caregiver's orders.  If you have any other questions or concerns.   This information is not intended to replace advice given to you by your health care provider. Make sure you discuss any questions you have with your health care provider.   Document Released: 01/18/2001 Document Revised: 10/17/2011 Document Reviewed: 03/31/2015 Elsevier Interactive Patient Education 2016 [employer]. Diabetes Mellitus and Food It is important for you to manage your blood sugar (glucose) level. Your blood glucose level can be greatly affected by what you eat. Eating healthier foods in the appropriate amounts throughout the day at about the same time each day will help you control your blood glucose level. It can also help slow or prevent worsening of your diabetes mellitus. Healthy eating may even help you improve the level of your blood pressure and reach or maintain a healthy weight.  General recommendations for healthful eating and cooking habits  include:  Eating meals and snacks regularly. Avoid going long periods of time without eating to lose weight.  Eating a diet that consists mainly of plant-based foods, such as fruits, vegetables, nuts, legumes, and whole grains.  Using low-heat cooking methods, such as baking, instead of high-heat cooking methods, such as deep frying. Work with your dietitian to make sure you understand how to use the Nutrition Facts information on food labels. HOW CAN FOOD AFFECT ME? Carbohydrates Carbohydrates affect your blood glucose level more than any other type of food. Your dietitian will help you determine how many carbohydrates to eat at each meal and teach you how to count carbohydrates. Counting carbohydrates is important to keep your blood glucose at a healthy level, especially if you are using insulin or taking certain medicines for diabetes mellitus. Alcohol Alcohol can cause sudden decreases in blood glucose (hypoglycemia), especially if you use insulin or take certain medicines for diabetes mellitus. Hypoglycemia can be a life-threatening condition. Symptoms of hypoglycemia (sleepiness, dizziness, and disorientation) are similar to symptoms of having too much alcohol.  If your health care provider has given you approval to drink alcohol, do so in moderation and use the following guidelines:  Women should not have more than one drink per day, and men should not have more than two drinks per  day. One drink is equal to:  12 oz of beer.  5 oz of wine.  1 oz of hard liquor.  Do not drink on an empty stomach.  Keep yourself hydrated. Have water, diet soda, or unsweetened iced tea.  Regular soda, juice, and other mixers might contain a lot of carbohydrates and should be counted. WHAT FOODS ARE NOT RECOMMENDED? As you make food choices, it is important to remember that all foods are not the same. Some foods have fewer nutrients per serving than other foods, even though they might have the same  number of calories or carbohydrates. It is difficult to get your body what it needs when you eat foods with fewer nutrients. Examples of foods that you should avoid that are high in calories and carbohydrates but low in nutrients include:  Trans fats (most processed foods list trans fats on the Nutrition Facts label).  Regular soda.  Juice.  Candy.  Sweets, such as cake, pie, doughnuts, and cookies.  Fried foods. WHAT FOODS CAN I EAT? Eat nutrient-rich foods, which will nourish your body and keep you healthy. The food you should eat also will depend on several factors, including:  The calories you need.  The medicines you take.  Your weight.  Your blood glucose level.  Your blood pressure level.  Your cholesterol level. You should eat a variety of foods, including:  Protein.  Lean cuts of meat.  Proteins low in saturated fats, such as fish, egg whites, and beans. Avoid processed meats.  Fruits and vegetables.  Fruits and vegetables that may help control blood glucose levels, such as apples, mangoes, and yams.  Dairy products.  Choose fat-free or low-fat dairy products, such as milk, yogurt, and cheese.  Grains, bread, pasta, and rice.  Choose whole grain products, such as multigrain bread, whole oats, and brown rice. These foods may help control blood pressure.  Fats.  Foods containing healthful fats, such as nuts, avocado, olive oil, canola oil, and fish. DOES EVERYONE WITH DIABETES MELLITUS HAVE THE SAME MEAL PLAN? Because every person with diabetes mellitus is different, there is not one meal plan that works for everyone. It is very important that you meet with a dietitian who will help you create a meal plan that is just right for you.   This information is not intended to replace advice given to you by your health care provider. Make sure you discuss any questions you have with your health care provider.   Document Released: 04/21/2005 Document Revised:  08/15/2014 Document Reviewed: 06/21/2013 Elsevier Interactive Patient Education [employer].
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
## 2015-08-10 NOTE — ED Notes (Signed)
Pt states "I am ready to go"; pt refused to take jacket off for VS recheck

## 2015-08-12 ENCOUNTER — Encounter: Payer: Self-pay | Admitting: Pulmonary Disease

## 2015-08-12 ENCOUNTER — Ambulatory Visit (INDEPENDENT_AMBULATORY_CARE_PROVIDER_SITE_OTHER): Payer: Medicaid Other | Admitting: Pulmonary Disease

## 2015-08-12 VITALS — BP 98/73 | HR 81 | Temp 98.2°F | Ht 63.0 in | Wt 135.2 lb

## 2015-08-12 DIAGNOSIS — Z23 Encounter for immunization: Secondary | ICD-10-CM

## 2015-08-12 DIAGNOSIS — E1165 Type 2 diabetes mellitus with hyperglycemia: Secondary | ICD-10-CM

## 2015-08-12 DIAGNOSIS — I10 Essential (primary) hypertension: Secondary | ICD-10-CM

## 2015-08-12 LAB — GLUCOSE, CAPILLARY: Glucose-Capillary: 314 mg/dL — ABNORMAL HIGH (ref 65–99)

## 2015-08-12 LAB — POCT GLYCOSYLATED HEMOGLOBIN (HGB A1C): HEMOGLOBIN A1C: 9.4

## 2015-08-12 MED ORDER — METFORMIN HCL 500 MG PO TABS: ORAL_TABLET | ORAL | Status: DC

## 2015-08-12 NOTE — Progress Notes (Signed)
Internal Medicine Clinic Attending  Case discussed with Dr. Krall at the time of the visit.  We reviewed the resident's history and exam and pertinent patient test results.  I agree with the assessment, diagnosis, and plan of care documented in the resident's note.  

## 2015-08-12 NOTE — Patient Instructions (Signed)
Metformin  WEEK 1: Take 1 pill with supper every day WEEK 2: Take 1 pill with breakfast and 1 pill with supper every day WEEK 3: Take 1 pill with breakfast and 2 pills with supper every day WEEK 4: Take 2 pills with breakfast and 2 pills with supper every day
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
## 2015-08-12 NOTE — Assessment & Plan Note (Signed)
Lab Results  Component Value Date   HGBA1C 9.4 08/12/2015   HGBA1C 6.3 04/16/2015   HGBA1C 6.6 12/08/2014     Assessment: Diabetes control: poor control (HgbA1C >9%) Progress toward A1C goal:  deteriorated  I suspect that Lorraine [name] did not tolerate metformin in the ED because Lorraine [name] was started on higher dose.  Plan: Medications:  Continue Januvia 100 mg daily. Start metformin 500 mg daily with supper. After one week increase to 500 mg twice a day with meals. The following week, increase to 500 mg daily with breakfast and 1000 mg daily with supper. The following week, increase to 1000 mg daily with breakfast and 1000 mg daily with supper. Educational resources provided: other (see comments) (denies.  Spoke with Diabetes Educator about foods  to eat.) Self management tools provided: copy of home glucose meter download Other plans:  -If Lorraine [name] continues to have trouble tolerating metformin, would consider starting insulin therapy. -Follow-up in 2 weeks

## 2015-08-12 NOTE — Assessment & Plan Note (Signed)
BP Readings from Last 3 Encounters:  08/12/15 98/73  08/09/15 160/93  08/09/15 138/88    Lab Results  Component Value Date   NA 136 08/09/2015   K 3.2* 08/09/2015   CREATININE 0.73 08/09/2015    Assessment: Blood pressure control: controlled Progress toward BP goal:  at goal Comments: Mildly hypotensive this morning. I discussed with her holding her blood pressure medication for now given that she has had complaints of lightheadedness recently.  Plan: Medications:  Hold Iisinopril-hydrochlorothiazide 20-25 mg daily. -Follow-up in 2 weeks. If blood pressure above goal, which consider restarting lisinopril.

## 2015-08-16 ENCOUNTER — Encounter (HOSPITAL_COMMUNITY): Payer: Self-pay | Admitting: *Deleted

## 2015-08-16 ENCOUNTER — Emergency Department (HOSPITAL_COMMUNITY)
Admission: EM | Admit: 2015-08-16 | Discharge: 2015-08-17 | Disposition: A | Discharge status: Home / Self Care | Payer: Medicaid Other | Attending: Emergency Medicine | Admitting: Emergency Medicine

## 2015-08-16 DIAGNOSIS — K219 Gastro-esophageal reflux disease without esophagitis: Secondary | ICD-10-CM | POA: Insufficient documentation

## 2015-08-16 DIAGNOSIS — Z7984 Long term (current) use of oral hypoglycemic drugs: Secondary | ICD-10-CM | POA: Insufficient documentation

## 2015-08-16 DIAGNOSIS — Z79899 Other long term (current) drug therapy: Secondary | ICD-10-CM | POA: Diagnosis not present

## 2015-08-16 DIAGNOSIS — R739 Hyperglycemia, unspecified: Secondary | ICD-10-CM

## 2015-08-16 DIAGNOSIS — Z8739 Personal history of other diseases of the musculoskeletal system and connective tissue: Secondary | ICD-10-CM | POA: Insufficient documentation

## 2015-08-16 DIAGNOSIS — Z8742 Personal history of other diseases of the female genital tract: Secondary | ICD-10-CM | POA: Diagnosis not present

## 2015-08-16 DIAGNOSIS — F17210 Nicotine dependence, cigarettes, uncomplicated: Secondary | ICD-10-CM | POA: Insufficient documentation

## 2015-08-16 DIAGNOSIS — Z8619 Personal history of other infectious and parasitic diseases: Secondary | ICD-10-CM | POA: Insufficient documentation

## 2015-08-16 DIAGNOSIS — Z8611 Personal history of tuberculosis: Secondary | ICD-10-CM | POA: Insufficient documentation

## 2015-08-16 DIAGNOSIS — R1013 Epigastric pain: Secondary | ICD-10-CM | POA: Diagnosis not present

## 2015-08-16 DIAGNOSIS — I10 Essential (primary) hypertension: Secondary | ICD-10-CM | POA: Insufficient documentation

## 2015-08-16 DIAGNOSIS — E1165 Type 2 diabetes mellitus with hyperglycemia: Secondary | ICD-10-CM | POA: Diagnosis present

## 2015-08-16 LAB — BASIC METABOLIC PANEL
ANION GAP: 12 (ref 5–15)
BUN: 14 mg/dL (ref 6–20)
CALCIUM: 9.5 mg/dL (ref 8.9–10.3)
CO2: 26 mmol/L (ref 22–32)
CREATININE: 0.7 mg/dL (ref 0.44–1.00)
Chloride: 91 mmol/L — ABNORMAL LOW (ref 101–111)
Glucose, Bld: 412 mg/dL — ABNORMAL HIGH (ref 65–99)
Potassium: 3.5 mmol/L (ref 3.5–5.1)
SODIUM: 129 mmol/L — AB (ref 135–145)

## 2015-08-16 LAB — CBG MONITORING, ED: GLUCOSE-CAPILLARY: 373 mg/dL — AB (ref 65–99)

## 2015-08-16 LAB — CBC
HCT: 42.9 % (ref 36.0–46.0)
HEMOGLOBIN: 15.1 g/dL — AB (ref 12.0–15.0)
MCH: 31.1 pg (ref 26.0–34.0)
MCHC: 35.2 g/dL (ref 30.0–36.0)
MCV: 88.5 fL (ref 78.0–100.0)
PLATELETS: 328 10*3/uL (ref 150–400)
RBC: 4.85 MIL/uL (ref 3.87–5.11)
RDW: 12.4 % (ref 11.5–15.5)
WBC: 7.9 10*3/uL (ref 4.0–10.5)

## 2015-08-16 MED ORDER — SODIUM CHLORIDE 0.9 % IV SOLN
INTRAVENOUS | Status: DC
Start: 2015-08-16 — End: 2015-08-17
  Administered 2015-08-17: via INTRAVENOUS

## 2015-08-16 MED ORDER — SODIUM CHLORIDE 0.9 % IV BOLUS (SEPSIS): 1000.0000 mL | Freq: Once | INTRAVENOUS | Status: DC

## 2015-08-16 MED ORDER — INSULIN ASPART 100 UNIT/ML ~~LOC~~ SOLN
8.0000 [IU] | Freq: Once | SUBCUTANEOUS | Status: AC
  Administered 2015-08-16: 8 [IU] via SUBCUTANEOUS
  Filled 2015-08-16: qty 1

## 2015-08-16 MED ORDER — SODIUM CHLORIDE 0.9 % IV BOLUS (SEPSIS)
1000.0000 mL | Freq: Once | INTRAVENOUS | Status: AC
  Administered 2015-08-16: 1000 mL via INTRAVENOUS

## 2015-08-16 MED ORDER — ACETAMINOPHEN 325 MG PO TABS
650.0000 mg | ORAL_TABLET | Freq: Once | ORAL | Status: AC
  Administered 2015-08-17: 650 mg via ORAL
  Filled 2015-08-16: qty 2

## 2015-08-16 NOTE — ED Notes (Signed)
Pt with hx of DM type 2 states she had had CBG readings of 400-500 for the past 3 days. Pt states she has headache, dizziness, and nausea for the past 2 days.
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
## 2015-08-17 LAB — HEPATIC FUNCTION PANEL
ALBUMIN: 4.1 g/dL (ref 3.5–5.0)
ALK PHOS: 91 U/L (ref 38–126)
ALT: 55 U/L — ABNORMAL HIGH (ref 14–54)
AST: 190 U/L — AB (ref 15–41)
BILIRUBIN TOTAL: 0.8 mg/dL (ref 0.3–1.2)
Bilirubin, Direct: 0.1 mg/dL (ref 0.1–0.5)
Indirect Bilirubin: 0.7 mg/dL (ref 0.3–0.9)
TOTAL PROTEIN: 8.2 g/dL — AB (ref 6.5–8.1)

## 2015-08-17 LAB — LIPASE, BLOOD: LIPASE: 44 U/L (ref 11–51)

## 2015-08-17 LAB — URINALYSIS, ROUTINE W REFLEX MICROSCOPIC
Bilirubin Urine: NEGATIVE
Glucose, UA: >1000 mg/dL — AB
HGB URINE DIPSTICK: NEGATIVE
Ketones, ur: 40 mg/dL — AB
LEUKOCYTES UA: NEGATIVE
NITRITE: NEGATIVE
PROTEIN: 30 mg/dL — AB
Specific Gravity, Urine: 1.044 — ABNORMAL HIGH (ref 1.005–1.030)
pH: 6 (ref 5.0–8.0)

## 2015-08-17 LAB — CBG MONITORING, ED: GLUCOSE-CAPILLARY: 251 mg/dL — AB (ref 65–99)

## 2015-08-17 LAB — URINE MICROSCOPIC-ADD ON: RBC / HPF: NONE SEEN RBC/hpf (ref 0–5)

## 2015-08-17 MED ORDER — ONDANSETRON HCL 4 MG/2ML IJ SOLN
4.0000 mg | Freq: Once | INTRAMUSCULAR | Status: AC
  Administered 2015-08-17: 4 mg via INTRAVENOUS
  Filled 2015-08-17: qty 2

## 2015-08-17 NOTE — ED Notes (Signed)
PT DISCHARGED. INSTRUCTIONS GIVEN. AAOX3. PT IN NO APPARENT DISTRESS. THE OPPORTUNITY TO ASK QUESTIONS WAS PROVIDED. 

## 2015-08-17 NOTE — Discharge Instructions (Signed)
Continue taking her medications as prescribed. Follow-up with your primary care provider within the next 2 days. Please return to the Emergency Department if symptoms worsen or new onset of fever, chills, cough, shortness of breath, chest pain, vomiting, diarrhea, abdominal pain.

## 2015-08-19 ENCOUNTER — Telehealth: Payer: Self-pay | Admitting: Pharmacist

## 2015-08-19 ENCOUNTER — Ambulatory Visit (INDEPENDENT_AMBULATORY_CARE_PROVIDER_SITE_OTHER): Payer: Medicaid Other | Admitting: Dietician

## 2015-08-19 ENCOUNTER — Encounter: Payer: Self-pay | Admitting: Pulmonary Disease

## 2015-08-19 ENCOUNTER — Encounter: Payer: Self-pay | Admitting: Dietician

## 2015-08-19 ENCOUNTER — Ambulatory Visit (INDEPENDENT_AMBULATORY_CARE_PROVIDER_SITE_OTHER): Payer: Medicaid Other | Admitting: Pulmonary Disease

## 2015-08-19 VITALS — BP 114/73 | HR 64 | Temp 97.9°F | Ht 63.0 in | Wt 133.9 lb

## 2015-08-19 DIAGNOSIS — Z7984 Long term (current) use of oral hypoglycemic drugs: Secondary | ICD-10-CM | POA: Diagnosis not present

## 2015-08-19 DIAGNOSIS — I10 Essential (primary) hypertension: Secondary | ICD-10-CM | POA: Diagnosis present

## 2015-08-19 DIAGNOSIS — E1165 Type 2 diabetes mellitus with hyperglycemia: Secondary | ICD-10-CM | POA: Diagnosis present

## 2015-08-19 DIAGNOSIS — Z79899 Other long term (current) drug therapy: Secondary | ICD-10-CM

## 2015-08-19 DIAGNOSIS — Z794 Long term (current) use of insulin: Secondary | ICD-10-CM | POA: Diagnosis not present

## 2015-08-19 LAB — HM DIABETES EYE EXAM

## 2015-08-19 LAB — GLUCOSE, CAPILLARY: GLUCOSE-CAPILLARY: 306 mg/dL — AB (ref 65–99)

## 2015-08-19 MED ORDER — INSULIN GLARGINE 100 UNIT/ML SOLOSTAR PEN: 12.0000 [IU] | PEN_INJECTOR | Freq: Every day | SUBCUTANEOUS | Status: DC

## 2015-08-19 MED ORDER — PEN NEEDLES 31G X 5 MM MISC: 1.0000 | Freq: Every day | Status: DC

## 2015-08-19 NOTE — Progress Notes (Signed)
Medicine attending: Medical history, presenting problems, physical findings, and medications, reviewed with resident physician Dr Audria Nine on the day of the patient visit and I concur with her evaluation and management plan which we discussed together. The patient will be educated and started on insulin due to compliance issues and inability to control sugars with both Januvia & Glucophage.

## 2015-08-19 NOTE — Assessment & Plan Note (Signed)
Assessment: Poorly controlled still. She has not stayed on her metformin because she thinks it is causing hyperglycemia.  Plan: -Start Lantus 12u QHS. -Continue Januvia 100mg  daily. Encouraged her to continue metformin. -Follow up in 2 weeks for further titration of Lantus -Retinal scan today.

## 2015-08-19 NOTE — Patient Instructions (Addendum)
Stop taking lisinopril-hydrochlorothiazide.  If you have low blood sugars, please call the clinic.

## 2015-08-19 NOTE — Telephone Encounter (Signed)
Patient states she usually takes etanercept on Mondays but has missed her dose. Advised patient that today is not too late and she can go ahead and take the missed dose. Provided education that if patient had missed the dose by more than 3 days, would recommend she skip the missed dose.  Patient verbalized understanding by repeating back and stated she will take her etanercept dose today. Advised her to contact me if any questions arise.

## 2015-08-19 NOTE — Assessment & Plan Note (Signed)
BP Readings from Last 3 Encounters:  08/19/15 114/73  08/17/15 107/77  08/12/15 98/73   Assessment: Blood pressure control: Controlled. She had not stopped her BP med.  Plan: Medications:  Hold lisinopril-hydrochlorothiazide 20-25mg  daily. Other plans:  -Follow up in 2 weeks. May consider restarting low dose lisinopril if her BP is uncontrolled.
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
## 2015-08-20 ENCOUNTER — Telehealth: Payer: Self-pay | Admitting: Internal Medicine

## 2015-08-20 NOTE — Telephone Encounter (Signed)
HOME HEALTH NURSE ASKED HER TO CALL B/C BLOOD SUGAR [phone]

## 2015-08-26 ENCOUNTER — Encounter: Payer: Self-pay | Admitting: Internal Medicine

## 2015-08-26 ENCOUNTER — Encounter: Payer: Medicaid Other | Admitting: Internal Medicine

## 2015-08-27 ENCOUNTER — Ambulatory Visit (INDEPENDENT_AMBULATORY_CARE_PROVIDER_SITE_OTHER): Payer: Medicaid Other | Admitting: Internal Medicine

## 2015-08-27 VITALS — BP 153/87 | HR 57 | Temp 98.0°F | Ht 63.0 in | Wt 137.0 lb

## 2015-08-27 DIAGNOSIS — J349 Unspecified disorder of nose and nasal sinuses: Secondary | ICD-10-CM

## 2015-08-27 DIAGNOSIS — I10 Essential (primary) hypertension: Secondary | ICD-10-CM

## 2015-08-27 DIAGNOSIS — J069 Acute upper respiratory infection, unspecified: Secondary | ICD-10-CM | POA: Insufficient documentation

## 2015-08-27 MED ORDER — LISINOPRIL 20 MG PO TABS: 20.0000 mg | ORAL_TABLET | Freq: Every day | ORAL | Status: DC

## 2015-08-27 NOTE — Patient Instructions (Signed)
1. You do not have pneumonia but you probably have a cold.  This is probably caused by a virus so antibiotics will not work for it.  Please be sure to drink enough fluids and rest when you can.  You can take mucinex or robitussin for cough.  Try lozenges or throat spray to help with your sore throat.  You can also try gargling with salt water.  Tylenol can be used for mild sinus/headache pain.  If you start to have fevers, problems breathing or any worsening of symptoms please come back to clinic.  Please return to see Dr. Hulen Luster on February 1.   2. Please take all medications as prescribed.  I prescribed a new blood pressure pill, lisinopril.  You were taking lisinopril and HCTZ before but let's start back with just lisinopril for now.  Dr. Hulen Luster can check on your blood pressure when you come back in 2 weeks.    3. If you have worsening of your symptoms or new symptoms arise, please call the clinic [phone] or go to the ER immediately if symptoms are severe.
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
## 2015-08-27 NOTE — Assessment & Plan Note (Addendum)
BP Readings from Last 3 Encounters:  08/27/15 153/87  08/19/15 114/73  08/17/15 107/77    Lab Results  Component Value Date   NA 129* 08/16/2015   K 3.5 08/16/2015   CREATININE 0.70 08/16/2015    Assessment: Blood pressure control:  slight elevation Progress toward BP goal:   worsened Comments: lisinopril-HCTZ was stopped last visit due to low-normal BPs and lightheadedness.    Plan: Medications:  RESUME lisinopril 20mg  daily.   Other plans: I explained to patient that I have sent in new rx for lisinopril and asked her to discard old rx for lisinopril-HCTZ.  She voiced understanding.  Return in 2 weeks to see PCP.  Can titrate ACEI or add HCTZ back if needed.

## 2015-08-27 NOTE — Assessment & Plan Note (Addendum)
Assessment:  1 week of sore throat, sneezing, rhinorrhea, congestion most c/w viral URI.  VSS, lungs clear, no dyspnea, fever or systemic symptoms to suggest pneumonia or flu.  She received her flu shot 2 weeks ago.     Plan:  Supportive care - fluids, rest, mucinex for cough, gargle with salt water or use lozenges for sore throat.  RTC if worsening or if she develops fever, dyspnea, etc.  She is already scheduled for follow-up with her PCP in 2 weeks.

## 2015-08-28 ENCOUNTER — Encounter: Payer: Self-pay | Admitting: Internal Medicine

## 2015-08-31 NOTE — Progress Notes (Signed)
Internal Medicine Clinic Attending  Case discussed with Dr. Wilson at the time of the visit.  We reviewed the resident's history and exam and pertinent patient test results.  I agree with the assessment, diagnosis, and plan of care documented in the resident's note.  

## 2015-09-02 ENCOUNTER — Encounter: Payer: Medicaid Other | Admitting: Internal Medicine

## 2015-09-02 ENCOUNTER — Other Ambulatory Visit: Payer: Self-pay | Admitting: Internal Medicine

## 2015-09-04 ENCOUNTER — Telehealth: Payer: Self-pay | Admitting: Internal Medicine

## 2015-09-04 NOTE — Telephone Encounter (Signed)
Lorraine [name] from [facility] [phone]) ext [phone] had a home visit.  Patient with High blood sugars, Patient not taking meds.  Patient wishing to change pharmacy [facility] to [facility] pharmacy so they can help educate the patient on her meds as well as deliver her meds for her.  Please contact the patient in regards to her medications.

## 2015-09-08 ENCOUNTER — Encounter: Payer: Self-pay | Admitting: Dietician

## 2015-09-09 ENCOUNTER — Ambulatory Visit (INDEPENDENT_AMBULATORY_CARE_PROVIDER_SITE_OTHER): Payer: Medicaid Other | Admitting: Internal Medicine

## 2015-09-09 ENCOUNTER — Encounter: Payer: Self-pay | Admitting: Internal Medicine

## 2015-09-09 VITALS — BP 143/68 | HR 57 | Temp 98.2°F | Ht 63.0 in | Wt 134.4 lb

## 2015-09-09 DIAGNOSIS — E785 Hyperlipidemia, unspecified: Secondary | ICD-10-CM | POA: Insufficient documentation

## 2015-09-09 DIAGNOSIS — Z7984 Long term (current) use of oral hypoglycemic drugs: Secondary | ICD-10-CM | POA: Diagnosis not present

## 2015-09-09 DIAGNOSIS — Z794 Long term (current) use of insulin: Secondary | ICD-10-CM

## 2015-09-09 DIAGNOSIS — Z79899 Other long term (current) drug therapy: Secondary | ICD-10-CM | POA: Diagnosis not present

## 2015-09-09 DIAGNOSIS — K219 Gastro-esophageal reflux disease without esophagitis: Secondary | ICD-10-CM | POA: Diagnosis present

## 2015-09-09 DIAGNOSIS — M797 Fibromyalgia: Secondary | ICD-10-CM | POA: Diagnosis not present

## 2015-09-09 DIAGNOSIS — E1165 Type 2 diabetes mellitus with hyperglycemia: Secondary | ICD-10-CM

## 2015-09-09 LAB — GLUCOSE, CAPILLARY: Glucose-Capillary: 164 mg/dL — ABNORMAL HIGH (ref 65–99)

## 2015-09-09 MED ORDER — OMEPRAZOLE 20 MG PO CPDR: 20.0000 mg | DELAYED_RELEASE_CAPSULE | Freq: Every day | ORAL | Status: DC

## 2015-09-09 MED ORDER — PRAVASTATIN SODIUM 40 MG PO TABS: 40.0000 mg | ORAL_TABLET | Freq: Every day | ORAL | Status: DC

## 2015-09-09 MED ORDER — PREGABALIN 50 MG PO CAPS: 50.0000 mg | ORAL_CAPSULE | Freq: Three times a day (TID) | ORAL | Status: DC

## 2015-09-09 MED ORDER — INSULIN GLARGINE 100 UNIT/ML SOLOSTAR PEN: 16.0000 [IU] | PEN_INJECTOR | Freq: Every day | SUBCUTANEOUS | Status: DC

## 2015-09-09 MED ORDER — INSULIN GLARGINE 100 UNIT/ML SOLOSTAR PEN: 14.0000 [IU] | PEN_INJECTOR | Freq: Every day | SUBCUTANEOUS | Status: DC

## 2015-09-09 NOTE — Assessment & Plan Note (Signed)
Refilled lyrica.  

## 2015-09-09 NOTE — Assessment & Plan Note (Addendum)
Lab Results  Component Value Date   HGBA1C 9.4 08/12/2015   HGBA1C 6.3 04/16/2015   HGBA1C 6.6 12/08/2014     Assessment: Diabetes control:  uncontrolled Progress toward A1C goal:   unable to assess Comments: on januvia 100mg  and lantus 12 units qhs. Stopped taking metformin due to side effects. Glucometer report ranges from 76-493. Denies hypoglycemia sx. Has blurry vision, polydipsia, and polyuria.   Plan: Medications: increased lantus to 16 units qhs. Resumed metformin 500mg  qhs.  Home glucose monitoring: Frequency:  TID Timing:  w meals Instruction/counseling given: reminded to bring blood glucose meter & log to each visit Educational resources provided: handout Self management tools provided: copy of home glucose meter download Other plans: restarted metformin 500mg . Instructed her to take it at night and educated pt that GI side effects will decrease over time. F/u in 1 month to review glucometer report.

## 2015-09-09 NOTE — Assessment & Plan Note (Signed)
Refilled pravastatin. 

## 2015-09-09 NOTE — Progress Notes (Signed)
Medicine attending: Medical history, presenting problems, physical findings, and medications, reviewed with resident physician Dr Diana Truong on the day of the patient visit and I concur with her evaluation and management plan. 

## 2015-09-09 NOTE — Assessment & Plan Note (Signed)
Denies current GERD sx. Refilled prilosec.
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
## 2015-09-09 NOTE — Patient Instructions (Addendum)
Start taking Lantus 16 units at night.   Start taking metformin 500mg  at night.   Follow up with Dr. Baxter Flattery for your hepatitis C.

## 2015-09-17 ENCOUNTER — Other Ambulatory Visit: Payer: Self-pay | Admitting: Internal Medicine

## 2015-09-17 NOTE — Telephone Encounter (Signed)
NEW FOR [facility] [phone], LISNOPRIL 20MG 

## 2015-09-18 ENCOUNTER — Other Ambulatory Visit: Payer: Self-pay

## 2015-09-18 MED ORDER — LISINOPRIL 20 MG PO TABS: 20.0000 mg | ORAL_TABLET | Freq: Every day | ORAL | Status: DC

## 2015-09-18 NOTE — Telephone Encounter (Signed)
Completed under a dif. Encounter.

## 2015-09-22 ENCOUNTER — Other Ambulatory Visit: Payer: Self-pay

## 2015-09-22 DIAGNOSIS — E1165 Type 2 diabetes mellitus with hyperglycemia: Secondary | ICD-10-CM

## 2015-09-22 MED ORDER — METFORMIN HCL 500 MG PO TABS: ORAL_TABLET | ORAL | Status: DC

## 2015-09-22 MED ORDER — INSULIN GLARGINE 100 UNIT/ML SOLOSTAR PEN: 16.0000 [IU] | PEN_INJECTOR | Freq: Every day | SUBCUTANEOUS | Status: DC

## 2015-09-22 NOTE — Telephone Encounter (Signed)
I know that lantus was just recently refilled, pt just switched pharmacies and they have incorrect refill information on Lantus.  Do you mind just sending new RX to them along with refill on metformin.

## 2015-09-22 NOTE — Telephone Encounter (Signed)
Spoke with pharmacy, they deliver medication and will reiterate dosing instructions at that time

## 2015-09-22 NOTE — Telephone Encounter (Signed)
Leigh - - Would you mind calling Ms. Wile and finding out what dose of metformin she is taking.  This Rx was a starting dose and she should be on 1 tab in the morning 2 in the evening.   Thanks  EBM

## 2015-09-22 NOTE — Telephone Encounter (Signed)
Ms. Weddington never increased her dose.  She reports only taking 1 tablet at night.  I advised her to pay close attention to the dose instructions when she picks up from pharmacy.  I will also call [facility] to have them flag it to give her some instruction on how you prescribe when she picks it up.

## 2015-09-23 ENCOUNTER — Telehealth: Payer: Self-pay | Admitting: Internal Medicine

## 2015-09-23 NOTE — Telephone Encounter (Signed)
PT NEEDS MEDICATION FOR YEAST INFECTION, [phone]

## 2015-09-23 NOTE — Telephone Encounter (Signed)
Called pt, she declines appointment, she just saw PCP on 09/09/15.  She will look for something OTC and call back if she would like to schedule

## 2015-09-29 ENCOUNTER — Telehealth: Payer: Self-pay | Admitting: *Deleted

## 2015-09-29 NOTE — Telephone Encounter (Signed)
Earlie Server [facility] calls and states pt called her and told her she could not get an answer when she called several times about her problem she could not get an answer on the ph, it just rang and rang and then she would get a recording but no voicemail or person. Dorothy from [facility] states she called herself yesterday 3 times and did not get a vmail or person, she states she did look at the clock the last call and it was 1600 so maybe this was he problem. Pt states she is having h/a's, nausea, abd discomfort and diarrhea taking the metformin, she has been staying sick so she is going to stop the metformin and talk w/ you at her appt, you may call her if you want or call dorothy  No, the [facility] nurse stated pt can wait til appt

## 2015-09-29 NOTE — Telephone Encounter (Signed)
Dr. Hulen Luster is not in the office currently.  Does she need to be called today or have an earlier appointment?

## 2015-10-02 ENCOUNTER — Other Ambulatory Visit: Payer: Self-pay | Admitting: *Deleted

## 2015-10-02 ENCOUNTER — Telehealth: Payer: Self-pay | Admitting: Internal Medicine

## 2015-10-02 MED ORDER — SITAGLIPTIN PHOSPHATE 100 MG PO TABS: 100.0000 mg | ORAL_TABLET | Freq: Every day | ORAL | Status: DC

## 2015-10-02 NOTE — Telephone Encounter (Signed)
Dorothy from [facility] called in reference to no one contacting the patient back since Tuesday 09/29/2015.  She also has concerns about patient's Medication's and would like a call back today.

## 2015-10-02 NOTE — Telephone Encounter (Signed)
Called dorothy, reminded her of conversation 2/21 and that the note was read back, she agreed and stated a misunderstanding, she stated that the pt was continuing to have issues with the metformin, had stopped but didn't know what to do, ask what pt's cbg results were and she stated she did not have that information. Called pt, she states her abd discomfort and the diarrhea have stopped and "i feel fine". She was ask what her cbg's have been and she states 90's to 100's, she states she has not taken metformin since 2/21 and "i think i will be fine til my appointment on the 1st", she is reminded of appt date and time, she states she will be here for that, she is advised to call for any problems or concerns with her blood sugar or how she generally feels, she is agreeable. Also let her know that i had spoken to [facility] this morning and the [place] should be ready today, she was agreeable

## 2015-10-07 ENCOUNTER — Ambulatory Visit (INDEPENDENT_AMBULATORY_CARE_PROVIDER_SITE_OTHER): Payer: Medicaid Other | Admitting: Internal Medicine

## 2015-10-07 ENCOUNTER — Encounter: Payer: Self-pay | Admitting: Internal Medicine

## 2015-10-07 VITALS — BP 142/91 | HR 69 | Temp 98.1°F | Ht 63.0 in | Wt 132.1 lb

## 2015-10-07 DIAGNOSIS — E1165 Type 2 diabetes mellitus with hyperglycemia: Secondary | ICD-10-CM | POA: Diagnosis present

## 2015-10-07 DIAGNOSIS — B182 Chronic viral hepatitis C: Secondary | ICD-10-CM | POA: Diagnosis not present

## 2015-10-07 DIAGNOSIS — B373 Candidiasis of vulva and vagina: Secondary | ICD-10-CM

## 2015-10-07 DIAGNOSIS — B3731 Acute candidiasis of vulva and vagina: Secondary | ICD-10-CM

## 2015-10-07 DIAGNOSIS — Z7984 Long term (current) use of oral hypoglycemic drugs: Secondary | ICD-10-CM

## 2015-10-07 DIAGNOSIS — N898 Other specified noninflammatory disorders of vagina: Secondary | ICD-10-CM | POA: Diagnosis not present

## 2015-10-07 DIAGNOSIS — B9689 Other specified bacterial agents as the cause of diseases classified elsewhere: Secondary | ICD-10-CM | POA: Insufficient documentation

## 2015-10-07 DIAGNOSIS — N760 Acute vaginitis: Secondary | ICD-10-CM

## 2015-10-07 LAB — GLUCOSE, CAPILLARY: GLUCOSE-CAPILLARY: 100 mg/dL — AB (ref 65–99)

## 2015-10-07 MED ORDER — FLUCONAZOLE 150 MG PO TABS: 150.0000 mg | ORAL_TABLET | Freq: Once | ORAL | Status: DC

## 2015-10-07 MED ORDER — METFORMIN HCL 1000 MG PO TABS: ORAL_TABLET | ORAL | Status: DC

## 2015-10-07 MED ORDER — SITAGLIPTIN PHOSPHATE 50 MG PO TABS: 50.0000 mg | ORAL_TABLET | Freq: Every day | ORAL | Status: DC

## 2015-10-07 NOTE — Assessment & Plan Note (Addendum)
Lab Results  Component Value Date   HGBA1C 9.4 08/12/2015   HGBA1C 6.3 04/16/2015   HGBA1C 6.6 12/08/2014     Assessment: Diabetes control:  not controlled Progress toward A1C goal:   unable to assess Comments: on januvia 100mg  daily. Was unable to tolerate metformin 500mg  qhs due to GI side effects but she brings in an old AVS and states she was able to tolerate metformin 1000mg  BID in the past which she is requesting to go back on. Her glucometer report ranges from 66-351. She states she feels hypoglycemic with CBGs less that 70 and knows to eat candy and drink juice. She eats regular meals TID.   Plan: Medications:  Decrease januvia to 50mg  daily due to low CBGs. Trial of metformin 1000mg  qhs.  Home glucose monitoring: Frequency:  TID Timing:  w meals Instruction/counseling given: reminded to bring blood glucose meter & log to each visit and discussed foot care Educational resources provided:   Self management tools provided:   Other plans: f/u next month for a1c. Foot exam done today. Schedule pt an appt w/ Butch Penny for diabetes education and nutrition.

## 2015-10-07 NOTE — Patient Instructions (Addendum)
Call ID clinic for an appointment at:  [facility] [address] [place], [place] [address]  Start taking Tong[place] 50mg  daily and metformin 1000mg  at night.   You can take diflucan 150mg  once for your yeast infection.

## 2015-10-07 NOTE — Assessment & Plan Note (Signed)
Pt instructed to f/u ID annually, last saw Dr. Baxter Flattery in 2015. Pt given address and phone number for ID clinic.

## 2015-10-07 NOTE — Progress Notes (Signed)
Medicine attending: Medical history, presenting problems, physical findings, and medications, reviewed with resident physician Dr Diana Truong on the day of the patient visit and I concur with her evaluation and management plan. 

## 2015-10-07 NOTE — Assessment & Plan Note (Signed)
Pt has had vaginal itching x 1 month w/ white vaginal d/c that she states occurs whenever she increases her medications. Denies any recent abx use.  - rx for fluconazole 100mg   - pt to schedule an appt for pap smear, last one was in 2013
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
## 2015-10-21 ENCOUNTER — Telehealth: Payer: Self-pay | Admitting: Internal Medicine

## 2015-10-21 NOTE — Telephone Encounter (Signed)
Please schedule her an appt to see me. I will be in clinic all month next month. Thanks.   Julious Oka, MD Internal Medicine Resident, PGY [facility] Pager: [phone]

## 2015-10-21 NOTE — Telephone Encounter (Signed)
Dr. Hulen Luster, what would you like patient to do?  She discussed this at last OV

## 2015-10-21 NOTE — Telephone Encounter (Signed)
Pt will be seen 3/22 by dr Hulen Luster

## 2015-10-21 NOTE — Telephone Encounter (Signed)
Spoke with patient, headaches, nausea, and vomiting, "doesn't think these are the pills for me."  I advised she may need to be seen again to adjust medications- will wait to hear back from PCP

## 2015-10-21 NOTE — Telephone Encounter (Signed)
Pt states that her metformin is not agreeing with her. nausea and diarrhea

## 2015-10-28 ENCOUNTER — Encounter: Payer: Self-pay | Admitting: Internal Medicine

## 2015-10-28 ENCOUNTER — Ambulatory Visit (INDEPENDENT_AMBULATORY_CARE_PROVIDER_SITE_OTHER): Payer: Medicaid Other | Admitting: Internal Medicine

## 2015-10-28 VITALS — BP 170/90 | HR 60 | Temp 98.1°F | Ht 63.0 in | Wt 135.1 lb

## 2015-10-28 DIAGNOSIS — Z794 Long term (current) use of insulin: Secondary | ICD-10-CM

## 2015-10-28 DIAGNOSIS — Z79899 Other long term (current) drug therapy: Secondary | ICD-10-CM | POA: Diagnosis not present

## 2015-10-28 DIAGNOSIS — E1165 Type 2 diabetes mellitus with hyperglycemia: Secondary | ICD-10-CM

## 2015-10-28 DIAGNOSIS — I10 Essential (primary) hypertension: Secondary | ICD-10-CM

## 2015-10-28 DIAGNOSIS — B182 Chronic viral hepatitis C: Secondary | ICD-10-CM | POA: Diagnosis not present

## 2015-10-28 LAB — GLUCOSE, CAPILLARY: Glucose-Capillary: 83 mg/dL (ref 65–99)

## 2015-10-28 MED ORDER — SITAGLIPTIN PHOSPHATE 100 MG PO TABS: 100.0000 mg | ORAL_TABLET | Freq: Every day | ORAL | Status: DC

## 2015-10-28 MED ORDER — INSULIN GLARGINE 100 UNIT/ML SOLOSTAR PEN: 10.0000 [IU] | PEN_INJECTOR | Freq: Every day | SUBCUTANEOUS | Status: DC

## 2015-10-28 MED ORDER — AMLODIPINE BESYLATE 5 MG PO TABS: 5.0000 mg | ORAL_TABLET | Freq: Every day | ORAL | Status: DC

## 2015-10-28 NOTE — Patient Instructions (Addendum)
Start taking [place] 100mg  daily.   I decreased your dose of Lantus to 10 units at night.   Start taking norvasc 5mg  daily for blood pressure.

## 2015-10-29 NOTE — Progress Notes (Signed)
Internal Medicine Clinic Attending  Case discussed with Dr. Truong at the time of the visit.  We reviewed the resident's history and exam and pertinent patient test results.  I agree with the assessment, diagnosis, and plan of care documented in the resident's note.  
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
## 2015-10-29 NOTE — Assessment & Plan Note (Signed)
Referral placed for ID for hep C follow up.

## 2015-10-29 NOTE — Assessment & Plan Note (Signed)
BP Readings from Last 3 Encounters:  10/28/15 170/90  10/07/15 142/91  09/09/15 143/68    Lab Results  Component Value Date   NA 129* 08/16/2015   K 3.5 08/16/2015   CREATININE 0.70 08/16/2015    Assessment: Blood pressure control:  uncontrolled Progress toward BP goal:   deteriorated  Comments: on lisinopril 20mg . Repeat SBP 173. Pt complaining of HAs that could be due to elevated BP.    Plan: Medications:  continue current medications, started on norvasc 5mg .  Educational resources provided:   Self management tools provided:   Other plans: f/u in 1 month for BP check, can increase norvasc to 10mg  if BP still elevated.

## 2015-10-29 NOTE — Assessment & Plan Note (Signed)
Lab Results  Component Value Date   HGBA1C 9.4 08/12/2015   HGBA1C 6.3 04/16/2015   HGBA1C 6.6 12/08/2014     Assessment: Diabetes control:  uncontrolled Progress toward A1C goal:   non able to assess Comments: pt on lantus 16 units qhs and januvia 50. Unable to tolerate metformin. Glucometer report ranges from 60-179. Lorraine [name] has lows in the morning and one noted in the evening.   Plan: Medications:  Increased januvia back to 100mg  daily, stopped metformin due to side effects, and decreased lantus to 10 units qhs due to hypoglycemia. Lorraine [name] has an appt apr 6th with a nutritionist. Instruction/counseling given: reminded to bring blood glucose meter & log to each visit and discussed diet Educational resources provided:   Self management tools provided:   Other plans: f/u in 1 month for hemoglobin a1c. Can consider byetta or liraglutide in the future.

## 2015-11-02 ENCOUNTER — Telehealth: Payer: Self-pay | Admitting: Internal Medicine

## 2015-11-02 NOTE — Telephone Encounter (Signed)
Pt wants to talk to nurse °

## 2015-11-02 NOTE — Telephone Encounter (Signed)
Pt needs a pap smear. Please have her schedule an appt w/ clinic for one. Thanks.   Julious Oka, MD Internal Medicine Resident, PGY [facility] Pager: [phone]

## 2015-11-02 NOTE — Telephone Encounter (Signed)
Spoke with patient, she is requesting something for yeast infection, the 1 tablet from beginning of March did not clear up her issue.  She questions whether her diabetes is causing this to not resolve.  Please advise.

## 2015-11-04 NOTE — Telephone Encounter (Signed)
Spoke with patient, she has upcoming appointment 4/19 915am, offered earlier appointment but she declines.  She is aware to call back if would like to be seen sooner.

## 2015-11-09 ENCOUNTER — Ambulatory Visit: Payer: Medicaid Other | Admitting: Internal Medicine

## 2015-11-13 ENCOUNTER — Other Ambulatory Visit: Payer: Self-pay | Admitting: *Deleted

## 2015-11-16 MED ORDER — GLUCOSE BLOOD VI STRP: 1.0000 | ORAL_STRIP | Status: DC

## 2015-11-23 ENCOUNTER — Other Ambulatory Visit: Payer: Self-pay | Admitting: Pulmonary Disease

## 2015-11-25 ENCOUNTER — Encounter: Payer: Self-pay | Admitting: Internal Medicine

## 2015-11-25 ENCOUNTER — Other Ambulatory Visit (HOSPITAL_COMMUNITY)
Admission: RE | Admit: 2015-11-25 | Discharge: 2015-11-25 | Disposition: A | Discharge status: Home / Self Care | Payer: Medicaid Other | Source: Ambulatory Visit | Attending: Oncology | Admitting: Oncology

## 2015-11-25 ENCOUNTER — Ambulatory Visit (INDEPENDENT_AMBULATORY_CARE_PROVIDER_SITE_OTHER): Payer: Medicaid Other | Admitting: Internal Medicine

## 2015-11-25 VITALS — BP 142/80 | HR 59 | Temp 97.8°F | Wt 132.7 lb

## 2015-11-25 DIAGNOSIS — Z1272 Encounter for screening for malignant neoplasm of vagina: Secondary | ICD-10-CM

## 2015-11-25 DIAGNOSIS — Z113 Encounter for screening for infections with a predominantly sexual mode of transmission: Secondary | ICD-10-CM | POA: Insufficient documentation

## 2015-11-25 DIAGNOSIS — Z90710 Acquired absence of both cervix and uterus: Secondary | ICD-10-CM

## 2015-11-25 DIAGNOSIS — Z8742 Personal history of other diseases of the female genital tract: Secondary | ICD-10-CM

## 2015-11-25 DIAGNOSIS — Z0000 Encounter for general adult medical examination without abnormal findings: Secondary | ICD-10-CM

## 2015-11-25 DIAGNOSIS — E1165 Type 2 diabetes mellitus with hyperglycemia: Secondary | ICD-10-CM

## 2015-11-25 DIAGNOSIS — Z124 Encounter for screening for malignant neoplasm of cervix: Secondary | ICD-10-CM

## 2015-11-25 DIAGNOSIS — E119 Type 2 diabetes mellitus without complications: Secondary | ICD-10-CM | POA: Diagnosis present

## 2015-11-25 DIAGNOSIS — Z794 Long term (current) use of insulin: Secondary | ICD-10-CM | POA: Diagnosis not present

## 2015-11-25 LAB — POCT GLYCOSYLATED HEMOGLOBIN (HGB A1C): Hemoglobin A1C: 6

## 2015-11-25 LAB — GLUCOSE, CAPILLARY: Glucose-Capillary: 73 mg/dL (ref 65–99)
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
## 2015-11-25 NOTE — Patient Instructions (Signed)
Start taking Lantus 8 units at night.

## 2015-11-25 NOTE — Assessment & Plan Note (Addendum)
Lab Results  Component Value Date   HGBA1C 6.0 11/25/2015   HGBA1C 9.4 08/12/2015   HGBA1C 6.3 04/16/2015     Assessment: Diabetes control:  controlled Progress toward A1C goal:   at goal Comments: on januvia 100mg  and lantus 10 U qhs. Glucometer report ranges from 72-177.   Plan: Medications:  Decreased lantus to 8 units due to low CBGs in the 70's.  Home glucose monitoring: Frequency:  tid Timing:  w meals Instruction/counseling given: reminded to bring blood glucose meter & log to each visit Educational resources provided:   Self management tools provided:   Other plans: f/u 3 months or sooner if CBGs in the 70's.

## 2015-11-26 LAB — CERVICOVAGINAL ANCILLARY ONLY
CHLAMYDIA, DNA PROBE: NEGATIVE
NEISSERIA GONORRHEA: NEGATIVE
Wet Prep (BD Affirm): POSITIVE — AB

## 2015-11-27 MED ORDER — METRONIDAZOLE 500 MG PO TABS: 500.0000 mg | ORAL_TABLET | Freq: Two times a day (BID) | ORAL | Status: AC

## 2015-11-27 NOTE — Assessment & Plan Note (Addendum)
Pt constantly asking for diflucan for yeast infections. She last ask for diflucan over the phone last month. She was overdue for a pap smear and unsure whether or not she still had her cervix. She had a hysterectomy for abnormal bleeding. She was then asked to come in for a pap smear and to evaluate for a yeast infxn. In the beginning of the exam she said she had cottage cheese like discharge but on exam she did not have any discharge. Labia were nl and non irritated. Cervix was not present. She is sexually active and does not use condoms all the time.   - did not fill rx for diflucan as no signs of yeast infxn - sent in specimens for wet prep and STI tests.  Update: wet prep + for gardenella. Called pt this am at 8:48 (4/21) and she is endorsing faint foul odor and discharge. Sent in flagyl 500mg  BID x 7 days.

## 2015-11-27 NOTE — Progress Notes (Signed)
Medicine attending: Medical history, presenting problems, physical findings, and medications, reviewed with resident physician Dr Diana Truong on the day of the patient visit and I concur with her evaluation and management plan. 

## 2015-12-08 ENCOUNTER — Telehealth: Payer: Self-pay

## 2015-12-08 NOTE — Telephone Encounter (Signed)
Pt called and is still complaining of vag discharge that has not resolved- she is requesting another appointment.  Added to schedule for Thursday.  FYI

## 2015-12-10 ENCOUNTER — Other Ambulatory Visit (HOSPITAL_COMMUNITY)
Admission: RE | Admit: 2015-12-10 | Discharge: 2015-12-10 | Disposition: A | Discharge status: Home / Self Care | Payer: Medicaid Other | Source: Ambulatory Visit | Attending: Internal Medicine | Admitting: Internal Medicine

## 2015-12-10 ENCOUNTER — Ambulatory Visit (INDEPENDENT_AMBULATORY_CARE_PROVIDER_SITE_OTHER): Payer: Medicaid Other | Admitting: Internal Medicine

## 2015-12-10 ENCOUNTER — Encounter: Payer: Self-pay | Admitting: Internal Medicine

## 2015-12-10 VITALS — BP 122/74 | HR 61 | Temp 98.0°F | Ht 63.0 in | Wt 131.5 lb

## 2015-12-10 DIAGNOSIS — Z113 Encounter for screening for infections with a predominantly sexual mode of transmission: Secondary | ICD-10-CM | POA: Diagnosis present

## 2015-12-10 DIAGNOSIS — N760 Acute vaginitis: Secondary | ICD-10-CM | POA: Insufficient documentation

## 2015-12-10 DIAGNOSIS — M797 Fibromyalgia: Secondary | ICD-10-CM

## 2015-12-10 DIAGNOSIS — N898 Other specified noninflammatory disorders of vagina: Secondary | ICD-10-CM

## 2015-12-10 LAB — GLUCOSE, CAPILLARY: GLUCOSE-CAPILLARY: 87 mg/dL (ref 65–99)

## 2015-12-10 NOTE — Assessment & Plan Note (Signed)
Overview She is requesting a refill of her Lyrica though would like it had a increased dose of 75 mg 3 times daily. Her dose was increased by her rheumatologist with whom she follows at [facility] per review of outside records.  Assessment Fibromyalgia well-controlled on current therapy  Plan -Informed the patient I would recheck to her PCP to assess the appropriateness of this dose increase to which she acknowledged understanding

## 2015-12-10 NOTE — Assessment & Plan Note (Addendum)
Overview For the past week, she acknowledges vaginal discharge which is yellow like a yeast infection. She also acknowledges increased urinary frequency, dysuria though denies any fever. She is sexually active with one female partner, and her most recent intercourse was about 5-6 days ago. She has no prior history of HIV and is due to see ID for for her hepatitis. She would like for me to represcribe antibiotic therapy today as her PCP prescribed metronidazole for bacterial vaginosis.  Assessment Vaginal discharge of unknown etiology. Her symptomatology also appears consistent with a urinary tract infection. Per review of records, it appears this is a common complaint for this patient, and I wonder if it is a consequence of immunosuppression. Her last A1c of 6.0 suggests good glycemic control.  Plan -Performed pelvic exam today -Check GC chlamydia, wet prep, urinalysis with reflex microscopy -Check HIV -Informed patient I would call her back with the appropriate prescription based off testing to which she is agreeable  ADDENDUM 12/14/2015  12:57 PM:  Workup is reassuring for noninfectious causes as evident by urinalysis without leukocytes, nitrites; unremarkable wet prep; negative GC/Chlamydia; negative HIV. If she remains symptomatic, we may need to refer her to gynecology or urology for further workup as her symptoms incorporated both the urinary and general areas.

## 2015-12-10 NOTE — Patient Instructions (Addendum)
I will give you a call about your results, so we prescribe you the right medication.

## 2015-12-10 NOTE — Progress Notes (Signed)
   Subjective:    Patient ID: Lorraine [name], female    DOB: 07/08/1961, 55 y.o.   MRN: [id number]  HPI Lorraine [name] is a 55 year old female with chronic HBV/HCV, non-insulin dependent Type 2 daibetes who presents today for vaginal discharge. Please see assessment & plan for status of chronic medical problems.    Review of Systems  Constitutional: Negative for fever.  Genitourinary: Positive for dysuria, urgency, frequency and vaginal discharge. Negative for flank pain, decreased urine volume, vaginal bleeding and vaginal pain.       Objective:   Physical Exam  Constitutional: She is oriented to person, place, and time. She appears well-developed and well-nourished. No distress.  HENT:  Head: Normocephalic and atraumatic.  Eyes: Conjunctivae are normal. No scleral icterus.  Neck: No tracheal deviation present.  Cardiovascular: Normal rate and regular rhythm.  Exam reveals no gallop and no friction rub.   No murmur heard. Pulmonary/Chest: Effort normal and breath sounds normal. No stridor. No respiratory distress. She has no wheezes. She has no rales.  Genitourinary:  Pelvic exam notable for absence of cervix. Milky white discharge noted in the vaginal wall which appeared to be physiologic. No lesions noted otherwise. Vaginal meatus intact.  Neurological: She is alert and oriented to person, place, and time.  Skin: Skin is warm and dry. She is not diaphoretic.  Psychiatric: She has a normal mood and affect. Her behavior is normal.          Assessment & Plan:

## 2015-12-11 LAB — URINALYSIS, ROUTINE W REFLEX MICROSCOPIC
BILIRUBIN UA: NEGATIVE
GLUCOSE, UA: NEGATIVE
KETONES UA: NEGATIVE
LEUKOCYTES UA: NEGATIVE
Nitrite, UA: NEGATIVE
PROTEIN UA: NEGATIVE
RBC UA: NEGATIVE
SPEC GRAV UA: 1.016 (ref 1.005–1.030)
UUROB: 0.2 mg/dL (ref 0.2–1.0)
pH, UA: 5.5 (ref 5.0–7.5)

## 2015-12-11 LAB — URINE CYTOLOGY ANCILLARY ONLY
CHLAMYDIA, DNA PROBE: NEGATIVE
Neisseria Gonorrhea: NEGATIVE

## 2015-12-11 LAB — HIV ANTIBODY (ROUTINE TESTING W REFLEX): HIV SCREEN 4TH GENERATION: NONREACTIVE

## 2015-12-11 NOTE — Progress Notes (Signed)
Internal Medicine Clinic Attending  Case discussed with Dr. Patel,Rushil at the time of the visit.  We reviewed the resident's history and exam and pertinent patient test results.  I agree with the assessment, diagnosis, and plan of care documented in the resident's note.  

## 2015-12-12 LAB — CERVICOVAGINAL ANCILLARY ONLY: Wet Prep (BD Affirm): NEGATIVE

## 2015-12-14 ENCOUNTER — Telehealth: Payer: Self-pay | Admitting: Internal Medicine

## 2015-12-14 NOTE — Progress Notes (Signed)
Patient reports that symptoms improved, no further complaints.

## 2015-12-14 NOTE — Telephone Encounter (Signed)
  Reason for call:   I placed an outgoing call to [name] at 3:09  PM regarding her recent labwork.   Assessment/ Plan:   I explained to her that her workup was reassuring for no infectious causes. She is reassured to hear these results and also acknowledges improvement in her symptoms since her office visit.  I suggested she try lubricant the next time she is intimate with her partner as this sometimes may mimic the symptoms she was experiencing to which she acknowledged agreement.  I also explained to her that she should speak with her rheumatologist at [facility] with regards to a refill for Lyrica at an increased dose.  As always, pt is advised that if symptoms worsen or new symptoms arise, they should go to an urgent care facility or to to ER for further evaluation.   Riccardo Dubin, MD   12/14/2015, 3:09 PM

## 2015-12-15 ENCOUNTER — Ambulatory Visit (INDEPENDENT_AMBULATORY_CARE_PROVIDER_SITE_OTHER): Payer: Medicaid Other | Admitting: Internal Medicine

## 2015-12-15 ENCOUNTER — Encounter: Payer: Self-pay | Admitting: Internal Medicine

## 2015-12-15 VITALS — BP 154/91 | HR 64 | Temp 98.1°F | Ht 63.0 in | Wt 132.0 lb

## 2015-12-15 DIAGNOSIS — B182 Chronic viral hepatitis C: Secondary | ICD-10-CM
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
## 2015-12-16 DIAGNOSIS — B182 Chronic viral hepatitis C: Secondary | ICD-10-CM | POA: Insufficient documentation

## 2015-12-16 LAB — HIV ANTIBODY (ROUTINE TESTING W REFLEX): HIV 1&2 Ab, 4th Generation: NONREACTIVE

## 2015-12-16 LAB — COMPREHENSIVE METABOLIC PANEL
ALK PHOS: 80 U/L (ref 33–130)
ALT: 41 U/L — AB (ref 6–29)
AST: 200 U/L — ABNORMAL HIGH (ref 10–35)
Albumin: 4 g/dL (ref 3.6–5.1)
BUN: 11 mg/dL (ref 7–25)
CHLORIDE: 101 mmol/L (ref 98–110)
CO2: 29 mmol/L (ref 20–31)
CREATININE: 0.76 mg/dL (ref 0.50–1.05)
Calcium: 9.3 mg/dL (ref 8.6–10.4)
GLUCOSE: 105 mg/dL — AB (ref 65–99)
POTASSIUM: 3.9 mmol/L (ref 3.5–5.3)
SODIUM: 138 mmol/L (ref 135–146)
TOTAL PROTEIN: 7.6 g/dL (ref 6.1–8.1)
Total Bilirubin: 0.3 mg/dL (ref 0.2–1.2)

## 2015-12-16 LAB — HEPATITIS C RNA QUANTITATIVE
HCV QUANT LOG: 6.39 {Log} — AB (ref <1.18)
HCV Quantitative: 2476924 IU/mL — ABNORMAL HIGH (ref <15)

## 2015-12-16 LAB — AFP TUMOR MARKER: AFP TUMOR MARKER: 4.4 ng/mL (ref <6.1)

## 2015-12-19 LAB — LIVER FIBROSIS, FIBROTEST-ACTITEST
ALT: 38 U/L — ABNORMAL HIGH (ref 6–29)
Alpha-2-Macroglobulin: 476 mg/dL — ABNORMAL HIGH (ref 106–279)
Apolipoprotein A1: 200 mg/dL — ABNORMAL HIGH (ref 101–198)
Bilirubin: 0.2 mg/dL (ref 0.2–1.2)
Fibrosis Score: 0.5
GGT: 45 U/L (ref 3–70)
HAPTOGLOBIN: 32 mg/dL — AB (ref 43–212)
Necroinflammat ACT Score: 0.26
Reference ID: 1517636

## 2015-12-21 ENCOUNTER — Other Ambulatory Visit: Payer: Self-pay | Admitting: Internal Medicine

## 2015-12-21 NOTE — Telephone Encounter (Signed)
Have received multiple phone calls from [facility] pharmacy regarding Lyrica prescription due to patient request for refill. Per [facility] the patient has filled Lyrica 50 mg. #90 on 10/09/15, 11/02/15, 11/27/15. Also, patient has filled Lyrica 75 mg. #30 on 11/20/15 that was prescribed by Dr. Maryjean Ka at [facility]. Told pharmacy that patient should not need a refill at this time, but that I would let MD know.

## 2015-12-22 NOTE — Telephone Encounter (Signed)
Pt wants to talk to nurse about med

## 2015-12-22 NOTE — Telephone Encounter (Signed)
Patient states that her rheumatologist's office deferred the refill to [place]. Advised the patient to call her rheumatologist back and let them know that we were no longer managing this medication for her and that if they needed to they could call [facility].

## 2015-12-22 NOTE — Telephone Encounter (Signed)
Ms. Nilsson gets her lyrica filled by her rheumatologist in [place]. Please advise her to discuss this with him. When she was last seen in clinic by Dr. Shawnie Pons she was advised to see him for additional refills of lyrica when she requested it. Thanks.   Julious Oka, MD Internal Medicine Resident, PGY [place] Pager: [phone]

## 2015-12-28 ENCOUNTER — Telehealth: Payer: Self-pay | Admitting: *Deleted

## 2015-12-28 NOTE — Telephone Encounter (Signed)
Patient notified of appt for ultrasound on 01/20/16 at 10:45 AM at [facility]. Nothing to eat or drink after midnight. Prior Auth # [id number] Lorraine [name]

## 2015-12-29 ENCOUNTER — Other Ambulatory Visit: Payer: Self-pay | Admitting: Pharmacist Clinician (PhC)/ Clinical Pharmacy Specialist

## 2015-12-29 ENCOUNTER — Telehealth: Payer: Self-pay | Admitting: *Deleted

## 2015-12-29 MED ORDER — LEDIPASVIR-SOFOSBUVIR 90-400 MG PO TABS: 1.0000 | ORAL_TABLET | Freq: Every day | ORAL | Status: DC

## 2015-12-29 NOTE — Telephone Encounter (Signed)
Received PA request from pt's pharmacy. I contacted pharmacy and patient for additional info and was informed no PA was necessary and pt has not had any problems obtaining medication.  Phone call complete.[name]5/23/20173:17 PM

## 2015-12-31 ENCOUNTER — Telehealth: Payer: Self-pay | Admitting: Pharmacy Technician

## 2015-12-31 NOTE — Telephone Encounter (Signed)
Medicaid denied Harvoni for [employer].  She must be in counseling or a treatment program for alcoholism.  I spoke with Leveda Anna, who only counsels HIV patients and she gave me info. On [facility] which has weekly meetings.  I then called Ms. Habegger with the info.  She asked how many meetings she will have to attend. I didn't know,but offered to give her the phone number.  She said she will call me back for the phone number today.

## 2016-01-01 NOTE — Telephone Encounter (Signed)
Patient called and advised she was at [facility] and wanted to speak with [facility]. Advised she is not in and will give her a message to call once she returns on Tuesday 01/05/16 as the office is closed on Monday. She was fine with that and just wants a call to coordinate the medication (?).

## 2016-01-05 NOTE — Telephone Encounter (Signed)
I spoke w/Lorraine [name] and she will get an apppointment with [facility] tomorrow.  Thanks

## 2016-01-20 ENCOUNTER — Ambulatory Visit (HOSPITAL_COMMUNITY)
Admission: RE | Admit: 2016-01-20 | Discharge: 2016-01-20 | Disposition: A | Discharge status: Home / Self Care | Payer: Medicaid Other | Source: Ambulatory Visit | Attending: Internal Medicine | Admitting: Internal Medicine

## 2016-01-20 DIAGNOSIS — B182 Chronic viral hepatitis C: Secondary | ICD-10-CM | POA: Diagnosis not present

## 2016-01-26 ENCOUNTER — Other Ambulatory Visit: Payer: Self-pay | Admitting: Pharmacist Clinician (PhC)/ Clinical Pharmacy Specialist

## 2016-01-26 ENCOUNTER — Other Ambulatory Visit: Payer: Self-pay | Admitting: Internal Medicine

## 2016-01-26 ENCOUNTER — Other Ambulatory Visit: Payer: Medicaid Other

## 2016-01-26 DIAGNOSIS — B182 Chronic viral hepatitis C: Secondary | ICD-10-CM

## 2016-01-27 NOTE — Telephone Encounter (Signed)
Called to pharmacy 

## 2016-02-02 LAB — PAIN MGMT, PROFILE 1 W/O CONF, U
Amphetamines: NEGATIVE ng/mL (ref <500)
BARBITURATES: NEGATIVE ng/mL (ref <300)
Benzodiazepines: NEGATIVE ng/mL (ref <100)
COCAINE METABOLITE: NEGATIVE ng/mL (ref <150)
Creatinine: 38.6 mg/dL (ref >=20.0)
MARIJUANA METABOLITE: NEGATIVE ng/mL (ref <20)
METHADONE METABOLITE: NEGATIVE ng/mL (ref <100)
OPIATES: POSITIVE ng/mL — AB (ref <100)
OXYCODONE: POSITIVE ng/mL — AB (ref <100)
Oxidant: NEGATIVE ug/mL (ref <200)
PHENCYCLIDINE: NEGATIVE ng/mL (ref <25)
pH: 7.33 (ref 4.5–9.0)

## 2016-02-04 LAB — ALCOHOL, ETHYL QN, RANDOM UR: ALCOHOL, ETHYL, QN, RANDOM UR: NOT DETECTED mg/dL

## 2016-02-10 ENCOUNTER — Encounter (INDEPENDENT_AMBULATORY_CARE_PROVIDER_SITE_OTHER): Payer: Medicaid Other | Admitting: Podiatry

## 2016-02-10 ENCOUNTER — Ambulatory Visit (INDEPENDENT_AMBULATORY_CARE_PROVIDER_SITE_OTHER): Payer: Medicaid Other | Admitting: Internal Medicine

## 2016-02-10 ENCOUNTER — Encounter: Payer: Self-pay | Admitting: Internal Medicine

## 2016-02-10 ENCOUNTER — Encounter: Payer: Self-pay | Admitting: Pharmacy Technician

## 2016-02-10 VITALS — BP 111/72 | HR 67 | Temp 98.1°F | Ht 63.0 in | Wt 132.0 lb

## 2016-02-10 DIAGNOSIS — K219 Gastro-esophageal reflux disease without esophagitis: Secondary | ICD-10-CM | POA: Diagnosis not present

## 2016-02-10 DIAGNOSIS — R7401 Elevation of levels of liver transaminase levels: Secondary | ICD-10-CM

## 2016-02-10 DIAGNOSIS — R740 Nonspecific elevation of levels of transaminase and lactic acid dehydrogenase [LDH]: Secondary | ICD-10-CM

## 2016-02-10 DIAGNOSIS — B182 Chronic viral hepatitis C: Secondary | ICD-10-CM | POA: Diagnosis present

## 2016-02-10 MED FILL — HARVONI 90-400 MG TABLET: 90-400 | 28 days supply | Qty: 28 | Fill #0

## 2016-02-10 NOTE — Progress Notes (Signed)
This encounter was created in error - please disregard.
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
## 2016-03-02 ENCOUNTER — Ambulatory Visit: Payer: Medicaid Other | Admitting: Podiatry

## 2016-03-08 MED FILL — HARVONI 90-400 MG TABLET: 90-400 | 28 days supply | Qty: 28 | Fill #1

## 2016-03-09 ENCOUNTER — Other Ambulatory Visit: Payer: Medicaid Other

## 2016-03-09 ENCOUNTER — Ambulatory Visit (INDEPENDENT_AMBULATORY_CARE_PROVIDER_SITE_OTHER): Payer: Medicaid Other | Admitting: Pharmacist Clinician (PhC)/ Clinical Pharmacy Specialist

## 2016-03-09 DIAGNOSIS — B182 Chronic viral hepatitis C: Secondary | ICD-10-CM | POA: Diagnosis present

## 2016-03-09 NOTE — Patient Instructions (Signed)
Hep C VL and CMP today F/u with Dr. Baxter Flattery in Oct
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
## 2016-03-10 LAB — COMPLETE METABOLIC PANEL WITH GFR
ALBUMIN: 3.6 g/dL (ref 3.6–5.1)
ALK PHOS: 87 U/L (ref 33–130)
ALT: 11 U/L (ref 6–29)
AST: 130 U/L — AB (ref 10–35)
BUN: 8 mg/dL (ref 7–25)
CALCIUM: 9.1 mg/dL (ref 8.6–10.4)
CO2: 27 mmol/L (ref 20–31)
CREATININE: 0.82 mg/dL (ref 0.50–1.05)
Chloride: 104 mmol/L (ref 98–110)
GFR, Est African American: >89 mL/min (ref >=60)
GFR, Est Non African American: 80 mL/min (ref >=60)
GLUCOSE: 71 mg/dL (ref 65–99)
POTASSIUM: 4 mmol/L (ref 3.5–5.3)
SODIUM: 137 mmol/L (ref 135–146)
TOTAL PROTEIN: 7.3 g/dL (ref 6.1–8.1)
Total Bilirubin: 0.4 mg/dL (ref 0.2–1.2)

## 2016-03-11 LAB — HEPATITIS C RNA QUANTITATIVE: HCV Quantitative: NOT DETECTED IU/mL (ref <15)

## 2016-03-18 ENCOUNTER — Telehealth: Payer: Self-pay | Admitting: Pharmacist Clinician (PhC)/ Clinical Pharmacy Specialist

## 2016-03-18 ENCOUNTER — Telehealth: Payer: Self-pay

## 2016-03-18 NOTE — Telephone Encounter (Signed)
Lorraine [name] called about some stomach issue with Harvoni. She is no longer taking prilosec and wanted to try some pepto bismo. Didn't see an interaction in the checker but told her to separate it out by 4 hrs to be safe.

## 2016-03-18 NOTE — Telephone Encounter (Signed)
Patient called complaining that hep C medication is still making her sick. Pharmacist notified. Patient informed that pharmacist will call her back once he is free. Patient stated understanding. Rodman Key, LPN

## 2016-03-30 ENCOUNTER — Ambulatory Visit: Payer: Medicaid Other

## 2016-03-31 ENCOUNTER — Other Ambulatory Visit (HOSPITAL_COMMUNITY)
Admission: RE | Admit: 2016-03-31 | Discharge: 2016-03-31 | Disposition: A | Discharge status: Home / Self Care | Payer: Medicaid Other | Source: Ambulatory Visit | Attending: Student in an Organized Health Care Education/Training Program | Admitting: Student in an Organized Health Care Education/Training Program

## 2016-03-31 ENCOUNTER — Ambulatory Visit: Payer: Medicaid Other

## 2016-03-31 ENCOUNTER — Ambulatory Visit (INDEPENDENT_AMBULATORY_CARE_PROVIDER_SITE_OTHER): Payer: Medicaid Other | Admitting: Internal Medicine

## 2016-03-31 VITALS — BP 140/77 | HR 63 | Temp 97.8°F | Ht 63.0 in | Wt 136.4 lb

## 2016-03-31 DIAGNOSIS — R300 Dysuria: Secondary | ICD-10-CM

## 2016-03-31 DIAGNOSIS — N898 Other specified noninflammatory disorders of vagina: Secondary | ICD-10-CM

## 2016-03-31 DIAGNOSIS — B351 Tinea unguium: Secondary | ICD-10-CM

## 2016-03-31 DIAGNOSIS — Z23 Encounter for immunization: Secondary | ICD-10-CM | POA: Diagnosis not present

## 2016-03-31 DIAGNOSIS — N760 Acute vaginitis: Secondary | ICD-10-CM | POA: Diagnosis present

## 2016-03-31 DIAGNOSIS — Z113 Encounter for screening for infections with a predominantly sexual mode of transmission: Secondary | ICD-10-CM | POA: Diagnosis present

## 2016-03-31 DIAGNOSIS — B373 Candidiasis of vulva and vagina: Secondary | ICD-10-CM | POA: Diagnosis not present

## 2016-03-31 DIAGNOSIS — Z01419 Encounter for gynecological examination (general) (routine) without abnormal findings: Secondary | ICD-10-CM | POA: Insufficient documentation

## 2016-03-31 MED ORDER — FLUCONAZOLE 150 MG PO TABS: 150.0000 mg | ORAL_TABLET | ORAL | 0 refills | Status: DC
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
## 2016-03-31 NOTE — Patient Instructions (Signed)
Take Fluconazole 1 pill every 3 days for 3 doses. You are likely experiencing a recurrent yeast infection. If your symptoms do not improve, please come back to the clinic.

## 2016-03-31 NOTE — Assessment & Plan Note (Signed)
Patient admits to a one week history of white vaginal discharge associated with pruritus and irritation, dysuria, increased urinary frequency, and right flank pain. She denies painful vaginal/genital lesions, fever, chills, nausea, vomiting or diarrhea. She has a history of recurrent vaginal candidiasis in the past. She has had the same sexual partner, but is unsure of his monogamy. Her flank pain is worse with ambulation and better with rest. She denies any radiation of pain into her groin region. UA dipstick shows negative nitrites and leukocytes, negative blood, negative protein, negative glucose. No concerning signs of infection. Dysuria is likely due to yeast infection. Flank pain is likely musculoskeletal.  Assessment: Vaginal Candidiasis  Plan: -Sent for UA -Treat for vaginal candidiasis

## 2016-03-31 NOTE — Assessment & Plan Note (Signed)
Patient requests referral to podiatry

## 2016-03-31 NOTE — Assessment & Plan Note (Signed)
Received flu vaccination.

## 2016-03-31 NOTE — Assessment & Plan Note (Addendum)
Patient admits to a one week history of white vaginal discharge associated with pruritus and irritation, dysuria, increased urinary frequency, and right flank pain. She denies painful vaginal/genital lesions, fever, chills, nausea, vomiting or diarrhea. She has a history of recurrent vaginal candidiasis in the past. She has had the same sexual partner, but is unsure of his monogamy. Her flank pain is worse with ambulation and better with rest. She denies any radiation of pain into her groin region. Pelvic exam showed a normal appearing vulva with no masses, tenderness or lesions, Normal appearing vagina with normal color and scant white discharge, no lesions, no identifiable cervix,  uterus is surgically absent, vaginal cuff well healed.   Assessment: Vaginal Candidiasis  Plan: -Sent of GC/Chlamydia, Wet Prep -Treat with Fluconazole 150 mg po Q72H for 3 doses  ADDENDUM: positive for BV, will send in Flagyl 500 mg po BID for 7 days

## 2016-04-01 ENCOUNTER — Encounter: Payer: Self-pay | Admitting: Internal Medicine

## 2016-04-01 LAB — CERVICOVAGINAL ANCILLARY ONLY
CHLAMYDIA, DNA PROBE: NEGATIVE
NEISSERIA GONORRHEA: NEGATIVE
Wet Prep (BD Affirm): POSITIVE — AB

## 2016-04-01 LAB — URINALYSIS, ROUTINE W REFLEX MICROSCOPIC
Bilirubin, UA: NEGATIVE
Glucose, UA: NEGATIVE
Ketones, UA: NEGATIVE
LEUKOCYTES UA: NEGATIVE
Nitrite, UA: NEGATIVE
Protein, UA: NEGATIVE
RBC UA: NEGATIVE
SPEC GRAV UA: 1.018 (ref 1.005–1.030)
Urobilinogen, Ur: 1 mg/dL (ref 0.2–1.0)
pH, UA: 5.5 (ref 5.0–7.5)

## 2016-04-01 LAB — CYTOLOGY - PAP

## 2016-04-01 NOTE — Progress Notes (Signed)
Internal Medicine Clinic Attending  Case discussed with Dr. Burns at the time of the visit.  We reviewed the resident's history and exam and pertinent patient test results.  I agree with the assessment, diagnosis, and plan of care documented in the resident's note.  

## 2016-04-04 ENCOUNTER — Telehealth: Payer: Self-pay | Admitting: *Deleted

## 2016-04-04 MED ORDER — METRONIDAZOLE 500 MG PO TABS: 500.0000 mg | ORAL_TABLET | Freq: Two times a day (BID) | ORAL | 0 refills | Status: DC

## 2016-04-04 MED FILL — HARVONI 90-400 MG TABLET: 90-400 | 28 days supply | Qty: 28 | Fill #2

## 2016-04-04 NOTE — Telephone Encounter (Signed)
-----   Message from Florinda Marker, MD sent at 04/04/2016  9:50 AM EDT ----- Regarding: Patient Call Hi Lorraine [name],  Can you please help me call this patient to notify her that I sent in a prescription for Metronidazole 500 mg twice a day for 7 days? Her results came back positive for bacterial vaginosis (not an STD- if she asks). I tried to call her, but could not get through.   Thank you! [place]

## 2016-04-04 NOTE — Addendum Note (Signed)
Addended by: Martyn Malay R on: 04/04/2016 09:49 AM   Modules accepted: Orders

## 2016-04-04 NOTE — Telephone Encounter (Signed)
Pt states she has already pick up medication. Explain to pt to take med twice a day x 7 days -she has bacterial vaginosis, not an STD per Dr Quay Burow but bacterial infection. Pt voiced understanding.

## 2016-04-04 NOTE — Telephone Encounter (Signed)
Pt called/no answer - "number cannot be completed at this time".

## 2016-04-12 ENCOUNTER — Ambulatory Visit (INDEPENDENT_AMBULATORY_CARE_PROVIDER_SITE_OTHER): Payer: Medicaid Other | Admitting: Internal Medicine

## 2016-04-12 VITALS — BP 131/75 | HR 66 | Temp 97.8°F | Ht 63.0 in | Wt 134.2 lb

## 2016-04-12 DIAGNOSIS — R300 Dysuria: Secondary | ICD-10-CM | POA: Diagnosis not present

## 2016-04-12 DIAGNOSIS — N898 Other specified noninflammatory disorders of vagina: Secondary | ICD-10-CM

## 2016-04-12 DIAGNOSIS — N941 Unspecified dyspareunia: Secondary | ICD-10-CM

## 2016-04-12 LAB — POCT URINALYSIS DIPSTICK
Glucose, UA: NEGATIVE
Leukocytes, UA: NEGATIVE
NITRITE UA: NEGATIVE
PH UA: 5.5
RBC UA: NEGATIVE
SPEC GRAV UA: 1.025
Urobilinogen, UA: 1

## 2016-04-12 MED ORDER — ESTROGENS, CONJUGATED 0.625 MG/GM VA CREA: 1.0000 | TOPICAL_CREAM | Freq: Every day | VAGINAL | Status: DC

## 2016-04-12 NOTE — Patient Instructions (Addendum)
It was pleasure taking care of you today. Your symptoms might be due to vaginal dryness which is common after menopause.Please apply that estrogen cream daily. Your urine is not showing any infection. Drink plenty of fluids and cranberry juice to help with your burning while peeing.

## 2016-04-12 NOTE — Assessment & Plan Note (Signed)
She now c/o burning micturition for the last one week, it's only during peeing,denies any pelvic pain, urgency, frequency, fever,chills, N/V. Also c/o mild intermittent flank pain and also admits having mild dyspareunia which started recently.She states that her urine is dark yellow in color and she is drinking just 1-2 glasses of water each day. On exam She had mild tenderness just below her right flank area, which looks more like musculoskeletal in origin. Her urine Dipstick in negative for any leukocytes, nitrate and she has dark yellow color urine. She has traces of protein and ketone in her urine. -Might be due to concentrated urine,we will recheck during next appointment. -Her dysuria and dyspareunia might be due to post menopausal vaginal atrophy. Plan. -Premarin vaginal cream -She was advised to drink more water and try some cranberry juice. Repeat UA on next F/U.
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
## 2016-04-12 NOTE — Assessment & Plan Note (Signed)
Her vaginal discharge has improved after treatment with flagyl and fluconazole.

## 2016-04-13 NOTE — Progress Notes (Signed)
Internal Medicine Clinic Attending  I saw and evaluated the patient.  I personally confirmed the key portions of the history and exam documented by Dr. Amin and I reviewed pertinent patient test results.  The assessment, diagnosis, and plan were formulated together and I agree with the documentation in the resident's note. 

## 2016-04-14 ENCOUNTER — Telehealth: Payer: Self-pay | Admitting: *Deleted

## 2016-04-14 MED ORDER — ESTROGENS, CONJUGATED 0.625 MG/GM VA CREA: 1.0000 | TOPICAL_CREAM | Freq: Every day | VAGINAL | 2 refills | Status: DC

## 2016-04-14 NOTE — Telephone Encounter (Signed)
rx for premarin cream never received by pharmacy, will send to attending to review.[name]9/7/20173:26 PM

## 2016-04-14 NOTE — Telephone Encounter (Signed)
Refill not sent to pharmacy appropriately, so filling now.

## 2016-04-14 NOTE — Telephone Encounter (Signed)
Rx placed.

## 2016-04-14 NOTE — Addendum Note (Signed)
Addended by: Marcelino Duster on: 04/14/2016 09:50 AM   Modules accepted: Orders

## 2016-04-25 ENCOUNTER — Other Ambulatory Visit: Payer: Self-pay | Admitting: Internal Medicine

## 2016-05-10 ENCOUNTER — Encounter: Payer: Self-pay | Admitting: *Deleted

## 2016-05-11 ENCOUNTER — Telehealth: Payer: Self-pay | Admitting: Internal Medicine

## 2016-05-11 NOTE — Telephone Encounter (Signed)
Pt would like some medicine for a Yeast infection.

## 2016-05-11 NOTE — Telephone Encounter (Signed)
Called pt - stated having slight white discharge w/some burning. Asked if she has been using Premarin cream stated "Yes I think so - med in a tube, yes for 7 days".  Has an appt at [facility] on Monday - would like an appt same day - appt scheduled Mon in [facility] @ 1045 AM.

## 2016-05-16 ENCOUNTER — Ambulatory Visit (INDEPENDENT_AMBULATORY_CARE_PROVIDER_SITE_OTHER): Payer: Medicaid Other | Admitting: Internal Medicine

## 2016-05-16 ENCOUNTER — Other Ambulatory Visit (HOSPITAL_COMMUNITY)
Admission: RE | Admit: 2016-05-16 | Discharge: 2016-05-16 | Disposition: A | Discharge status: Home / Self Care | Payer: Medicaid Other | Source: Ambulatory Visit | Attending: Internal Medicine | Admitting: Internal Medicine

## 2016-05-16 ENCOUNTER — Encounter: Payer: Self-pay | Admitting: Internal Medicine

## 2016-05-16 VITALS — BP 143/82 | Temp 98.0°F | Wt 134.4 lb

## 2016-05-16 VITALS — BP 154/89 | HR 58 | Temp 97.8°F | Wt 134.0 lb

## 2016-05-16 DIAGNOSIS — Z8742 Personal history of other diseases of the female genital tract: Secondary | ICD-10-CM

## 2016-05-16 DIAGNOSIS — N898 Other specified noninflammatory disorders of vagina: Secondary | ICD-10-CM | POA: Diagnosis present

## 2016-05-16 DIAGNOSIS — B182 Chronic viral hepatitis C: Secondary | ICD-10-CM | POA: Diagnosis not present

## 2016-05-16 DIAGNOSIS — R300 Dysuria: Secondary | ICD-10-CM

## 2016-05-16 DIAGNOSIS — Z113 Encounter for screening for infections with a predominantly sexual mode of transmission: Secondary | ICD-10-CM | POA: Diagnosis present

## 2016-05-16 LAB — COMPLETE METABOLIC PANEL WITH GFR
ALT: 9 U/L (ref 6–29)
AST: 149 U/L — AB (ref 10–35)
Albumin: 3.9 g/dL (ref 3.6–5.1)
Alkaline Phosphatase: 81 U/L (ref 33–130)
BUN: 12 mg/dL (ref 7–25)
CALCIUM: 9.3 mg/dL (ref 8.6–10.4)
CHLORIDE: 104 mmol/L (ref 98–110)
CO2: 25 mmol/L (ref 20–31)
Creat: 0.73 mg/dL (ref 0.50–1.05)
GFR, Est African American: >89 mL/min (ref >=60)
GFR, Est Non African American: >89 mL/min (ref >=60)
Glucose, Bld: 70 mg/dL (ref 65–99)
POTASSIUM: 3.9 mmol/L (ref 3.5–5.3)
SODIUM: 138 mmol/L (ref 135–146)
Total Bilirubin: 0.3 mg/dL (ref 0.2–1.2)
Total Protein: 7.6 g/dL (ref 6.1–8.1)

## 2016-05-16 MED ORDER — FLUCONAZOLE 150 MG PO TABS: 150.0000 mg | ORAL_TABLET | ORAL | 0 refills | Status: DC
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
## 2016-05-16 NOTE — Progress Notes (Signed)
Patient ID: HAELEE [name], female   DOB: August 22, 1960, 55 y.o.   MRN: [id number]  HPI Lorraine [name] is a 55yo F with chronic hep C without hepatic coma. She finished harvoni on 10/5. She tolerated her medicatios without difficulty. She is here for follow up. She is currently without any complaints  Outpatient Encounter Prescriptions as of 05/16/2016  Medication Sig  . amLODipine (NORVASC) 5 MG tablet Take 1 tablet (5 mg total) by mouth daily.  Marland Kitchen conjugated estrogens (PREMARIN) vaginal cream Place 1 Applicatorful vaginally daily. Use for 21 days and then stop for 7 days and repeat.  . diphenhydrAMINE (BENADRYL) 25 mg capsule Take 25 mg by mouth 2 (two) times daily as needed for allergies. Reported on 08/27/2015  . etanercept (ENBREL) 50 MG/ML injection Inject 50 mg into the skin once a week. On Mon.  . fluconazole (DIFLUCAN) 150 MG tablet Take 1 tablet (150 mg total) by mouth every 3 (three) days.  Marland Kitchen glucose blood (ACCU-CHEK AVIVA PLUS) test strip 1 each by Other route See admin instructions. Use to check blood sugar 3 to 4 times daily. diag code E11.65. Insulin dependent  . glucose blood test strip 1 each by Other route 3 (three) times daily. Use as instructed  . Insulin Glargine (LANTUS) 100 UNIT/ML Solostar Pen Inject 10 Units into the skin at bedtime.  Marland Kitchen lisinopril (PRINIVIL,ZESTRIL) 20 MG tablet TAKE ONE (1) TABLET BY MOUTH EVERY DAY  . metroNIDAZOLE (FLAGYL) 500 MG tablet Take 1 tablet (500 mg total) by mouth 2 (two) times daily.  . Multiple Vitamins-Minerals (MULTIVITAMIN WITH MINERALS) tablet Take 1 tablet by mouth daily.  Marland Kitchen omeprazole (PRILOSEC) 20 MG capsule Take 1 capsule (20 mg total) by mouth daily. Take 30 minutes before breakfast.  . pravastatin (PRAVACHOL) 40 MG tablet Take 1 tablet (40 mg total) by mouth daily.  . pregabalin (LYRICA) 100 MG capsule Take 1 capsule (100 mg total) by mouth 3 (three) times daily.  . sitaGLIPtin (JANUVIA) 100 MG tablet Take 1 tablet (100 mg total) by mouth  daily.  . sodium chloride (OCEAN) 0.65 % SOLN nasal spray Place 1 spray into both nostrils as needed for congestion.  Marland Kitchen UNIFINE PENTIPS 31G X 5 MM MISC INJECT 1 EACH INTO THE SKIN AT BEDTIME  . [DISCONTINUED] Ledipasvir-Sofosbuvir (HARVONI) 90-400 MG TABS Take 1 tablet by mouth daily. Take at the same time as Prilosec on empty stomach   No facility-administered encounter medications on file as of 05/16/2016.      Patient Active Problem List   Diagnosis Date Noted  . Encounter for immunization 03/31/2016  . Chronic hepatitis C without hepatic coma ([place]) 12/16/2015  . Vaginal discharge 12/10/2015  . HLD (hyperlipidemia) 09/09/2015  . Fibromyalgia 09/09/2015  . Toe pain, left 04/16/2015  . Onychomycosis of toenail 10/14/2014  . Other and unspecified hyperlipidemia 03/31/2014  . Other long term (current) drug therapy 01/07/2014  . Transaminitis 01/02/2013  . 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 Preventative health care 04/25/2012  . Rheumatoid arthritis ([place]) 04/03/2012  . GERD (gastroesophageal reflux disease) 02/08/2012  . Poorly controlled type 2 diabetes mellitus ([place]) 08/18/2011  . Dysuria 08/18/2011  . Chronic hepatitis C virus infection ([place]) 08/25/2006  . TOBACCO USER 08/25/2006  . HEPATITIS B, HX OF 08/25/2006  . Essential hypertension 05/15/2006     Health Maintenance Due  Topic Date Due  . LIPID PANEL  12/07/2014  . HEMOGLOBIN A1C  02/24/2016     Review of Systems Review of Systems  Constitutional:  Negative for fever, chills, diaphoresis, activity change, appetite change, fatigue and unexpected weight change.  HENT: Negative for congestion, sore throat, rhinorrhea, sneezing, trouble swallowing and sinus pressure.  Eyes: Negative for photophobia and visual disturbance.  Respiratory: Negative for cough, chest tightness, shortness of breath, wheezing and stridor.  Cardiovascular: Negative for chest pain, palpitations and leg swelling.  Gastrointestinal: Negative for nausea,  vomiting, abdominal pain, diarrhea, constipation, blood in stool, abdominal distention and anal bleeding.  Genitourinary: Negative for dysuria, hematuria, flank pain and difficulty urinating.  Musculoskeletal: Negative for myalgias, back pain, joint swelling, arthralgias and gait problem.  Skin: Negative for color change, pallor, rash and wound.  Neurological: Negative for dizziness, tremors, weakness and light-headedness.  Hematological: Negative for adenopathy. Does not bruise/bleed easily.  Psychiatric/Behavioral: Negative for behavioral problems, confusion, sleep disturbance, dysphoric mood, decreased concentration and agitation.    Physical Exam   BP (!) 154/89   Pulse (!) 58   Temp 97.8 F (36.6 C) (Oral)   Wt 60.8 kg (134 lb)   BMI 23.74 kg/m   Physical Exam  Constitutional:  oriented to person, place, and time. appears well-developed and well-nourished. No distress.  HENT: [place]/AT, PERRLA, no scleral icterus Mouth/Throat: Oropharynx is clear and moist. No oropharyngeal exudate.  Cardiovascular: Normal rate, regular rhythm and normal heart sounds. Exam reveals no gallop and no friction rub.  No murmur heard.  Pulmonary/Chest: Effort normal and breath sounds normal. No respiratory distress.  has no wheezes.  Neck = supple, no nuchal rigidity Abdominal: Soft. Bowel sounds are normal.  exhibits no distension. There is no tenderness.  Lymphadenopathy: no cervical adenopathy. No axillary adenopathy Neurological: alert and oriented to person, place, and time.  Skin: Skin is warm and dry. No rash noted. No erythema.  Psychiatric: a normal mood and affect.  behavior is normal.   Lab Results  Component Value Date   HEPBSAB POS (A) 01/30/2014   No results found for: RPR  CBC Lab Results  Component Value Date   WBC 7.9 08/16/2015   RBC 4.85 08/16/2015   HGB 15.1 (H) 08/16/2015   HCT 42.9 08/16/2015   PLT 328 08/16/2015   MCV 88.5 08/16/2015   MCH 31.1 08/16/2015   MCHC 35.2  08/16/2015   RDW 12.4 08/16/2015   LYMPHSABS 2.8 01/30/2014   MONOABS 0.5 01/30/2014   EOSABS 0.1 01/30/2014   BASOSABS 0.0 01/30/2014   BMET Lab Results  Component Value Date   NA 137 03/09/2016   K 4.0 03/09/2016   CL 104 03/09/2016   CO2 27 03/09/2016   GLUCOSE 71 03/09/2016   BUN 8 03/09/2016   CREATININE 0.82 03/09/2016   CALCIUM 9.1 03/09/2016   GFRNONAA 80 03/09/2016   GFRAA >89 03/09/2016     Assessment and Plan  Chronic hepatitis c without hepatic coma = will check cmp, hep c vl. We will see back in 3 mo for svr 12 and repeat u/s  Essential hypertension = will have her follow up with PCP to ensure that it is well managed  Health maintenance = offered flu vaccine

## 2016-05-16 NOTE — Patient Instructions (Addendum)
For your pain with urination, we will treat with fluconazole for a yeast infection for now. Like before: Take one pill every 72 hours for three doses -take one today, take one on wednesday, and take one of Friday  I will call you with any changes to your medicines when we get the results back.   Please start using the estrogen cream; apply to your vaginal area once a day for three weeks, then take a 7 day break. Continue on this schedule.

## 2016-05-17 ENCOUNTER — Other Ambulatory Visit: Payer: Self-pay | Admitting: Internal Medicine

## 2016-05-17 DIAGNOSIS — Z1231 Encounter for screening mammogram for malignant neoplasm of breast: Secondary | ICD-10-CM

## 2016-05-17 LAB — CERVICOVAGINAL ANCILLARY ONLY
CHLAMYDIA, DNA PROBE: NEGATIVE
Neisseria Gonorrhea: NEGATIVE
Wet Prep (BD Affirm): POSITIVE — AB

## 2016-05-17 LAB — HEPATITIS C RNA QUANTITATIVE: HCV QUANT: NOT DETECTED [IU]/mL (ref <15)

## 2016-05-17 MED ORDER — METRONIDAZOLE 0.75 % EX GEL: 1.0000 "application " | CUTANEOUS | 1 refills | Status: DC

## 2016-05-17 MED ORDER — METRONIDAZOLE 500 MG PO TABS: 500.0000 mg | ORAL_TABLET | Freq: Two times a day (BID) | ORAL | 0 refills | Status: DC

## 2016-05-17 NOTE — Assessment & Plan Note (Addendum)
Patient with reports of thin, white vaginal discharge is association with dysuria. She has had both candidal and gardenella vaginitis infections over the last year. Exam reveals minimal discharge, and no concerning lesions or findings.  Plan: --wetprep and GC testing > gardenella + --Metronidazole 500mg  BID x7 days --Suppressive therapy with metronidazole gel 0.75% twice weekly for 6 months after completion of oral therapy --called patient and discussed results and treatment plan; patient stated understanding

## 2016-05-17 NOTE — Assessment & Plan Note (Signed)
Patient with 1 week of dysuria and thin, white discharge. Has had repeated candidal and gardenella infections over the last year. Pelvic and speculum exam are only positive for scant white discharge.  Plan: --UA was negative --Wet prep and GC testing pending --was prescribed fluconazole 150mg  q72 hrs x 3 doses for presumed candidal infection --depending on test results, may be a candidate for suppressive candidal or gardenalla treatment due to recurrent infections.

## 2016-05-18 ENCOUNTER — Telehealth: Payer: Self-pay | Admitting: Internal Medicine

## 2016-05-18 NOTE — Telephone Encounter (Signed)
Spoke w/ dr Jari Favre then gave pharm directions for use of flagyl

## 2016-05-18 NOTE — Progress Notes (Signed)
Internal Medicine Clinic Attending  I saw and evaluated the patient.  I personally confirmed the key portions of the history and exam documented by Dr. Svalina and I reviewed pertinent patient test results.  The assessment, diagnosis, and plan were formulated together and I agree with the documentation in the resident's note.  

## 2016-05-18 NOTE — Telephone Encounter (Signed)
Calling for clarification for medication

## 2016-05-24 ENCOUNTER — Telehealth: Payer: Self-pay | Admitting: Internal Medicine

## 2016-05-24 NOTE — Telephone Encounter (Signed)
Pt called states she was at [facility] and was told her levels were high... Transferred to triage

## 2016-05-25 NOTE — Telephone Encounter (Signed)
Spoke w/ pt, she did not understand about her liver and her disease and did not explain to the doctor at [facility], she will call dr snider's office and discuss her labs and the relation to her disease

## 2016-06-02 ENCOUNTER — Ambulatory Visit
Admission: RE | Admit: 2016-06-02 | Discharge: 2016-06-02 | Disposition: A | Discharge status: Home / Self Care | Payer: Medicaid Other | Source: Ambulatory Visit | Attending: Family Medicine | Admitting: Family Medicine

## 2016-06-02 DIAGNOSIS — Z1231 Encounter for screening mammogram for malignant neoplasm of breast: Secondary | ICD-10-CM

## 2016-06-14 ENCOUNTER — Ambulatory Visit (INDEPENDENT_AMBULATORY_CARE_PROVIDER_SITE_OTHER): Payer: Medicaid Other | Admitting: Internal Medicine

## 2016-06-14 ENCOUNTER — Encounter: Payer: Self-pay | Admitting: Internal Medicine

## 2016-06-14 DIAGNOSIS — R6883 Chills (without fever): Secondary | ICD-10-CM

## 2016-06-14 DIAGNOSIS — R0789 Other chest pain: Secondary | ICD-10-CM | POA: Diagnosis not present

## 2016-06-14 DIAGNOSIS — E119 Type 2 diabetes mellitus without complications: Secondary | ICD-10-CM | POA: Diagnosis not present

## 2016-06-14 DIAGNOSIS — J069 Acute upper respiratory infection, unspecified: Secondary | ICD-10-CM

## 2016-06-14 DIAGNOSIS — M069 Rheumatoid arthritis, unspecified: Secondary | ICD-10-CM

## 2016-06-14 DIAGNOSIS — F17210 Nicotine dependence, cigarettes, uncomplicated: Secondary | ICD-10-CM

## 2016-06-14 DIAGNOSIS — R0981 Nasal congestion: Secondary | ICD-10-CM | POA: Diagnosis not present

## 2016-06-14 DIAGNOSIS — R51 Headache: Secondary | ICD-10-CM

## 2016-06-14 DIAGNOSIS — J029 Acute pharyngitis, unspecified: Secondary | ICD-10-CM | POA: Diagnosis present

## 2016-06-14 DIAGNOSIS — R05 Cough: Secondary | ICD-10-CM

## 2016-06-14 NOTE — Patient Instructions (Signed)
General Instructions: - I think you most likely have a viral infection - Continue taking Mucinex as needed to help get the phlegm up - Can also try over the counter Tylenol cold/sinus to help with your congestion - Use cough drops, drink hot tea, and take hot showers to help your congestion - If your symptoms do not improve in the next week or if you develop fevers, worsening sputum production (yellow/green), or severe sinus pain then please return to clinic  Please bring your medicines with you each time you come to clinic.  Medicines may include prescription medications, over-the-counter medications, herbal remedies, eye drops, vitamins, or other pills.   Progress Toward Treatment Goals:  Treatment Goal 11/25/2015  Hemoglobin A1C improved  Blood pressure -  Stop smoking smoking the same amount    Self Care Goals & Plans:  Self Care Goal 12/10/2015  Manage my medications take my medicines as prescribed; bring my medications to every visit; refill my medications on time; follow the sick day instructions if I am sick  Monitor my health keep track of my blood glucose; bring my glucose meter and log to each visit; keep track of my blood pressure  Eat healthy foods drink diet soda or water instead of juice or soda; eat more vegetables; eat foods that are low in salt; eat baked foods instead of fried foods; eat fruit for snacks and desserts  Be physically active find an activity I enjoy  Stop smoking cut down the number of cigarettes smoked  Meeting treatment goals maintain the current self-care plan    Home Blood Glucose Monitoring 11/25/2015  Check my blood sugar no home glucose monitoring  When to check my blood sugar -     Care Management & Community Referrals:  Referral 10/08/2013  Referrals made for care management support none needed  Referrals made to community resources -
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
## 2016-06-14 NOTE — Assessment & Plan Note (Signed)
Her symptoms seem most consistent with a viral infection. Of course with her hx of DM and RA on Enbrel need to consider atypical infections but her vital signs are stable, she is afebrile, and no concerning features on exam. Recommended to continue Mucinex for sputum production, OTC Tylenol cold/sinus to help with congestion symptoms, and other home remedies such as cough drops to help sore throat, drinking hot tea, and taking warm showers. I gave her strict return precautions and she understands to return to clinic if she develops fevers, worsening sputum production, SOB, or her symptoms do not get better in 1 week.

## 2016-06-14 NOTE — Progress Notes (Signed)
Internal Medicine Clinic Attending  Case discussed with Dr. Rivet soon after the resident saw the patient.  We reviewed the resident's history and exam and pertinent patient test results.  I agree with the assessment, diagnosis, and plan of care documented in the resident's note.  

## 2016-06-15 ENCOUNTER — Encounter: Payer: Medicaid Other | Admitting: Internal Medicine

## 2016-06-16 ENCOUNTER — Emergency Department (HOSPITAL_COMMUNITY)
Admission: EM | Admit: 2016-06-16 | Discharge: 2016-06-16 | Disposition: A | Discharge status: Home / Self Care | Payer: Medicaid Other | Attending: Emergency Medicine | Admitting: Emergency Medicine

## 2016-06-16 ENCOUNTER — Encounter (HOSPITAL_COMMUNITY): Payer: Self-pay | Admitting: Emergency Medicine

## 2016-06-16 DIAGNOSIS — Z79899 Other long term (current) drug therapy: Secondary | ICD-10-CM | POA: Insufficient documentation

## 2016-06-16 DIAGNOSIS — E119 Type 2 diabetes mellitus without complications: Secondary | ICD-10-CM | POA: Diagnosis not present

## 2016-06-16 DIAGNOSIS — R040 Epistaxis: Secondary | ICD-10-CM | POA: Diagnosis not present

## 2016-06-16 DIAGNOSIS — Z794 Long term (current) use of insulin: Secondary | ICD-10-CM | POA: Diagnosis not present

## 2016-06-16 DIAGNOSIS — I10 Essential (primary) hypertension: Secondary | ICD-10-CM | POA: Insufficient documentation

## 2016-06-16 DIAGNOSIS — F17210 Nicotine dependence, cigarettes, uncomplicated: Secondary | ICD-10-CM | POA: Insufficient documentation

## 2016-06-16 MED ORDER — OXYMETAZOLINE HCL 0.05 % NA SOLN
1.0000 | Freq: Once | NASAL | Status: AC
  Administered 2016-06-16: 1 via NASAL
  Filled 2016-06-16: qty 15

## 2016-06-16 NOTE — Discharge Instructions (Signed)
Your nose is not currently bleeding. I have given you a spray of Afrin in each of your nostrils that will help with further nosebleed and congestion. I recommend a nasal saline spray at home to keep your nostrils moist. If you develop another nosebleed lean forward, and pinch her nose for 15 minutes. If the bleeding does not stop please return to the ED. You need to follow-up with your primary care doctor concerning an inhaler. I do not see that medication on your list and would like for you to be evaluated before I prescribed an inhaler. Take your blood pressure medicine when you get home. Return to the ED if your symptoms worsen
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
## 2016-06-16 NOTE — ED Triage Notes (Signed)
Patient woke up around 4 am and nose was bleeding. Patient states she was spitting up blood too but do not know where it is exactly coming from.

## 2016-06-18 ENCOUNTER — Encounter (HOSPITAL_COMMUNITY): Payer: Self-pay | Admitting: Emergency Medicine

## 2016-06-18 ENCOUNTER — Emergency Department (HOSPITAL_COMMUNITY)
Admission: EM | Admit: 2016-06-18 | Discharge: 2016-06-18 | Disposition: A | Discharge status: Home / Self Care | Payer: Medicaid Other | Attending: Emergency Medicine | Admitting: Emergency Medicine

## 2016-06-18 DIAGNOSIS — R040 Epistaxis: Secondary | ICD-10-CM | POA: Insufficient documentation

## 2016-06-18 DIAGNOSIS — Z794 Long term (current) use of insulin: Secondary | ICD-10-CM | POA: Diagnosis not present

## 2016-06-18 DIAGNOSIS — F17210 Nicotine dependence, cigarettes, uncomplicated: Secondary | ICD-10-CM | POA: Diagnosis not present

## 2016-06-18 DIAGNOSIS — E119 Type 2 diabetes mellitus without complications: Secondary | ICD-10-CM | POA: Diagnosis not present

## 2016-06-18 DIAGNOSIS — I10 Essential (primary) hypertension: Secondary | ICD-10-CM | POA: Diagnosis not present

## 2016-06-18 DIAGNOSIS — Z79899 Other long term (current) drug therapy: Secondary | ICD-10-CM | POA: Diagnosis not present

## 2016-06-18 MED ORDER — ONDANSETRON HCL 4 MG/2ML IJ SOLN
4.0000 mg | Freq: Once | INTRAMUSCULAR | Status: AC
  Administered 2016-06-18: 4 mg via INTRAVENOUS
  Filled 2016-06-18: qty 2

## 2016-06-18 MED ORDER — SULFAMETHOXAZOLE-TRIMETHOPRIM 800-160 MG PO TABS: 1.0000 | ORAL_TABLET | Freq: Two times a day (BID) | ORAL | 0 refills | Status: AC

## 2016-06-18 MED ORDER — SULFAMETHOXAZOLE-TRIMETHOPRIM 800-160 MG PO TABS
1.0000 | ORAL_TABLET | Freq: Once | ORAL | Status: AC
  Administered 2016-06-18: 1 via ORAL
  Filled 2016-06-18: qty 1

## 2016-06-18 MED ORDER — OXYCODONE-ACETAMINOPHEN 5-325 MG PO TABS
1.0000 | ORAL_TABLET | Freq: Once | ORAL | Status: AC
  Administered 2016-06-18: 1 via ORAL
  Filled 2016-06-18: qty 1

## 2016-06-18 MED ORDER — OXYMETAZOLINE HCL 0.05 % NA SOLN: 1.0000 | Freq: Once | NASAL | Status: DC

## 2016-06-18 MED ORDER — OXYCODONE-ACETAMINOPHEN 5-325 MG PO TABS: 1.0000 | ORAL_TABLET | ORAL | 0 refills | Status: DC | PRN

## 2016-06-18 NOTE — ED Notes (Signed)
Provider in room  
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
## 2016-06-18 NOTE — Discharge Instructions (Signed)
Keep the nasal tampons in place until you see the ENT doctor in 3-5 days. Return if bleeding is comes back.

## 2016-06-18 NOTE — ED Triage Notes (Signed)
Pt comes to ed via ems, c/o nose bleed for 1 hr before EMS arrival, est blood loss per ems verbalized 15ml \ C/o of nausea, and headache, and dizziness.  Medical hx of HTN, Diabetes 1,  Cbg114.   V/s on arrival 160/93, hr56, rr16, cbg114.  20 in LAC,  Pt comes from home, family on the way. daughter  Bleeding is controlled w manual pressure via ems interventions.

## 2016-06-18 NOTE — ED Notes (Signed)
Bed: WA06 Expected date:  Expected time:  Means of arrival:  Comments: 55 yo F/ Nosebleed

## 2016-06-21 ENCOUNTER — Other Ambulatory Visit: Payer: Self-pay | Admitting: Internal Medicine

## 2016-06-21 DIAGNOSIS — R040 Epistaxis: Secondary | ICD-10-CM

## 2016-06-27 ENCOUNTER — Emergency Department (HOSPITAL_COMMUNITY)
Admission: EM | Admit: 2016-06-27 | Discharge: 2016-06-27 | Disposition: A | Discharge status: Home / Self Care | Payer: Medicaid Other | Attending: Emergency Medicine | Admitting: Emergency Medicine

## 2016-06-27 ENCOUNTER — Emergency Department (HOSPITAL_COMMUNITY): Payer: Medicaid Other

## 2016-06-27 ENCOUNTER — Encounter (HOSPITAL_COMMUNITY): Payer: Self-pay | Admitting: Emergency Medicine

## 2016-06-27 DIAGNOSIS — I10 Essential (primary) hypertension: Secondary | ICD-10-CM | POA: Insufficient documentation

## 2016-06-27 DIAGNOSIS — R040 Epistaxis: Secondary | ICD-10-CM | POA: Diagnosis present

## 2016-06-27 DIAGNOSIS — E119 Type 2 diabetes mellitus without complications: Secondary | ICD-10-CM | POA: Diagnosis not present

## 2016-06-27 DIAGNOSIS — Z794 Long term (current) use of insulin: Secondary | ICD-10-CM | POA: Diagnosis not present

## 2016-06-27 DIAGNOSIS — Z79899 Other long term (current) drug therapy: Secondary | ICD-10-CM | POA: Diagnosis not present

## 2016-06-27 DIAGNOSIS — R0789 Other chest pain: Secondary | ICD-10-CM | POA: Diagnosis not present

## 2016-06-27 DIAGNOSIS — F17210 Nicotine dependence, cigarettes, uncomplicated: Secondary | ICD-10-CM | POA: Diagnosis not present

## 2016-06-27 MED ORDER — ACETAMINOPHEN 500 MG PO TABS
1000.0000 mg | ORAL_TABLET | Freq: Once | ORAL | Status: AC
  Administered 2016-06-27: 1000 mg via ORAL
  Filled 2016-06-27: qty 2

## 2016-06-27 MED ORDER — LIDOCAINE-EPINEPHRINE 1 %-1:100000 IJ SOLN
20.0000 mL | Freq: Once | INTRAMUSCULAR | Status: AC
  Administered 2016-06-27: 20 mL via INTRADERMAL
  Filled 2016-06-27: qty 1

## 2016-06-27 MED ORDER — IBUPROFEN 800 MG PO TABS
800.0000 mg | ORAL_TABLET | Freq: Once | ORAL | Status: AC
  Administered 2016-06-27: 800 mg via ORAL
  Filled 2016-06-27: qty 1

## 2016-06-27 MED ORDER — OXYMETAZOLINE HCL 0.05 % NA SOLN
1.0000 | Freq: Once | NASAL | Status: AC
Start: 2016-06-27 — End: 2016-06-27
  Administered 2016-06-27: 1 via NASAL
  Filled 2016-06-27: qty 15

## 2016-06-27 NOTE — ED Triage Notes (Signed)
Pt states she had a nose bleed last week and had nasal packing placed for 1 week, had packing removed about 3 days ago and has been having intermitterent nose bleeds since last night. Pt also reports a throbbing pain in the center of her chest since last night denies any sob.

## 2016-06-27 NOTE — ED Notes (Signed)
Pt transported to xray 
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
## 2016-07-06 ENCOUNTER — Encounter: Payer: Self-pay | Admitting: Internal Medicine

## 2016-07-06 ENCOUNTER — Ambulatory Visit (INDEPENDENT_AMBULATORY_CARE_PROVIDER_SITE_OTHER): Payer: Medicaid Other | Admitting: Internal Medicine

## 2016-07-06 VITALS — BP 123/81 | HR 71 | Temp 98.1°F | Wt 132.2 lb

## 2016-07-06 DIAGNOSIS — Z794 Long term (current) use of insulin: Secondary | ICD-10-CM

## 2016-07-06 DIAGNOSIS — Z79899 Other long term (current) drug therapy: Secondary | ICD-10-CM | POA: Diagnosis not present

## 2016-07-06 DIAGNOSIS — I10 Essential (primary) hypertension: Secondary | ICD-10-CM | POA: Diagnosis not present

## 2016-07-06 DIAGNOSIS — R040 Epistaxis: Secondary | ICD-10-CM

## 2016-07-06 DIAGNOSIS — F17210 Nicotine dependence, cigarettes, uncomplicated: Secondary | ICD-10-CM

## 2016-07-06 DIAGNOSIS — E119 Type 2 diabetes mellitus without complications: Secondary | ICD-10-CM

## 2016-07-06 LAB — PROTIME-INR
INR: 1.05
PROTHROMBIN TIME: 13.7 s (ref 11.4–15.2)

## 2016-07-06 LAB — POCT GLYCOSYLATED HEMOGLOBIN (HGB A1C): Hemoglobin A1C: 5.6

## 2016-07-06 LAB — APTT: APTT: 32 s (ref 24–36)

## 2016-07-06 LAB — GLUCOSE, CAPILLARY: GLUCOSE-CAPILLARY: 122 mg/dL — AB (ref 65–99)

## 2016-07-06 NOTE — Assessment & Plan Note (Signed)
Lab Results  Component Value Date   HGBA1C 5.6 07/06/2016   HGBA1C 6.0 11/25/2015   HGBA1C 9.4 08/12/2015     Assessment: Diabetes control:  controlled Progress toward A1C goal:  At goal Comments: and Januvia 100 mg andwe are 10 units at night. Her glucometer report ranges from 84-204.  Plan: Medications:  continue current medications Home glucose monitoring: Frequency:   Timing:   Instruction/counseling given: reminded to bring blood glucose meter & log to each visit Educational resources provided:   Self management tools provided:   Other plans: follow-up in 3 months
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
## 2016-07-06 NOTE — Assessment & Plan Note (Signed)
Patient has had recurrent epistaxis and has been seen in the ED for this 3 times.Currently she is not having any activebleeding. She is going to get a humidifier today.  - Checking CBC, INR, APTT

## 2016-07-06 NOTE — Assessment & Plan Note (Signed)
BP Readings from Last 3 Encounters:  07/06/16 123/81  06/27/16 138/82  06/18/16 (!) 183/104    Lab Results  Component Value Date   NA 138 05/16/2016   K 3.9 05/16/2016   CREATININE 0.73 05/16/2016    Assessment: Blood pressure control:  controlled Progress toward BP goal:   at goal Comments: on Norvasc 5 mg and lisinopril 20 mg  Plan: Medications:  continue current medications Educational resources provided:   Self management tools provided:   Other plans: follow up in 3 months

## 2016-07-07 LAB — CBC
HEMATOCRIT: 36.3 % (ref 34.0–46.6)
HEMOGLOBIN: 12.4 g/dL (ref 11.1–15.9)
MCH: 30.4 pg (ref 26.6–33.0)
MCHC: 34.2 g/dL (ref 31.5–35.7)
MCV: 89 fL (ref 79–97)
Platelets: 339 10*3/uL (ref 150–379)
RBC: 4.08 x10E6/uL (ref 3.77–5.28)
RDW: 14.5 % (ref 12.3–15.4)
WBC: 7.6 10*3/uL (ref 3.4–10.8)

## 2016-07-12 NOTE — Progress Notes (Signed)
Internal Medicine Clinic Attending  Case discussed with Dr. Truong at the time of the visit.  We reviewed the resident's history and exam and pertinent patient test results.  I agree with the assessment, diagnosis, and plan of care documented in the resident's note.  

## 2016-07-21 ENCOUNTER — Encounter: Payer: Self-pay | Admitting: Internal Medicine

## 2016-07-21 ENCOUNTER — Ambulatory Visit (INDEPENDENT_AMBULATORY_CARE_PROVIDER_SITE_OTHER): Payer: Medicaid Other | Admitting: Internal Medicine

## 2016-07-21 VITALS — BP 133/82 | HR 64 | Temp 98.2°F | Ht 63.0 in | Wt 131.7 lb

## 2016-07-21 DIAGNOSIS — B9689 Other specified bacterial agents as the cause of diseases classified elsewhere: Secondary | ICD-10-CM | POA: Diagnosis not present

## 2016-07-21 DIAGNOSIS — F17210 Nicotine dependence, cigarettes, uncomplicated: Secondary | ICD-10-CM | POA: Diagnosis not present

## 2016-07-21 DIAGNOSIS — J0100 Acute maxillary sinusitis, unspecified: Secondary | ICD-10-CM

## 2016-07-21 DIAGNOSIS — J018 Other acute sinusitis: Secondary | ICD-10-CM

## 2016-07-21 MED ORDER — FLUTICASONE PROPIONATE 50 MCG/ACT NA SUSP: 1.0000 | Freq: Every day | NASAL | 2 refills | Status: DC

## 2016-07-21 MED ORDER — AMOXICILLIN 500 MG PO CAPS: 500.0000 mg | ORAL_CAPSULE | Freq: Two times a day (BID) | ORAL | 0 refills | Status: AC

## 2016-07-21 MED ORDER — AMOXICILLIN 500 MG PO CAPS: 500.0000 mg | ORAL_CAPSULE | Freq: Two times a day (BID) | ORAL | 0 refills | Status: DC

## 2016-07-21 NOTE — Patient Instructions (Signed)
Start taking amoxicillin twice a day for 7 days. Use Flonase in each nostril once a day. You can take ibuprofen as needed for your body aches.

## 2016-07-21 NOTE — Assessment & Plan Note (Signed)
Assessment: Patient presenting with malaise and productive cough. She is having ear pain and sinus pressure. On exam there are no tonsillar exudates and she has tenderness to palpation of her maxillary and frontal sinuses. We'll treat for bacterial sinusitis.  Plan: Rx for amoxicillin 500 mg twice a day 7 days and Flonase 1 spray into each nostril daily.
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
## 2016-07-25 ENCOUNTER — Other Ambulatory Visit: Payer: Self-pay | Admitting: Internal Medicine

## 2016-07-25 NOTE — Progress Notes (Signed)
Internal Medicine Clinic Attending  Case discussed with Dr. Truong soon after the resident saw the patient.  We reviewed the resident's history and exam and pertinent patient test results.  I agree with the assessment, diagnosis, and plan of care documented in the resident's note. 

## 2016-08-10 ENCOUNTER — Ambulatory Visit (INDEPENDENT_AMBULATORY_CARE_PROVIDER_SITE_OTHER): Payer: Medicaid Other | Admitting: Internal Medicine

## 2016-08-10 ENCOUNTER — Other Ambulatory Visit (HOSPITAL_COMMUNITY)
Admission: RE | Admit: 2016-08-10 | Discharge: 2016-08-10 | Disposition: A | Discharge status: Home / Self Care | Payer: Medicaid Other | Source: Ambulatory Visit | Attending: Student in an Organized Health Care Education/Training Program | Admitting: Student in an Organized Health Care Education/Training Program

## 2016-08-10 ENCOUNTER — Telehealth: Payer: Self-pay

## 2016-08-10 DIAGNOSIS — B9689 Other specified bacterial agents as the cause of diseases classified elsewhere: Secondary | ICD-10-CM | POA: Insufficient documentation

## 2016-08-10 DIAGNOSIS — R102 Pelvic and perineal pain: Secondary | ICD-10-CM | POA: Insufficient documentation

## 2016-08-10 DIAGNOSIS — Z8619 Personal history of other infectious and parasitic diseases: Secondary | ICD-10-CM

## 2016-08-10 DIAGNOSIS — E119 Type 2 diabetes mellitus without complications: Secondary | ICD-10-CM

## 2016-08-10 DIAGNOSIS — N898 Other specified noninflammatory disorders of vagina: Secondary | ICD-10-CM

## 2016-08-10 NOTE — Patient Instructions (Signed)
It was a pleasure to see today Lorraine [name].  I'll call her within the next 24-48 hours with the result of the vaginal swab checked today. I think it is likely that this is a yeast infection but I want to be sure it's not due to a bacteria since the treatment would be different. I'll send a prescription to your. pharmacy along with a phone call at that time.

## 2016-08-10 NOTE — Telephone Encounter (Signed)
Please call pt back regarding yeast infection.
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
## 2016-08-10 NOTE — Telephone Encounter (Signed)
Seen today in clinic.

## 2016-08-11 ENCOUNTER — Telehealth: Payer: Self-pay

## 2016-08-11 NOTE — Telephone Encounter (Signed)
Requesting lab result. Please call back. 

## 2016-08-12 ENCOUNTER — Encounter: Payer: Self-pay | Admitting: Internal Medicine

## 2016-08-12 LAB — CERVICOVAGINAL ANCILLARY ONLY: WET PREP (BD AFFIRM): POSITIVE — AB

## 2016-08-12 MED ORDER — METRONIDAZOLE 500 MG PO TABS: 500.0000 mg | ORAL_TABLET | Freq: Two times a day (BID) | ORAL | 0 refills | Status: DC

## 2016-08-12 NOTE — Progress Notes (Signed)
Internal Medicine Clinic Attending  Case discussed with Dr. Rice soon after the resident saw the patient.  We reviewed the resident's history and exam and pertinent patient test results.  I agree with the assessment, diagnosis, and plan of care documented in the resident's note. 

## 2016-08-12 NOTE — Addendum Note (Signed)
Addended by: Collier Salina on: 08/12/2016 03:30 PM   Modules accepted: Orders

## 2016-08-12 NOTE — Progress Notes (Signed)
Cervicovaginal wet prep result demonstrates Gardnerella so bacterial vaginosis is the cause of her symptoms. I will send a prescription for metronidazole 500 mg twice daily for 7 days to [facility]. I discussed these findings and treatment plan with the patient by phone today around 3:25 PM and she expressed understanding with the plan and expressed the desire to pick up the medicines on Monday. I instructed her to please call the clinic back again if her symptoms are not triggered by a completion of this treatment course.  Collier Salina, MD PGY-II Internal Medicine Resident 08/12/2016, 3:27 PM

## 2016-08-12 NOTE — Assessment & Plan Note (Signed)
Her pain and itching is similar to previous episodes of yeast infections. Her diabetes was well controlled when last checked with Hgb A1c of 5.6% so I do not think this should be causing frequent infections at this time. There is no odor or candidiasis on the external genitalia but a scant thick white discharge is present in the vagina. Vaginal swab was sent for wet prep and I will follow up the result and call Lorraine [name] after identifying if this is a yeast or bacterial process.

## 2016-08-16 ENCOUNTER — Encounter: Payer: Self-pay | Admitting: Internal Medicine

## 2016-08-16 ENCOUNTER — Ambulatory Visit (INDEPENDENT_AMBULATORY_CARE_PROVIDER_SITE_OTHER): Payer: Medicaid Other | Admitting: Internal Medicine

## 2016-08-16 VITALS — BP 152/89 | HR 58 | Temp 97.9°F | Ht 63.0 in | Wt 134.0 lb

## 2016-08-16 DIAGNOSIS — B182 Chronic viral hepatitis C: Secondary | ICD-10-CM | POA: Diagnosis present

## 2016-08-16 NOTE — Progress Notes (Signed)
Rfv: follow up on cirrhosis  Patient ID: Lorraine [name], female   DOB: 1960/10/07, 56 y.o.   MRN: [id number]  HPI Lorraine [name] is a 56yo F with chronic hep C without hepatic coma who finished harvoni on 10/5 now here for follow up at [facility] 12 testing. She is doing well overall  Outpatient Encounter Prescriptions as of 08/16/2016  Medication Sig  . amLODipine (NORVASC) 5 MG tablet Take 1 tablet (5 mg total) by mouth daily.  . Cyanocobalamin (RA VITAMIN B-12 TR) 1000 MCG TBCR Take by mouth.  . diphenhydrAMINE (BENADRYL) 25 mg capsule Take 25 mg by mouth 2 (two) times daily as needed for allergies. Reported on 08/27/2015  . etanercept (ENBREL) 50 MG/ML injection Inject 50 mg into the skin once a week. On Mon.  . fluticasone (FLONASE) 50 MCG/ACT nasal spray Place 1 spray into both nostrils daily.  Marland Kitchen glucose blood (ACCU-CHEK AVIVA PLUS) test strip 1 each by Other route See admin instructions. Use to check blood sugar 3 to 4 times daily. diag code E11.65. Insulin dependent  . glucose blood test strip 1 each by Other route 3 (three) times daily. Use as instructed  . Insulin Glargine (LANTUS) 100 UNIT/ML Solostar Pen Inject 10 Units into the skin at bedtime.  Marland Kitchen lisinopril (PRINIVIL,ZESTRIL) 20 MG tablet TAKE ONE (1) TABLET BY MOUTH EVERY DAY  . metroNIDAZOLE (FLAGYL) 500 MG tablet Take 1 tablet (500 mg total) by mouth 2 (two) times daily.  . metroNIDAZOLE (METROGEL) 0.75 % gel Apply 1 application topically 2 (two) times a week.  . Multiple Vitamins-Minerals (MULTIVITAMIN WITH MINERALS) tablet Take 1 tablet by mouth daily.  . pravastatin (PRAVACHOL) 40 MG tablet Take 1 tablet (40 mg total) by mouth daily.  . pregabalin (LYRICA) 100 MG capsule Take 1 capsule (100 mg total) by mouth 3 (three) times daily.  Marland Kitchen PREMARIN vaginal cream PLACE 1 APPLICATOR FULL VAGINALLY DAILY. USE FOR 21 DAYS AND THEN STOP FOR 7 DAYS AND THEN REPEAT  . sitaGLIPtin (JANUVIA) 100 MG tablet Take 1 tablet (100 mg total) by mouth daily.  .  sodium chloride (OCEAN) 0.65 % SOLN nasal spray Place 1 spray into both nostrils as needed for congestion.  Marland Kitchen UNIFINE PENTIPS 31G X 5 MM MISC INJECT 1 EACH INTO THE SKIN AT BEDTIME  . Vitamin D, Ergocalciferol, (DRISDOL) 50000 units CAPS capsule Take by mouth.  Marland Kitchen omeprazole (PRILOSEC) 20 MG capsule Take 1 capsule (20 mg total) by mouth daily. Take 30 minutes before breakfast. (Patient not taking: Reported on 08/16/2016)   No facility-administered encounter medications on file as of 08/16/2016.      Patient Active Problem List   Diagnosis Date Noted  . Vaginal pain 08/10/2016  . Epistaxis 07/06/2016  . Encounter for immunization 03/31/2016  . Chronic hepatitis C without hepatic coma ([place]) 12/16/2015  . HLD (hyperlipidemia) 09/09/2015  . Fibromyalgia 09/09/2015  . Toe pain, left 04/16/2015  . Onychomycosis of toenail 10/14/2014  . Acute upper respiratory infection 08/21/2014  . Other and unspecified hyperlipidemia 03/31/2014  . Other long term (current) drug therapy 01/07/2014  . Transaminitis 01/02/2013  . 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 Acute sinusitis 05/16/2012  . Preventative health care 04/25/2012  . Rheumatoid arthritis ([place]) 04/03/2012  . GERD (gastroesophageal reflux disease) 02/08/2012  . DM (diabetes mellitus) ([place]) 08/18/2011  . Dysuria 08/18/2011  . Chronic hepatitis C virus infection ([place]) 08/25/2006  . TOBACCO USER 08/25/2006  . HEPATITIS B, HX OF 08/25/2006  . Essential hypertension 05/15/2006  Health Maintenance Due  Topic Date Due  . LIPID PANEL  12/07/2014     Review of Systems Review of Systems  Constitutional: Negative for fever, chills, diaphoresis, activity change, appetite change, fatigue and unexpected weight change.  HENT: Negative for congestion, sore throat, rhinorrhea, sneezing, trouble swallowing and sinus pressure.  Eyes: Negative for photophobia and visual disturbance.  Respiratory: Negative for cough, chest tightness, shortness of breath,  wheezing and stridor.  Cardiovascular: Negative for chest pain, palpitations and leg swelling.  Gastrointestinal: Negative for nausea, vomiting, abdominal pain, diarrhea, constipation, blood in stool, abdominal distention and anal bleeding.  Genitourinary: Negative for dysuria, hematuria, flank pain and difficulty urinating.  Musculoskeletal: Negative for myalgias, back pain, joint swelling, arthralgias and gait problem.  Skin: Negative for color change, pallor, rash and wound.  Neurological: Negative for dizziness, tremors, weakness and light-headedness.  Hematological: Negative for adenopathy. Does not bruise/bleed easily.  Psychiatric/Behavioral: Negative for behavioral problems, confusion, sleep disturbance, dysphoric mood, decreased concentration and agitation.    Physical Exam   BP (!) 152/89   [name] (!) 58   Temp 97.9 F (36.6 C) (Oral)   Ht 5\' 3"  (1.6 m)   Wt 134 lb (60.8 kg)   BMI 23.74 kg/m   Physical Exam  Constitutional:  oriented to person, place, and time. appears well-developed and well-nourished. No distress.  HENT: [place]/AT, PERRLA, no scleral icterus Mouth/Throat: Oropharynx is clear and moist. No oropharyngeal exudate.  Cardiovascular: Normal rate, regular rhythm and normal heart sounds. Exam reveals no gallop and no friction rub.  No murmur heard.  Pulmonary/Chest: Effort normal and breath sounds normal. No respiratory distress.  has no wheezes.  Neck = supple, no nuchal rigidity Abdominal: Soft. Bowel sounds are normal.  exhibits no distension. There is no tenderness.  Lymphadenopathy: no cervical adenopathy. No axillary adenopathy Neurological: alert and oriented to person, place, and time.  Skin: Skin is warm and dry. No rash noted. No erythema.  Psychiatric: a normal mood and affect.  behavior is normal.   Lab Results  Component Value Date   HEPBSAB POS (A) 01/30/2014   No results found for: RPR, LABRPR  CBC Lab Results  Component Value Date   WBC 7.6  07/06/2016   RBC 4.08 07/06/2016   HGB 15.1 (H) 08/16/2015   HCT 36.3 07/06/2016   PLT 339 07/06/2016   MCV 89 07/06/2016   MCH 30.4 07/06/2016   MCHC 34.2 07/06/2016   RDW 14.5 07/06/2016   LYMPHSABS 2.8 01/30/2014   MONOABS 0.5 01/30/2014   EOSABS 0.1 01/30/2014    BMET Lab Results  Component Value Date   NA 138 05/16/2016   K 3.9 05/16/2016   CL 104 05/16/2016   CO2 25 05/16/2016   GLUCOSE 70 05/16/2016   BUN 12 05/16/2016   CREATININE 0.73 05/16/2016   CALCIUM 9.3 05/16/2016   GFRNONAA >89 05/16/2016   GFRAA >89 05/16/2016      Assessment and Plan Chronic hepatitis C, and treated HEP C:  Will need to do continued Startup surveillance via ultrasound Will check HCV viral load imms are up to date  Will arrange for pcp to do further imaging

## 2016-08-18 ENCOUNTER — Ambulatory Visit (HOSPITAL_COMMUNITY): Payer: Medicaid Other

## 2016-08-19 ENCOUNTER — Telehealth: Payer: Self-pay | Admitting: *Deleted

## 2016-08-19 ENCOUNTER — Other Ambulatory Visit: Payer: Self-pay | Admitting: Internal Medicine

## 2016-08-19 DIAGNOSIS — B182 Chronic viral hepatitis C: Secondary | ICD-10-CM

## 2016-08-19 DIAGNOSIS — B1710 Acute hepatitis C without hepatic coma: Secondary | ICD-10-CM

## 2016-08-19 LAB — HEPATITIS C RNA QUANTITATIVE: HCV Quantitative: NOT DETECTED IU/mL (ref <15)

## 2016-08-19 NOTE — Telephone Encounter (Signed)
RN unable to acquire prior authorization for ultrasound before it is scheduled 1/15.  Left message at precert to cancel this appointment.  CPT [id number].  Notified patient. She can do any morning, prefers around 9am at [facility]. Will reschedule once precert is received. Landis Gandy, RN

## 2016-08-22 ENCOUNTER — Ambulatory Visit (HOSPITAL_COMMUNITY): Admission: RE | Admit: 2016-08-22 | Payer: Medicaid Other | Source: Ambulatory Visit

## 2016-08-22 NOTE — Telephone Encounter (Signed)
PA approved, valid 08/1216 - 09/18/16 at [facility].  [id number]. [place] rescheduled for Monday, 08/29/16 8:30 at [facility], arrive 8:15, NPO for 6-8 hours prior to procedure. Patient aware, accepted the appointment. Landis Gandy, RN

## 2016-08-26 ENCOUNTER — Ambulatory Visit: Payer: Medicaid Other

## 2016-08-29 ENCOUNTER — Ambulatory Visit (HOSPITAL_COMMUNITY): Payer: Medicaid Other

## 2016-08-29 ENCOUNTER — Encounter: Payer: Self-pay | Admitting: Internal Medicine

## 2016-08-29 ENCOUNTER — Ambulatory Visit (INDEPENDENT_AMBULATORY_CARE_PROVIDER_SITE_OTHER): Payer: Medicaid Other | Admitting: Internal Medicine

## 2016-08-29 VITALS — BP 135/91 | HR 62 | Temp 98.4°F | Ht 63.0 in | Wt 135.0 lb

## 2016-08-29 DIAGNOSIS — N898 Other specified noninflammatory disorders of vagina: Secondary | ICD-10-CM | POA: Diagnosis present

## 2016-08-29 DIAGNOSIS — R102 Pelvic and perineal pain: Secondary | ICD-10-CM
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
## 2016-08-29 NOTE — Patient Instructions (Signed)
It was a pleasure to see you today Lorraine [name].  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 This process occurs naturally after menopause and leads to some irritation in up to 25% of all women. The treatment is estrogen cream (Premarin). This medicine takes a few weeks to have its full effect because it works by thickening the lining of the vagina.   What is atrophic vaginitis?  Atrophic vaginitis is a condition that causes the vagina and tissues near the vagina to get dry, thin, and inflamed. This can be uncomfortable or make sex painful. Atrophic vaginitis is sometimes called "vaginal atrophy."  Atrophic vaginitis happens when a woman does not make enough of a hormone called estrogen. This condition mainly affects women who have been through menopause (meaning they have stopped having a monthly period). It can also happen to women whose ovaries were removed, who are taking certain medicines, or who are nursing.  What are the symptoms of atrophic vaginitis? -The symptoms include: -Vaginal dryness -Vaginal burning or irritation -Making less lubrication during sex -Pain during sex -Bleeding when something touches or rubs the vagina, for example, after sex. (If you have this symptom, be sure to see a doctor.) -Vaginal discharge (leaking fluid from the vagina) -Urinary problems, such as having to urinate often, having pain with urination, or having blood in the urine. (If you have these symptoms, be sure to see a doctor.) -Some women never tell their doctor they are having symptoms of atrophic vaginitis. Often they are embarrassed or think the symptoms are a normal part of aging. If you have symptoms of this condition, and they bother you, mention it to your doctor or nurse. There are treatments that can help.  Is there a test for atrophic vaginitis? No. There is no test. But your doctor or nurse should be able to tell if you have it by learning about your symptoms and  doing an exam.  Is there anything I can do on my own to feel better? Yes. Some women feel better if they use lubricants before sex and use a vaginal moisturizer, such as Replens or K-Y SILK-E, several times a week. Vaginal moisturizers are not the same as lubricants. They help keep the vagina moist all the time, not just during sex.  Should I see a doctor or nurse? See your doctor or nurse if you have symptoms of atrophic vaginitis and they bother you.  How is atrophic vaginitis treated? The most effective treatment for atrophic vaginitis is the hormone estrogen. When using estrogen to treat atrophic vaginitis, doctors recommend "vaginal estrogen." Vaginal estrogen is any form of estrogen that goes directly into the vagina. It comes in creams, tablets, or a flexible ring. Vaginal estrogen comes in small doses that don't increase the levels of estrogen in other parts of the body very much. Some women who take vaginal estrogen must also take another hormone, called progesterone.

## 2016-08-31 NOTE — Assessment & Plan Note (Addendum)
Her symptoms failed to improve with a full treatment for Gardnerella vaginosis earlier this month. It is not possible to fully exclude a yeast infection but I think this is unlikely for several reasons. Wet preps are relatively although not extremely sensitive with a false negative rate up to about one third for detecting yeast. The physical examination did not reveal a particularly thick or odorous discharge although a small amount was present. Her diabetes has been extremely well controlled over the past year so this should not be leading to a high rate of candidiasis.  Much more likely at this time given the chronicity of her symptoms would be atrophic vaginitis. She has discontinued her use of vaginal estrogen which is the appropriate treatment for this problem. Thin odorless discharge plus mild persistent itching and discomfort would be very characteristic. It is likely her previous swabs demonstrating nothing or Gardnerella could be incidental or on top of this process.  Plan: Instructed her to resume the use of vaginal Premarin 3 weeks on 1 week off and assess for symptom improvement after a month of this

## 2016-09-01 NOTE — Progress Notes (Signed)
Internal Medicine Clinic Attending  Case discussed with Dr. Rice at the time of the visit.  We reviewed the resident's history and exam and pertinent patient test results.  I agree with the assessment, diagnosis, and plan of care documented in the resident's note.  

## 2016-09-07 ENCOUNTER — Ambulatory Visit (HOSPITAL_COMMUNITY)
Admission: RE | Admit: 2016-09-07 | Discharge: 2016-09-07 | Disposition: A | Discharge status: Home / Self Care | Payer: Medicaid Other | Source: Ambulatory Visit | Attending: Internal Medicine | Admitting: Internal Medicine

## 2016-09-07 DIAGNOSIS — B1710 Acute hepatitis C without hepatic coma: Secondary | ICD-10-CM | POA: Diagnosis present

## 2016-09-07 DIAGNOSIS — B182 Chronic viral hepatitis C: Secondary | ICD-10-CM | POA: Diagnosis present

## 2016-09-29 ENCOUNTER — Other Ambulatory Visit: Payer: Self-pay | Admitting: Internal Medicine

## 2016-09-29 ENCOUNTER — Ambulatory Visit (INDEPENDENT_AMBULATORY_CARE_PROVIDER_SITE_OTHER): Payer: Medicaid Other | Admitting: Internal Medicine

## 2016-09-29 ENCOUNTER — Encounter (INDEPENDENT_AMBULATORY_CARE_PROVIDER_SITE_OTHER): Payer: Self-pay

## 2016-09-29 VITALS — BP 140/80 | HR 64 | Temp 98.4°F | Wt 138.6 lb

## 2016-09-29 DIAGNOSIS — L814 Other melanin hyperpigmentation: Secondary | ICD-10-CM | POA: Diagnosis not present

## 2016-09-29 DIAGNOSIS — E785 Hyperlipidemia, unspecified: Secondary | ICD-10-CM

## 2016-09-29 DIAGNOSIS — M79675 Pain in left toe(s): Secondary | ICD-10-CM | POA: Diagnosis present

## 2016-09-29 DIAGNOSIS — L299 Pruritus, unspecified: Secondary | ICD-10-CM | POA: Insufficient documentation

## 2016-09-29 MED ORDER — HYDROXYZINE HCL 10 MG PO TABS: 10.0000 mg | ORAL_TABLET | Freq: Three times a day (TID) | ORAL | 0 refills | Status: DC | PRN

## 2016-09-29 NOTE — Patient Instructions (Addendum)
Take hydroxyzine 10 mg three times a day as needed for itching. We will check blood work today. Can use good moisturizers as well. Follow up in 2 weeks if no improvement.   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 This is not cancer.

## 2016-09-29 NOTE — Assessment & Plan Note (Addendum)
Patient states for the last 4 days she has had generalized pruritus. She mainly itches on her left upper extremity, left lower face, and abdomen. She denies any lesions or itching in between her fingers or toes. She states that she can see small bumps and points at different places on her arms and face. She lives alone and is not exposed to children. Her scalp is also itching. She is sexually active with one female partner who also has recently been itching. She has not tried anything for her symptoms. She denies any new medications recent travel or spending a significant amount of time outdoors. She has never had anything like this before.Warm, dry and intact. No rashes or erythema. no excoriations no raised lesions or papules. No ecchymoses. No evidence of bugs, interdigital lesions, burrows or bite marks. Overall skin exam is benign. Skin is not particularly dry or eczematous.   Assessment: Generalized pruritus without rash. Uncertain etiology.  Plan: -CBC, complete metabolic panel, TSH -Hydroxyzine 10 mg 3 times a day when necessary -Use moisturizing lotions -Follow-up in 2 weeks if no improvement
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
## 2016-09-29 NOTE — Assessment & Plan Note (Signed)
Patient shows me a report from a biopsy she had on her left fourth toe from September 2017. Which resulted as benign acral melanosis which is similar to a freckle and not concerning.

## 2016-09-30 ENCOUNTER — Encounter: Payer: Self-pay | Admitting: Internal Medicine

## 2016-09-30 ENCOUNTER — Telehealth: Payer: Self-pay

## 2016-09-30 LAB — CMP14 + ANION GAP
A/G RATIO: 1.3 (ref 1.2–2.2)
ALK PHOS: 90 IU/L (ref 39–117)
ALT: 11 IU/L (ref 0–32)
ANION GAP: 16 mmol/L (ref 10.0–18.0)
AST: 169 IU/L — AB (ref 0–40)
Albumin: 4.3 g/dL (ref 3.5–5.5)
BUN/Creatinine Ratio: 10 (ref 9–23)
BUN: 9 mg/dL (ref 6–24)
Bilirubin Total: <0.2 mg/dL (ref 0.0–1.2)
CALCIUM: 9.5 mg/dL (ref 8.7–10.2)
CO2: 25 mmol/L (ref 18–29)
Chloride: 101 mmol/L (ref 96–106)
Creatinine, Ser: 0.88 mg/dL (ref 0.57–1.00)
GFR calc Af Amer: 84 mL/min/{1.73_m2} (ref >59)
GFR, EST NON AFRICAN AMERICAN: 73 mL/min/{1.73_m2} (ref >59)
GLUCOSE: 64 mg/dL — AB (ref 65–99)
Globulin, Total: 3.4 g/dL (ref 1.5–4.5)
POTASSIUM: 4.3 mmol/L (ref 3.5–5.2)
Sodium: 142 mmol/L (ref 134–144)
Total Protein: 7.7 g/dL (ref 6.0–8.5)

## 2016-09-30 LAB — CBC
HEMOGLOBIN: 14.2 g/dL (ref 11.1–15.9)
Hematocrit: 41.9 % (ref 34.0–46.6)
MCH: 31.1 pg (ref 26.6–33.0)
MCHC: 33.9 g/dL (ref 31.5–35.7)
MCV: 92 fL (ref 79–97)
PLATELETS: 289 10*3/uL (ref 150–379)
RBC: 4.56 x10E6/uL (ref 3.77–5.28)
RDW: 14 % (ref 12.3–15.4)
WBC: 6.6 10*3/uL (ref 3.4–10.8)

## 2016-09-30 LAB — TSH: TSH: 2.19 u[IU]/mL (ref 0.450–4.500)

## 2016-09-30 NOTE — Telephone Encounter (Signed)
Called patient

## 2016-09-30 NOTE — Telephone Encounter (Signed)
Requesting lab result. Please call back. 

## 2016-10-04 NOTE — Progress Notes (Addendum)
Internal Medicine Clinic Attending  I saw and evaluated the patient.  I personally confirmed the key portions of the history and exam documented by Dr. Burns and I reviewed pertinent patient test results.  The assessment, diagnosis, and plan were formulated together and I agree with the documentation in the resident's note. 

## 2016-10-04 NOTE — Addendum Note (Signed)
Addended by: Lalla Brothers T on: 10/04/2016 09:44 AM   Modules accepted: Level of Service

## 2016-10-07 ENCOUNTER — Telehealth: Payer: Self-pay | Admitting: Internal Medicine

## 2016-10-07 ENCOUNTER — Ambulatory Visit (INDEPENDENT_AMBULATORY_CARE_PROVIDER_SITE_OTHER): Payer: Medicaid Other | Admitting: Internal Medicine

## 2016-10-07 ENCOUNTER — Encounter: Payer: Self-pay | Admitting: Internal Medicine

## 2016-10-07 VITALS — BP 155/81 | HR 67 | Temp 98.1°F | Wt 136.5 lb

## 2016-10-07 DIAGNOSIS — R21 Rash and other nonspecific skin eruption: Secondary | ICD-10-CM | POA: Diagnosis not present

## 2016-10-07 DIAGNOSIS — L299 Pruritus, unspecified: Secondary | ICD-10-CM

## 2016-10-07 DIAGNOSIS — Z23 Encounter for immunization: Secondary | ICD-10-CM | POA: Diagnosis not present

## 2016-10-07 DIAGNOSIS — F17210 Nicotine dependence, cigarettes, uncomplicated: Secondary | ICD-10-CM

## 2016-10-07 DIAGNOSIS — Z79899 Other long term (current) drug therapy: Secondary | ICD-10-CM | POA: Diagnosis not present

## 2016-10-07 DIAGNOSIS — I10 Essential (primary) hypertension: Secondary | ICD-10-CM | POA: Diagnosis not present

## 2016-10-07 MED ORDER — CLOTRIMAZOLE 1 % EX CREA: 1.0000 "application " | TOPICAL_CREAM | Freq: Two times a day (BID) | CUTANEOUS | 0 refills | Status: DC

## 2016-10-07 MED ORDER — AMLODIPINE BESYLATE 10 MG PO TABS: 10.0000 mg | ORAL_TABLET | Freq: Every day | ORAL | 11 refills | Status: DC

## 2016-10-07 NOTE — Telephone Encounter (Signed)
Pharmacy calling about pt's  amLODipine (NORVASC) 10 MG tablet  And her clotrimazole (LOTRIMIN) 1 %
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
## 2016-10-07 NOTE — Assessment & Plan Note (Addendum)
Assessment: Blood pressure elevated today. She is on Norvasc 5 mg, lisinopril 20 mg daily.  Plan: Increase Norvasc to 10 mg daily. Follow-up in 3 weeks for blood pressure check.

## 2016-10-07 NOTE — Patient Instructions (Signed)
Start using clotrimazole cream twice daily to the 2 patches of white spots we saw today.   Continue using a body moisturizer at least twice daily.  Consult with your rheumatologist about enabrel possibly contributing to your itching.   Start taking norvasc 10mg .

## 2016-10-07 NOTE — Telephone Encounter (Signed)
[facility] informed rxs were sent to [place]. Stated she will call to have rxs transferred. I deleted [facility] from preferred pharmacies list stated this had happened before.

## 2016-10-07 NOTE — Assessment & Plan Note (Signed)
Given flu vaccine today  

## 2016-10-07 NOTE — Telephone Encounter (Signed)
They were done today and sent [facility], [facility], they will call and transfer

## 2016-10-07 NOTE — Assessment & Plan Note (Signed)
Assessment: Patient was seen last month for pruritus and was given hydroxyzine and Eucerin cream. Patient states that this has not helped and rash has persisted. Pruritus is present the whole day without any alleviating or exacerbating factors. she denies any new medications, new soaps or foods, and new living arrangements.   Plan: rx for clotrimazole cream applied to white patches. Continue eucerin cream for dry skin. Advised pt to discuss possibility of enabrel contributing to pruritis w/ her rheumatologist whom she will see March 7th. F/u in 3 weeks.

## 2016-10-11 NOTE — Progress Notes (Signed)
Internal Medicine Clinic Attending  Case discussed with Dr. Truong at the time of the visit.  We reviewed the resident's history and exam and pertinent patient test results.  I agree with the assessment, diagnosis, and plan of care documented in the resident's note.  

## 2016-11-09 ENCOUNTER — Other Ambulatory Visit (HOSPITAL_COMMUNITY)
Admission: RE | Admit: 2016-11-09 | Discharge: 2016-11-09 | Disposition: A | Discharge status: Home / Self Care | Payer: Medicaid Other | Source: Ambulatory Visit | Attending: Internal Medicine | Admitting: Internal Medicine

## 2016-11-09 ENCOUNTER — Ambulatory Visit (INDEPENDENT_AMBULATORY_CARE_PROVIDER_SITE_OTHER): Payer: Medicaid Other | Admitting: Internal Medicine

## 2016-11-09 DIAGNOSIS — N898 Other specified noninflammatory disorders of vagina: Secondary | ICD-10-CM

## 2016-11-09 DIAGNOSIS — N949 Unspecified condition associated with female genital organs and menstrual cycle: Secondary | ICD-10-CM | POA: Insufficient documentation

## 2016-11-09 LAB — POCT URINALYSIS DIPSTICK
Bilirubin, UA: NEGATIVE
Glucose, UA: NEGATIVE
Ketones, UA: NEGATIVE
Leukocytes, UA: NEGATIVE
NITRITE UA: NEGATIVE
PH UA: 5.5 (ref 5.0–8.0)
PROTEIN UA: NEGATIVE
RBC UA: NEGATIVE
SPEC GRAV UA: 1.02 (ref 1.030–1.035)
UROBILINOGEN UA: 0.2 (ref ?–2.0)

## 2016-11-09 MED ORDER — ESTRADIOL 0.1 MG/GM VA CREA: TOPICAL_CREAM | VAGINAL | 2 refills | Status: DC

## 2016-11-09 NOTE — Progress Notes (Signed)
Patient ID: Lorraine [name], female   DOB: [birth year], 56 y.o.   MRN: [id number]  Case discussed with Dr. Charlynn Grimes at the time of the visit. We reviewed the resident's history and exam and pertinent patient test results. I agree with the assessment, diagnosis, and plan of care documented in the resident's note.

## 2016-11-09 NOTE — Patient Instructions (Signed)
Lorraine [name],  I do not believe you are having a yeast or other infection. I believe this is related to lower estrogen levels in your body causing vaginal drying. I am going to give you a different medication called Estradiol to use at night. Please schedule follow up with Dr. Aurelio Brash in a month.
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
## 2016-11-09 NOTE — Addendum Note (Signed)
Addended by: Marcelino Duster on: 11/09/2016 09:49 AM   Modules accepted: Orders

## 2016-11-09 NOTE — Assessment & Plan Note (Addendum)
Ms. Fullen says that she gets frequent yeast infections. She was seen back in January with similar complaints. Wet prep at that time showed Gardnerella. She was treated with a full course of metronidazole with no improvement in her symptoms. No evidence of a yeast infection at that time. She was prescribed Premarin cream for possible atrophic vaginitis.   She reports she has been using the Premarin cream without improvement. Reports burning sensation when using the cream. However, her answering is vague and changed several times during the interview and it is unclear if she has actually been using the cream.  Currently sexually active with one female partner. Reports having burning sensation constantly worse with urination. Frequent urination, no urgency, no hematuria, no fevers or chills. Reports scant clear white vaginal discharge. No odors. No recent ABX use. DM at last check was well controlled.   Exam reveals normal appearing vaginal mucosa and cervix. Scant white/clear mucous around the cervix. No malodor present.   Assessment: Vaginal discomfort likely 2/2 atrophic vaginitis  Plan: Urine dipstick today without evidence of infection. Wet prep with GC/Chlamydia done. Last HIV  Follow up wet prep results.   Will treat empirically for atrophic vaginitis with Etradiol. Insert 2 g daily intravaginally for 2 weeks, then gradually reduce to 1/2 the initial dose for 2 weeks, followed by a maintenance dose of 1 g 1 to 3 times per week. Attempt to taper or discontinue at 3- to 52-month intervals.

## 2016-11-10 LAB — CERVICOVAGINAL ANCILLARY ONLY
CHLAMYDIA, DNA PROBE: NEGATIVE
Neisseria Gonorrhea: NEGATIVE
Wet Prep (BD Affirm): NEGATIVE

## 2016-11-16 ENCOUNTER — Other Ambulatory Visit: Payer: Self-pay | Admitting: Internal Medicine

## 2016-11-16 DIAGNOSIS — E1165 Type 2 diabetes mellitus with hyperglycemia: Secondary | ICD-10-CM

## 2016-11-17 NOTE — Telephone Encounter (Signed)
etanercept (ENBREL) 50 MG/ML injection, fluticasone (FLONASE) 50 MCG/ACT nasal spray, Insulin Glargine (LANTUS) 100 UNIT/ML Solostar Pen, refill request.

## 2016-11-18 ENCOUNTER — Encounter (HOSPITAL_COMMUNITY): Payer: Self-pay | Admitting: Emergency Medicine

## 2016-11-18 ENCOUNTER — Ambulatory Visit (HOSPITAL_COMMUNITY)
Admission: EM | Admit: 2016-11-18 | Discharge: 2016-11-18 | Disposition: A | Discharge status: Home / Self Care | Payer: Medicaid Other | Attending: Internal Medicine | Admitting: Internal Medicine

## 2016-11-18 DIAGNOSIS — J040 Acute laryngitis: Secondary | ICD-10-CM | POA: Diagnosis not present

## 2016-11-18 MED ORDER — AZITHROMYCIN 250 MG PO TABS: 250.0000 mg | ORAL_TABLET | Freq: Every day | ORAL | 0 refills | Status: DC
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
## 2016-11-18 NOTE — ED Triage Notes (Signed)
Sore throat and productive cough for 1 week. No fever

## 2016-11-21 ENCOUNTER — Other Ambulatory Visit: Payer: Self-pay | Admitting: *Deleted

## 2016-11-21 DIAGNOSIS — R300 Dysuria: Secondary | ICD-10-CM

## 2016-11-21 DIAGNOSIS — N898 Other specified noninflammatory disorders of vagina: Secondary | ICD-10-CM

## 2016-11-21 MED ORDER — FLUCONAZOLE 150 MG PO TABS: 150.0000 mg | ORAL_TABLET | ORAL | 0 refills | Status: DC

## 2016-11-23 ENCOUNTER — Telehealth: Payer: Self-pay | Admitting: *Deleted

## 2016-11-23 NOTE — Telephone Encounter (Signed)
Call to [place] for Prior Authorization for [place].  Approved 11/23/2016 thru 11/18/2017. [id number] approval code.  [id number] encounter code.  [facility] was called and informed of. Sander Nephew, RN 11/23/2016 10:48 AM

## 2016-11-26 ENCOUNTER — Encounter (HOSPITAL_COMMUNITY): Payer: Self-pay | Admitting: Emergency Medicine

## 2016-11-26 ENCOUNTER — Ambulatory Visit (HOSPITAL_COMMUNITY)
Admission: EM | Admit: 2016-11-26 | Discharge: 2016-11-26 | Disposition: A | Discharge status: Home / Self Care | Payer: Medicaid Other | Attending: Family Medicine | Admitting: Family Medicine

## 2016-11-26 DIAGNOSIS — R05 Cough: Secondary | ICD-10-CM | POA: Diagnosis not present

## 2016-11-26 DIAGNOSIS — J0191 Acute recurrent sinusitis, unspecified: Secondary | ICD-10-CM | POA: Diagnosis not present

## 2016-11-26 DIAGNOSIS — J019 Acute sinusitis, unspecified: Secondary | ICD-10-CM

## 2016-11-26 MED ORDER — AMOXICILLIN-POT CLAVULANATE 875-125 MG PO TABS: 1.0000 | ORAL_TABLET | Freq: Two times a day (BID) | ORAL | 0 refills | Status: AC

## 2016-11-26 MED ORDER — FLUCONAZOLE 150 MG PO TABS: 150.0000 mg | ORAL_TABLET | Freq: Every day | ORAL | 0 refills | Status: DC

## 2016-11-26 NOTE — ED Triage Notes (Signed)
Dull headache and cough, onset 2 weeks ago. Patient says she [facility] and felt better, but symptoms have reoccurred and is feeling worse than before

## 2016-11-26 NOTE — Discharge Instructions (Signed)
I am going to try you today for sinus infection, however I also think you may have seasonal allergy. You would benefit from taking flonase over the counter and may be try zyrtec as well. Please see your primary care doctor if you do no improve.
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
## 2016-12-01 ENCOUNTER — Ambulatory Visit (INDEPENDENT_AMBULATORY_CARE_PROVIDER_SITE_OTHER): Payer: Medicaid Other | Admitting: Internal Medicine

## 2016-12-01 VITALS — BP 103/74 | HR 72 | Temp 98.7°F | Wt 133.4 lb

## 2016-12-01 DIAGNOSIS — R05 Cough: Secondary | ICD-10-CM

## 2016-12-01 DIAGNOSIS — J3489 Other specified disorders of nose and nasal sinuses: Secondary | ICD-10-CM

## 2016-12-01 DIAGNOSIS — F17210 Nicotine dependence, cigarettes, uncomplicated: Secondary | ICD-10-CM | POA: Diagnosis not present

## 2016-12-01 DIAGNOSIS — J0100 Acute maxillary sinusitis, unspecified: Secondary | ICD-10-CM

## 2016-12-01 MED ORDER — CETIRIZINE HCL 10 MG PO CHEW: 10.0000 mg | CHEWABLE_TABLET | Freq: Every day | ORAL | 2 refills | Status: DC
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
## 2016-12-01 NOTE — Patient Instructions (Signed)
General Instructions: - Start Cetirizine 10 mg daily for allergies - Continue to use Flonase 2 sprays each nostril daily   Please bring your medicines with you each time you come to clinic.  Medicines may include prescription medications, over-the-counter medications, herbal remedies, eye drops, vitamins, or other pills.   Progress Toward Treatment Goals:  Treatment Goal 11/25/2015  Hemoglobin A1C improved  Blood pressure -  Stop smoking smoking the same amount    Self Care Goals & Plans:  Self Care Goal 12/10/2015  Manage my medications take my medicines as prescribed; bring my medications to every visit; refill my medications on time; follow the sick day instructions if I am sick  Monitor my health keep track of my blood glucose; bring my glucose meter and log to each visit; keep track of my blood pressure  Eat healthy foods drink diet soda or water instead of juice or soda; eat more vegetables; eat foods that are low in salt; eat baked foods instead of fried foods; eat fruit for snacks and desserts  Be physically active find an activity I enjoy  Stop smoking cut down the number of cigarettes smoked  Meeting treatment goals maintain the current self-care plan    Home Blood Glucose Monitoring 11/25/2015  Check my blood sugar no home glucose monitoring  When to check my blood sugar -     Care Management & Community Referrals:  Referral 10/08/2013  Referrals made for care management support none needed  Referrals made to community resources -

## 2016-12-02 ENCOUNTER — Other Ambulatory Visit: Payer: Self-pay | Admitting: Internal Medicine

## 2016-12-02 NOTE — Assessment & Plan Note (Signed)
Patient's symptoms seem most consistent with acute sinusitis. She completed 2 courses of antibiotics with no relief. Advised her to start sinus irrigation twice daily and Cetirizine 10 mg daily. She was recommended to continue Flonase daily. She will return to clinic if symptoms worsen or persist.

## 2016-12-05 NOTE — Progress Notes (Signed)
Internal Medicine Clinic Attending  Case discussed with Dr. Rivet at the time of the visit.  We reviewed the resident's history and exam and pertinent patient test results.  I agree with the assessment, diagnosis, and plan of care documented in the resident's note.  

## 2016-12-19 ENCOUNTER — Ambulatory Visit (INDEPENDENT_AMBULATORY_CARE_PROVIDER_SITE_OTHER): Payer: Medicaid Other

## 2016-12-19 ENCOUNTER — Encounter (HOSPITAL_COMMUNITY): Payer: Self-pay | Admitting: Emergency Medicine

## 2016-12-19 ENCOUNTER — Ambulatory Visit (HOSPITAL_COMMUNITY)
Admission: EM | Admit: 2016-12-19 | Discharge: 2016-12-19 | Disposition: A | Discharge status: Home / Self Care | Payer: Medicaid Other | Attending: Internal Medicine | Admitting: Internal Medicine

## 2016-12-19 DIAGNOSIS — J40 Bronchitis, not specified as acute or chronic: Secondary | ICD-10-CM

## 2016-12-19 MED ORDER — DOXYCYCLINE HYCLATE 100 MG PO CAPS: 100.0000 mg | ORAL_CAPSULE | Freq: Two times a day (BID) | ORAL | 0 refills | Status: DC

## 2016-12-19 NOTE — ED Triage Notes (Signed)
Cough and head congestion and chest, coughing up phlegm

## 2016-12-19 NOTE — Discharge Instructions (Signed)
See your Physician for recheck in 1 week if symptoms persist  °
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
## 2017-01-01 ENCOUNTER — Encounter (HOSPITAL_COMMUNITY): Payer: Self-pay | Admitting: *Deleted

## 2017-01-01 ENCOUNTER — Ambulatory Visit (HOSPITAL_COMMUNITY)
Admission: EM | Admit: 2017-01-01 | Discharge: 2017-01-01 | Disposition: A | Discharge status: Home / Self Care | Payer: Medicaid Other | Attending: Emergency Medicine | Admitting: Emergency Medicine

## 2017-01-01 DIAGNOSIS — K120 Recurrent oral aphthae: Secondary | ICD-10-CM

## 2017-01-01 MED ORDER — LIDOCAINE VISCOUS 2 % MT SOLN
15.0000 mL | Freq: Once | OROMUCOSAL | Status: AC
  Administered 2017-01-01: 15 mL via OROMUCOSAL

## 2017-01-01 MED ORDER — LIDOCAINE VISCOUS 2 % MT SOLN
OROMUCOSAL | Status: AC
  Filled 2017-01-01: qty 15

## 2017-01-01 NOTE — Discharge Instructions (Signed)
Take a multivitamin daily to help prevent recurring mouth ulcers in addition to good oral hygiene.  PURCHASE OVER THE COUNTER ZILACTIN-B OROMUCOSAL MOUTH RELIEF.  Make sure you dry the sore area completely before you apply with a paper towel.  Then apply as directed.  Should provide relief for 6-8 hours at a time.

## 2017-01-01 NOTE — ED Triage Notes (Signed)
Pt  Reports   Sores    In mouth   For  About  1  Month   She  Reports  She   Decided  To  Get  [employer]  today
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
## 2017-01-17 ENCOUNTER — Encounter: Payer: Self-pay | Admitting: *Deleted

## 2017-02-13 ENCOUNTER — Encounter: Payer: Self-pay | Admitting: Internal Medicine

## 2017-02-13 ENCOUNTER — Other Ambulatory Visit: Payer: Self-pay | Admitting: Internal Medicine

## 2017-02-13 ENCOUNTER — Ambulatory Visit (INDEPENDENT_AMBULATORY_CARE_PROVIDER_SITE_OTHER): Payer: Medicaid Other | Admitting: Internal Medicine

## 2017-02-13 VITALS — BP 118/70 | HR 54 | Temp 97.8°F | Wt 136.0 lb

## 2017-02-13 DIAGNOSIS — E785 Hyperlipidemia, unspecified: Secondary | ICD-10-CM

## 2017-02-13 DIAGNOSIS — Z79899 Other long term (current) drug therapy: Secondary | ICD-10-CM

## 2017-02-13 DIAGNOSIS — F17200 Nicotine dependence, unspecified, uncomplicated: Secondary | ICD-10-CM

## 2017-02-13 DIAGNOSIS — M797 Fibromyalgia: Secondary | ICD-10-CM | POA: Diagnosis not present

## 2017-02-13 DIAGNOSIS — Z794 Long term (current) use of insulin: Secondary | ICD-10-CM | POA: Diagnosis not present

## 2017-02-13 DIAGNOSIS — E119 Type 2 diabetes mellitus without complications: Secondary | ICD-10-CM

## 2017-02-13 DIAGNOSIS — I10 Essential (primary) hypertension: Secondary | ICD-10-CM | POA: Diagnosis not present

## 2017-02-13 DIAGNOSIS — F17210 Nicotine dependence, cigarettes, uncomplicated: Secondary | ICD-10-CM | POA: Diagnosis not present

## 2017-02-13 LAB — POCT GLYCOSYLATED HEMOGLOBIN (HGB A1C): Hemoglobin A1C: 5.9

## 2017-02-13 LAB — GLUCOSE, CAPILLARY: Glucose-Capillary: 72 mg/dL (ref 65–99)

## 2017-02-13 MED ORDER — PRAVASTATIN SODIUM 40 MG PO TABS: 40.0000 mg | ORAL_TABLET | Freq: Every day | ORAL | 0 refills | Status: DC

## 2017-02-13 MED ORDER — PREGABALIN 100 MG PO CAPS: 100.0000 mg | ORAL_CAPSULE | Freq: Three times a day (TID) | ORAL | 1 refills | Status: DC

## 2017-02-13 MED ORDER — GLUCOSE BLOOD VI STRP: 1.0000 | ORAL_STRIP | 6 refills | Status: DC

## 2017-02-13 MED ORDER — CYANOCOBALAMIN ER 1000 MCG PO TBCR: 1000.0000 ug | EXTENDED_RELEASE_TABLET | Freq: Every day | ORAL | 3 refills | Status: DC

## 2017-02-13 NOTE — Patient Instructions (Addendum)
Thank you for coming to see [place] today.   Your blood pressure was at goal today and your Hgb A1C at goal of <6.0. (Actual value on 02/13/2017 = 5.9%). Great Job! Please continue taking your medications for diabetes and hypertension as prescribed. Please call with any questions or medication refill requests.   Follow up with your podiatrist for post-surgical and routine management of your calluses. Please follow up if you need more paperwork completed.   Please return to clinic in 1 month for reassessment of your fibromylagia with your PCP, as you only have 1 month of these medications.

## 2017-02-13 NOTE — Assessment & Plan Note (Signed)
The patient states that her pain has been well controlled on her regimen of Lyrica, 100 mg TID. She states that she is out of this medication and was given a 1 month supply at this visit. She was told to follow up with her PCP in 1 month to ensure that this condition is reevaluated and that this dose of medication is appropriate for long term use.

## 2017-02-13 NOTE — Assessment & Plan Note (Addendum)
The patient states that she has been taking her medication as prescribed, but she recently ran out of medication. Given a current diagnosis of insulin-dependent diabetes and treated HTN, tight control of LDL levels should occur in this patient to reduce risk of further disease. Last LDL was 103 in 2015, slightly elevated above goal of <100. The patient was given a refill for her pravastatin, 40 mg daily and told to follow up with PCP.   Per chart review, the latest LDL in EPIC was from 2015 and care everywhere did not have any recent values. At next visit consider fasting lipid panel to ensure patient is meeting goals of therapy (LDL<100 in diabetic patients).
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
## 2017-02-13 NOTE — Assessment & Plan Note (Signed)
The patient's BP was below goal of 140/90 at today's visit. The patient was congratulated and encouraged to continue her regimen of amlodipine, 10 mg daily and lisinopril, 20 m,g daily.

## 2017-02-13 NOTE — Assessment & Plan Note (Addendum)
The patient's A1C in clinic today was 5.9%. The patient is currently taking Januvia 100 mg, daily with 10 units of lantus over night. She states that this regimen is working for her and denies any symptoms of hypoglycemia currently or in the past.  The patient was educated about the importance of taking her medications to prevent long term complications of diabetes and testing her blood sugar regularly. She was educated about the signs and symptoms of hypoglycemia. If the patient experiences any episodes of low blood sugar during daily monitoring or endorses symptoms of hypoglycemia, consider reducing daily insulin at next visit.   The patient was referred to her podiatrist to assist with proper foot care, given the need for recent callus surgery. Her foot exam was benign today with good circulation and feeling throughout both feet. Please follow up at next appointment to see if she was able to get the shoes she needed.

## 2017-02-13 NOTE — Assessment & Plan Note (Signed)
The patient reported recently decreasing her smoking and is dedicated to quitting after her friends were diagnosed with mouth and stomach cancer. The patient was counseled about the importance of tobacco cessation and encouraged to continue her use of nicotine gum to assist with cravings. She was asked if she felt she needed additional medication at this time and didn't think she did. Please reassess this at next visit and continue to encourage her progress.

## 2017-02-13 NOTE — Telephone Encounter (Signed)
Doren Custard with [facility] needs to speak with a nurse about DEA number. Please call back.

## 2017-02-14 NOTE — Progress Notes (Signed)
Internal Medicine Clinic Attending  I saw and evaluated the patient.  I personally confirmed the key portions of the history and exam documented by Dr. Nedrud and I reviewed pertinent patient test results.  The assessment, diagnosis, and plan were formulated together and I agree with the documentation in the resident's note.  

## 2017-03-03 ENCOUNTER — Encounter: Payer: Self-pay | Admitting: Pharmacist

## 2017-03-03 ENCOUNTER — Ambulatory Visit (INDEPENDENT_AMBULATORY_CARE_PROVIDER_SITE_OTHER): Payer: Medicaid Other | Admitting: Pharmacist

## 2017-03-03 DIAGNOSIS — Z719 Counseling, unspecified: Secondary | ICD-10-CM

## 2017-03-03 DIAGNOSIS — F17210 Nicotine dependence, cigarettes, uncomplicated: Secondary | ICD-10-CM | POA: Diagnosis not present

## 2017-03-03 DIAGNOSIS — Z716 Tobacco abuse counseling: Secondary | ICD-10-CM

## 2017-03-03 DIAGNOSIS — Z720 Tobacco use: Secondary | ICD-10-CM

## 2017-03-03 MED ORDER — NICOTINE POLACRILEX 2 MG MT GUM: CHEWING_GUM | OROMUCOSAL | 0 refills | Status: DC

## 2017-03-03 NOTE — Progress Notes (Signed)
S:  Patient was reviewed by clinical pharmacist for assistance with tobacco cessation.   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 Patient enjoys reading smoking cessation and cancer pamphlets to help her remain smoke free, provided pt with more information. Educated pt on ways to cope with smokers in her car and how to ask them to no smoke while she drives them. Pt was given motivational reminder cards to put in areas where she used to smoke (car dashboard, purse, kitchen).   Reviewed options and patient reports interest in continuing nicotine 4mg  gum, use of nonmedication strategies for oral fixation and educational materials. Continued pt on nicotine 4mg  gum with plans to titrate down to 2mg  gum at next visit due to low number of cigarettes smoked. Treatment was reviewed with the patient, including name, instructions, goals of therapy, potential side effects, importance of adherence, and safe use.  Reviewed potential challenges and coping skills/strategies with patient. Provided information on 1 800-QUIT NOW support program as well as signed patient up for informational materials. Advised patient to  contact me if questions/concerns arise. Patient verbalized understanding of information by repeating back.  Follow up Monday (March 06, 2017) to ensure she is staying cigarette free and to review recent mammogram.   Maryan Char, PharmD Candidate 12:01 PM 03/03/2017

## 2017-03-03 NOTE — Progress Notes (Signed)
Patient was seen in clinic with Casey Wells, PharmD candidate. I agree with the assessment and plan of care documented.  

## 2017-03-03 NOTE — Patient Instructions (Signed)
Patient educated about medication as defined in this encounter and verbalized understanding by repeating back instructions provided.   

## 2017-03-06 ENCOUNTER — Ambulatory Visit (INDEPENDENT_AMBULATORY_CARE_PROVIDER_SITE_OTHER): Payer: Medicaid Other | Admitting: Pharmacist

## 2017-03-06 ENCOUNTER — Encounter: Payer: Self-pay | Admitting: Pharmacist

## 2017-03-06 DIAGNOSIS — F17210 Nicotine dependence, cigarettes, uncomplicated: Secondary | ICD-10-CM | POA: Diagnosis not present

## 2017-03-06 DIAGNOSIS — F17200 Nicotine dependence, unspecified, uncomplicated: Secondary | ICD-10-CM

## 2017-03-06 DIAGNOSIS — Z716 Tobacco abuse counseling: Secondary | ICD-10-CM | POA: Diagnosis not present
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
## 2017-03-06 NOTE — Patient Instructions (Signed)
Patient educated about medication as defined in this encounter and verbalized understanding by repeating back instructions provided.   

## 2017-03-07 NOTE — Progress Notes (Signed)
I reviewed pharmacy documentation and agree with plan.

## 2017-04-13 ENCOUNTER — Other Ambulatory Visit: Payer: Self-pay | Admitting: Student in an Organized Health Care Education/Training Program

## 2017-04-13 DIAGNOSIS — E785 Hyperlipidemia, unspecified: Secondary | ICD-10-CM

## 2017-04-20 ENCOUNTER — Ambulatory Visit (INDEPENDENT_AMBULATORY_CARE_PROVIDER_SITE_OTHER): Payer: Medicaid Other | Admitting: Internal Medicine

## 2017-04-20 ENCOUNTER — Encounter: Payer: Self-pay | Admitting: Internal Medicine

## 2017-04-20 DIAGNOSIS — E119 Type 2 diabetes mellitus without complications: Secondary | ICD-10-CM | POA: Diagnosis present

## 2017-04-20 DIAGNOSIS — F17210 Nicotine dependence, cigarettes, uncomplicated: Secondary | ICD-10-CM

## 2017-04-20 DIAGNOSIS — Z79899 Other long term (current) drug therapy: Secondary | ICD-10-CM | POA: Diagnosis not present

## 2017-04-20 DIAGNOSIS — R7401 Elevation of levels of liver transaminase levels: Secondary | ICD-10-CM

## 2017-04-20 DIAGNOSIS — Z794 Long term (current) use of insulin: Secondary | ICD-10-CM | POA: Diagnosis not present

## 2017-04-20 DIAGNOSIS — K219 Gastro-esophageal reflux disease without esophagitis: Secondary | ICD-10-CM | POA: Diagnosis not present

## 2017-04-20 DIAGNOSIS — I10 Essential (primary) hypertension: Secondary | ICD-10-CM

## 2017-04-20 DIAGNOSIS — M797 Fibromyalgia: Secondary | ICD-10-CM

## 2017-04-20 DIAGNOSIS — F17200 Nicotine dependence, unspecified, uncomplicated: Secondary | ICD-10-CM

## 2017-04-20 DIAGNOSIS — M069 Rheumatoid arthritis, unspecified: Secondary | ICD-10-CM

## 2017-04-20 DIAGNOSIS — M059 Rheumatoid arthritis with rheumatoid factor, unspecified: Secondary | ICD-10-CM

## 2017-04-20 DIAGNOSIS — R740 Nonspecific elevation of levels of transaminase and lactic acid dehydrogenase [LDH]: Secondary | ICD-10-CM | POA: Diagnosis not present

## 2017-04-20 DIAGNOSIS — Z0000 Encounter for general adult medical examination without abnormal findings: Secondary | ICD-10-CM

## 2017-04-20 NOTE — Patient Instructions (Signed)
Ms. Going,   It was a pleasure meeting you today.   We are not changing any of your medications.  Return to clinic in 4 months for some blood work and to schedule your yearly ultrasound of your liver.

## 2017-04-20 NOTE — Assessment & Plan Note (Signed)
Patient received Flu shot at [facility] on 04/06/2017.

## 2017-04-20 NOTE — Assessment & Plan Note (Signed)
Currently well controlled with Omeprazole 20 mg daily.   Plan: -Continue Omeprazole 20 mg

## 2017-04-20 NOTE — Assessment & Plan Note (Signed)
Patient states her fibromyalgia is well controlled on Lyrica 100 mg TID.   Plan: -Continue Lyrica 100 mg TID

## 2017-04-20 NOTE — Assessment & Plan Note (Signed)
AST chronically elevated.  Component     Latest Ref Rng & Units 12/15/2015 03/09/2016 05/16/2016 09/29/2016         5:08 PM     AST     0 - 40 IU/L 200 (H) 130 (H) 149 (H) 169 (H)  Most recent AST 191.  Assessment: Unclear etiology of isolated AST elevation. Elevated transaminases can occur in less than 1% of patients that are on Enbrel. Lyrica can also cause elevation of AST in less than 1% of patients. She had an unremarkable screening abdomal U/S In 08/2016.   Plan: -Continue to Monitor -Repeat CMET at next visit  -Plan to schedule annual U/S for 08/2017

## 2017-04-20 NOTE — Assessment & Plan Note (Signed)
The patient states she is smoking 2-3 cigarettes daily and use to smoke 1/2 PPD. She is motivated to quit and has been using Nicorette gum, which helps suppress her cravings. She is not interested in a nicotine patch at this time.   Plan: Reassess smoking status at next visit and provide any additional support the patient requires.

## 2017-04-20 NOTE — Assessment & Plan Note (Signed)
BP Readings from Last 3 Encounters:  04/20/17 123/75  02/13/17 118/70  01/01/17 (!) 142/80  BP is at goal and well controlled with current regimen of Amlodipine 10 mg and Lisinopril 20 mg. Recent BMET done at [facility] has normal Cr at 0.74, electrolytes normal.   Plan: -Continue Amlodipine 10 mg -Continue Lisinopril 20 mg
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
## 2017-04-20 NOTE — Assessment & Plan Note (Addendum)
The patient follows with [facility]. Currently, well controlled on Enbrel.

## 2017-04-20 NOTE — Assessment & Plan Note (Signed)
Most recent A1C 5.9 (02/2017). The patient is on Januvia 100 mg and Lantus 10 units at night. She denies symptoms of hypoglycemia. She does not check her BG levels at home. She denies any symptoms of peripheral neuropathy. She recently has her diabetic eye exam. Her diabetic foot exam was within normal limits, sensation intact.   Plan: -Continue Januvia 100 mg daily and Lantus 10 units at bedtime -Encouraged the patient to check her blood sugars twice a day before breakfast and dinner.

## 2017-04-25 ENCOUNTER — Ambulatory Visit
Admission: RE | Admit: 2017-04-25 | Discharge: 2017-04-25 | Disposition: A | Discharge status: Home / Self Care | Payer: Medicaid Other | Source: Ambulatory Visit | Attending: Obstetrics & Gynecology | Admitting: Obstetrics & Gynecology

## 2017-04-25 ENCOUNTER — Other Ambulatory Visit: Payer: Self-pay | Admitting: Obstetrics & Gynecology

## 2017-04-25 DIAGNOSIS — Z0000 Encounter for general adult medical examination without abnormal findings: Secondary | ICD-10-CM

## 2017-05-03 NOTE — Progress Notes (Signed)
Internal Medicine Clinic Attending  I saw and evaluated the patient.  I personally confirmed the key portions of the history and exam documented by Dr. LaCroce and I reviewed pertinent patient test results.  The assessment, diagnosis, and plan were formulated together and I agree with the documentation in the resident's note.  

## 2017-06-01 ENCOUNTER — Ambulatory Visit (INDEPENDENT_AMBULATORY_CARE_PROVIDER_SITE_OTHER): Payer: Medicaid Other | Admitting: Internal Medicine

## 2017-06-01 ENCOUNTER — Encounter (INDEPENDENT_AMBULATORY_CARE_PROVIDER_SITE_OTHER): Payer: Self-pay

## 2017-06-01 VITALS — BP 158/88 | HR 82 | Temp 97.8°F | Wt 141.8 lb

## 2017-06-01 DIAGNOSIS — M069 Rheumatoid arthritis, unspecified: Secondary | ICD-10-CM

## 2017-06-01 DIAGNOSIS — Z8742 Personal history of other diseases of the female genital tract: Secondary | ICD-10-CM | POA: Diagnosis not present

## 2017-06-01 DIAGNOSIS — F17210 Nicotine dependence, cigarettes, uncomplicated: Secondary | ICD-10-CM | POA: Diagnosis not present

## 2017-06-01 DIAGNOSIS — J019 Acute sinusitis, unspecified: Secondary | ICD-10-CM | POA: Diagnosis present

## 2017-06-01 DIAGNOSIS — Z79899 Other long term (current) drug therapy: Secondary | ICD-10-CM

## 2017-06-01 DIAGNOSIS — R6883 Chills (without fever): Secondary | ICD-10-CM

## 2017-06-01 DIAGNOSIS — J0100 Acute maxillary sinusitis, unspecified: Secondary | ICD-10-CM

## 2017-06-01 MED ORDER — AMOXICILLIN-POT CLAVULANATE 875-125 MG PO TABS: 1.0000 | ORAL_TABLET | Freq: Two times a day (BID) | ORAL | 0 refills | Status: AC

## 2017-06-01 MED ORDER — FLUCONAZOLE 150 MG PO TABS: 150.0000 mg | ORAL_TABLET | Freq: Every day | ORAL | 0 refills | Status: DC

## 2017-06-01 NOTE — Assessment & Plan Note (Signed)
Patient is here today with complaints of sinusitis 3 weeks. She reports symptoms of sinus congestion, headaches, and subjective fevers and chills. She has failed over-the-counter therapies including Mucinex and Claritin-D. She has been using Flonase daily without relief. Of note, she has a history of RA on TNA alpha inhibitor therapy. Symptoms are likely consistent with bacterial sinusitis given her duration of symptoms and failure of conservative therapy. We discussed treatment with Augmentin. Patient is requesting fluconazole, reports history of vaginal yeast infections after antibiotic therapy in the past. Provided prescription for fluconazole 150 mg x 1, advised to take after completing abx course only if sxs develop.  -- Augmentin BID x 5 days -- Flucozole 150 mg x 1 as needed for vaginal candidiasis  -- F/u as needed
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
## 2017-06-01 NOTE — Patient Instructions (Addendum)
Ms. Kundert,  It was a pleasure to see you today. I am sorry you are not feeling well. For your sinus infection, please take augmentin twice a day for the next 5 days. Please follow up if your symptoms do not improve. If you have any questions or concerns, call our clinic at [phone] or after hours call [phone] and ask for the internal medicine resident on call. Thank you!   - Dr. Philipp Ovens

## 2017-06-02 ENCOUNTER — Ambulatory Visit: Payer: Medicaid Other

## 2017-06-05 ENCOUNTER — Ambulatory Visit
Admission: RE | Admit: 2017-06-05 | Discharge: 2017-06-05 | Disposition: A | Discharge status: Home / Self Care | Payer: Medicaid Other | Source: Ambulatory Visit | Attending: Obstetrics & Gynecology | Admitting: Obstetrics & Gynecology

## 2017-06-12 NOTE — Progress Notes (Signed)
Case discussed with Dr. Guilloud at the time of the visit.  We reviewed the resident's history and exam and pertinent patient test results.  I agree with the assessment, diagnosis and plan of care documented in the resident's note. 

## 2017-06-23 ENCOUNTER — Ambulatory Visit (INDEPENDENT_AMBULATORY_CARE_PROVIDER_SITE_OTHER): Payer: Medicaid Other

## 2017-06-23 ENCOUNTER — Ambulatory Visit (HOSPITAL_COMMUNITY)
Admission: EM | Admit: 2017-06-23 | Discharge: 2017-06-23 | Disposition: A | Discharge status: Home / Self Care | Payer: Medicaid Other | Attending: Internal Medicine | Admitting: Internal Medicine

## 2017-06-23 ENCOUNTER — Encounter (HOSPITAL_COMMUNITY): Payer: Self-pay | Admitting: Family Medicine

## 2017-06-23 DIAGNOSIS — J069 Acute upper respiratory infection, unspecified: Secondary | ICD-10-CM

## 2017-06-23 MED ORDER — ALBUTEROL SULFATE HFA 108 (90 BASE) MCG/ACT IN AERS: 1.0000 | INHALATION_SPRAY | Freq: Four times a day (QID) | RESPIRATORY_TRACT | 0 refills | Status: DC | PRN

## 2017-06-23 NOTE — ED Triage Notes (Signed)
Pt here for URI symptoms x 1 months. Reports hx of PNA.

## 2017-06-23 NOTE — Discharge Instructions (Signed)
Push fluids to ensure adequate hydration and keep secretions thin.  Continue with daily zyrtec and flonase as previously prescribed.  Continue to decrease to quit smoking.  May use inhaler every 6 hours as needed for wheezing or shortness of breath.
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
## 2017-07-05 ENCOUNTER — Other Ambulatory Visit: Payer: Self-pay

## 2017-07-05 NOTE — Telephone Encounter (Signed)
albuterol (PROVENTIL HFA;VENTOLIN HFA) 108 (90 Base) MCG/ACT inhaler, refill request @ [facility] on [address].

## 2017-07-07 ENCOUNTER — Other Ambulatory Visit: Payer: Self-pay | Admitting: *Deleted

## 2017-07-07 MED ORDER — LISINOPRIL 20 MG PO TABS: 20.0000 mg | ORAL_TABLET | Freq: Every day | ORAL | 1 refills | Status: DC

## 2017-07-10 ENCOUNTER — Other Ambulatory Visit: Payer: Self-pay | Admitting: Oncology

## 2017-07-10 DIAGNOSIS — M797 Fibromyalgia: Secondary | ICD-10-CM

## 2017-07-24 ENCOUNTER — Encounter: Payer: Self-pay | Admitting: Internal Medicine

## 2017-07-24 ENCOUNTER — Ambulatory Visit (INDEPENDENT_AMBULATORY_CARE_PROVIDER_SITE_OTHER): Payer: Medicaid Other | Admitting: Internal Medicine

## 2017-07-24 DIAGNOSIS — Z79899 Other long term (current) drug therapy: Secondary | ICD-10-CM

## 2017-07-24 DIAGNOSIS — E119 Type 2 diabetes mellitus without complications: Secondary | ICD-10-CM | POA: Diagnosis not present

## 2017-07-24 DIAGNOSIS — F17210 Nicotine dependence, cigarettes, uncomplicated: Secondary | ICD-10-CM

## 2017-07-24 DIAGNOSIS — M069 Rheumatoid arthritis, unspecified: Secondary | ICD-10-CM

## 2017-07-24 DIAGNOSIS — J329 Chronic sinusitis, unspecified: Secondary | ICD-10-CM | POA: Diagnosis present

## 2017-07-24 DIAGNOSIS — J0100 Acute maxillary sinusitis, unspecified: Secondary | ICD-10-CM

## 2017-07-24 DIAGNOSIS — Z8781 Personal history of (healed) traumatic fracture: Secondary | ICD-10-CM | POA: Diagnosis not present

## 2017-07-24 MED ORDER — AMOXICILLIN-POT CLAVULANATE 875-125 MG PO TABS: 1.0000 | ORAL_TABLET | Freq: Two times a day (BID) | ORAL | 0 refills | Status: AC
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
## 2017-07-24 NOTE — Assessment & Plan Note (Signed)
Assessment: She appears to have a relapse of her sinusitis after initial treatment with Augmentin.  Recurrence of symptoms within two weeks of response to initial oral treatment may represent inadequate eradication of infection.  Given her good response to initial treatment and that her relapse is not worse than prior, she can likely be treated with a longer course of the same antibiotic.  If her relapse were to have been more moderate to severe, a different antibiotic class may be more reasonable.    Plan: - Augmentin BID x 7 days - If symptoms fail to improve, could consider different class of treatment such as Levaquin although imaging and ENT referral may be more appropriate given CT findings from 2007 and likely structural etiology to her symptoms. - Continue Flonase and Zyrtec - Advised to attempt liberal saline nasal irrigation - RTC follow up with PCP or sooner if needed.

## 2017-07-24 NOTE — Patient Instructions (Addendum)
FOLLOW-UP INSTRUCTIONS When: February 20 with Dr Robbi Garter.  If you need to be seen sooner for your sinus infection, please schedule an appointment soon in the [facility] For: Sinus infection, routine health care What to bring:  Your current medications  We are going to treat you for another sinus infection with antibiotics.  It is called Augmentin and you will take it twice per day.  Continue to use your Flonase and Zyrtec as you are doing.  In addition to antibiotic, please start doing saline nasal irrigation.  You can get this over the counter at the pharmacy.  Do this as much as you can to keep your sinuses clear.  Take Care.

## 2017-07-24 NOTE — Progress Notes (Signed)
Case discussed with Dr. Wallace at the time of the visit.  We reviewed the resident's history and exam and pertinent patient test results.  I agree with the assessment, diagnosis, and plan of care documented in the resident's note. 

## 2017-08-23 ENCOUNTER — Encounter: Payer: Medicaid Other | Admitting: Internal Medicine

## 2017-09-13 ENCOUNTER — Ambulatory Visit: Payer: Medicaid Other | Admitting: Internal Medicine

## 2017-09-13 ENCOUNTER — Other Ambulatory Visit: Payer: Self-pay

## 2017-09-13 ENCOUNTER — Encounter: Payer: Self-pay | Admitting: Internal Medicine

## 2017-09-13 VITALS — BP 142/81 | HR 60 | Temp 98.1°F | Ht 63.0 in | Wt 139.9 lb

## 2017-09-13 DIAGNOSIS — I10 Essential (primary) hypertension: Secondary | ICD-10-CM | POA: Diagnosis not present

## 2017-09-13 DIAGNOSIS — Z794 Long term (current) use of insulin: Secondary | ICD-10-CM

## 2017-09-13 DIAGNOSIS — E119 Type 2 diabetes mellitus without complications: Secondary | ICD-10-CM | POA: Diagnosis present

## 2017-09-13 DIAGNOSIS — J0191 Acute recurrent sinusitis, unspecified: Secondary | ICD-10-CM

## 2017-09-13 LAB — POCT GLYCOSYLATED HEMOGLOBIN (HGB A1C): Hemoglobin A1C: 5.8

## 2017-09-13 LAB — GLUCOSE, CAPILLARY: Glucose-Capillary: 93 mg/dL (ref 65–99)

## 2017-09-13 MED ORDER — GUAIFENESIN-DM 100-10 MG/5ML PO SYRP: 5.0000 mL | ORAL_SOLUTION | ORAL | 0 refills | Status: DC | PRN

## 2017-09-13 MED ORDER — LEVOFLOXACIN 500 MG PO TABS: 500.0000 mg | ORAL_TABLET | Freq: Every day | ORAL | 0 refills | Status: AC

## 2017-09-13 NOTE — Assessment & Plan Note (Signed)
A1C 5.8 today. Glucometer BG readings from 06/2017-09/2017 with average BG 109, range 86-152. Patient had stopped taking her 10 units of Lantus 1 month prior due to symptoms of hypoglycemia. She has not had episodes of hypoglycemia since stopping Lantus.   A/P: Well controlled and at goal.  -Will stop Lantus -Will continue Januvia -RTC in 3 months for A1C check off lantus

## 2017-09-13 NOTE — Patient Instructions (Addendum)
Ms. Kindall,   I am sorry you are not feeling well today.   I have prescribed you an antibiotic called Levaquin 500 mg for 7 days. Take one tablet daily. I am also going to refer you to an [place] [facility] specialist because you keep having recurrent infections. Continue to use flonase daily. Please return to clinic if your symptoms worsen or fail to improve.   Please stop taking Lantus and continue taking the Januvia. Follow up in 3 months to check your A1c.   I have made no other changes to your medications.  FOLLOW-UP INSTRUCTIONS When: 3 months For: Blood pressure, diabetes What to bring: medications

## 2017-09-13 NOTE — Assessment & Plan Note (Signed)
BP Readings from Last 3 Encounters:  09/13/17 (!) 142/81  07/24/17 (!) 151/82  06/23/17 123/83   Patient blood pressures have been elevated last 2 clinic visits. I believe this is due to her acute illness (sinusitis) at the time of each visit. Current regimen: amlodipine 10 mg and lisinopril 20 mg.  Plan: Will continue amlodipine 10 mg and lisinopril 20 mg for now. When I saw the patient in 04/2017 her BP was well controlled.  -Will return to clinic in 3 months for follow up, if elevated may need additional BP med such as HCTZ

## 2017-09-13 NOTE — Assessment & Plan Note (Addendum)
Patient complaining of progressively worsening  purulent nasal drainage, productive cough of green sputum, congestion, chills, body aches and headache for one week duration. She has tried OTC Mucinex, which has not alleviated her symptoms. She has had similar episodes in 07/2017 and 05/2017, which she was treated with augmentin. She uses Flonase and Zyrtec daily. HEENT exam with edematous nasal mucosa and purulent nasal discharge, mild TTP of frontal and maxillary sinuses, no cervical adenopathy. Lung exam CTAB.   A/P: Symptoms and HEENT exam consistent with acute, recurrent sinusitis. Patient symptoms are very similar to prior episodes. Patient immunosuppressed on Enbrel. Will treat with Levaquin x 7 days, and refer to ENT as this is a recurrent problem. Advised patient to continue flonase and zyrtec.
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
## 2017-09-14 NOTE — Progress Notes (Signed)
Internal Medicine Clinic Attending  Case discussed with Dr. LaCroce at the time of the visit.  We reviewed the resident's history and exam and pertinent patient test results.  I agree with the assessment, diagnosis, and plan of care documented in the resident's note.  

## 2017-11-03 ENCOUNTER — Ambulatory Visit (HOSPITAL_COMMUNITY)
Admission: EM | Admit: 2017-11-03 | Discharge: 2017-11-03 | Disposition: A | Discharge status: Home / Self Care | Payer: Medicaid Other | Attending: Family Medicine | Admitting: Family Medicine

## 2017-11-03 ENCOUNTER — Ambulatory Visit (INDEPENDENT_AMBULATORY_CARE_PROVIDER_SITE_OTHER): Payer: Medicaid Other

## 2017-11-03 ENCOUNTER — Encounter (HOSPITAL_COMMUNITY): Payer: Self-pay | Admitting: Emergency Medicine

## 2017-11-03 DIAGNOSIS — M7918 Myalgia, other site: Secondary | ICD-10-CM
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
## 2017-11-28 ENCOUNTER — Other Ambulatory Visit: Payer: Self-pay | Admitting: Internal Medicine

## 2017-12-05 ENCOUNTER — Other Ambulatory Visit: Payer: Self-pay | Admitting: Internal Medicine

## 2017-12-05 ENCOUNTER — Telehealth: Payer: Self-pay | Admitting: *Deleted

## 2017-12-05 DIAGNOSIS — M797 Fibromyalgia: Secondary | ICD-10-CM

## 2017-12-05 NOTE — Telephone Encounter (Addendum)
Information was sent to [place] for PA for Januvia.  Confirmation # [id number].  Awaiting determination .  Sander Nephew, RN 12/05/2017.  10:00 AM. PA approved 12/05/2017 thru 11/30/2018.  Sander Nephew, RN 12/07/2017 12:11 PM.

## 2017-12-06 ENCOUNTER — Other Ambulatory Visit: Payer: Self-pay | Admitting: Internal Medicine

## 2018-01-09 ENCOUNTER — Other Ambulatory Visit: Payer: Self-pay

## 2018-01-09 ENCOUNTER — Telehealth: Payer: Self-pay | Admitting: *Deleted

## 2018-01-09 ENCOUNTER — Encounter: Payer: Self-pay | Admitting: Internal Medicine

## 2018-01-09 ENCOUNTER — Ambulatory Visit (INDEPENDENT_AMBULATORY_CARE_PROVIDER_SITE_OTHER): Payer: Medicaid Other | Admitting: Internal Medicine

## 2018-01-09 VITALS — BP 201/99 | HR 56 | Temp 98.4°F | Ht 62.0 in | Wt 135.6 lb

## 2018-01-09 DIAGNOSIS — E1159 Type 2 diabetes mellitus with other circulatory complications: Secondary | ICD-10-CM | POA: Diagnosis not present

## 2018-01-09 DIAGNOSIS — Z79899 Other long term (current) drug therapy: Secondary | ICD-10-CM

## 2018-01-09 DIAGNOSIS — I10 Essential (primary) hypertension: Secondary | ICD-10-CM

## 2018-01-09 DIAGNOSIS — M069 Rheumatoid arthritis, unspecified: Secondary | ICD-10-CM

## 2018-01-09 DIAGNOSIS — M059 Rheumatoid arthritis with rheumatoid factor, unspecified: Secondary | ICD-10-CM

## 2018-01-09 DIAGNOSIS — E119 Type 2 diabetes mellitus without complications: Secondary | ICD-10-CM

## 2018-01-09 DIAGNOSIS — Z7984 Long term (current) use of oral hypoglycemic drugs: Secondary | ICD-10-CM

## 2018-01-09 DIAGNOSIS — J329 Chronic sinusitis, unspecified: Secondary | ICD-10-CM

## 2018-01-09 LAB — COMPREHENSIVE METABOLIC PANEL
ALT: 13 U/L — ABNORMAL LOW (ref 14–54)
AST: 215 U/L — ABNORMAL HIGH (ref 15–41)
Albumin: 3.9 g/dL (ref 3.5–5.0)
Alkaline Phosphatase: 83 U/L (ref 38–126)
Anion gap: 8 (ref 5–15)
BUN: 9 mg/dL (ref 6–20)
CO2: 28 mmol/L (ref 22–32)
Calcium: 9.4 mg/dL (ref 8.9–10.3)
Chloride: 105 mmol/L (ref 101–111)
Creatinine, Ser: 0.69 mg/dL (ref 0.44–1.00)
GFR calc Af Amer: >60 mL/min (ref >60)
GFR calc non Af Amer: >60 mL/min (ref >60)
Glucose, Bld: 113 mg/dL — ABNORMAL HIGH (ref 65–99)
Potassium: 4.5 mmol/L (ref 3.5–5.1)
Sodium: 141 mmol/L (ref 135–145)
Total Bilirubin: 0.4 mg/dL (ref 0.3–1.2)
Total Protein: 7.9 g/dL (ref 6.5–8.1)

## 2018-01-09 LAB — POCT GLYCOSYLATED HEMOGLOBIN (HGB A1C): Hemoglobin A1C: 6 % — AB (ref 4.0–5.6)

## 2018-01-09 LAB — GLUCOSE, CAPILLARY: Glucose-Capillary: 111 mg/dL — ABNORMAL HIGH (ref 65–99)

## 2018-01-09 MED ORDER — CHLORTHALIDONE 25 MG PO TABS: 25.0000 mg | ORAL_TABLET | Freq: Every day | ORAL | 0 refills | Status: DC

## 2018-01-09 MED ORDER — LISINOPRIL 40 MG PO TABS
20.0000 mg | ORAL_TABLET | Freq: Every day | ORAL | 0 refills | Status: DC
Start: 2018-01-09 — End: 2018-01-09

## 2018-01-09 MED ORDER — LISINOPRIL 40 MG PO TABS: 40.0000 mg | ORAL_TABLET | Freq: Every day | ORAL | 0 refills | Status: DC

## 2018-01-09 NOTE — Assessment & Plan Note (Addendum)
Patient has previously been well controlled on Enbrel weekly.  She follows with [facility] rheumatology.  She has not been back for follow-up appointment, her last appointment was in August 2018.  She states she stopped taking Enbrel 3 weeks ago due to reading about adverse side effects online.  She was not having any adverse side effects.  Since then she has been experiencing diffuse joint pains.  Worse in her hands and feet.  She has stiffness that is worse in the morning and will improve with time.  The patient did not understand that the Lyrica she is on will not treat her rheumatoid arthritis, will only help with the pain.  I explained to the patient that the Enbrel is the medication that is treating her rheumatoid arthritis and preventing joint damage.  After explaining this, she is interested in restarting her Enbrel.  Denies fevers, chills, nausea or vomiting.  On exam today she does have increased swelling in her her left thumb IP joint, with decreased range of motion.  No significant warmth or erythema.    Plan: -I plan to call the patient's rheumatologist to discuss restarting Enbrel as the patient still has the prescription at home -Advised patient not to start Enbrel until she receives a call and confirmation from me -We will check CMP and QuantiFERON gold today in anticipation that we will restart this medication -Discussed with the patient that she needs to call and make a follow-up appointment with her rheumatologist, provided her the office number  Addendum:  -Spoke to patient's rheumatologist and he recommends checking hepatitis C RNA and hepatitis B serologies prior to restarting Enbrel -No loading dose of Enbrel required - can restart at 50 mg/ml  -Will place lab orders and call patient to return to clinic for labs or to follow up early next week for lab work

## 2018-01-09 NOTE — Assessment & Plan Note (Addendum)
Patient was evaluated by ENT and she underwent a prolonged, 20 day course of Levaquin, as well as diflucan in 10/2017. If her symptoms recur they will consider CT scan to evaluate further.  She states she completed the course of Levaquin and her symptoms improved temporarily.  She still has intermittent episodes of runny nose, postnasal drip, and mucus production. Today she does not appear to be having an acute recurrence of her sinusitis. HEENT unremarkable.   Plan: -Recommended returning for ENT follow up appointment at [facility] as she may need a CT sinus scan and it will be easier for them to order and view the images at [facility]

## 2018-01-09 NOTE — Patient Instructions (Addendum)
Lorraine [name],   It was a pleasure seeing you today.   Your blood pressure was very high this visit. I have increased your lisinopril to 40 mg daily. I have also added a new medication called Chlorthalidone 25 mg daily. I have sent these to your pharmacy.   If you have a lot of tablets of Lisinopril 20 mg left you can take TWO tablets daily until you run out.   Continue to take Januvia 100 mg daily got your Diabetes. Your diabetes is well controlled!  Please call and make follow appointment with the numbers provided below:  [facility] Rheumatology: Please call [phone] schedule a follow up appointment to discuss your rheumatoid arthritis.   I will call you with your lab results. Do not restart the Enbrel until I call you with your lab results and have discussed you restarting this with your rheumatologist.   Please call [place] to make a follow up appointment for your yearly diuabetic eye exam at [phone].   [facility]: Please call [phone] to make a follow up appointment with the ENT doctor to discuss your sinusitis and possible CT scan    FOLLOW UP IN ONE-TWO WEEKS TO RECHECK YOUR BLOOD PRESSURE!

## 2018-01-09 NOTE — Telephone Encounter (Signed)
Changed rx in med list! Thanks Bonnita Nasuti sorry for the mistake!

## 2018-01-09 NOTE — Assessment & Plan Note (Addendum)
BP Readings from Last 3 Encounters:  01/09/18 (!) 201/99  11/03/17 (!) 139/92  09/13/17 (!) 142/81   Blood pressure poorly controlled on current regimen of Lisinopril 20 mg and Amlodipine 10 mg. Patient has stopped taking her Enbrel and is having diffuse joint pains, but with her previously elevated blood pressures will change blood pressure medication regimen.   Plan: -Increase Lisinopril to 40 mg  -Continue Amlodipine 10 mg -Start Chlorthalidone 25 mg daily  -CMP today  -RTC in 1 week for BP check

## 2018-01-09 NOTE — Assessment & Plan Note (Addendum)
At last visit the patient had stopped taking her lantus. Her A1C at that time was 5.8. Repeat Hemoglobin A1C today 6.0. Currently well controlled on Januvia. Did not bring her meter today, but states her BG levels have been stable in the mid 100s and she is no longer experiencing hypoglycemia.   Plan: -Continue Januvia 100 mg daily  -Patient to schedule follow up appointment with Dr. Katy Fitch for yearly diabetic eye exam
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
## 2018-01-09 NOTE — Addendum Note (Signed)
Addended by: Melanee Spry on: 01/09/2018 03:16 PM   Modules accepted: Orders

## 2018-01-09 NOTE — Telephone Encounter (Signed)
[facility] calls and states that lisinopril script sent today may not reflect what md intended, read note increase lisinopril to 40mg  daily, script reads 40mg  tab take 1/2 daily. Please correct.

## 2018-01-10 NOTE — Addendum Note (Signed)
Addended by: Melanee Spry on: 01/10/2018 07:59 AM   Modules accepted: Orders

## 2018-01-10 NOTE — Progress Notes (Signed)
Internal Medicine Clinic Attending  Case discussed with Dr. LaCroce  at the time of the visit.  We reviewed the resident's history and exam and pertinent patient test results.  I agree with the assessment, diagnosis, and plan of care documented in the resident's note.  Alexander N Raines, MD   

## 2018-01-11 ENCOUNTER — Other Ambulatory Visit (INDEPENDENT_AMBULATORY_CARE_PROVIDER_SITE_OTHER): Payer: Medicaid Other

## 2018-01-11 DIAGNOSIS — M059 Rheumatoid arthritis with rheumatoid factor, unspecified: Secondary | ICD-10-CM | POA: Diagnosis present

## 2018-01-12 LAB — QUANTIFERON-TB GOLD PLUS
QuantiFERON Mitogen Value: >10 IU/mL
QuantiFERON Nil Value: 0.03 IU/mL
QuantiFERON TB1 Ag Value: 1.14 IU/mL
QuantiFERON TB2 Ag Value: 0.97 IU/mL
QuantiFERON-TB Gold Plus: POSITIVE — AB

## 2018-01-12 LAB — HEPATITIS PANEL, ACUTE
Hep A IgM: UNDETERMINED — AB
Hep B C IgM: NEGATIVE
Hep C Virus Ab: >11 s/co ratio — ABNORMAL HIGH (ref 0.0–0.9)
Hepatitis B Surface Ag: NEGATIVE

## 2018-01-13 LAB — HCV RNA QUANT: Hepatitis C Quantitation: NOT DETECTED IU/mL

## 2018-01-16 ENCOUNTER — Telehealth: Payer: Self-pay | Admitting: Internal Medicine

## 2018-01-18 NOTE — Telephone Encounter (Signed)
Discussed lab results with patient.  Patient was diagnosed with latent TB about 4 years ago and completed appropriate treatment course.  QuantiFERON TB Gold was positive which is expected.  Hepatitis C viral RNA was negative.  Patient will follow-up on the 18th in clinic for blood pressure check, and at that time will get a chest x-ray.  Patient asymptomatic and chest x-ray negative for signs of tuberculosis can likely restart Enbrel.  Patient is agreeable to this plan.

## 2018-01-23 ENCOUNTER — Ambulatory Visit (INDEPENDENT_AMBULATORY_CARE_PROVIDER_SITE_OTHER): Payer: Medicaid Other | Admitting: Internal Medicine

## 2018-01-23 ENCOUNTER — Encounter: Payer: Self-pay | Admitting: Internal Medicine

## 2018-01-23 ENCOUNTER — Other Ambulatory Visit: Payer: Self-pay

## 2018-01-23 ENCOUNTER — Ambulatory Visit (HOSPITAL_COMMUNITY)
Admission: RE | Admit: 2018-01-23 | Discharge: 2018-01-23 | Disposition: A | Discharge status: Home / Self Care | Payer: Medicaid Other | Source: Ambulatory Visit | Attending: Internal Medicine | Admitting: Internal Medicine

## 2018-01-23 ENCOUNTER — Telehealth: Payer: Self-pay | Admitting: Dietician

## 2018-01-23 VITALS — BP 116/74 | HR 56 | Temp 98.0°F | Ht 63.0 in | Wt 136.5 lb

## 2018-01-23 DIAGNOSIS — I10 Essential (primary) hypertension: Secondary | ICD-10-CM

## 2018-01-23 DIAGNOSIS — M059 Rheumatoid arthritis with rheumatoid factor, unspecified: Secondary | ICD-10-CM | POA: Diagnosis present

## 2018-01-23 DIAGNOSIS — Z79899 Other long term (current) drug therapy: Secondary | ICD-10-CM | POA: Diagnosis not present

## 2018-01-23 DIAGNOSIS — M069 Rheumatoid arthritis, unspecified: Secondary | ICD-10-CM | POA: Diagnosis not present

## 2018-01-23 NOTE — Assessment & Plan Note (Addendum)
Complaining of knee pain and stiffness today. Labs from recent clinic visit demonstrated: Hep C RNA undetectable, Hep A IgM intermediate +,  AST elevated but this is chronic, quantiferon TB gold was positive, but patient has a history of treated latent TB approximately 4 years ago. She completed the entire treatment course for latent TB at that time. Today she denies symptoms of cough, night sweats, weight loss, or fevers.   Plan:  -Will get CXR today -- No active infiltrate or effusion is seen, slight hyperaeration  -Repeat Hep A Ab total and IgM levels - patient was asymptomatic at time of + hep A IgM levels drawn, possibly false positive or evidence of prior infection, unfortunately there is no way to check hepatitis IgG levels  -Will restart Enbrel 50 mg/ml once weekly if CXR negative  -Patient to follow up with rheumatologist in 03/2018  Addendum 01/25/2018: Hepatitis A antibodies resulted positive again.  Per chart review, the patient has had prior hepatitis A antibodies positive.  Hepatitis a IgM indeterminant.  Clinically, the patient has no symptoms consistent with hepatitis A and I think this is either a false positive or evidence of a prior infection.  Patient's chest x-ray was also negative for active infiltrate.  Have called patient and advised her that it is safe to restart her Enbrel at 50 mg weekly. She is amenable to this and plans to restart her weekly dosing and follow up with her Rheumatologist in August.

## 2018-01-23 NOTE — Progress Notes (Signed)
Internal Medicine Clinic Attending  Case discussed with Dr. LaCroce at the time of the visit.  We reviewed the resident's history and exam and pertinent patient test results.  I agree with the assessment, diagnosis, and plan of care documented in the resident's note.  
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
## 2018-01-23 NOTE — Patient Instructions (Addendum)
Lorraine [name],   I will call you with you chest xray and lab results and let you know when you can restart your Enbrel.   Return to clinic in 3 to 4 months for follow-up of your diabetes, hypertension and rheumatoid arthritis.

## 2018-01-23 NOTE — Telephone Encounter (Signed)
Called Lorraine [name] about her diabetes eye exam being due. She says she has an eye exam scheduled with Dr. Zenia Resides office for March 01, 2018.

## 2018-01-23 NOTE — Assessment & Plan Note (Addendum)
BP Readings from Last 3 Encounters:  01/23/18 116/74  01/09/18 (!) 201/99  11/03/17 (!) 139/92   Blood pressure currently well controlled on lisinopril 40 mg, amlodipine 10 mg, and Chlorthalidone 25 mg daily. Patient denies lightheadedness or dizziness. Tolerating new medications well.   Plan: -Continue lisinopril 40 mg daily  -Continue amlodipine 10 mg daily  -Continue chlorthalidone 25 mg daily  -repeat BMP today

## 2018-01-24 LAB — HEPATITIS A ANTIBODY, IGM: Hep A IgM: UNDETERMINED — AB

## 2018-01-24 LAB — HEPATITIS A ANTIBODY, TOTAL: Hep A Total Ab: POSITIVE — AB

## 2018-01-24 LAB — BMP8+ANION GAP
ANION GAP: 17 mmol/L (ref 10.0–18.0)
BUN/Creatinine Ratio: 17 (ref 9–23)
BUN: 13 mg/dL (ref 6–24)
CO2: 25 mmol/L (ref 20–29)
Calcium: 9.9 mg/dL (ref 8.7–10.2)
Chloride: 96 mmol/L (ref 96–106)
Creatinine, Ser: 0.77 mg/dL (ref 0.57–1.00)
GFR calc Af Amer: 98 mL/min/{1.73_m2} (ref >59)
GFR calc non Af Amer: 85 mL/min/{1.73_m2} (ref >59)
Glucose: 116 mg/dL — ABNORMAL HIGH (ref 65–99)
POTASSIUM: 3.4 mmol/L — AB (ref 3.5–5.2)
SODIUM: 138 mmol/L (ref 134–144)

## 2018-01-29 ENCOUNTER — Telehealth: Payer: Self-pay | Admitting: Internal Medicine

## 2018-01-29 NOTE — Telephone Encounter (Signed)
Patient is requesting refill Lyrica 100mg  and enbrel 50mg  injection, pls send to [facility]

## 2018-01-29 NOTE — Telephone Encounter (Signed)
Lyrica #[id number] with 3 refills sent to [facility] 12/06/2017. Confirmed with Pharmacy they have this Rx, however, both this and enbrel require PA. Will send to PA coordinator. Hubbard Hartshorn, RN, BSN

## 2018-01-30 ENCOUNTER — Telehealth: Payer: Self-pay | Admitting: *Deleted

## 2018-01-30 NOTE — Telephone Encounter (Signed)
Information was sent to [place] for PA for Lyrica.  Approved 01/30/2018 thru 07/29/2018.  Sander Nephew, RN 01/30/2018 2:55 PM.

## 2018-02-01 NOTE — Telephone Encounter (Signed)
Per PA coordinator lyrica has been approved. PA for enbrel will have to be done by prescriber who is outside this office. Hubbard Hartshorn, RN, BSN

## 2018-02-06 ENCOUNTER — Other Ambulatory Visit: Payer: Self-pay | Admitting: Internal Medicine

## 2018-02-06 MED ORDER — ETANERCEPT 50 MG/ML ~~LOC~~ SOSY: 50.0000 mg | PREFILLED_SYRINGE | SUBCUTANEOUS | 0 refills | Status: DC

## 2018-02-06 NOTE — Telephone Encounter (Signed)
Patient  Is requesting refill on Enbrel 50mg , pls send to [facility]

## 2018-02-06 NOTE — Telephone Encounter (Signed)
Reviewed notes. Pt has follow up scheduled with Rheumatology on 03/08/18 at 1pm at [place].  Please make sure she knows this and goes to the appointment as they should take over prescribing when she sees them back.

## 2018-02-09 ENCOUNTER — Telehealth: Payer: Self-pay | Admitting: *Deleted

## 2018-02-09 NOTE — Telephone Encounter (Signed)
Information was faxed to [employer] for PA for Enbrel.  Awaiting decision.  Sander Nephew, RN 02/07/2018 4:00 PM.  Call to [place] was approved 02/07/2018 thru 02/02/2019.  [place] [id number].  Graciella Freer, RN 02/09/2018 3:17 PM.

## 2018-02-26 ENCOUNTER — Encounter: Payer: Self-pay | Admitting: *Deleted

## 2018-03-01 LAB — HM DIABETES EYE EXAM

## 2018-03-09 ENCOUNTER — Other Ambulatory Visit: Payer: Self-pay

## 2018-03-09 ENCOUNTER — Encounter: Payer: Self-pay | Admitting: Internal Medicine

## 2018-03-09 ENCOUNTER — Other Ambulatory Visit (HOSPITAL_COMMUNITY)
Admission: RE | Admit: 2018-03-09 | Discharge: 2018-03-09 | Disposition: A | Discharge status: Home / Self Care | Payer: Medicaid Other | Source: Ambulatory Visit | Attending: Internal Medicine | Admitting: Internal Medicine

## 2018-03-09 ENCOUNTER — Ambulatory Visit: Payer: Medicaid Other | Admitting: Internal Medicine

## 2018-03-09 VITALS — BP 123/72 | HR 56 | Temp 98.6°F | Wt 135.3 lb

## 2018-03-09 DIAGNOSIS — E1169 Type 2 diabetes mellitus with other specified complication: Secondary | ICD-10-CM

## 2018-03-09 DIAGNOSIS — B373 Candidiasis of vulva and vagina: Secondary | ICD-10-CM | POA: Insufficient documentation

## 2018-03-09 DIAGNOSIS — Z8742 Personal history of other diseases of the female genital tract: Secondary | ICD-10-CM

## 2018-03-09 DIAGNOSIS — B3731 Acute candidiasis of vulva and vagina: Secondary | ICD-10-CM

## 2018-03-09 MED ORDER — FLUCONAZOLE 150 MG PO TABS: 150.0000 mg | ORAL_TABLET | ORAL | 0 refills | Status: AC
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
## 2018-03-09 NOTE — Progress Notes (Signed)
Internal Medicine Clinic Attending  Case discussed with Dr. Guilloud at the time of the visit.  We reviewed the resident's history and exam and pertinent patient test results.  I agree with the assessment, diagnosis, and plan of care documented in the resident's note.  

## 2018-03-09 NOTE — Addendum Note (Signed)
Addended by: Orson Gear on: 03/09/2018 03:11 PM   Modules accepted: Orders

## 2018-03-09 NOTE — Patient Instructions (Signed)
Lorraine [name],  It was a pleasure to meet you. I am sorry you haven't been feeling well. I sent a prescription for Diflucan to your pharmacy. Please take one tablet today. If your symptoms do not resolve in the next three days, please take the second pill. Please return to clinic if your symptoms persist despite treatment. If you have any questions or concerns, call our clinic at [phone] or after hours call [phone] and ask for the internal medicine resident on call. Thank you!  - Dr. Philipp Ovens

## 2018-03-09 NOTE — Assessment & Plan Note (Signed)
Patient has a history of frequent vaginal yeast infections as a complication of her diabetes. Over the past few days, she has developed symptoms of white vaginal discharge, itching, and discomfort similar in nature to her prior yeast infection. On pelvic exam, patient has a small amount of white discharge in her vaginal vault. She has not been sexually active for the past year. She denies dysuria or difficulty urinating.  -- Follow up wet prep  -- Empiric Diflucan 150 mg x 1, may repeat in 72 hours if symptoms not resolved  -- Follow up as needed

## 2018-03-12 LAB — CERVICOVAGINAL ANCILLARY ONLY
Bacterial vaginitis: NEGATIVE
CANDIDA VAGINITIS: NEGATIVE
TRICH (WINDOWPATH): NEGATIVE

## 2018-03-16 ENCOUNTER — Telehealth: Payer: Self-pay | Admitting: *Deleted

## 2018-03-16 NOTE — Telephone Encounter (Signed)
-----   Message from Axel Filler, MD sent at 03/15/2018  3:34 PM EDT ----- Please let patient know results of wet prep are normal, no further treatment needed as long as symptoms have resolved.

## 2018-03-16 NOTE — Telephone Encounter (Signed)
Left message on VM requesting return call. L. Ducatte, RN, BSN   

## 2018-03-16 NOTE — Telephone Encounter (Signed)
Call made to patient-no answer, HIPPA compliant message left on recorder.[name]8/9/201910:51 AM

## 2018-03-19 ENCOUNTER — Encounter: Payer: Self-pay | Admitting: *Deleted

## 2018-03-26 ENCOUNTER — Other Ambulatory Visit: Payer: Self-pay | Admitting: Internal Medicine

## 2018-03-26 DIAGNOSIS — I10 Essential (primary) hypertension: Secondary | ICD-10-CM

## 2018-03-27 ENCOUNTER — Ambulatory Visit (INDEPENDENT_AMBULATORY_CARE_PROVIDER_SITE_OTHER): Payer: Medicaid Other | Admitting: Internal Medicine

## 2018-03-27 ENCOUNTER — Telehealth: Payer: Self-pay | Admitting: Internal Medicine

## 2018-03-27 ENCOUNTER — Encounter: Payer: Self-pay | Admitting: Internal Medicine

## 2018-03-27 ENCOUNTER — Other Ambulatory Visit: Payer: Self-pay | Admitting: Internal Medicine

## 2018-03-27 DIAGNOSIS — M069 Rheumatoid arthritis, unspecified: Secondary | ICD-10-CM

## 2018-03-27 DIAGNOSIS — Z0289 Encounter for other administrative examinations: Secondary | ICD-10-CM

## 2018-03-27 DIAGNOSIS — M059 Rheumatoid arthritis with rheumatoid factor, unspecified: Secondary | ICD-10-CM

## 2018-03-27 DIAGNOSIS — I10 Essential (primary) hypertension: Secondary | ICD-10-CM

## 2018-03-27 NOTE — Progress Notes (Signed)
Internal Medicine Clinic Attending  I saw and evaluated the patient.  I personally confirmed the key portions of the history and exam documented by Dr. Eileen Stanford and I reviewed pertinent patient test results.  The assessment, diagnosis, and plan were formulated together and I agree with the documentation in the resident's note. Pt had been given 5 yr placard due to "severe rheum / ortho / etc issue severely limiting ability to walk." Pt is being tx for RA so sxs should improve. Last 3 appts indicated that pt had no functional limitations on screening, inc walking and running errands. Agree with placard for 6 months and reassess.

## 2018-03-27 NOTE — Assessment & Plan Note (Signed)
Rheumatoid arthritis: Lorraine [name] has a long-standing history of rheumatoid arthritis on a Etanercept 50 mg weekly and Lyrica here to have disability parking form signed.  She follows up with [facility] (Dr. Burna Sis ) and recently had a visit on 03/22/2018.  At her visit, she reported of diffuse pain in her hands, shortness and knees.  She was advised to continue her etanercept and Lyrica regimen.  Also an order for x-ray of hands and DEXA scan was ordered given her history of osteopenia in 2017. -Temporary disability parking form signed -Follow-up with Dr. Burna Sis at [facility] on 9/27

## 2018-03-27 NOTE — Telephone Encounter (Signed)
Pt missed call, pt contact# [phone]. Pls return call

## 2018-03-27 NOTE — Patient Instructions (Signed)
Lorraine [name],  It was a pleasure taking care of you at the clinic today.  Please continue to follow-up with the rheumatologist at [facility] out your parking form for you as well  ~Dr. Eileen Stanford
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
## 2018-03-28 NOTE — Telephone Encounter (Signed)
Patient seen yesterday in [facility]. No phone note in chart. Left message on patient's VM requesting return call if needed. Hubbard Hartshorn, RN, BSN

## 2018-04-23 ENCOUNTER — Encounter: Payer: Self-pay | Admitting: Internal Medicine

## 2018-04-23 ENCOUNTER — Ambulatory Visit (INDEPENDENT_AMBULATORY_CARE_PROVIDER_SITE_OTHER): Payer: Medicaid Other | Admitting: Internal Medicine

## 2018-04-23 ENCOUNTER — Other Ambulatory Visit: Payer: Self-pay

## 2018-04-23 VITALS — BP 144/75 | HR 56 | Temp 98.1°F | Ht 63.0 in | Wt 144.0 lb

## 2018-04-23 DIAGNOSIS — M8580 Other specified disorders of bone density and structure, unspecified site: Secondary | ICD-10-CM | POA: Insufficient documentation

## 2018-04-23 DIAGNOSIS — M069 Rheumatoid arthritis, unspecified: Secondary | ICD-10-CM

## 2018-04-23 DIAGNOSIS — M791 Myalgia, unspecified site: Secondary | ICD-10-CM

## 2018-04-23 DIAGNOSIS — M8588 Other specified disorders of bone density and structure, other site: Secondary | ICD-10-CM | POA: Diagnosis not present

## 2018-04-23 DIAGNOSIS — Z79899 Other long term (current) drug therapy: Secondary | ICD-10-CM | POA: Diagnosis not present

## 2018-04-23 DIAGNOSIS — Z720 Tobacco use: Secondary | ICD-10-CM

## 2018-04-23 DIAGNOSIS — E559 Vitamin D deficiency, unspecified: Secondary | ICD-10-CM | POA: Diagnosis not present

## 2018-04-23 DIAGNOSIS — M85852 Other specified disorders of bone density and structure, left thigh: Secondary | ICD-10-CM

## 2018-04-23 DIAGNOSIS — Z9181 History of falling: Secondary | ICD-10-CM | POA: Diagnosis not present

## 2018-04-23 DIAGNOSIS — E1142 Type 2 diabetes mellitus with diabetic polyneuropathy: Secondary | ICD-10-CM | POA: Diagnosis not present

## 2018-04-23 DIAGNOSIS — I10 Essential (primary) hypertension: Secondary | ICD-10-CM | POA: Diagnosis not present

## 2018-04-23 DIAGNOSIS — M7918 Myalgia, other site: Secondary | ICD-10-CM | POA: Diagnosis not present

## 2018-04-23 DIAGNOSIS — M797 Fibromyalgia: Secondary | ICD-10-CM

## 2018-04-23 MED ORDER — CYANOCOBALAMIN ER 1000 MCG PO TBCR: 1000.0000 ug | EXTENDED_RELEASE_TABLET | Freq: Every day | ORAL | 3 refills | Status: DC

## 2018-04-23 NOTE — Assessment & Plan Note (Signed)
Patient with diffuse myalgias in bil LEs for 1 month which is reproducible on exam; seems to be worse with rest and improved with activity. This is more consistent with MSK source rather than arterial or neurological claudication.   Plan: --check CK as Lorraine [name] is on pravastatin - rhabdo less likely with hydrophilic statin, however still at risk --check Bmet for K and Mag; Lorraine [name] may be on chlorthalidone which could lead to hypokalemia and associated myalgias (Lorraine [name] is uncertain if Lorraine [name] is taking this at home, though) --Lorraine [name] has known Vit D deficiency (last checked in Aug 2019 with level of 26); asked that her OTC Ca-VitD supplement has at least 800 IU of vitamin D --check ferritin - less likely to be from iron deficiency anemia as Lorraine [name] had a normal Hgb in August but can have ferritin decrease prior to drop in Hgb
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
## 2018-04-23 NOTE — Assessment & Plan Note (Signed)
Patient unsure of what she is taking; she thinks it is only lisinopril however med list has amlodipine and chlorthalidone as well. She will return in 1 week with all of her medications from home for a med rec. Asked that she continue taking what she has been taking up until now and not restart anything yet until we know what she has at home.

## 2018-04-23 NOTE — Assessment & Plan Note (Signed)
Patient with recent Dexa scan (in [place]) showing osteopenia in Left hip. She also has vit D deficiency and was asked by her rheumatologist to start OTC Ca-VitD supplementation. Per report Frax score is low, however on my measurement risk is >20% which would warrant consideration of treating with bisphosphonates (AA, current smoker, RA).  Plan: --advised that her OTC supplement should have at least 800IU of vit D --will leave further discussion on risks/benefits of starting bisphosphonates in osteopenia in this patient with her PCP.

## 2018-04-23 NOTE — Patient Instructions (Signed)
We'll check some blood work today and I will call you with the results.  Make sure that the Vitamin D supplement you are taking over the counter has 800 to 1000 IU of vit D per serving.

## 2018-04-24 LAB — BMP8+ANION GAP
ANION GAP: 14 mmol/L (ref 10.0–18.0)
BUN/Creatinine Ratio: 14 (ref 9–23)
BUN: 11 mg/dL (ref 6–24)
CALCIUM: 9.6 mg/dL (ref 8.7–10.2)
CHLORIDE: 103 mmol/L (ref 96–106)
CO2: 24 mmol/L (ref 20–29)
Creatinine, Ser: 0.77 mg/dL (ref 0.57–1.00)
GFR calc non Af Amer: 85 mL/min/{1.73_m2} (ref >59)
GFR, EST AFRICAN AMERICAN: 98 mL/min/{1.73_m2} (ref >59)
GLUCOSE: 84 mg/dL (ref 65–99)
Potassium: 4.3 mmol/L (ref 3.5–5.2)
Sodium: 141 mmol/L (ref 134–144)

## 2018-04-24 LAB — FERRITIN: FERRITIN: 106 ng/mL (ref 15–150)

## 2018-04-24 LAB — MAGNESIUM: MAGNESIUM: 1.9 mg/dL (ref 1.6–2.3)

## 2018-04-24 LAB — CK: CK TOTAL: 150 U/L (ref 24–173)

## 2018-04-25 NOTE — Progress Notes (Signed)
Internal Medicine Clinic Attending  Case discussed with Dr. Svalina  at the time of the visit.  We reviewed the resident's history and exam and pertinent patient test results.  I agree with the assessment, diagnosis, and plan of care documented in the resident's note.  

## 2018-04-26 ENCOUNTER — Encounter: Payer: Self-pay | Admitting: Internal Medicine

## 2018-04-26 ENCOUNTER — Telehealth: Payer: Self-pay | Admitting: Internal Medicine

## 2018-04-26 NOTE — Telephone Encounter (Signed)
Attempted calling patient to discuss lab results: electrolytes and iron look ok; muscle enzyme (CK) is normal so she can continue her cholesterol medicine.  Will send letter with results.  Alphonzo Grieve, MD IMTS - PGY3

## 2018-04-30 ENCOUNTER — Ambulatory Visit: Payer: Medicaid Other

## 2018-05-07 ENCOUNTER — Other Ambulatory Visit: Payer: Self-pay | Admitting: Internal Medicine

## 2018-05-07 ENCOUNTER — Encounter: Payer: Self-pay | Admitting: *Deleted

## 2018-05-07 DIAGNOSIS — M797 Fibromyalgia: Secondary | ICD-10-CM

## 2018-05-09 NOTE — Telephone Encounter (Signed)
I approved the Gabapentin. I declined the Enbrel, that should ideally be refilled by her rheumatologist at [facility].

## 2018-05-14 ENCOUNTER — Other Ambulatory Visit: Payer: Self-pay | Admitting: Internal Medicine

## 2018-05-14 NOTE — Telephone Encounter (Signed)
Needs refill on etanercept (ENBREL) 50 MG/ML injection @ [facility] at [address]   ;pt contact [phone]

## 2018-05-14 NOTE — Telephone Encounter (Signed)
Etanercept should be prescribed by her rheumatologist.

## 2018-05-18 ENCOUNTER — Other Ambulatory Visit: Payer: Self-pay | Admitting: Obstetrics & Gynecology

## 2018-05-18 DIAGNOSIS — Z1231 Encounter for screening mammogram for malignant neoplasm of breast: Secondary | ICD-10-CM

## 2018-05-22 ENCOUNTER — Ambulatory Visit: Payer: Medicaid Other | Admitting: Internal Medicine

## 2018-05-22 DIAGNOSIS — I10 Essential (primary) hypertension: Secondary | ICD-10-CM

## 2018-05-22 DIAGNOSIS — Z79899 Other long term (current) drug therapy: Secondary | ICD-10-CM

## 2018-05-22 DIAGNOSIS — M7918 Myalgia, other site: Secondary | ICD-10-CM | POA: Diagnosis not present

## 2018-05-22 DIAGNOSIS — M791 Myalgia, unspecified site: Secondary | ICD-10-CM

## 2018-05-22 MED ORDER — AMLODIPINE BESYLATE 10 MG PO TABS: 10.0000 mg | ORAL_TABLET | Freq: Every day | ORAL | 3 refills | Status: DC

## 2018-05-22 MED ORDER — CHLORTHALIDONE 25 MG PO TABS: 25.0000 mg | ORAL_TABLET | Freq: Every day | ORAL | 1 refills | Status: DC

## 2018-05-22 NOTE — Patient Instructions (Addendum)
FOLLOW-UP INSTRUCTIONS When: about 4-6 weeks  For: to see your primary care doctor, Dr. Sharon Seller What to bring: All of your medications  I have refilled your Amlodipine and Chlorthalidone today. Please continue to take these. Also, please stop taking and discharge the hydroxychloroquine.   With regard to your muscle aches, we have extensively worked this up on your last visit and thus far the results of our evaluation have been reassuring. I recommend that you follow-up with Dr. Sharon Seller at her next available appointment in about 4-6 weeks.  Today we discussed your high blood pressure and what medications you should be taking.   Thank you for your visit to the [facility] today. If you have any questions or concerns please call us at [phone].
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
## 2018-05-23 NOTE — Assessment & Plan Note (Signed)
Myalgias: Proximal thighs. Recently evaluated with current work-up unremarkable. I do not feel as if her Statin is responsible given the extremely low rate of true positive statin induced myopathies. Also, patients CK, K+, Mag WNL's.   Plan: Improved minimally  Will monitor Consider TSH, ESR, CRP, (has RA so likely of low diagnostic yield) ANA if no improvement at next visit. May consider cessation of her statin, but this is unlikely to be the source.

## 2018-05-23 NOTE — Assessment & Plan Note (Signed)
HTN: Patient presents today for assistance with understanding an appropriate medication regimen. She stated that she has been taking most of her prescribed medications but is not sure which one she should be on and when she should be taking them. Today it appears that she has not been taking what is recommended at regularly prescribed intervals. I have discussed her antihypertensive regimen which was to include Amlodipine 10mg  (which she has run short of), lisinopril 40mg  (which she is taking but forgot to bring with her), and chlorthalidone 25mg  daily which she has and takes intermittently.   Plan: Resume Amlodipine 10mg  daily Continue Lisinopril 40mg  daily Continue chlorthalidone 25mg  daily Obtain BMP at f/up visit in  4-6 weeks, last BMP in 09/16 w/ Scr WNL's

## 2018-05-30 NOTE — Progress Notes (Signed)
Internal Medicine Clinic Attending  Case discussed with Dr. Harbrecht at the time of the visit.  We reviewed the resident's history and exam and pertinent patient test results.  I agree with the assessment, diagnosis, and plan of care documented in the resident's note.  Alexander Raines, M.D., Ph.D.  

## 2018-06-01 ENCOUNTER — Other Ambulatory Visit: Payer: Self-pay | Admitting: Internal Medicine

## 2018-06-01 DIAGNOSIS — E785 Hyperlipidemia, unspecified: Secondary | ICD-10-CM

## 2018-06-13 ENCOUNTER — Other Ambulatory Visit: Payer: Self-pay

## 2018-06-13 NOTE — Telephone Encounter (Signed)
etanercept (ENBREL) 50 MG/ML injection, refill request @ [facility]. Pt also requesting med for yeast infection. Please call pt back.

## 2018-06-14 ENCOUNTER — Other Ambulatory Visit: Payer: Self-pay | Admitting: *Deleted

## 2018-06-14 DIAGNOSIS — B373 Candidiasis of vulva and vagina: Secondary | ICD-10-CM

## 2018-06-14 DIAGNOSIS — B3731 Acute candidiasis of vulva and vagina: Secondary | ICD-10-CM

## 2018-06-14 MED ORDER — FLUCONAZOLE 150 MG PO TABS: 150.0000 mg | ORAL_TABLET | ORAL | 0 refills | Status: DC

## 2018-06-15 NOTE — Telephone Encounter (Signed)
Called pt - informed Diflucan rx was sent to the pharmacy per Dr Sharon Seller. Also stated she did a refill on Enbrel. Stated she will decide if she needs to keep he rappt on Monday; if not, she will call.

## 2018-06-15 NOTE — Telephone Encounter (Signed)
Ok thank you. I did refill the diflucan for her but she would need to get her rheumatologist to refill the etanercept.

## 2018-06-15 NOTE — Telephone Encounter (Signed)
Pt stated she can come Monday am @ 0845 in [facility].

## 2018-06-18 ENCOUNTER — Encounter: Payer: Self-pay | Admitting: Internal Medicine

## 2018-06-18 ENCOUNTER — Ambulatory Visit: Payer: Medicaid Other

## 2018-06-22 ENCOUNTER — Ambulatory Visit: Payer: Medicaid Other

## 2018-06-27 ENCOUNTER — Encounter: Payer: Self-pay | Admitting: Internal Medicine

## 2018-06-27 ENCOUNTER — Ambulatory Visit (INDEPENDENT_AMBULATORY_CARE_PROVIDER_SITE_OTHER): Payer: Medicaid Other | Admitting: Internal Medicine

## 2018-06-27 ENCOUNTER — Ambulatory Visit
Admission: RE | Admit: 2018-06-27 | Discharge: 2018-06-27 | Disposition: A | Discharge status: Home / Self Care | Payer: Medicaid Other | Source: Ambulatory Visit | Attending: Obstetrics & Gynecology | Admitting: Obstetrics & Gynecology

## 2018-06-27 ENCOUNTER — Other Ambulatory Visit (HOSPITAL_COMMUNITY)
Admission: RE | Admit: 2018-06-27 | Discharge: 2018-06-27 | Disposition: A | Discharge status: Home / Self Care | Payer: Medicaid Other | Source: Ambulatory Visit | Attending: Internal Medicine | Admitting: Internal Medicine

## 2018-06-27 ENCOUNTER — Other Ambulatory Visit: Payer: Self-pay

## 2018-06-27 VITALS — BP 127/76 | HR 65 | Temp 98.4°F | Ht 63.0 in | Wt 140.8 lb

## 2018-06-27 DIAGNOSIS — M791 Myalgia, unspecified site: Secondary | ICD-10-CM

## 2018-06-27 DIAGNOSIS — N949 Unspecified condition associated with female genital organs and menstrual cycle: Secondary | ICD-10-CM

## 2018-06-27 DIAGNOSIS — Z79899 Other long term (current) drug therapy: Secondary | ICD-10-CM | POA: Diagnosis not present

## 2018-06-27 DIAGNOSIS — Z1231 Encounter for screening mammogram for malignant neoplasm of breast: Secondary | ICD-10-CM

## 2018-06-27 DIAGNOSIS — M069 Rheumatoid arthritis, unspecified: Secondary | ICD-10-CM | POA: Diagnosis not present

## 2018-06-27 DIAGNOSIS — Z8739 Personal history of other diseases of the musculoskeletal system and connective tissue: Secondary | ICD-10-CM

## 2018-06-27 DIAGNOSIS — N898 Other specified noninflammatory disorders of vagina: Secondary | ICD-10-CM

## 2018-06-27 DIAGNOSIS — Z90710 Acquired absence of both cervix and uterus: Secondary | ICD-10-CM

## 2018-06-27 DIAGNOSIS — E119 Type 2 diabetes mellitus without complications: Secondary | ICD-10-CM

## 2018-06-27 DIAGNOSIS — I10 Essential (primary) hypertension: Secondary | ICD-10-CM | POA: Diagnosis present

## 2018-06-27 DIAGNOSIS — Z8619 Personal history of other infectious and parasitic diseases: Secondary | ICD-10-CM | POA: Diagnosis not present

## 2018-06-27 NOTE — Patient Instructions (Signed)
It was a pleasure meeting you today, Ms. Hartwell.  Vaginal discomfort - I will call you when we get the results from your test back. - If it is positive for yeast, I will prescribe you diflucan. - If it is negative for yeast, I will prescribe you Premarin cream again   High blood pressure - Your blood pressure is much better controlled today. - Please continue taking your medications. - We will get blood work today to check your electrolytes and kidney function  Feel free to call our clinic at [phone] if you have any questions.  Thanks, Dr. Annie Paras

## 2018-06-27 NOTE — Progress Notes (Signed)
Internal Medicine Clinic Attending  I saw and evaluated the patient.  I personally confirmed the key portions of the history and exam documented by Dr. Dorrell and I reviewed pertinent patient test results.  The assessment, diagnosis, and plan were formulated together and I agree with the documentation in the resident's note. 
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
## 2018-06-27 NOTE — Assessment & Plan Note (Signed)
Her myalgias have resolved. She attributes this to eating more dairy and starting a calcium supplement.

## 2018-06-27 NOTE — Assessment & Plan Note (Signed)
BP 127/76 today. Patient well-controlled on current regimen. No changes today.  Plan 1. Continue amlodipine 10mg , lisinopril 40mg , and chlorthalidone 25mg  daily 2. BMP today

## 2018-06-27 NOTE — Assessment & Plan Note (Addendum)
HPI: Ms. Vo reports a week of vaginal irritation, itching, dysuria, and malodorous light brown discharge. She denies fevers, chills, abdominal pain, vaginal bleeding, urinary frequency, or urinary urgency. She has a history of vaginal irritation. She has been treated for BV in the past with flagyl. She has also been treated for atrophic vaginitis with premarin cream and estradiol although she is not currently on either of these. She is postmenopausal s/p hysterectomy in 2006. Most recently she was treated for vaginal candidiasis. Her wet prep was negative, but her symptoms did resolve with diflucan.   Assessment: There is a moderate amount of clear discharge present on exam. No notable loss of vaginal rugae or vaginal paleness to suggest atrophic vaginitis. Wet prep collected. If the wet prep is positive for yeast, will treat with diflucan. If the wet prep is negative for yeast, will resume Premarin for atrophic vaginitis. I advised the patient to avoid vigorously washing the area with soap as she has been doing. Her DM is currently well-controlled, last A1c was 6 five months ago. She is not on an SGLT2 inhibitor to explain the frequent infections, if they are truly infections.  Plan 1. UA 2. Wet prep 3. Diflucan if wet prep positive for yeast; Premarin if wet prep negative for yeast 4. F/u if symptoms do not improve after treatment  ADDENDUM: UA and wet prep negative. Start Premarin.

## 2018-06-28 LAB — MICROSCOPIC EXAMINATION
BACTERIA UA: NONE SEEN
Casts: NONE SEEN /lpf
EPITHELIAL CELLS (NON RENAL): NONE SEEN /HPF (ref 0–10)

## 2018-06-28 LAB — URINALYSIS, COMPLETE
BILIRUBIN UA: NEGATIVE
GLUCOSE, UA: NEGATIVE
KETONES UA: NEGATIVE
Leukocytes, UA: NEGATIVE
NITRITE UA: NEGATIVE
PROTEIN UA: NEGATIVE
RBC UA: NEGATIVE
SPEC GRAV UA: 1.028 (ref 1.005–1.030)
UUROB: 0.2 mg/dL (ref 0.2–1.0)
pH, UA: 6 (ref 5.0–7.5)

## 2018-06-28 LAB — BMP8+ANION GAP
ANION GAP: 17 mmol/L (ref 10.0–18.0)
BUN/Creatinine Ratio: 10 (ref 9–23)
BUN: 8 mg/dL (ref 6–24)
CALCIUM: 9.9 mg/dL (ref 8.7–10.2)
CO2: 28 mmol/L (ref 20–29)
Chloride: 92 mmol/L — ABNORMAL LOW (ref 96–106)
Creatinine, Ser: 0.77 mg/dL (ref 0.57–1.00)
GFR calc Af Amer: 98 mL/min/{1.73_m2} (ref >59)
GFR, EST NON AFRICAN AMERICAN: 85 mL/min/{1.73_m2} (ref >59)
Glucose: 92 mg/dL (ref 65–99)
POTASSIUM: 3.3 mmol/L — AB (ref 3.5–5.2)
Sodium: 137 mmol/L (ref 134–144)

## 2018-06-28 LAB — CERVICOVAGINAL ANCILLARY ONLY
BACTERIAL VAGINITIS: NEGATIVE
Candida vaginitis: NEGATIVE
Chlamydia: NEGATIVE
Neisseria Gonorrhea: NEGATIVE
TRICH (WINDOWPATH): NEGATIVE

## 2018-06-28 LAB — SPECIMEN STATUS REPORT

## 2018-06-29 ENCOUNTER — Telehealth: Payer: Self-pay | Admitting: Internal Medicine

## 2018-06-29 MED ORDER — ESTROGENS, CONJUGATED 0.625 MG/GM VA CREA: 1.0000 | TOPICAL_CREAM | Freq: Every day | VAGINAL | 12 refills | Status: DC

## 2018-06-29 NOTE — Telephone Encounter (Signed)
[name]' wet prep was negative. Therefore, she does not need diflucan for yeast infection. I have prescribed premarin cream for atrophic vaginitis as we discussed at her recent visit. She has used this medication before. I left a voicemail asking her to call back so this information can be relayed to her.

## 2018-06-29 NOTE — Addendum Note (Signed)
Addended by: Corinne Ports on: 06/29/2018 11:57 AM   Modules accepted: Orders

## 2018-07-26 ENCOUNTER — Other Ambulatory Visit: Payer: Self-pay | Admitting: Internal Medicine

## 2018-07-26 DIAGNOSIS — M797 Fibromyalgia: Secondary | ICD-10-CM

## 2018-08-09 ENCOUNTER — Other Ambulatory Visit: Payer: Self-pay | Admitting: Internal Medicine

## 2018-08-11 ENCOUNTER — Other Ambulatory Visit: Payer: Self-pay | Admitting: Internal Medicine

## 2018-08-14 ENCOUNTER — Other Ambulatory Visit: Payer: Self-pay

## 2018-08-14 NOTE — Telephone Encounter (Signed)
Lorraine [name] with [facility] requesting a refill on JANUVIA 100 MG tablet.

## 2018-08-14 NOTE — Telephone Encounter (Signed)
Januvia was refilled on 08/10/18 by Dr Sharon Seller. [facility] stated they did not received rx; verbal order given "Januvia 100 mg - take one tab by mouth every day qty#90 x 2 RF".

## 2018-08-29 ENCOUNTER — Encounter: Payer: Self-pay | Admitting: Internal Medicine

## 2018-08-29 ENCOUNTER — Ambulatory Visit: Payer: Medicaid Other | Admitting: Internal Medicine

## 2018-08-29 ENCOUNTER — Telehealth: Payer: Self-pay | Admitting: *Deleted

## 2018-08-29 VITALS — BP 137/78 | HR 60 | Temp 97.6°F | Ht 63.0 in | Wt 147.7 lb

## 2018-08-29 DIAGNOSIS — B9689 Other specified bacterial agents as the cause of diseases classified elsewhere: Secondary | ICD-10-CM | POA: Diagnosis not present

## 2018-08-29 DIAGNOSIS — J019 Acute sinusitis, unspecified: Secondary | ICD-10-CM

## 2018-08-29 MED ORDER — AMOXICILLIN-POT CLAVULANATE 875-125 MG PO TABS: 1.0000 | ORAL_TABLET | Freq: Two times a day (BID) | ORAL | 0 refills | Status: DC

## 2018-08-29 MED ORDER — AMOXICILLIN-POT CLAVULANATE 875-125 MG PO TABS: 1.0000 | ORAL_TABLET | Freq: Two times a day (BID) | ORAL | 0 refills | Status: AC

## 2018-08-29 NOTE — Assessment & Plan Note (Signed)
Sinus congestion: Patient here today for evaluation of a three week history of acute on chronic sinus pressure, sneezing, coughing on occaision, headache, sinus pain, purulent sinus discharge, and sore throat. She denied fever, chills or myalgias. This is much worse than her chronic sinusitis for which she visited ENT for in the past. She will need to call her ENT physician and follow-up. Likely acute bacterial sinusitis.  Plan: Augmentin 875/125 BID for five days given chronicity and symptoms Return to ENT as previously advised Utilize a neti pot, directions given and explained Given strict return precautions

## 2018-08-29 NOTE — Addendum Note (Signed)
Addended by: Nicola Girt on: 08/29/2018 12:03 PM   Modules accepted: Orders

## 2018-08-29 NOTE — Telephone Encounter (Signed)
That is correct. There is a dose/days button that was auto selected indicating 14 days of therapy. I skipped over that as those are usually not auto populated and only wrote for 10 tablets for a total of 5 days of treatment for uncomplicated cystitis. That is correct.   Thank you Georgia Lopes

## 2018-08-29 NOTE — Patient Instructions (Addendum)
FOLLOW-UP INSTRUCTIONS When: If your symptoms worsen or fail to improve Also, please stop at the front desk and schedule to see Dr. Sharon Seller as her next available appointment.  What to bring: All of your medications   I have added Augmentin to your medications today. Please take this twice a day for five days. Please also purchase a Neti Pot or other device to help you clean out your nasal passages.   As always if your symptoms worsen, fail to improve, or you develop other concerning symptoms, please notify our office or visit the local ER if we are unavailable. Symptoms including fever, chills, muscle aches, or visual changes, should not be ignored and should encourage you to visit the ED if we are unavailable by phone or the symptoms are severe.  Thank you for your visit to the [facility] today. If you have any questions or concerns please call us at [phone].    How to Perform a Sinus Rinse A sinus rinse is a home treatment. It rinses your sinuses with a mixture of salt and water (saline solution). Sinuses are air-filled spaces in your skull behind the bones of your face and forehead. They open into your nasal cavity. A sinus rinse can help to clear your nasal cavity. It can clear mucus, dirt, dust, or pollen. You may do a sinus rinse when you have:  A cold.  A virus.  Allergies.  A sinus infection.  A stuffy nose. Talk with your doctor about whether a sinus rinse might help you. What are the risks? A sinus rinse is normally very safe and helpful. However, there are a few risks. These include:  A burning feeling in the sinuses. This may happen if you do not make the saline solution as instructed. Be sure to follow all directions when making the saline solution.  Nasal irritation.  Infection from unclean water. This is rare, but possible. Do not do a sinus rinse if you have had:  Ear or nasal surgery.  An ear infection.  Blocked ears. Supplies needed:  Saline  solution or powder.  Distilled or germ-free (sterile) water may be needed to mix with saline powder. ? You may use boiled and cooled tap water. Boil tap water for 5 minutes; cool until it is lukewarm. Use within 24 hours. ? Do not use regular tap water to mix with the saline solution.  Neti pot or nasal rinse bottle. This releases the saline solution into your nose and through your sinuses. You can buy neti pots and rinse bottles: ? At your local pharmacy. ? At a health food store. ? Online. How to perform a sinus rinse  1. Wash your hands with soap and water. 2. Wash your device using the directions that came with it. 3. Dry your device. 4. Use the solution that comes with your device or one that is sold separately in stores. Follow the mixing directions on the package if you need to mix with sterile or distilled water. 5. Fill your device with the amount of saline solution stated in the device instructions. 6. Stand over a sink and tilt your head sideways over the sink. 7. Place the spout of the device in your upper nostril (the one closer to the ceiling). 8. Gently pour or squeeze the saline solution into your nasal cavity. The liquid should drain to your lower nostril if you are not too stuffed up (congested). 9. While rinsing, breathe through your open mouth. 10. Gently blow your nose  to clear any mucus and rinse solution. Blowing too hard may cause ear pain. 11. Repeat in your other nostril. 12. Clean and rinse your device with clean water. 13. Air-dry your device. Talk with your doctor or pharmacist if you have questions about how to do a sinus rinse. Summary  A sinus rinse is a home treatment. It rinses your sinuses with a mixture of salt and water (saline solution).  A sinus rinse is normally very safe and helpful. Follow all instructions carefully.  Talk with your doctor about whether a sinus rinse might help you. This information is not intended to replace advice given to  you by your health care provider. Make sure you discuss any questions you have with your health care provider. Document Released: 02/19/2014 Document Revised: 05/22/2017 Document Reviewed: 05/22/2017 Elsevier Interactive Patient Education  2019 [employer].

## 2018-08-29 NOTE — Telephone Encounter (Signed)
Please review script in medlist of aumentin, pharm called, reviewed note and gave VO for augmentin 875/125mg  1 tablet twice daily for 5 days. Was VO correct? If so please change medlist thanks
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
## 2018-08-30 NOTE — Progress Notes (Signed)
Internal Medicine Clinic Attending  Case discussed with Dr. Harbrecht at the time of the visit.  We reviewed the resident's history and exam and pertinent patient test results.  I agree with the assessment, diagnosis, and plan of care documented in the resident's note.   

## 2018-09-06 ENCOUNTER — Telehealth: Payer: Self-pay | Admitting: *Deleted

## 2018-09-06 NOTE — Telephone Encounter (Signed)
Information was sent through [employer] for PA for Pregabalin 100 mg Capsules.  Approved  conf # [id number] W.  09/06/2018 thru 09/06/2019.  [facility] was called and notified of the approval.  Sander Nephew, RN 09/06/2018 11:58 AM.

## 2018-09-11 ENCOUNTER — Encounter: Payer: Self-pay | Admitting: Internal Medicine

## 2018-09-11 ENCOUNTER — Ambulatory Visit: Payer: Medicaid Other

## 2018-10-10 ENCOUNTER — Encounter: Payer: Self-pay | Admitting: Internal Medicine

## 2018-10-10 ENCOUNTER — Ambulatory Visit: Payer: Medicaid Other | Admitting: Internal Medicine

## 2018-10-10 VITALS — BP 122/72 | HR 66 | Temp 97.9°F | Ht 63.0 in | Wt 147.1 lb

## 2018-10-10 DIAGNOSIS — K047 Periapical abscess without sinus: Secondary | ICD-10-CM

## 2018-10-10 DIAGNOSIS — B373 Candidiasis of vulva and vagina: Secondary | ICD-10-CM

## 2018-10-10 DIAGNOSIS — E785 Hyperlipidemia, unspecified: Secondary | ICD-10-CM | POA: Diagnosis not present

## 2018-10-10 DIAGNOSIS — I10 Essential (primary) hypertension: Secondary | ICD-10-CM | POA: Diagnosis not present

## 2018-10-10 DIAGNOSIS — J329 Chronic sinusitis, unspecified: Secondary | ICD-10-CM | POA: Diagnosis not present

## 2018-10-10 DIAGNOSIS — E1159 Type 2 diabetes mellitus with other circulatory complications: Secondary | ICD-10-CM | POA: Diagnosis not present

## 2018-10-10 DIAGNOSIS — B3731 Acute candidiasis of vulva and vagina: Secondary | ICD-10-CM

## 2018-10-10 LAB — POCT GLYCOSYLATED HEMOGLOBIN (HGB A1C): Hemoglobin A1C: 5.6 % (ref 4.0–5.6)

## 2018-10-10 LAB — GLUCOSE, CAPILLARY: Glucose-Capillary: 136 mg/dL — ABNORMAL HIGH (ref 70–99)

## 2018-10-10 MED ORDER — PRAVASTATIN SODIUM 40 MG PO TABS: ORAL_TABLET | ORAL | 3 refills | Status: DC

## 2018-10-10 MED ORDER — CHLORTHALIDONE 25 MG PO TABS: 25.0000 mg | ORAL_TABLET | Freq: Every day | ORAL | 1 refills | Status: DC

## 2018-10-10 MED ORDER — FLUCONAZOLE 150 MG PO TABS: 150.0000 mg | ORAL_TABLET | ORAL | 0 refills | Status: DC

## 2018-10-10 NOTE — Patient Instructions (Addendum)
Thank you for allowing [place] to provide your care today. Today we discussed your hypertension, sinus congestion, and yeast infection.   I have ordered the following labs for you:  Basic metabolic panel   I will call if any are abnormal.    Today we made the following changes to your medications:   Please START taking  Diflucan (fluconazole) 150 MG tablet - take one tablet now and one in three days   Please STOP taking  Januvia 100 mg as your hemoglobin A1c is well controlled and you do not currently need this medication. Please continue to eat a healthy diet.   Please follow-up in three months to check your hemoglobin A1C .     Should you have any questions or concerns please call the internal medicine clinic at [phone].
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
## 2018-10-11 ENCOUNTER — Other Ambulatory Visit: Payer: Self-pay

## 2018-10-11 LAB — BMP8+ANION GAP
Anion Gap: 16 mmol/L (ref 10.0–18.0)
BUN/Creatinine Ratio: 12 (ref 9–23)
BUN: 9 mg/dL (ref 6–24)
CO2: 22 mmol/L (ref 20–29)
Calcium: 9.4 mg/dL (ref 8.7–10.2)
Chloride: 104 mmol/L (ref 96–106)
Creatinine, Ser: 0.75 mg/dL (ref 0.57–1.00)
GFR calc Af Amer: 101 mL/min/{1.73_m2} (ref >59)
GFR calc non Af Amer: 88 mL/min/{1.73_m2} (ref >59)
Glucose: 107 mg/dL — ABNORMAL HIGH (ref 65–99)
Potassium: 3.4 mmol/L — ABNORMAL LOW (ref 3.5–5.2)
Sodium: 142 mmol/L (ref 134–144)

## 2018-10-11 LAB — LIPID PANEL
Chol/HDL Ratio: 3 ratio (ref 0.0–4.4)
Cholesterol, Total: 189 mg/dL (ref 100–199)
HDL: 62 mg/dL (ref >39)
LDL Calculated: 86 mg/dL (ref 0–99)
Triglycerides: 203 mg/dL — ABNORMAL HIGH (ref 0–149)
VLDL Cholesterol Cal: 41 mg/dL — ABNORMAL HIGH (ref 5–40)

## 2018-10-13 ENCOUNTER — Encounter: Payer: Self-pay | Admitting: Internal Medicine

## 2018-10-13 DIAGNOSIS — K047 Periapical abscess without sinus: Secondary | ICD-10-CM | POA: Insufficient documentation

## 2018-10-13 NOTE — Assessment & Plan Note (Addendum)
Recently treated with Augmentin for recurrent sinus infection, which she has a history of. Still having some sinus congestion and taking flonase. States initial sinus infection resolved and does not have sinus pain. Continues to smoke. Discussed flonase is not meant for more than three days at a time due to rebound rhinitis. Also discussed smoking cessation, and that this will help resolve her symptoms sooner. She understands and states she will work on this as she has nicotine gum at home. She still has not followed up with ENT.   - reassess smoking at f/u  - needs to scheduled appointment with ENT

## 2018-10-13 NOTE — Assessment & Plan Note (Signed)
Hemoglobin a1c 5.6. Diabetes well controlled. Currently taking moderate intensity statin.   - stop Januvia - f/u Hgb a1c 3 months - lipid panel, increase to high intensity statin if necessary

## 2018-10-13 NOTE — Assessment & Plan Note (Signed)
BP Readings from Last 3 Encounters:  10/10/18 122/72  08/29/18 137/78  06/27/18 127/76   She takes norvasc 10 mg qd, hygroton 25 mg qd, and lisinopril 40 mg qd. Blood pressure well controlled  - refill Hygroton  - BMP.

## 2018-10-13 NOTE — Assessment & Plan Note (Signed)
Taking PCN per dentist. Will follow-up with them for removal of tooth.

## 2018-10-13 NOTE — Assessment & Plan Note (Signed)
Susceptible to yeast infections and states she has one currently has thick white discharge with some mild irritation and requesting diflucan.   - diflucan 150 mg one now and another in 72 hours  - f/u if symptoms do not resolve in the next few days

## 2018-10-15 NOTE — Progress Notes (Signed)
Internal Medicine Clinic Attending  Case discussed with Dr. Seawell at the time of the visit.  We reviewed the resident's history and exam and pertinent patient test results.  I agree with the assessment, diagnosis, and plan of care documented in the resident's note.    

## 2018-10-22 ENCOUNTER — Other Ambulatory Visit: Payer: Self-pay

## 2018-10-22 NOTE — Telephone Encounter (Signed)
etanercept (ENBREL) 50 MG/ML injection, REFILL REQUEST @  [facility] at [facility], [address] [phone] (Phone) [phone] (Fax)

## 2018-10-24 ENCOUNTER — Other Ambulatory Visit: Payer: Self-pay | Admitting: Internal Medicine

## 2018-10-24 NOTE — Telephone Encounter (Signed)
Needs refill on etanercept (ENBREL) 50 MG/ML injection ; pt contact [phone] [facility] at [facility], [place]

## 2018-10-24 NOTE — Telephone Encounter (Addendum)
Call made to patient regarding etanercept-no answer

## 2018-10-25 ENCOUNTER — Other Ambulatory Visit: Payer: Self-pay | Admitting: Internal Medicine

## 2018-10-25 DIAGNOSIS — I10 Essential (primary) hypertension: Secondary | ICD-10-CM

## 2018-10-25 NOTE — Telephone Encounter (Signed)
Attempted to contact pt regarding refill request for etanercept. Request was previously denied by pcp on 10/22/18-pt should be requesting refills from her rheumatologist.  CMA left a HIPPA compliant message on pt's recorder to call back regarding a refill request.Lorraine Bunda Cassady3/19/202010:47 AM

## 2018-10-26 ENCOUNTER — Telehealth: Payer: Self-pay | Admitting: *Deleted

## 2018-10-26 NOTE — Telephone Encounter (Signed)
-----   Message from Marty Heck, DO sent at 10/22/2018  5:15 PM EDT ----- Regarding: Medication Refill Hello,  I somehow closed the chart with refill request without sending a message as I meant to, sorry. She came to the office recently and we discussed that she needed to follow-up with her rheumatologist for refill as she has not seen him ten months and missed a f/u appointment with him last September. If he will not refill the medication we can could do so in the interim but she really needs to see him as I think she did not complete the f/u imaging he ordered as well.   Thanks  [employer]

## 2018-10-26 NOTE — Telephone Encounter (Signed)
Called pt - no answer; left message to call the office . 

## 2018-11-07 MED FILL — IBUPROFEN 800 MG TAB: 800 | 10 days supply | Qty: 30 | Fill #0

## 2018-11-07 MED FILL — PENICILLIN VK 500 MG TABLET: 500 | 10 days supply | Qty: 40 | Fill #0

## 2018-11-07 MED FILL — FLUCONAZOLE 150 MG TABS: 150 | 1 days supply | Qty: 1 | Fill #0

## 2018-11-13 ENCOUNTER — Other Ambulatory Visit: Payer: Self-pay | Admitting: Student in an Organized Health Care Education/Training Program

## 2018-11-13 DIAGNOSIS — M797 Fibromyalgia: Secondary | ICD-10-CM

## 2018-11-13 MED FILL — PREGABALIN 100 MG CAPS: 100 | 30 days supply | Qty: 90 | Fill #0

## 2018-11-13 MED FILL — ENBREL 50 MG/ML SOSY: 50 | 28 days supply | Qty: 4 | Fill #0

## 2018-11-13 MED FILL — JANUVIA 100 MG TABLET: 100 | 30 days supply | Qty: 30 | Fill #0

## 2018-12-14 MED FILL — ENBREL 50 MG/ML SOSY: 50 | 28 days supply | Qty: 4 | Fill #0

## 2019-01-04 ENCOUNTER — Encounter (HOSPITAL_COMMUNITY): Payer: Self-pay | Admitting: Emergency Medicine

## 2019-01-04 ENCOUNTER — Emergency Department (HOSPITAL_COMMUNITY)
Admission: EM | Admit: 2019-01-04 | Discharge: 2019-01-05 | Disposition: A | Discharge status: Home / Self Care | Payer: Medicaid Other | Attending: Emergency Medicine | Admitting: Emergency Medicine

## 2019-01-04 DIAGNOSIS — Z79899 Other long term (current) drug therapy: Secondary | ICD-10-CM | POA: Insufficient documentation

## 2019-01-04 DIAGNOSIS — B182 Chronic viral hepatitis C: Secondary | ICD-10-CM | POA: Insufficient documentation

## 2019-01-04 DIAGNOSIS — F17210 Nicotine dependence, cigarettes, uncomplicated: Secondary | ICD-10-CM | POA: Diagnosis not present

## 2019-01-04 DIAGNOSIS — I10 Essential (primary) hypertension: Secondary | ICD-10-CM | POA: Diagnosis not present

## 2019-01-04 DIAGNOSIS — E1165 Type 2 diabetes mellitus with hyperglycemia: Secondary | ICD-10-CM | POA: Diagnosis not present

## 2019-01-04 DIAGNOSIS — M069 Rheumatoid arthritis, unspecified: Secondary | ICD-10-CM | POA: Insufficient documentation

## 2019-01-04 DIAGNOSIS — R739 Hyperglycemia, unspecified: Secondary | ICD-10-CM

## 2019-01-04 DIAGNOSIS — E876 Hypokalemia: Secondary | ICD-10-CM

## 2019-01-04 LAB — BASIC METABOLIC PANEL
Anion gap: 15 (ref 5–15)
BUN: 7 mg/dL (ref 6–20)
CO2: 27 mmol/L (ref 22–32)
Calcium: 9.9 mg/dL (ref 8.9–10.3)
Chloride: 88 mmol/L — ABNORMAL LOW (ref 98–111)
Creatinine, Ser: 0.8 mg/dL (ref 0.44–1.00)
GFR calc Af Amer: >60 mL/min (ref >60)
GFR calc non Af Amer: >60 mL/min (ref >60)
Glucose, Bld: 508 mg/dL (ref 70–99)
Potassium: 2.5 mmol/L — CL (ref 3.5–5.1)
Sodium: 130 mmol/L — ABNORMAL LOW (ref 135–145)

## 2019-01-04 LAB — URINALYSIS, ROUTINE W REFLEX MICROSCOPIC
Bacteria, UA: NONE SEEN
Bilirubin Urine: NEGATIVE
Glucose, UA: >=500 mg/dL — AB
Hgb urine dipstick: NEGATIVE
Ketones, ur: NEGATIVE mg/dL
Leukocytes,Ua: NEGATIVE
Nitrite: NEGATIVE
Protein, ur: 30 mg/dL — AB
Specific Gravity, Urine: 1.035 — ABNORMAL HIGH (ref 1.005–1.030)
pH: 6 (ref 5.0–8.0)

## 2019-01-04 LAB — CBC
HCT: 41.2 % (ref 36.0–46.0)
Hemoglobin: 14.8 g/dL (ref 12.0–15.0)
MCH: 30.5 pg (ref 26.0–34.0)
MCHC: 35.9 g/dL (ref 30.0–36.0)
MCV: 84.8 fL (ref 80.0–100.0)
Platelets: 350 10*3/uL (ref 150–400)
RBC: 4.86 MIL/uL (ref 3.87–5.11)
RDW: 11.9 % (ref 11.5–15.5)
WBC: 7.2 10*3/uL (ref 4.0–10.5)
nRBC: 0 % (ref 0.0–0.2)

## 2019-01-04 LAB — CBG MONITORING, ED
Glucose-Capillary: 550 mg/dL (ref 70–99)
Glucose-Capillary: 457 mg/dL — ABNORMAL HIGH (ref 70–99)
Glucose-Capillary: 354 mg/dL — ABNORMAL HIGH (ref 70–99)

## 2019-01-04 LAB — MAGNESIUM: Magnesium: 1.9 mg/dL (ref 1.7–2.4)

## 2019-01-04 MED ORDER — POTASSIUM CHLORIDE ER 10 MEQ PO TBCR: 20.0000 meq | EXTENDED_RELEASE_TABLET | Freq: Two times a day (BID) | ORAL | 0 refills | Status: DC

## 2019-01-04 MED ORDER — INSULIN ASPART 100 UNIT/ML ~~LOC~~ SOLN
12.0000 [IU] | Freq: Once | SUBCUTANEOUS | Status: AC
  Administered 2019-01-04: 22:00:00 12 [IU] via SUBCUTANEOUS

## 2019-01-04 MED ORDER — POTASSIUM CHLORIDE ER 10 MEQ PO TBCR: 20.0000 meq | EXTENDED_RELEASE_TABLET | Freq: Every day | ORAL | 0 refills | Status: DC

## 2019-01-04 MED ORDER — POTASSIUM CHLORIDE 10 MEQ/100ML IV SOLN
10.0000 meq | INTRAVENOUS | Status: AC
  Administered 2019-01-04 (×2): 10 meq via INTRAVENOUS
  Filled 2019-01-04 (×2): qty 100

## 2019-01-04 MED ORDER — SODIUM CHLORIDE 0.9 % IV BOLUS
500.0000 mL | Freq: Once | INTRAVENOUS | Status: AC
  Administered 2019-01-04: 500 mL via INTRAVENOUS

## 2019-01-04 MED ORDER — SITAGLIPTIN PHOSPHATE 100 MG PO TABS: ORAL_TABLET | ORAL | 0 refills | Status: DC

## 2019-01-04 MED ORDER — POTASSIUM CHLORIDE CRYS ER 20 MEQ PO TBCR
40.0000 meq | EXTENDED_RELEASE_TABLET | Freq: Once | ORAL | Status: AC
  Administered 2019-01-04: 40 meq via ORAL
  Filled 2019-01-04: qty 2

## 2019-01-04 MED ORDER — SODIUM CHLORIDE 0.9 % IV BOLUS
1000.0000 mL | Freq: Once | INTRAVENOUS | Status: AC
  Administered 2019-01-04: 22:00:00 1000 mL via INTRAVENOUS

## 2019-01-04 NOTE — Discharge Instructions (Addendum)
I want you to call the [facility] on Monday to schedule a follow up appointment with your PCP regarding your blood sugars and to recheck your potassium. I also want you to restart taking Januvia 100 mg daily. I have provided you with a new prescription.   I also want you to take potassium 20 meq twice a day for 3 days and then continue taking potassium 20 meq once a day for a week. Do not take your potassium 40 meq while you are taking the prescriptions I have provided.
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
## 2019-01-04 NOTE — ED Triage Notes (Signed)
Pt states he has had CBG of high and then 500. Took medicine for diabetes Lorraine [name]) and MD told her to stop. She states she has prob not following a good diet. She did take a Chad today. She has been thirsty and feeling lethargic

## 2019-01-05 MED FILL — POTASSIUM CHL ER M10 TABLET: 10 | 3 days supply | Qty: 12 | Fill #0

## 2019-01-05 NOTE — ED Notes (Signed)
Discharge instructions and follow up care discussed with pt. Pt verbalized care. No questions at this time. Pt to go home with family.

## 2019-01-07 ENCOUNTER — Other Ambulatory Visit: Payer: Self-pay

## 2019-01-07 ENCOUNTER — Ambulatory Visit: Payer: Medicaid Other | Admitting: Internal Medicine

## 2019-01-07 VITALS — BP 139/108 | HR 84 | Temp 98.6°F | Ht 63.0 in | Wt 139.2 lb

## 2019-01-07 DIAGNOSIS — I10 Essential (primary) hypertension: Secondary | ICD-10-CM

## 2019-01-07 DIAGNOSIS — E119 Type 2 diabetes mellitus without complications: Secondary | ICD-10-CM

## 2019-01-07 DIAGNOSIS — M797 Fibromyalgia: Secondary | ICD-10-CM | POA: Diagnosis not present

## 2019-01-07 DIAGNOSIS — Z79899 Other long term (current) drug therapy: Secondary | ICD-10-CM | POA: Diagnosis not present

## 2019-01-07 DIAGNOSIS — Z794 Long term (current) use of insulin: Secondary | ICD-10-CM | POA: Diagnosis not present

## 2019-01-07 DIAGNOSIS — E876 Hypokalemia: Secondary | ICD-10-CM | POA: Diagnosis not present

## 2019-01-07 LAB — POCT GLYCOSYLATED HEMOGLOBIN (HGB A1C): Hemoglobin A1C: 9.2 % — AB (ref 4.0–5.6)

## 2019-01-07 LAB — GLUCOSE, CAPILLARY: Glucose-Capillary: 401 mg/dL — ABNORMAL HIGH (ref 70–99)

## 2019-01-07 MED ORDER — AMLODIPINE BESYLATE 10 MG PO TABS: 10.0000 mg | ORAL_TABLET | Freq: Every day | ORAL | 3 refills | Status: DC

## 2019-01-07 MED ORDER — CHLORTHALIDONE 25 MG PO TABS: 25.0000 mg | ORAL_TABLET | Freq: Every day | ORAL | 1 refills | Status: DC

## 2019-01-07 MED ORDER — PREGABALIN 100 MG PO CAPS: 100.0000 mg | ORAL_CAPSULE | Freq: Three times a day (TID) | ORAL | 5 refills | Status: DC

## 2019-01-07 MED ORDER — LISINOPRIL 40 MG PO TABS: ORAL_TABLET | ORAL | 1 refills | Status: DC

## 2019-01-07 MED ORDER — SITAGLIPTIN PHOSPHATE 100 MG PO TABS: ORAL_TABLET | ORAL | 3 refills | Status: DC

## 2019-01-07 MED FILL — CHLORTHALIDONE 25 MG TABLET: 25 | 90 days supply | Qty: 90 | Fill #0

## 2019-01-07 MED FILL — PREGABALIN 100 MG CAPS: 100 | 30 days supply | Qty: 90 | Fill #1

## 2019-01-07 MED FILL — POTASSIUM CHL ER M10 TABLET: 10 | 7 days supply | Qty: 14 | Fill #0

## 2019-01-07 MED FILL — LISINOPRIL 40 MG TABLET: 40 | 90 days supply | Qty: 90 | Fill #0

## 2019-01-07 NOTE — Assessment & Plan Note (Signed)
BP Readings from Last 3 Encounters:  01/07/19 (!) 139/108  01/04/19 127/70  10/10/18 122/72   Her blood pressure was elevated today.  She ran out of her lisinopril for few days.  It seems like she should have some refills but not aware of that.  Reordered lisinopril along with amlodipine and chlorthalidone. Reassess during next follow-up visit.

## 2019-01-07 NOTE — Assessment & Plan Note (Addendum)
Her potassium was 2.5 during her recent ED visit which was repleted and she was given potassium pills to be taken for the next 3 days.  She has not started taking her potassium supplement.  -Check BMP today.  Addendum. K within normal limit, mildly elevated calcium.Will need repeat renal function during next follow up visit.
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
## 2019-01-07 NOTE — Assessment & Plan Note (Signed)
She continues to experience probably urea and polydipsia, has not restarted her Januvia yet. Patient she was unable to tolerate metformin in the past so she does not want to try it again. Previously her blood sugar was well controlled on Januvia which was discontinued by her provider in March as her A1c was 5.6. Today her A1c was 9.2 with CBG of 401.  She was not following any diabetic diet at this time.  -Restarted Januvia-advised patient to pick up her prescription today and start taking it. -She should check her blood sugar 3-4 times daily for next 2 weeks and bring her glucometer with her. -Follow-up in 2 weeks to check her blood sugar trend. -She was also advised to follow strict diabetic diet.

## 2019-01-07 NOTE — Patient Instructions (Signed)
Thank you for visiting clinic today. As we discussed you need to pick up your prescription from [place] and start taking your Januvia and potassium supplement. Your A1c and blood sugar is high, please check your blood sugar 3-4 times daily and bring your glucometer with your next follow-up appointment. Please follow your diabetic diet. We will recheck your potassium today-we will call you with any abnormal results. Continue taking all of your blood pressure medications as directed. Follow-up in 2 weeks.   Diabetes Mellitus and Nutrition, Adult When you have diabetes (diabetes mellitus), it is very important to have healthy eating habits because your blood sugar (glucose) levels are greatly affected by what you eat and drink. Eating healthy foods in the appropriate amounts, at about the same times every day, can help you:  Control your blood glucose.  Lower your risk of heart disease.  Improve your blood pressure.  Reach or maintain a healthy weight. Every person with diabetes is different, and each person has different needs for a meal plan. Your health care provider may recommend that you work with a diet and nutrition specialist (dietitian) to make a meal plan that is best for you. Your meal plan may vary depending on factors such as:  The calories you need.  The medicines you take.  Your weight.  Your blood glucose, blood pressure, and cholesterol levels.  Your activity level.  Other health conditions you have, such as heart or kidney disease. How do carbohydrates affect me? Carbohydrates, also called carbs, affect your blood glucose level more than any other type of food. Eating carbs naturally raises the amount of glucose in your blood. Carb counting is a method for keeping track of how many carbs you eat. Counting carbs is important to keep your blood glucose at a healthy level, especially if you use insulin or take certain oral diabetes medicines. It is important  to know how many carbs you can safely have in each meal. This is different for every person. Your dietitian can help you calculate how many carbs you should have at each meal and for each snack. Foods that contain carbs include:  Bread, cereal, rice, pasta, and crackers.  Potatoes and corn.  Peas, beans, and lentils.  Milk and yogurt.  Fruit and juice.  Desserts, such as cakes, cookies, ice cream, and candy. How does alcohol affect me? Alcohol can cause a sudden decrease in blood glucose (hypoglycemia), especially if you use insulin or take certain oral diabetes medicines. Hypoglycemia can be a life-threatening condition. Symptoms of hypoglycemia (sleepiness, dizziness, and confusion) are similar to symptoms of having too much alcohol. If your health care provider says that alcohol is safe for you, follow these guidelines:  Limit alcohol intake to no more than 1 drink per day for nonpregnant women and 2 drinks per day for men. One drink equals 12 oz of beer, 5 oz of wine, or 1 oz of hard liquor.  Do not drink on an empty stomach.  Keep yourself hydrated with water, diet soda, or unsweetened iced tea.  Keep in mind that regular soda, juice, and other mixers may contain a lot of sugar and must be counted as carbs. What are tips for following this plan?  Reading food labels  Start by checking the serving size on the "Nutrition Facts" label of packaged foods and drinks. The amount of calories, carbs, fats, and other nutrients listed on the label is based on one serving of the item. Many items contain more  than one serving per package.  Check the total grams (g) of carbs in one serving. You can calculate the number of servings of carbs in one serving by dividing the total carbs by 15. For example, if a food has 30 g of total carbs, it would be equal to 2 servings of carbs.  Check the number of grams (g) of saturated and trans fats in one serving. Choose foods that have low or no amount  of these fats.  Check the number of milligrams (mg) of salt (sodium) in one serving. Most people should limit total sodium intake to less than 2,300 mg per day.  Always check the nutrition information of foods labeled as "low-fat" or "nonfat". These foods may be higher in added sugar or refined carbs and should be avoided.  Talk to your dietitian to identify your daily goals for nutrients listed on the label. Shopping  Avoid buying canned, premade, or processed foods. These foods tend to be high in fat, sodium, and added sugar.  Shop around the outside edge of the grocery store. This includes fresh fruits and vegetables, bulk grains, fresh meats, and fresh dairy. Cooking  Use low-heat cooking methods, such as baking, instead of high-heat cooking methods like deep frying.  Cook using healthy oils, such as olive, canola, or sunflower oil.  Avoid cooking with butter, cream, or high-fat meats. Meal planning  Eat meals and snacks regularly, preferably at the same times every day. Avoid going long periods of time without eating.  Eat foods high in fiber, such as fresh fruits, vegetables, beans, and whole grains. Talk to your dietitian about how many servings of carbs you can eat at each meal.  Eat 4-6 ounces (oz) of lean protein each day, such as lean meat, chicken, fish, eggs, or tofu. One oz of lean protein is equal to: ? 1 oz of meat, chicken, or fish. ? 1 egg. ?  cup of tofu.  Eat some foods each day that contain healthy fats, such as avocado, nuts, seeds, and fish. Lifestyle  Check your blood glucose regularly.  Exercise regularly as told by your health care provider. This may include: ? 150 minutes of moderate-intensity or vigorous-intensity exercise each week. This could be brisk walking, biking, or water aerobics. ? Stretching and doing strength exercises, such as yoga or weightlifting, at least 2 times a week.  Take medicines as told by your health care provider.  Do not  use any products that contain nicotine or tobacco, such as cigarettes and e-cigarettes. If you need help quitting, ask your health care provider.  Work with a Social worker or diabetes educator to identify strategies to manage stress and any emotional and social challenges. Questions to ask a health care provider  Do I need to meet with a diabetes educator?  Do I need to meet with a dietitian?  What number can I call if I have questions?  When are the best times to check my blood glucose? Where to find more information:  American Diabetes Association: diabetes.org  Academy of Nutrition and Dietetics: www.eatright.[employer] of Diabetes and Digestive and Kidney Diseases (NIH): [employer] Summary  A healthy meal plan will help you control your blood glucose and maintain a healthy lifestyle.  Working with a diet and nutrition specialist (dietitian) can help you make a meal plan that is best for you.  Keep in mind that carbohydrates (carbs) and alcohol have immediate effects on your blood glucose levels. It is important to count carbs  and to use alcohol carefully. This information is not intended to replace advice given to you by your health care provider. Make sure you discuss any questions you have with your health care provider. Document Released: 04/21/2005 Document Revised: 02/22/2017 Document Reviewed: 08/29/2016 Elsevier Interactive Patient Education  2019 [employer].

## 2019-01-07 NOTE — Assessment & Plan Note (Signed)
Symptoms are controlled on Lyrica, she was asking for a refill which was provided.

## 2019-01-08 ENCOUNTER — Telehealth: Payer: Self-pay | Admitting: *Deleted

## 2019-01-08 LAB — BMP8+ANION GAP
Anion Gap: 19 mmol/L — ABNORMAL HIGH (ref 10.0–18.0)
BUN/Creatinine Ratio: 14 (ref 9–23)
BUN: 11 mg/dL (ref 6–24)
CO2: 22 mmol/L (ref 20–29)
Calcium: 10.3 mg/dL — ABNORMAL HIGH (ref 8.7–10.2)
Chloride: 95 mmol/L — ABNORMAL LOW (ref 96–106)
Creatinine, Ser: 0.8 mg/dL (ref 0.57–1.00)
GFR calc Af Amer: 93 mL/min/{1.73_m2} (ref >59)
GFR calc non Af Amer: 81 mL/min/{1.73_m2} (ref >59)
Glucose: 400 mg/dL — ABNORMAL HIGH (ref 65–99)
Potassium: 3.6 mmol/L (ref 3.5–5.2)
Sodium: 136 mmol/L (ref 134–144)

## 2019-01-08 MED FILL — JANUVIA 100 MG TABLET: 100 | 30 days supply | Qty: 30 | Fill #0

## 2019-01-08 MED FILL — ENBREL 50 MG/ML SOSY: 50 | 28 days supply | Qty: 4 | Fill #0

## 2019-01-08 NOTE — Telephone Encounter (Signed)
Call to [employer] for PA for Januvia 100 mg tablets.  Information was given to representative.  Approved 01/08/2019 thru 01/03/2020. [facility] was called and notified of approval.   PA [id number].  I [id number].  Sander Nephew, RN 01/08/2019 1:44 PM

## 2019-01-08 NOTE — Progress Notes (Signed)
Internal Medicine Clinic Attending  Case discussed with Dr. Amin at the time of the visit.  We reviewed the resident's history and exam and pertinent patient test results.  I agree with the assessment, diagnosis, and plan of care documented in the resident's note.    

## 2019-01-10 ENCOUNTER — Other Ambulatory Visit: Payer: Self-pay

## 2019-01-10 ENCOUNTER — Observation Stay: Admission: AD | Admit: 2019-01-10 | Payer: Medicaid Other | Source: Ambulatory Visit | Admitting: Internal Medicine

## 2019-01-10 ENCOUNTER — Other Ambulatory Visit: Payer: Self-pay | Admitting: Internal Medicine

## 2019-01-10 ENCOUNTER — Ambulatory Visit (INDEPENDENT_AMBULATORY_CARE_PROVIDER_SITE_OTHER): Payer: Medicaid Other | Admitting: Internal Medicine

## 2019-01-10 ENCOUNTER — Telehealth: Payer: Self-pay

## 2019-01-10 ENCOUNTER — Ambulatory Visit: Payer: Medicaid Other | Admitting: Pharmacist

## 2019-01-10 VITALS — BP 106/60 | HR 71 | Temp 98.2°F | Ht 63.0 in | Wt 137.7 lb

## 2019-01-10 DIAGNOSIS — Z90710 Acquired absence of both cervix and uterus: Secondary | ICD-10-CM

## 2019-01-10 DIAGNOSIS — B351 Tinea unguium: Secondary | ICD-10-CM

## 2019-01-10 DIAGNOSIS — E119 Type 2 diabetes mellitus without complications: Secondary | ICD-10-CM

## 2019-01-10 DIAGNOSIS — Z794 Long term (current) use of insulin: Secondary | ICD-10-CM

## 2019-01-10 DIAGNOSIS — E1165 Type 2 diabetes mellitus with hyperglycemia: Secondary | ICD-10-CM

## 2019-01-10 DIAGNOSIS — B373 Candidiasis of vulva and vagina: Secondary | ICD-10-CM

## 2019-01-10 DIAGNOSIS — B3731 Acute candidiasis of vulva and vagina: Secondary | ICD-10-CM

## 2019-01-10 LAB — BASIC METABOLIC PANEL
Anion gap: 12 (ref 5–15)
BUN: 8 mg/dL (ref 6–20)
CO2: 26 mmol/L (ref 22–32)
Calcium: 10 mg/dL (ref 8.9–10.3)
Chloride: 91 mmol/L — ABNORMAL LOW (ref 98–111)
Creatinine, Ser: 0.81 mg/dL (ref 0.44–1.00)
GFR calc Af Amer: >60 mL/min (ref >60)
GFR calc non Af Amer: >60 mL/min (ref >60)
Glucose, Bld: 479 mg/dL — ABNORMAL HIGH (ref 70–99)
Potassium: 3.4 mmol/L — ABNORMAL LOW (ref 3.5–5.1)
Sodium: 129 mmol/L — ABNORMAL LOW (ref 135–145)

## 2019-01-10 LAB — POCT URINALYSIS DIPSTICK
Bilirubin, UA: NEGATIVE
Glucose, UA: POSITIVE — AB
Ketones, UA: 15
Leukocytes, UA: NEGATIVE
Nitrite, UA: NEGATIVE
Protein, UA: NEGATIVE
Spec Grav, UA: 1.01 (ref 1.010–1.025)
Urobilinogen, UA: 0.2 E.U./dL
pH, UA: 5 (ref 5.0–8.0)

## 2019-01-10 LAB — GLUCOSE, CAPILLARY
Glucose-Capillary: 566 mg/dL (ref 70–99)
Glucose-Capillary: 473 mg/dL — ABNORMAL HIGH (ref 70–99)

## 2019-01-10 MED ORDER — INSULIN DEGLUDEC-LIRAGLUTIDE 100-3.6 UNIT-MG/ML ~~LOC~~ SOPN: 10.0000 [IU] | PEN_INJECTOR | Freq: Every day | SUBCUTANEOUS | 0 refills | Status: DC

## 2019-01-10 MED ORDER — GLUCOSE BLOOD VI STRP: 1.0000 | ORAL_STRIP | 6 refills | Status: DC

## 2019-01-10 MED ORDER — INSULIN GLARGINE 100 UNIT/ML SOLOSTAR PEN: 10.0000 [IU] | PEN_INJECTOR | Freq: Every day | SUBCUTANEOUS | 0 refills | Status: DC

## 2019-01-10 MED ORDER — INSULIN ASPART 100 UNIT/ML ~~LOC~~ SOLN
10.0000 [IU] | Freq: Once | SUBCUTANEOUS | Status: AC
  Administered 2019-01-10: 11:00:00 10 [IU] via SUBCUTANEOUS

## 2019-01-10 MED ORDER — FLUCONAZOLE 150 MG PO TABS: 150.0000 mg | ORAL_TABLET | Freq: Every day | ORAL | 0 refills | Status: DC

## 2019-01-10 MED FILL — ACCU-CHEK AVIVA PLUS STRP: 25 days supply | Qty: 100 | Fill #0

## 2019-01-10 MED FILL — FLUCONAZOLE 150 MG TABS: 150 | 1 days supply | Qty: 1 | Fill #0

## 2019-01-10 MED FILL — LANTUS SOLOSTAR 100 UNITS/M: 100 | 30 days supply | Qty: 3 | Fill #0

## 2019-01-10 NOTE — Assessment & Plan Note (Addendum)
Uncontrolled. Presents with symptomatic hyperglycemia in the setting of being taken off her diabetes medications. She appears dehydrated but is tolerating po intake. Stat labs were obtained which revealed ketonuria but normal bicarb and normal anion gap. She was given 10U novolog and CBG improved from 566 to 473 and her symptoms improved as well. Given this, I do not think hospital admission is warranted at this time. 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 Will check c-peptide and insulin levels.   - f/u c-peptide and insulin levels. If they indicate insulin deficiency, Lorraine [name] will be discontinued and she will be treated solely with insulin.  - Lantus 10U qhs, continue Januvia.  - encourage po intake  - donna provided diabetes education  - pharmacy students provided teaching on insulin administration  - pharmacy will call tomorrow to f/u - f/u in clinic on Monday, June 8th

## 2019-01-10 NOTE — Telephone Encounter (Signed)
Walk in:  Pt c/o of not feeling well, states her blood sugar finger stick was 458 this morning.  States she is taking her Januvia, but is insisting she needs SQ insulin.  Worried she will have to be hospitalized, requesting appt. Worked into [facility] schedule this am. Laurence Compton, RN,BSN

## 2019-01-10 NOTE — Progress Notes (Signed)
Lantus was reviewed with the patient, including name, instructions, indication, goals of therapy, potential side effects, importance of adherence, and safe use. Patient reports no concerns, she states she remembers how to use the insulin pen device from taking it in the past. Prescription was sent in for Lantus and test strips. Will work with PCP and patient to follow up and help with DM management. Patient verbalized understanding by repeating back information and was advised to contact me if medication-related questions arise.
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
## 2019-01-10 NOTE — Assessment & Plan Note (Addendum)
Describes two weeks of white vaginal discharge and itching in the setting of uncontrolled diabetes and polyuria with glucosuria. She is not sexually active. Denies vaginal bleeding and has h/o total hysterectomy for benign reasons. No fevers or chills. Doubt PID.  Will treat for presumed candida infection.   - fluconazole 150mg  once - f/u if symptoms do not improve

## 2019-01-10 NOTE — Patient Instructions (Signed)
It was nice seeing you today. Thank you for choosing [facility] for your Primary Care.   Today we talked about:   1) Diabetes:   -continue taking [place]  - start Lantus 10U daily   - check blood sugar 2-3 times per day  2) Vaginal itching:   - I think you have a yeast infection  - pick up fluconazole from your pharmacy and take as prescribed   FOLLOW-UP INSTRUCTIONS When: Monday, June 8th For: diabetes What to bring:   Please contact the clinic if you have any problems, or need to be seen sooner.

## 2019-01-11 ENCOUNTER — Telehealth: Payer: Self-pay | Admitting: Pharmacist

## 2019-01-11 DIAGNOSIS — E119 Type 2 diabetes mellitus without complications: Secondary | ICD-10-CM

## 2019-01-11 LAB — INSULIN, RANDOM: INSULIN: 5.5 u[IU]/mL (ref 2.6–24.9)

## 2019-01-11 LAB — C-PEPTIDE: C-Peptide: 1.7 ng/mL (ref 1.1–4.4)

## 2019-01-11 MED ORDER — INSULIN PEN NEEDLE 31G X 5 MM MISC: 11 refills | Status: DC

## 2019-01-11 MED FILL — UNIFINE PENTIPS 31GX3/16: 31G X 5 MM | 90 days supply | Qty: 100 | Fill #0

## 2019-01-11 MED FILL — UNIFINE PENTIPS 31GX3/16": 31G X 5 MM | 90 days supply | Qty: 100 | Fill #0

## 2019-01-11 MED FILL — NAPROXEN 500 MG TABLET: 500 | 30 days supply | Qty: 60 | Fill #0

## 2019-01-11 NOTE — Progress Notes (Signed)
Internal Medicine Clinic Attending  Case discussed with Dr. Donne Hazel  at the time of the visit.  We reviewed the resident's history and exam and pertinent patient test results.  I agree with the assessment, diagnosis, and plan of care documented in the resident's note.  Insulin levels are very low given degree of hyperglycemia. 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 We will work to add on short acting insulin to her mealtime coverage as well. Sitagliptin does not seem to be stimulating insulin secretion or having benefit on her hyperglycemia, can discontinue.

## 2019-01-11 NOTE — Progress Notes (Signed)
Patient was seen during a co-visit. See Dr. Cristal Generous note for details.

## 2019-01-11 NOTE — Progress Notes (Signed)
Called patient for follow up DM med changes. Patient states she was able to pick up Lantus and BG test strips, need more pen needles. Sent prescription for pen needles. Home BG this morning was 200 and patient feels better, no symptoms of concern today. She just started the Lantus today, so will take a few more days to fully evaluate. Patient states she was instructed to report back to [facility] on Monday 6/8, appointment scheduled.

## 2019-01-14 ENCOUNTER — Ambulatory Visit: Payer: Medicaid Other | Admitting: Internal Medicine

## 2019-01-14 ENCOUNTER — Other Ambulatory Visit: Payer: Self-pay

## 2019-01-14 VITALS — BP 121/79 | HR 76 | Temp 98.3°F | Ht 63.0 in | Wt 140.8 lb

## 2019-01-14 DIAGNOSIS — Z794 Long term (current) use of insulin: Secondary | ICD-10-CM | POA: Diagnosis not present

## 2019-01-14 DIAGNOSIS — B373 Candidiasis of vulva and vagina: Secondary | ICD-10-CM

## 2019-01-14 DIAGNOSIS — Z90710 Acquired absence of both cervix and uterus: Secondary | ICD-10-CM

## 2019-01-14 DIAGNOSIS — IMO0001 Reserved for inherently not codable concepts without codable children: Secondary | ICD-10-CM

## 2019-01-14 DIAGNOSIS — E1165 Type 2 diabetes mellitus with hyperglycemia: Secondary | ICD-10-CM

## 2019-01-14 LAB — GLUCOSE, CAPILLARY: Glucose-Capillary: 360 mg/dL — ABNORMAL HIGH (ref 70–99)

## 2019-01-14 MED ORDER — INSULIN ASPART 100 UNIT/ML FLEXPEN: 5.0000 [IU] | PEN_INJECTOR | Freq: Three times a day (TID) | SUBCUTANEOUS | 3 refills | Status: DC

## 2019-01-14 MED ORDER — INSULIN GLARGINE 100 UNIT/ML SOLOSTAR PEN: 15.0000 [IU] | PEN_INJECTOR | Freq: Every day | SUBCUTANEOUS | 0 refills | Status: DC

## 2019-01-14 MED FILL — NOVOLOG FLEXPEN SYRINGE: 100 | 20 days supply | Qty: 3 | Fill #0

## 2019-01-14 NOTE — Assessment & Plan Note (Addendum)
CBG 360 today. Compliant with glargine 10U qhs, januvia qd. CBGs remain elevated. No symptomatic lows. Lorraine [name] and I discussed insulin regiment and addition of mealtime insulin and discontinuation of januvia. Daily insulin requirement with basal and mealtime calculated to be 31U.   - d/c Januvia. She now has diagnosed insulin dependent diabetes based on low c-peptide and insulin levels taken at last visit while hyperglycemic - increase glargine from 10U to 15U - start novolog 5U tid wc  - check CBGs before each meal and at bedtime  - f/u in 2 weeks

## 2019-01-14 NOTE — Patient Instructions (Addendum)
It was nice seeing you today. Thank you for choosing [facility] for your Primary Care.   Today we talked about:   1) Diabetes: Keep up the good work! We are adding a second kind of insulin called Novolog (short acting) which you will take before each meal.   STOP Januvia   Follow this schedule:  8AM check blood sugar, take 5U Novolog insulin, then eat breakfast  12pm: check blood sugar, take 5U Novolog insulin, then eat lunch  4pm: check blood sugar, take 5U Novolog insulin, then eat dinner  Bedtime: check blood sugar, take 15U Lantus insulin   FOLLOW-UP INSTRUCTIONS When: 2 weeks For: diabetes What to bring: glucose meter   Please contact the clinic if you have any problems, or need to be seen sooner.
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
## 2019-01-18 NOTE — Progress Notes (Signed)
Internal Medicine Clinic Attending  Case discussed with Dr. Vogel  at the time of the visit.  We reviewed the resident's history and exam and pertinent patient test results.  I agree with the assessment, diagnosis, and plan of care documented in the resident's note.  

## 2019-01-21 ENCOUNTER — Telehealth: Payer: Self-pay | Admitting: Dietician

## 2019-01-21 NOTE — Telephone Encounter (Signed)
Lorraine [name] calls saying she is getting E-9 on her glucose meter. I helped her reset it while we were on the phone. This did not work. She agreed to get a new battery and if that doesn't;t work to call the office back.

## 2019-01-21 NOTE — Telephone Encounter (Signed)
Got a new battery and put it in her meter. Meter was asking for set up. We worked through this and she finally was able to check her blood sugar with result of 113mg /dl. She was happy with this results saying it is doing real good now. She'll call back if needed.

## 2019-01-22 ENCOUNTER — Ambulatory Visit: Payer: Medicaid Other

## 2019-01-23 ENCOUNTER — Other Ambulatory Visit: Payer: Self-pay | Admitting: Internal Medicine

## 2019-01-23 DIAGNOSIS — M797 Fibromyalgia: Secondary | ICD-10-CM

## 2019-01-23 MED FILL — PRAVASTATIN NA 40 MG TAB: 40 | 90 days supply | Qty: 90 | Fill #0

## 2019-01-28 ENCOUNTER — Other Ambulatory Visit: Payer: Self-pay

## 2019-01-28 ENCOUNTER — Encounter: Payer: Self-pay | Admitting: Internal Medicine

## 2019-01-28 ENCOUNTER — Ambulatory Visit: Payer: Medicaid Other | Admitting: Pharmacist

## 2019-01-28 ENCOUNTER — Ambulatory Visit: Payer: Medicaid Other | Admitting: Internal Medicine

## 2019-01-28 DIAGNOSIS — Z794 Long term (current) use of insulin: Secondary | ICD-10-CM

## 2019-01-28 DIAGNOSIS — E119 Type 2 diabetes mellitus without complications: Secondary | ICD-10-CM

## 2019-01-28 DIAGNOSIS — IMO0001 Reserved for inherently not codable concepts without codable children: Secondary | ICD-10-CM

## 2019-01-28 NOTE — Progress Notes (Signed)
Internal Medicine Clinic Attending  Case discussed with Dr. Vogel  at the time of the visit.  We reviewed the resident's history and exam and pertinent patient test results.  I agree with the assessment, diagnosis, and plan of care documented in the resident's note.  
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
## 2019-01-28 NOTE — Assessment & Plan Note (Signed)
Improving. CBGs overall much better controlled with addition of meal time insulin. Discussed making sure each meal is well balanced and similar in size in order to prevent symptomatic hypoglycemia.   - continue lantus 15U qhs, novolog 5U tid wc - f/u 2 months for repeat a1c

## 2019-01-28 NOTE — Patient Instructions (Addendum)
It was nice seeing you today. Thank you for choosing [facility] for your Primary Care.   Today we talked about:  1) Diabetes: You're doing great! Continue taking your mealtime and bedtime insulin as you have been doing. And continue checking your sugars before each meal and again at bedtime.    FOLLOW-UP INSTRUCTIONS When: 2 months  For: diabetes What to bring: glucometer    Please contact the clinic if you have any problems, or need to be seen sooner.

## 2019-01-30 MED FILL — NOVOLOG FLEXPEN SYRINGE: 100 | 20 days supply | Qty: 3 | Fill #1

## 2019-01-31 ENCOUNTER — Other Ambulatory Visit: Payer: Self-pay | Admitting: Internal Medicine

## 2019-01-31 DIAGNOSIS — IMO0001 Reserved for inherently not codable concepts without codable children: Secondary | ICD-10-CM

## 2019-02-01 MED FILL — LANTUS SOLOSTAR 100 UNITS/M: 100 | 30 days supply | Qty: 3 | Fill #0

## 2019-03-13 ENCOUNTER — Other Ambulatory Visit: Payer: Self-pay | Admitting: Internal Medicine

## 2019-03-13 DIAGNOSIS — M797 Fibromyalgia: Secondary | ICD-10-CM

## 2019-03-13 MED ORDER — VITAMIN B-12 1000 MCG PO TABS: 1000.0000 ug | ORAL_TABLET | Freq: Every day | ORAL | 5 refills | Status: AC

## 2019-03-27 ENCOUNTER — Encounter: Payer: Self-pay | Admitting: Internal Medicine

## 2019-03-27 ENCOUNTER — Ambulatory Visit: Payer: Medicaid Other | Admitting: Internal Medicine

## 2019-03-27 ENCOUNTER — Other Ambulatory Visit: Payer: Self-pay

## 2019-03-27 VITALS — BP 125/79 | HR 66 | Temp 98.4°F | Wt 142.0 lb

## 2019-03-27 DIAGNOSIS — IMO0001 Reserved for inherently not codable concepts without codable children: Secondary | ICD-10-CM

## 2019-03-27 DIAGNOSIS — Z720 Tobacco use: Secondary | ICD-10-CM

## 2019-03-27 DIAGNOSIS — E876 Hypokalemia: Secondary | ICD-10-CM

## 2019-03-27 DIAGNOSIS — E559 Vitamin D deficiency, unspecified: Secondary | ICD-10-CM

## 2019-03-27 DIAGNOSIS — I10 Essential (primary) hypertension: Secondary | ICD-10-CM | POA: Diagnosis not present

## 2019-03-27 DIAGNOSIS — E119 Type 2 diabetes mellitus without complications: Secondary | ICD-10-CM

## 2019-03-27 DIAGNOSIS — M069 Rheumatoid arthritis, unspecified: Secondary | ICD-10-CM | POA: Diagnosis not present

## 2019-03-27 DIAGNOSIS — Z0000 Encounter for general adult medical examination without abnormal findings: Secondary | ICD-10-CM

## 2019-03-27 DIAGNOSIS — M85852 Other specified disorders of bone density and structure, left thigh: Secondary | ICD-10-CM

## 2019-03-27 DIAGNOSIS — Z794 Long term (current) use of insulin: Secondary | ICD-10-CM

## 2019-03-27 DIAGNOSIS — M8580 Other specified disorders of bone density and structure, unspecified site: Secondary | ICD-10-CM

## 2019-03-27 DIAGNOSIS — Z23 Encounter for immunization: Secondary | ICD-10-CM

## 2019-03-27 DIAGNOSIS — Z79899 Other long term (current) drug therapy: Secondary | ICD-10-CM

## 2019-03-27 DIAGNOSIS — M059 Rheumatoid arthritis with rheumatoid factor, unspecified: Secondary | ICD-10-CM

## 2019-03-27 LAB — POCT GLYCOSYLATED HEMOGLOBIN (HGB A1C): Hemoglobin A1C: 6.7 % — AB (ref 4.0–5.6)

## 2019-03-27 LAB — GLUCOSE, CAPILLARY: Glucose-Capillary: 128 mg/dL — ABNORMAL HIGH (ref 70–99)
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
## 2019-03-27 NOTE — Assessment & Plan Note (Signed)
BP Readings from Last 3 Encounters:  03/27/19 125/79  01/28/19 121/87  01/14/19 121/79   Blood pressure stable and well controlled. She is currently taking norvasc 10 mg qd, hygroton 25 mg qd, lisinopril 40 mg qd. Potassium was slightly decreased during last visit, and she has been out of the potassium.   - BMP - will f/u with potassium dose after BMP   - continue current medications

## 2019-03-27 NOTE — Assessment & Plan Note (Signed)
Her last Dexa scan 04/2018 shows osteopenia. Calculated Frax score today, which is 7.2% risk of fracture and .1% risk of hip fracture and does not require bisphosphonate therapy. She has not been taking a vitamin D supplement.   - pick up Vitamin D supplement

## 2019-03-27 NOTE — Assessment & Plan Note (Signed)
-   tetanus shot today

## 2019-03-27 NOTE — Assessment & Plan Note (Signed)
She is picking her etanercept and pregabalin today for her RA. Pain well controlled. Follows up with rheum in October.   - continue current medications

## 2019-03-27 NOTE — Patient Instructions (Signed)
Thank you for allowing [place] to provide your care today. Today we discussed your type II diabetes and vitamin D deficiency.    I have ordered the following labs for you:  Basic metabolic panel and potassium.   I will call if any are abnormal.    Your hemoglobin A1C is down to 6.7 from 9.2. Great job!  Today we made the following changes to your medications:    Please follow-up in three months for a1c check.    Please call the internal medicine center clinic if you have any questions or concerns, we may be able to help and keep you from a long and expensive emergency room wait. Our clinic and after hours phone number is [phone], the best time to call is Monday through Friday 9 am to 4 pm but there is always someone available 24/7 if you have an emergency. If you need medication refills please notify your pharmacy one week in advance and they will send [place] a request.

## 2019-03-27 NOTE — Assessment & Plan Note (Signed)
She smokes 1-2 cigarette per day. Complete cessation counseling provided. Will discuss again at f/u.

## 2019-03-27 NOTE — Assessment & Plan Note (Signed)
She has been taking 10U lantus qhs, novolog 5U tid wc. A1c during last visit 9.2 Today a1c is 6.7. She has been checking her glucose at home and glucometer shows readings tid with average of 135, highest reading 215. She has not had any symptoms of hypoglycemia.   - continue current medications.  - ophthalmology appointment scheduled for October - f/u three months for a1c check.

## 2019-03-28 ENCOUNTER — Other Ambulatory Visit: Payer: Self-pay | Admitting: Internal Medicine

## 2019-03-28 DIAGNOSIS — E876 Hypokalemia: Secondary | ICD-10-CM

## 2019-03-28 LAB — BMP8+ANION GAP
Anion Gap: 19 mmol/L — ABNORMAL HIGH (ref 10.0–18.0)
BUN/Creatinine Ratio: 14 (ref 12–28)
BUN: 13 mg/dL (ref 8–27)
CO2: 25 mmol/L (ref 20–29)
Calcium: 10 mg/dL (ref 8.7–10.3)
Chloride: 97 mmol/L (ref 96–106)
Creatinine, Ser: 0.96 mg/dL (ref 0.57–1.00)
GFR calc Af Amer: 74 mL/min/{1.73_m2} (ref >59)
GFR calc non Af Amer: 64 mL/min/{1.73_m2} (ref >59)
Glucose: 101 mg/dL — ABNORMAL HIGH (ref 65–99)
Potassium: 3.1 mmol/L — ABNORMAL LOW (ref 3.5–5.2)
Sodium: 141 mmol/L (ref 134–144)

## 2019-03-28 MED ORDER — POTASSIUM CHLORIDE ER 10 MEQ PO TBCR: EXTENDED_RELEASE_TABLET | ORAL | 0 refills | Status: DC

## 2019-03-28 NOTE — Progress Notes (Signed)
Internal Medicine Clinic Attending  Case discussed with Dr. Seawell at the time of the visit.  We reviewed the resident's history and exam and pertinent patient test results.  I agree with the assessment, diagnosis, and plan of care documented in the resident's note.    

## 2019-04-02 ENCOUNTER — Other Ambulatory Visit: Payer: Self-pay

## 2019-04-02 ENCOUNTER — Other Ambulatory Visit (INDEPENDENT_AMBULATORY_CARE_PROVIDER_SITE_OTHER): Payer: Medicaid Other

## 2019-04-02 DIAGNOSIS — E876 Hypokalemia: Secondary | ICD-10-CM | POA: Diagnosis present

## 2019-04-03 LAB — BMP8+ANION GAP
Anion Gap: 16 mmol/L (ref 10.0–18.0)
BUN/Creatinine Ratio: 17 (ref 12–28)
BUN: 12 mg/dL (ref 8–27)
CO2: 24 mmol/L (ref 20–29)
Calcium: 9.6 mg/dL (ref 8.7–10.3)
Chloride: 100 mmol/L (ref 96–106)
Creatinine, Ser: 0.7 mg/dL (ref 0.57–1.00)
GFR calc Af Amer: 109 mL/min/{1.73_m2} (ref >59)
GFR calc non Af Amer: 94 mL/min/{1.73_m2} (ref >59)
Glucose: 77 mg/dL (ref 65–99)
Potassium: 3.5 mmol/L (ref 3.5–5.2)
Sodium: 140 mmol/L (ref 134–144)

## 2019-04-08 ENCOUNTER — Other Ambulatory Visit: Payer: Self-pay | Admitting: *Deleted

## 2019-04-08 DIAGNOSIS — IMO0001 Reserved for inherently not codable concepts without codable children: Secondary | ICD-10-CM

## 2019-04-08 MED ORDER — INSULIN ASPART 100 UNIT/ML FLEXPEN: 5.0000 [IU] | PEN_INJECTOR | Freq: Three times a day (TID) | SUBCUTANEOUS | 1 refills | Status: DC

## 2019-04-09 ENCOUNTER — Telehealth: Payer: Self-pay

## 2019-04-09 ENCOUNTER — Other Ambulatory Visit: Payer: Self-pay | Admitting: Internal Medicine

## 2019-04-09 NOTE — Telephone Encounter (Signed)
Requesting to speak with a nurse about meds, please call pt back.  

## 2019-04-09 NOTE — Telephone Encounter (Signed)
[facility] went to [facility], prescribed her some med for yeast the drugstore told her it would mess her liver up, she also had a rash taking it and the dr at [facility] told her not to take it anymore Also she is having vag yeast and would like some diflucan

## 2019-04-10 ENCOUNTER — Telehealth: Payer: Self-pay

## 2019-04-10 DIAGNOSIS — IMO0001 Reserved for inherently not codable concepts without codable children: Secondary | ICD-10-CM

## 2019-04-10 NOTE — Telephone Encounter (Signed)
Needs refill of lantus Also needs blood pressure cuff at next visit Please order [facility] for medication management.

## 2019-04-10 NOTE — Telephone Encounter (Signed)
Lorraine [name] with [facility] [place], requesting to speak with a nurse about meds and providing care management services.

## 2019-04-11 ENCOUNTER — Other Ambulatory Visit: Payer: Self-pay

## 2019-04-11 ENCOUNTER — Ambulatory Visit (INDEPENDENT_AMBULATORY_CARE_PROVIDER_SITE_OTHER): Payer: Medicaid Other | Admitting: Internal Medicine

## 2019-04-11 ENCOUNTER — Encounter: Payer: Self-pay | Admitting: Internal Medicine

## 2019-04-11 VITALS — BP 117/83 | HR 67 | Temp 98.3°F | Ht 63.0 in | Wt 142.1 lb

## 2019-04-11 DIAGNOSIS — T490X5A Adverse effect of local antifungal, anti-infective and anti-inflammatory drugs, initial encounter: Secondary | ICD-10-CM | POA: Diagnosis not present

## 2019-04-11 DIAGNOSIS — Z888 Allergy status to other drugs, medicaments and biological substances status: Secondary | ICD-10-CM | POA: Diagnosis not present

## 2019-04-11 DIAGNOSIS — B3731 Acute candidiasis of vulva and vagina: Secondary | ICD-10-CM

## 2019-04-11 DIAGNOSIS — L918 Other hypertrophic disorders of the skin: Secondary | ICD-10-CM | POA: Diagnosis not present

## 2019-04-11 DIAGNOSIS — B373 Candidiasis of vulva and vagina: Secondary | ICD-10-CM

## 2019-04-11 DIAGNOSIS — Z792 Long term (current) use of antibiotics: Secondary | ICD-10-CM | POA: Diagnosis not present

## 2019-04-11 DIAGNOSIS — R740 Nonspecific elevation of levels of transaminase and lactic acid dehydrogenase [LDH]: Secondary | ICD-10-CM

## 2019-04-11 DIAGNOSIS — B351 Tinea unguium: Secondary | ICD-10-CM

## 2019-04-11 DIAGNOSIS — E119 Type 2 diabetes mellitus without complications: Secondary | ICD-10-CM

## 2019-04-11 DIAGNOSIS — L271 Localized skin eruption due to drugs and medicaments taken internally: Secondary | ICD-10-CM

## 2019-04-11 DIAGNOSIS — B182 Chronic viral hepatitis C: Secondary | ICD-10-CM

## 2019-04-11 DIAGNOSIS — Z79899 Other long term (current) drug therapy: Secondary | ICD-10-CM | POA: Diagnosis not present

## 2019-04-11 MED ORDER — FLUCONAZOLE 150 MG PO TABS: 150.0000 mg | ORAL_TABLET | Freq: Once | ORAL | 0 refills | Status: AC

## 2019-04-11 MED ORDER — LANTUS SOLOSTAR 100 UNIT/ML ~~LOC~~ SOPN: 10.0000 [IU] | PEN_INJECTOR | Freq: Every day | SUBCUTANEOUS | 1 refills | Status: DC

## 2019-04-11 NOTE — Addendum Note (Signed)
Addended by: Molli Hazard A on: 04/11/2019 07:20 PM   Modules accepted: Orders

## 2019-04-11 NOTE — Telephone Encounter (Signed)
Could we schedule a telehealth or office visit to assess the yeast infection?

## 2019-04-11 NOTE — Patient Instructions (Signed)
It was nice seeing you today! Thank you for choosing [facility] for your Primary Care.    Today we talked about:   Yeast Infection: I have sent a prescription for Fluconazole to your pharmacy. It is a one time dose. If after 1 week you continue to have discomfort, please call the clinic.  Have a great day!

## 2019-04-12 ENCOUNTER — Encounter: Payer: Self-pay | Admitting: Internal Medicine

## 2019-04-12 NOTE — Progress Notes (Signed)
Internal Medicine Clinic Attending  I saw and evaluated the patient.  I personally confirmed the key portions of the history and exam documented by Dr. Basaraba and I reviewed pertinent patient test results.  The assessment, diagnosis, and plan were formulated together and I agree with the documentation in the resident's note.  Alexander Raines, M.D., Ph.D. 

## 2019-04-12 NOTE — Assessment & Plan Note (Signed)
Lorraine [name] presented to the clinic with complaints of vaginal burning and irritation. Physical examination consistent with vaginal candidiasis.   Plan:  - Fluconazole 150mg  one time dose
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
## 2019-04-12 NOTE — Assessment & Plan Note (Signed)
Patient follows up with Podiatry for this issue. She was recently started on Terbinafine, however she had a reaction on first dose that consisted of facial rash and erythema. Additionally, this medication is contraindicated for patient's with liver diease, so we have instructed patient to not take again. She brought the bottle to her appointment today and we discarded the rest of prescription.   Discussed that alternative treatment, Itraconazole, still runs the risk of liver injury. She opted to discontinue all oral treatment for toe fungus for now and only continue topical treatment.    Plan:  - Topical cream per podiatry - Discontinued Terbinafine

## 2019-04-17 ENCOUNTER — Other Ambulatory Visit: Payer: Self-pay | Admitting: Internal Medicine

## 2019-04-24 ENCOUNTER — Encounter: Payer: Self-pay | Admitting: Gastroenterology

## 2019-05-15 LAB — HM DIABETES EYE EXAM

## 2019-05-16 ENCOUNTER — Encounter: Payer: Self-pay | Admitting: *Deleted

## 2019-05-24 ENCOUNTER — Other Ambulatory Visit: Payer: Self-pay | Admitting: Obstetrics & Gynecology

## 2019-05-24 DIAGNOSIS — Z1231 Encounter for screening mammogram for malignant neoplasm of breast: Secondary | ICD-10-CM

## 2019-06-07 ENCOUNTER — Other Ambulatory Visit: Payer: Self-pay | Admitting: Internal Medicine

## 2019-06-07 DIAGNOSIS — M797 Fibromyalgia: Secondary | ICD-10-CM

## 2019-06-26 ENCOUNTER — Other Ambulatory Visit: Payer: Self-pay

## 2019-06-26 ENCOUNTER — Ambulatory Visit: Payer: Medicaid Other | Admitting: Internal Medicine

## 2019-06-26 ENCOUNTER — Encounter: Payer: Self-pay | Admitting: Internal Medicine

## 2019-06-26 VITALS — BP 128/92 | HR 68 | Temp 98.4°F | Wt 141.2 lb

## 2019-06-26 DIAGNOSIS — M069 Rheumatoid arthritis, unspecified: Secondary | ICD-10-CM | POA: Diagnosis not present

## 2019-06-26 DIAGNOSIS — Z800 Family history of malignant neoplasm of digestive organs: Secondary | ICD-10-CM

## 2019-06-26 DIAGNOSIS — Z79899 Other long term (current) drug therapy: Secondary | ICD-10-CM

## 2019-06-26 DIAGNOSIS — I10 Essential (primary) hypertension: Secondary | ICD-10-CM

## 2019-06-26 DIAGNOSIS — E119 Type 2 diabetes mellitus without complications: Secondary | ICD-10-CM | POA: Diagnosis present

## 2019-06-26 DIAGNOSIS — M059 Rheumatoid arthritis with rheumatoid factor, unspecified: Secondary | ICD-10-CM

## 2019-06-26 DIAGNOSIS — Z0000 Encounter for general adult medical examination without abnormal findings: Secondary | ICD-10-CM

## 2019-06-26 DIAGNOSIS — Z794 Long term (current) use of insulin: Secondary | ICD-10-CM

## 2019-06-26 DIAGNOSIS — R7401 Elevation of levels of liver transaminase levels: Secondary | ICD-10-CM | POA: Diagnosis not present

## 2019-06-26 LAB — POCT GLYCOSYLATED HEMOGLOBIN (HGB A1C): Hemoglobin A1C: 5.8 % — AB (ref 4.0–5.6)

## 2019-06-26 LAB — GLUCOSE, CAPILLARY: Glucose-Capillary: 104 mg/dL — ABNORMAL HIGH (ref 70–99)
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
## 2019-06-26 NOTE — Patient Instructions (Addendum)
Thank you for allowing [place] to provide your care today. Today we discussed type II diabetes.     I have ordered the following labs for you:    I will call if any are abnormal.    Today we made the following changes to your medications:   Please STOP taking   Novolog Insulin  Please continue to check your glucose three times per day. If you notice that your glucose is consistently elevated >150 prior to meals, please call the office to schedule a telehealth visit.   Please follow-up in three months for hemoglobin a1c.   For your colonoscopy, please do not take your Lantus the night before your procedure.    Please call the internal medicine center clinic if you have any questions or concerns, we may be able to help and keep you from a long and expensive emergency room wait. Our clinic and after hours phone number is [phone], the best time to call is Monday through Friday 9 am to 4 pm but there is always someone available 24/7 if you have an emergency. If you need medication refills please notify your pharmacy one week in advance and they will send [place] a request.

## 2019-06-27 DIAGNOSIS — R7401 Elevation of levels of liver transaminase levels: Secondary | ICD-10-CM | POA: Insufficient documentation

## 2019-06-27 LAB — BMP8+ANION GAP
Anion Gap: 15 mmol/L (ref 10.0–18.0)
BUN/Creatinine Ratio: 8 — ABNORMAL LOW (ref 12–28)
BUN: 7 mg/dL — ABNORMAL LOW (ref 8–27)
CO2: 29 mmol/L (ref 20–29)
Calcium: 10.6 mg/dL — ABNORMAL HIGH (ref 8.7–10.3)
Chloride: 97 mmol/L (ref 96–106)
Creatinine, Ser: 0.85 mg/dL (ref 0.57–1.00)
GFR calc Af Amer: 86 mL/min/{1.73_m2} (ref >59)
GFR calc non Af Amer: 75 mL/min/{1.73_m2} (ref >59)
Glucose: 108 mg/dL — ABNORMAL HIGH (ref 65–99)
Potassium: 3.4 mmol/L — ABNORMAL LOW (ref 3.5–5.2)
Sodium: 141 mmol/L (ref 134–144)

## 2019-06-27 LAB — HEPATIC FUNCTION PANEL
ALT: 16 IU/L (ref 0–32)
AST: 154 IU/L — ABNORMAL HIGH (ref 0–40)
Albumin: 4.5 g/dL (ref 3.8–4.9)
Alkaline Phosphatase: 75 IU/L (ref 39–117)
Bilirubin Total: 0.2 mg/dL (ref 0.0–1.2)
Bilirubin, Direct: 0.08 mg/dL (ref 0.00–0.40)
Total Protein: 7.5 g/dL (ref 6.0–8.5)

## 2019-06-27 NOTE — Assessment & Plan Note (Signed)
-   last colonoscopy 2015. She is due for repeat colonoscopy with Dr. Olevia Perches in 5 years for family history of GI malignancies - f/u with [place]  - had flu shot in September

## 2019-06-27 NOTE — Assessment & Plan Note (Signed)
Last A1c 6.7. Today 5.8. Glucometer shows glucose within range. No symptoms of hypoglycemia. Optho exam without retinopathy.    - stop novolog as she is significantly below goal. Cont. lantus 10U - cont. Checking glucose and call clinic if she starts to become significantly hyperglycemic.  - f/u 3 months for a1c.  - cont. Pravastatin

## 2019-06-27 NOTE — Assessment & Plan Note (Signed)
GI referral placed per rheum for ongoing AST elevation with possible biopsy with concerns for sustained virologic response s/p successful HCV treatment with Harvoni. She has Hep B immunity.  - check LFTs today - f/u GI referral - appointment on 12/07 - rheum continuing Enbrel with stable ALT

## 2019-06-27 NOTE — Assessment & Plan Note (Signed)
This problem is chronic and stable. She is taking Etanercept 50 mg qweekly and follows with [facility].   - cont. Etanercept & pregabalin - f/u with Rheum one year

## 2019-06-27 NOTE — Assessment & Plan Note (Signed)
BP Readings from Last 3 Encounters:  06/26/19 (!) 128/92  04/11/19 117/83  03/27/19 125/79  On norvasc 10 mg qd, hygroton 25 mg qd, lisinopril 40 mg qd.   - BMP today with history of hypokalemia - cont. Current medications

## 2019-07-01 ENCOUNTER — Other Ambulatory Visit: Payer: Self-pay | Admitting: Internal Medicine

## 2019-07-01 DIAGNOSIS — E876 Hypokalemia: Secondary | ICD-10-CM

## 2019-07-01 MED ORDER — POTASSIUM CHLORIDE ER 10 MEQ PO TBCR: 10.0000 meq | EXTENDED_RELEASE_TABLET | Freq: Every day | ORAL | 2 refills | Status: DC

## 2019-07-01 NOTE — Progress Notes (Signed)
Called patient to discuss results. Refilled her potassium 10 mEq which she has not been taking since it ran out. She is taking Ca supplements BID otc per her rheumatologist, but I am not sure of the exact dosing as she states it's 50mg . Will decrease Ca supplementation to qd and have her follow-up 12/18 for repeat BMP.

## 2019-07-02 NOTE — Progress Notes (Signed)
Internal Medicine Clinic Attending  Case discussed with Dr. Seawell at the time of the visit.  We reviewed the resident's history and exam and pertinent patient test results.  I agree with the assessment, diagnosis, and plan of care documented in the resident's note.    

## 2019-07-15 ENCOUNTER — Other Ambulatory Visit: Payer: Self-pay

## 2019-07-15 ENCOUNTER — Ambulatory Visit
Admission: RE | Admit: 2019-07-15 | Discharge: 2019-07-15 | Disposition: A | Discharge status: Home / Self Care | Payer: Medicaid Other | Source: Ambulatory Visit | Attending: Obstetrics & Gynecology | Admitting: Obstetrics & Gynecology

## 2019-07-15 DIAGNOSIS — Z1231 Encounter for screening mammogram for malignant neoplasm of breast: Secondary | ICD-10-CM

## 2019-07-26 ENCOUNTER — Other Ambulatory Visit (INDEPENDENT_AMBULATORY_CARE_PROVIDER_SITE_OTHER): Payer: Medicaid Other

## 2019-07-26 DIAGNOSIS — E876 Hypokalemia: Secondary | ICD-10-CM | POA: Diagnosis not present

## 2019-07-27 LAB — BMP8+ANION GAP
Anion Gap: 17 mmol/L (ref 10.0–18.0)
BUN/Creatinine Ratio: 16 (ref 12–28)
BUN: 11 mg/dL (ref 8–27)
CO2: 25 mmol/L (ref 20–29)
Calcium: 9.9 mg/dL (ref 8.7–10.3)
Chloride: 95 mmol/L — ABNORMAL LOW (ref 96–106)
Creatinine, Ser: 0.67 mg/dL (ref 0.57–1.00)
GFR calc Af Amer: 110 mL/min/{1.73_m2} (ref >59)
GFR calc non Af Amer: 96 mL/min/{1.73_m2} (ref >59)
Glucose: 97 mg/dL (ref 65–99)
Potassium: 3.2 mmol/L — ABNORMAL LOW (ref 3.5–5.2)
Sodium: 137 mmol/L (ref 134–144)

## 2019-08-14 ENCOUNTER — Other Ambulatory Visit: Payer: Self-pay | Admitting: Internal Medicine

## 2019-10-23 ENCOUNTER — Ambulatory Visit: Payer: Medicaid Other | Admitting: Internal Medicine

## 2019-10-23 ENCOUNTER — Encounter: Payer: Self-pay | Admitting: Internal Medicine

## 2019-10-23 VITALS — BP 129/83 | HR 72 | Temp 98.9°F | Wt 136.7 lb

## 2019-10-23 DIAGNOSIS — I10 Essential (primary) hypertension: Secondary | ICD-10-CM

## 2019-10-23 DIAGNOSIS — J328 Other chronic sinusitis: Secondary | ICD-10-CM

## 2019-10-23 DIAGNOSIS — J329 Chronic sinusitis, unspecified: Secondary | ICD-10-CM

## 2019-10-23 DIAGNOSIS — E119 Type 2 diabetes mellitus without complications: Secondary | ICD-10-CM

## 2019-10-23 DIAGNOSIS — Z79899 Other long term (current) drug therapy: Secondary | ICD-10-CM | POA: Diagnosis not present

## 2019-10-23 DIAGNOSIS — K7401 Hepatic fibrosis, early fibrosis: Secondary | ICD-10-CM | POA: Diagnosis not present

## 2019-10-23 DIAGNOSIS — R300 Dysuria: Secondary | ICD-10-CM | POA: Diagnosis not present

## 2019-10-23 DIAGNOSIS — Z720 Tobacco use: Secondary | ICD-10-CM | POA: Diagnosis not present

## 2019-10-23 DIAGNOSIS — E876 Hypokalemia: Secondary | ICD-10-CM | POA: Diagnosis not present

## 2019-10-23 DIAGNOSIS — Z794 Long term (current) use of insulin: Secondary | ICD-10-CM | POA: Diagnosis not present

## 2019-10-23 DIAGNOSIS — T3695XA Adverse effect of unspecified systemic antibiotic, initial encounter: Secondary | ICD-10-CM | POA: Insufficient documentation

## 2019-10-23 DIAGNOSIS — Z800 Family history of malignant neoplasm of digestive organs: Secondary | ICD-10-CM

## 2019-10-23 DIAGNOSIS — Z90710 Acquired absence of both cervix and uterus: Secondary | ICD-10-CM | POA: Diagnosis not present

## 2019-10-23 DIAGNOSIS — B3749 Other urogenital candidiasis: Secondary | ICD-10-CM | POA: Diagnosis not present

## 2019-10-23 DIAGNOSIS — B182 Chronic viral hepatitis C: Secondary | ICD-10-CM

## 2019-10-23 DIAGNOSIS — R7401 Elevation of levels of liver transaminase levels: Secondary | ICD-10-CM

## 2019-10-23 DIAGNOSIS — Z8619 Personal history of other infectious and parasitic diseases: Secondary | ICD-10-CM

## 2019-10-23 DIAGNOSIS — B379 Candidiasis, unspecified: Secondary | ICD-10-CM | POA: Insufficient documentation

## 2019-10-23 DIAGNOSIS — Z8742 Personal history of other diseases of the female genital tract: Secondary | ICD-10-CM

## 2019-10-23 LAB — POCT URINALYSIS DIPSTICK
Bilirubin, UA: NEGATIVE
Blood, UA: NEGATIVE
Glucose, UA: NEGATIVE
Ketones, UA: NEGATIVE
Leukocytes, UA: NEGATIVE
Nitrite, UA: NEGATIVE
Protein, UA: NEGATIVE
Spec Grav, UA: 1.025 (ref 1.010–1.025)
Urobilinogen, UA: 0.2 E.U./dL
pH, UA: 6 (ref 5.0–8.0)

## 2019-10-23 LAB — GLUCOSE, CAPILLARY: Glucose-Capillary: 148 mg/dL — ABNORMAL HIGH (ref 70–99)

## 2019-10-23 LAB — POCT GLYCOSYLATED HEMOGLOBIN (HGB A1C): Hemoglobin A1C: 6.3 % — AB (ref 4.0–5.6)

## 2019-10-23 MED ORDER — AMOXICILLIN-POT CLAVULANATE 875-125 MG PO TABS: 1.0000 | ORAL_TABLET | Freq: Two times a day (BID) | ORAL | 0 refills | Status: AC

## 2019-10-23 MED ORDER — MONISTAT 1 COMBO PACK 1200 & 2 MG & % VA KIT: 1.0000 | PACK | Freq: Once | VAGINAL | 0 refills | Status: AC

## 2019-10-23 MED ORDER — TRULICITY 0.75 MG/0.5ML ~~LOC~~ SOAJ: 0.7500 mg | SUBCUTANEOUS | 8 refills | Status: DC

## 2019-10-23 MED ORDER — MONISTAT 1 COMBO PACK 1200 & 2 MG & % VA KIT: 1.0000 | PACK | Freq: Once | VAGINAL | 0 refills | Status: DC

## 2019-10-23 MED ORDER — AMOXICILLIN-POT CLAVULANATE 875-125 MG PO TABS: 1.0000 | ORAL_TABLET | Freq: Two times a day (BID) | ORAL | 0 refills | Status: DC

## 2019-10-23 NOTE — Assessment & Plan Note (Signed)
Hemoglobin a1c at last visit 5.8 four months ago. She was continued on lantus 10U and her novolog tid was stopped. A1c today 6.3. Glucose ranges between 160 and 250. Discussed starting trulicity, to which she agrees.    - cont. lantus 10U qhs  - start Trulicity  - f/u two weeks for glucose check.

## 2019-10-23 NOTE — Assessment & Plan Note (Signed)
BP Readings from Last 3 Encounters:  10/23/19 129/83  06/26/19 (!) 128/92  04/11/19 117/83  Current medications include norvasc 10 mg qd, hygroton 25 mg qd, and lisinopril 40 mg qd. Blood pressure well controlled.   - BMP - cont. Current medications
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
## 2019-10-23 NOTE — Assessment & Plan Note (Signed)
She has been having increased sneezing, runny nose, sore throat, pain behind eyes, and increased sinus headaches around the forehead and maxillary area. She denies fever or chills, cough or shortness of breath. This has been going on for two weeks. When she blows her nose it looks green. TTP in frontal and maxillary sinus region.  She has a history of chronic sinus infections. Per prev PCP notes she was seen by ENT with plans for CT if symptoms continued. I cannot see these notes but with recurrence of symptoms would like to obtain Ct.   - augmentin 875 bid 7 days - maxillofacial CT

## 2019-10-23 NOTE — Assessment & Plan Note (Signed)
Symptoms just started this past weekend and are similar to previous yeast infections. She thinks it's related to her diabetes. She has burning when she pees. She denies vaginal discharge or smell. No difficulty peeing but has had polydipsia and polyuria. She is not sexually active. She is not sure when her last yeast infection was, maybe around a year ago. She is not sexually active and had a total hysterectomy years ago. She does not agree to a vaginal exam today.   - UA - UA wnl  - monistat - return if symptoms do not improve.

## 2019-10-23 NOTE — Assessment & Plan Note (Signed)
History of hypokalemia secondary to her hygroton. She is currently taking K 20mEq per day.   - BMP today

## 2019-10-23 NOTE — Assessment & Plan Note (Signed)
Smokes 1-2 cigarettes per day. Some days she doesn't smoke at all and continues to work toward complete smoking cessation.

## 2019-10-23 NOTE — Patient Instructions (Addendum)
Thank you for allowing [place] to provide your care today. Today we discussed your type II diabetes.     I have ordered the following labs for you:  Basic metabolic panel    I will call if any are abnormal.    Today we made the following changes to your medications:   Please START taking   Monistat - use once daily for seven days  Augmentin - take one pill every 12 hours for seven days   Trulicity - inject .75 mg once weekly  I have ordered a CT imaging study to evaluate your recurrent sinus infections.   Please follow-up in four weeks for glucose check.    Please call the internal medicine center clinic if you have any questions or concerns, we may be able to help and keep you from a long and expensive emergency room wait. Our clinic and after hours phone number is [phone], the best time to call is Monday through Friday 9 am to 4 pm but there is always someone available 24/7 if you have an emergency. If you need medication refills please notify your pharmacy one week in advance and they will send [place] a request.

## 2019-10-24 LAB — BMP8+ANION GAP
Anion Gap: 17 mmol/L (ref 10.0–18.0)
BUN/Creatinine Ratio: 18 (ref 12–28)
BUN: 15 mg/dL (ref 8–27)
CO2: 24 mmol/L (ref 20–29)
Calcium: 10.1 mg/dL (ref 8.7–10.3)
Chloride: 96 mmol/L (ref 96–106)
Creatinine, Ser: 0.83 mg/dL (ref 0.57–1.00)
GFR calc Af Amer: 89 mL/min/{1.73_m2} (ref >59)
GFR calc non Af Amer: 77 mL/min/{1.73_m2} (ref >59)
Glucose: 111 mg/dL — ABNORMAL HIGH (ref 65–99)
Potassium: 3.4 mmol/L — ABNORMAL LOW (ref 3.5–5.2)
Sodium: 137 mmol/L (ref 134–144)

## 2019-10-25 ENCOUNTER — Encounter: Payer: Self-pay | Admitting: Internal Medicine

## 2019-10-25 ENCOUNTER — Telehealth: Payer: Self-pay | Admitting: *Deleted

## 2019-10-25 NOTE — Progress Notes (Signed)
Internal Medicine Clinic Attending  Case discussed with Dr. Seawell at the time of the visit.  We reviewed the resident's history and exam and pertinent patient test results.  I agree with the assessment, diagnosis, and plan of care documented in the resident's note.    

## 2019-10-25 NOTE — Telephone Encounter (Signed)
Patient's Trulicity is not on the preferred list for patient's insurance patient will need to try and fail, Victoza, Byetta or Bydureon.  Message to be sent to Dr.  Sharon Seller to consider a change.  Sander Nephew, RN 10/25/2019 11:56 AM

## 2019-10-25 NOTE — Assessment & Plan Note (Signed)
GI referral placed 05/2019 by rheumatology for elevated liver enzymes. She is s/p havroni treatment for Hep C with undetectable viral load 2015 & 2019 but there was concern with ongoing inflammation. She met with GI and had a liver biopsy 08/2019 which showed mild findings consistent with previous Hep C grade 0/4 activity and stage 1/4 fibrosis.

## 2019-10-28 ENCOUNTER — Telehealth: Payer: Self-pay

## 2019-10-28 NOTE — Telephone Encounter (Signed)
Pt states she cannot get her trulicity, states phillip at [facility] states they have not heard from [facility] about PA. She states she feels bad and cbg's are elevated, having diarrhea also. appt 3/23 at [address] [facility] Will need something else ordered per note 3/19

## 2019-10-28 NOTE — Telephone Encounter (Signed)
Requesting to speak with a nurse about insulin, please call pt back.

## 2019-10-29 ENCOUNTER — Other Ambulatory Visit: Payer: Self-pay

## 2019-10-29 ENCOUNTER — Ambulatory Visit: Payer: Medicaid Other

## 2019-10-29 ENCOUNTER — Telehealth: Payer: Self-pay | Admitting: *Deleted

## 2019-10-29 ENCOUNTER — Ambulatory Visit (INDEPENDENT_AMBULATORY_CARE_PROVIDER_SITE_OTHER): Payer: Medicaid Other | Admitting: Internal Medicine

## 2019-10-29 VITALS — BP 109/88 | HR 85 | Temp 97.6°F | Ht 63.0 in | Wt 136.7 lb

## 2019-10-29 DIAGNOSIS — Z794 Long term (current) use of insulin: Secondary | ICD-10-CM

## 2019-10-29 DIAGNOSIS — E119 Type 2 diabetes mellitus without complications: Secondary | ICD-10-CM | POA: Diagnosis present

## 2019-10-29 MED ORDER — LIRAGLUTIDE 18 MG/3ML ~~LOC~~ SOPN: 0.6000 mg | PEN_INJECTOR | Freq: Every day | SUBCUTANEOUS | 2 refills | Status: DC

## 2019-10-29 NOTE — Assessment & Plan Note (Signed)
DMII: Hgb A1c 6.3 % at the last visit just 6 days prior.The patient denied polyuria, polydipsia, headache, fatigue, confusion, nausea, vomiting or diaphoresis.  She was unable to pick up her Trulicity as the prior authorization was not yet completed and is now in need of alternative therapy. Average CBG 155, with 19% above, no below and 81% in range. "Metformin intolerance"  Plan: Continue lantus 10U daily Start Victoza 0.6mg  dialy for one week, if no side affects may increase to a therapeutic dose of 1.2mg  daily thereafter and increase again to 1.8mg  if needed Would consider stopping the insulin with increasing victoza dosing Repeat A1c at next visit

## 2019-10-29 NOTE — Telephone Encounter (Signed)
Information was sent to [employer] for PA for Victoza.  Approved 10/29/2019 through 11/28/2019.  [place] [id number].  Sander Nephew, RN  10/29/2019 4:14 PM.

## 2019-10-29 NOTE — Patient Instructions (Signed)
FOLLOW-UP INSTRUCTIONS When: April 14th with your PCP What to bring: All of your medications  I have started you on Victoza 0.6mg  daily. When you return in April your doctor will increase the dose to 1.2mg  and possibly stop your insulin. Please continue to take your blood sugar two time daily.  As always if your symptoms worsen, fail to improve, or you develop other concerning symptoms, please notify our office or visit the local ER if we are unavailable. Symptoms including weakness, confusion, increased urination, increasing thirst, should not be ignored and should encourage you to visit the ED if we are unavailable by phone or the symptoms are severe.  Thank you for your visit to the [facility] today. If you have any questions or concerns please call us at [phone].

## 2019-10-29 NOTE — Telephone Encounter (Signed)
[facility] just called and states they need a PA on victoza, states it is preferred but it still needs a PA
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
## 2019-10-29 NOTE — Progress Notes (Signed)
Internal Medicine Clinic Attending  Case discussed with Dr. Harbrecht at the time of the visit.  We reviewed the resident's history and exam and pertinent patient test results.  I agree with the assessment, diagnosis, and plan of care documented in the resident's note.   

## 2019-11-05 ENCOUNTER — Encounter: Payer: Self-pay | Admitting: *Deleted

## 2019-11-11 ENCOUNTER — Other Ambulatory Visit: Payer: Self-pay | Admitting: *Deleted

## 2019-11-11 DIAGNOSIS — M059 Rheumatoid arthritis with rheumatoid factor, unspecified: Secondary | ICD-10-CM

## 2019-11-11 DIAGNOSIS — Z794 Long term (current) use of insulin: Secondary | ICD-10-CM

## 2019-11-11 DIAGNOSIS — E119 Type 2 diabetes mellitus without complications: Secondary | ICD-10-CM

## 2019-11-11 NOTE — Telephone Encounter (Signed)
Received call from [facility] Pharmacist requesting refill on Aviva Plus test strips. Please change qty to 100. Hubbard Hartshorn, BSN, RN-BC

## 2019-11-11 NOTE — Telephone Encounter (Signed)
etanercept (ENBREL) 50 MG/ML injection   Refill request @ [facility].

## 2019-11-13 ENCOUNTER — Other Ambulatory Visit: Payer: Self-pay | Admitting: Internal Medicine

## 2019-11-13 MED ORDER — ETANERCEPT 50 MG/ML ~~LOC~~ SOSY: 50.0000 mg | PREFILLED_SYRINGE | SUBCUTANEOUS | 5 refills | Status: DC

## 2019-11-13 MED ORDER — ACCU-CHEK AVIVA PLUS VI STRP: 1.0000 | ORAL_STRIP | 6 refills | Status: DC

## 2019-11-13 NOTE — Telephone Encounter (Signed)
Pt is calling regarding test strip [phone]

## 2019-11-20 ENCOUNTER — Other Ambulatory Visit: Payer: Self-pay | Admitting: Internal Medicine

## 2019-11-20 ENCOUNTER — Encounter: Payer: Medicaid Other | Admitting: Internal Medicine

## 2019-11-20 DIAGNOSIS — B9689 Other specified bacterial agents as the cause of diseases classified elsewhere: Secondary | ICD-10-CM

## 2019-11-20 DIAGNOSIS — J329 Chronic sinusitis, unspecified: Secondary | ICD-10-CM

## 2019-11-20 NOTE — Telephone Encounter (Signed)
See Sander Nephew telephone encounter note 3/23.

## 2019-11-27 ENCOUNTER — Other Ambulatory Visit: Payer: Self-pay | Admitting: Internal Medicine

## 2019-11-27 ENCOUNTER — Ambulatory Visit: Payer: Medicaid Other | Admitting: Internal Medicine

## 2019-11-27 ENCOUNTER — Other Ambulatory Visit: Payer: Self-pay

## 2019-11-27 ENCOUNTER — Encounter: Payer: Self-pay | Admitting: Internal Medicine

## 2019-11-27 VITALS — BP 128/78 | HR 82 | Temp 98.6°F | Wt 137.2 lb

## 2019-11-27 DIAGNOSIS — I10 Essential (primary) hypertension: Secondary | ICD-10-CM

## 2019-11-27 DIAGNOSIS — Z79899 Other long term (current) drug therapy: Secondary | ICD-10-CM

## 2019-11-27 DIAGNOSIS — E119 Type 2 diabetes mellitus without complications: Secondary | ICD-10-CM

## 2019-11-27 DIAGNOSIS — Z794 Long term (current) use of insulin: Secondary | ICD-10-CM | POA: Diagnosis not present

## 2019-11-27 DIAGNOSIS — E785 Hyperlipidemia, unspecified: Secondary | ICD-10-CM

## 2019-11-27 LAB — POCT GLYCOSYLATED HEMOGLOBIN (HGB A1C): Hemoglobin A1C: 6.3 % — AB (ref 4.0–5.6)

## 2019-11-27 LAB — GLUCOSE, CAPILLARY: Glucose-Capillary: 96 mg/dL (ref 70–99)

## 2019-11-27 NOTE — Assessment & Plan Note (Signed)
BP Readings from Last 3 Encounters:  11/27/19 128/78  10/29/19 109/88  10/23/19 129/83   Blood pressure controlled with lisinopril 40 mg qd, norvasc 10 mg, and chlorthalidone 25 mg.   - cont current medications

## 2019-11-27 NOTE — Assessment & Plan Note (Signed)
Last a1c 6.3%. Today 6.3. Medications include lantus 10U. She started victoza one month ago. She is not sure if she is taking .6 mg or 1.2 mg. She denies symptoms of hypoglycemia. Glucose has been running 120-160 prior to meals.   History of diagnosed insulin dependence 01/2019 with previous low c-peptide and insulin while hyperglycemic to 360.   - She will call the clinic once she gets home to let us know whether she is taking 6. Or 1.2 mg.  - f/u 3 months for a1c check.  - cont. Pravastatin

## 2019-11-27 NOTE — Patient Instructions (Signed)
Thank you for allowing [place] to provide your care today. Today we discussed your diabetes    I have ordered the following labs for you:    I will call if any are abnormal.    Today we made the following changes to your medications:   Please continue taking your current medications.   Please call the clinic to let us know how much of your Victoza you are taking, .6 mg per day or 1.2 mg?   This will help determine whether we need to continue the lantus or not.   Please follow-up in three months.    Please call the internal medicine center clinic if you have any questions or concerns, we may be able to help and keep you from a long and expensive emergency room wait. Our clinic and after hours phone number is [phone], the best time to call is Monday through Friday 9 am to 4 pm but there is always someone available 24/7 if you have an emergency. If you need medication refills please notify your pharmacy one week in advance and they will send [place] a request.
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
## 2019-11-28 NOTE — Progress Notes (Signed)
Internal Medicine Clinic Attending  Case discussed with Dr. Seawell at the time of the visit.  We reviewed the resident's history and exam and pertinent patient test results.  I agree with the assessment, diagnosis, and plan of care documented in the resident's note.    

## 2019-11-28 NOTE — Addendum Note (Signed)
Addended by: Molli Hazard A on: 11/28/2019 12:30 PM   Modules accepted: Level of Service

## 2019-12-04 ENCOUNTER — Encounter: Payer: Self-pay | Admitting: Internal Medicine

## 2019-12-04 NOTE — Addendum Note (Signed)
Addended by: Hulan Fray on: 12/04/2019 05:36 PM   Modules accepted: Orders

## 2019-12-11 ENCOUNTER — Telehealth: Payer: Self-pay | Admitting: *Deleted

## 2019-12-11 NOTE — Telephone Encounter (Signed)
Information was sent to [employer].  Approved 12/11/2019 thru 12/05/2020.  Sander Nephew, RN 12/11/2019 3:43 PM.

## 2019-12-12 ENCOUNTER — Telehealth: Payer: Self-pay | Admitting: Internal Medicine

## 2019-12-12 NOTE — Telephone Encounter (Signed)
Pt contact pt [phone]

## 2019-12-12 NOTE — Telephone Encounter (Signed)
Pt calls and states she needs to tell dr Sharon Seller that she is taking: 0.6.mg of victoza daily 10units lantus daily cbg results have been 108 to 120

## 2020-01-07 ENCOUNTER — Other Ambulatory Visit: Payer: Self-pay | Admitting: Internal Medicine

## 2020-01-07 DIAGNOSIS — I10 Essential (primary) hypertension: Secondary | ICD-10-CM

## 2020-01-08 ENCOUNTER — Other Ambulatory Visit (HOSPITAL_COMMUNITY): Payer: Self-pay | Admitting: Internal Medicine

## 2020-01-14 ENCOUNTER — Encounter: Payer: Self-pay | Admitting: Gastroenterology

## 2020-01-15 ENCOUNTER — Other Ambulatory Visit: Payer: Self-pay | Admitting: Internal Medicine

## 2020-01-15 DIAGNOSIS — E119 Type 2 diabetes mellitus without complications: Secondary | ICD-10-CM

## 2020-01-16 ENCOUNTER — Other Ambulatory Visit: Payer: Self-pay | Admitting: Internal Medicine

## 2020-01-27 ENCOUNTER — Other Ambulatory Visit: Payer: Self-pay | Admitting: Internal Medicine

## 2020-01-27 DIAGNOSIS — M797 Fibromyalgia: Secondary | ICD-10-CM

## 2020-01-27 DIAGNOSIS — I10 Essential (primary) hypertension: Secondary | ICD-10-CM

## 2020-01-27 DIAGNOSIS — E119 Type 2 diabetes mellitus without complications: Secondary | ICD-10-CM

## 2020-03-03 ENCOUNTER — Ambulatory Visit (AMBULATORY_SURGERY_CENTER): Payer: Self-pay | Admitting: *Deleted

## 2020-03-03 ENCOUNTER — Other Ambulatory Visit: Payer: Self-pay

## 2020-03-03 VITALS — Ht 63.0 in | Wt 135.0 lb

## 2020-03-03 DIAGNOSIS — Z800 Family history of malignant neoplasm of digestive organs: Secondary | ICD-10-CM

## 2020-03-03 MED ORDER — NA SULFATE-K SULFATE-MG SULF 17.5-3.13-1.6 GM/177ML PO SOLN: 1.0000 | Freq: Once | ORAL | 0 refills | Status: AC

## 2020-03-03 NOTE — Progress Notes (Signed)

## 2020-03-15 ENCOUNTER — Encounter: Payer: Self-pay | Admitting: Certified Registered Nurse Anesthetist

## 2020-03-16 ENCOUNTER — Other Ambulatory Visit: Payer: Self-pay

## 2020-03-16 ENCOUNTER — Encounter: Payer: Self-pay | Admitting: Gastroenterology

## 2020-03-16 ENCOUNTER — Ambulatory Visit (AMBULATORY_SURGERY_CENTER): Payer: Medicaid Other | Admitting: Gastroenterology

## 2020-03-16 VITALS — BP 110/46 | HR 60 | Temp 96.4°F | Resp 18 | Ht 63.0 in | Wt 135.0 lb

## 2020-03-16 DIAGNOSIS — D124 Benign neoplasm of descending colon: Secondary | ICD-10-CM

## 2020-03-16 DIAGNOSIS — K621 Rectal polyp: Secondary | ICD-10-CM

## 2020-03-16 DIAGNOSIS — Z800 Family history of malignant neoplasm of digestive organs: Secondary | ICD-10-CM

## 2020-03-16 DIAGNOSIS — Z1211 Encounter for screening for malignant neoplasm of colon: Secondary | ICD-10-CM | POA: Diagnosis not present

## 2020-03-16 DIAGNOSIS — D128 Benign neoplasm of rectum: Secondary | ICD-10-CM

## 2020-03-16 DIAGNOSIS — K635 Polyp of colon: Secondary | ICD-10-CM | POA: Diagnosis not present

## 2020-03-16 MED ORDER — SODIUM CHLORIDE 0.9 % IV SOLN
500.0000 mL | Freq: Once | INTRAVENOUS | Status: DC
Start: 2020-03-16 — End: 2020-03-16

## 2020-03-16 NOTE — Progress Notes (Signed)
Report given to PACU, vss 

## 2020-03-16 NOTE — Op Note (Signed)
[facility] Patient Name: Lorraine [name] Procedure Date: 03/16/2020 10:33 AM MRN: [id number] Endoscopist: Mauri Pole , MD Age: 59 Referring MD:  Date of Birth: 1961-01-31 Gender: Female Account #: [id number] Procedure:                Colonoscopy Indications:              Screening in patient at increased risk: Family                            history of 1st-degree relative with colorectal                            cancer Medicines:                Monitored Anesthesia Care Procedure:                Pre-Anesthesia Assessment:                           - Prior to the procedure, a History and Physical                            was performed, and patient medications and                            allergies were reviewed. The patient's tolerance of                            previous anesthesia was also reviewed. The risks                            and benefits of the procedure and the sedation                            options and risks were discussed with the patient.                            All questions were answered, and informed consent                            was obtained. Prior Anticoagulants: The patient has                            taken no previous anticoagulant or antiplatelet                            agents. ASA Grade Assessment: II - A patient with                            mild systemic disease. After reviewing the risks                            and benefits, the patient was deemed in  satisfactory condition to undergo the procedure.                           After obtaining informed consent, the colonoscope                            was passed under direct vision. Throughout the                            procedure, the patient's blood pressure, pulse, and                            oxygen saturations were monitored continuously. The                            Colonoscope was introduced through the anus and                             advanced to the the cecum, identified by                            appendiceal orifice and ileocecal valve. The                            colonoscopy was performed without difficulty. The                            patient tolerated the procedure well. The quality                            of the bowel preparation was good. The ileocecal                            valve, appendiceal orifice, and rectum were                            photographed. Scope In: 10:37:37 AM Scope Out: 10:52:51 AM Scope Withdrawal Time: 0 hours 11 minutes 20 seconds  Total Procedure Duration: 0 hours 15 minutes 14 seconds  Findings:                 The perianal and digital rectal examinations were                            normal.                           Three sessile polyps were found in the rectum and                            sigmoid colon. The polyps were 1 to 2 mm in size.                            These polyps were removed with a cold biopsy  forceps. Resection and retrieval were complete.                           Non-bleeding internal hemorrhoids were found during                            retroflexion. The hemorrhoids were small. Complications:            No immediate complications. Estimated Blood Loss:     Estimated blood loss was minimal. Impression:               - Three 1 to 2 mm polyps in the rectum and in the                            sigmoid colon, removed with a cold biopsy forceps.                            Resected and retrieved.                           - Non-bleeding internal hemorrhoids. Recommendation:           - Patient has a contact number available for                            emergencies. The signs and symptoms of potential                            delayed complications were discussed with the                            patient. Return to normal activities tomorrow.                            Written discharge instructions  were provided to the                            patient.                           - Resume previous diet.                           - Continue present medications.                           - Await pathology results.                           - Repeat colonoscopy in 5 years for surveillance                            based on pathology results. Mauri Pole, MD 03/16/2020 10:56:56 AM This report has been signed electronically.

## 2020-03-16 NOTE — Progress Notes (Signed)
Called to room to assist during endoscopic procedure.  Patient ID and intended procedure confirmed with present staff. Received instructions for my participation in the procedure from the performing physician.  

## 2020-03-16 NOTE — Progress Notes (Signed)
LR - Check-in CW/LN - VS   Pt's states no medical or surgical changes since previsit or office visit. maw

## 2020-03-16 NOTE — Patient Instructions (Signed)
YOU HAD AN ENDOSCOPIC PROCEDURE TODAY AT THE [facility]:   Refer to the procedure report that was given to you for any specific questions about what was found during the examination.  If the procedure report does not answer your questions, please call your gastroenterologist to clarify.  If you requested that your care partner not be given the details of your procedure findings, then the procedure report has been included in a sealed envelope for you to review at your convenience later.  YOU SHOULD EXPECT: Some feelings of bloating in the abdomen. Passage of more gas than usual.  Walking can help get rid of the air that was put into your GI tract during the procedure and reduce the bloating. If you had a lower endoscopy (such as a colonoscopy or flexible sigmoidoscopy) you may notice spotting of blood in your stool or on the toilet paper. If you underwent a bowel prep for your procedure, you may not have a normal bowel movement for a few days.  **Handout given on polyps**  Please Note:  You might notice some irritation and congestion in your nose or some drainage.  This is from the oxygen used during your procedure.  There is no need for concern and it should clear up in a day or so.  SYMPTOMS TO REPORT IMMEDIATELY:   Following lower endoscopy (colonoscopy or flexible sigmoidoscopy):  Excessive amounts of blood in the stool  Significant tenderness or worsening of abdominal pains  Swelling of the abdomen that is new, acute  Fever of 100F or higher    For urgent or emergent issues, a gastroenterologist can be reached at any hour by calling [phone]. Do not use MyChart messaging for urgent concerns.    DIET:  We do recommend a small meal at first, but then you may proceed to your regular diet.  Drink plenty of fluids but you should avoid alcoholic beverages for 24 hours.  ACTIVITY:  You should plan to take it easy for the rest of today and you should NOT DRIVE or use heavy  machinery until tomorrow (because of the sedation medicines used during the test).    FOLLOW UP: Our staff will call the number listed on your records 48-72 hours following your procedure to check on you and address any questions or concerns that you may have regarding the information given to you following your procedure. If we do not reach you, we will leave a message.  We will attempt to reach you two times.  During this call, we will ask if you have developed any symptoms of COVID 19. If you develop any symptoms (ie: fever, flu-like symptoms, shortness of breath, cough etc.) before then, please call [phone].  If you test positive for Covid 19 in the 2 weeks post procedure, please call and report this information to us.    If any biopsies were taken you will be contacted by phone or by letter within the next 1-3 weeks.  Please call us at [phone] if you have not heard about the biopsies in 3 weeks.    SIGNATURES/CONFIDENTIALITY: You and/or your care partner have signed paperwork which will be entered into your electronic medical record.  These signatures attest to the fact that that the information above on your After Visit Summary has been reviewed and is understood.  Full responsibility of the confidentiality of this discharge information lies with you and/or your care-partner. 

## 2020-03-18 ENCOUNTER — Telehealth: Payer: Self-pay | Admitting: *Deleted

## 2020-03-18 NOTE — Telephone Encounter (Signed)
  Follow up Call-  Call back number 03/16/2020  Post procedure Call Back phone  # [phone] cell  Permission to leave phone message Yes  Some recent data might be hidden     Patient questions:  Do you have a fever, pain , or abdominal swelling? No. Pain Score  0 *  Have you tolerated food without any problems? Yes.    Have you been able to return to your normal activities? Yes.    Do you have any questions about your discharge instructions: Diet   No. Medications  No. Follow up visit  No.  Do you have questions or concerns about your Care? No.  Actions: * If pain score is 4 or above: No action needed, pain <4  1. Have you developed a fever since your procedure? NO  2.   Have you had an respiratory symptoms (SOB or cough) since your procedure? NO  3.   Have you tested positive for COVID 19 since your procedure NO  4.   Have you had any family members/close contacts diagnosed with the COVID 19 since your procedure?  NO  If yes to any of these questions please route to Joylene John, RN and Joella Prince, RN

## 2020-03-20 ENCOUNTER — Ambulatory Visit: Payer: Self-pay | Admitting: *Deleted

## 2020-03-20 NOTE — Telephone Encounter (Signed)
Requesting to know what inulin medication was and to assure it was not for her diabetes. Reviewed medication with patient and educated inulin was a type of fiber medication and not the same as insulin. Patient verbalized understanding. Encouraged patient to contact PCP if she has further questions or concerns prior to taking medication.   Answer Assessment - Initial Assessment Questions 1. NAME of MEDICATION: "What medicine are you calling about?"     inulin 2. QUESTION: "What is your question?" (e.g., medication refill, side effect)     Is this like insulin for diabetes? 3. PRESCRIBING HCP: "Who prescribed it?" Reason: if prescribed by specialist, call should be referred to that group.     na 4. SYMPTOMS: "Do you have any symptoms?"     na 5. SEVERITY: If symptoms are present, ask "Are they mild, moderate or severe?"     na 6. PREGNANCY:  "Is there any chance that you are pregnant?" "When was your last menstrual period?"     na  Protocols used: MEDICATION QUESTION CALL-A-AH

## 2020-03-25 ENCOUNTER — Telehealth: Payer: Self-pay | Admitting: *Deleted

## 2020-03-25 NOTE — Telephone Encounter (Signed)
Pt calls and states she has a rash all over her body She denies : Cough Sneezing Chest pain Shortness of breath Sore throat Body pain H/a Fatigue appt 8/19 at 1515

## 2020-03-26 ENCOUNTER — Other Ambulatory Visit: Payer: Self-pay

## 2020-03-26 ENCOUNTER — Encounter: Payer: Self-pay | Admitting: Internal Medicine

## 2020-03-26 ENCOUNTER — Ambulatory Visit (INDEPENDENT_AMBULATORY_CARE_PROVIDER_SITE_OTHER): Payer: Medicaid Other | Admitting: Internal Medicine

## 2020-03-26 VITALS — BP 110/75 | HR 75 | Temp 98.3°F | Ht 63.0 in | Wt 125.7 lb

## 2020-03-26 DIAGNOSIS — L299 Pruritus, unspecified: Secondary | ICD-10-CM | POA: Diagnosis not present

## 2020-03-26 DIAGNOSIS — I10 Essential (primary) hypertension: Secondary | ICD-10-CM

## 2020-03-26 DIAGNOSIS — E119 Type 2 diabetes mellitus without complications: Secondary | ICD-10-CM

## 2020-03-26 MED ORDER — HYDROXYZINE HCL 25 MG PO TABS: 25.0000 mg | ORAL_TABLET | Freq: Four times a day (QID) | ORAL | 0 refills | Status: DC | PRN

## 2020-03-26 NOTE — Patient Instructions (Addendum)
Lorraine [name],  It was a pleasure to see you today. Thank you for coming in.   Today we discussed your generalized itchiness and rash.  This looks like it could be eczema.  Please start using the hypoallergenic soaps, such as dove soap.  Please start using hypoallergenic lotaions, such as Eucerin cream, make sure that you stay well moisturized.  You can use hypoallergenic shampoo such as Free and Clear or Cetaphil baby shampoo.  You can start using Hydoxyzine as needed for the itchiness.   Please keep a diary of what you are doing, what you are eating, and any changes throughout the day when your symptoms are worse.   Please return to clinic in 1 month or sooner if needed.   Thank you again for coming in.   [name].D.
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
## 2020-03-27 ENCOUNTER — Encounter: Payer: Self-pay | Admitting: Gastroenterology

## 2020-03-27 NOTE — Assessment & Plan Note (Signed)
Patient reports that she woke up on Sunday morning, about 4 days ago, with a rash that appeared all over her body and generalized itchiness.  She reports that it is on her arms, legs, back, head, abdomen, feet, hands, chest, that no place was spared.  She reports that she tried everything, which included taking a hot shower and calamine lotion, which she reports did not help.  She also reports 2 lesions that occurred on her groin area, she reports they were very painful, and red, and states that they burst.  She denies being sexually active, never had this before.  She denied doing anything new on Saturday.  Denied any changes in her medications, animal exposure, changes in her household items such as her soap or laundry detergent.  She has not been spending time outdoors.  She denies any fevers, chills, nausea, vomiting, shortness of breath, chest pain, headaches, lightheadedness, dizziness, fatigue, weakness, or any other symptoms. On exam diagnosed significant rash that was noted, she does have some mildly dry skin in some whitish hue on her right lower extremities as well as some mild erythema her bilateral hands. There were 2 distinct plaques were noted on the groin area, no pain to palpation, with hair follicles growing from them. Dry reviewed appears that she has had a similar episode in 2018, was prescribed clotrimazole cream and advised to continue Eucerin cream.  Other than the generalized itchiness it is no other systemic symptoms noted.  No obvious rash was noted however did have generalized dry skin possible whitish hue, could be consistent with eczema given the generalized itchiness.  Discussed treating symptomatically with hypoallergenic soaps, shampoos, creams.  Discussed need to keep a diary to monitor when her symptoms are worse and what she is doing, what products she is using, and what medication she is taking on that time.  Discussed treatment with hydroxyzine for the generalized itchiness.  2  plaques noted on the groin area could be consistent with folliculitis, no erythema, pain, drainage noted at this time.   -Hydroxyzine as needed -Provided information on hypoallergenic products, recommended Dove soap, Eucerin cream, and free and clear or set up for baby shampoo -Advised to keep a diary of possible exposures

## 2020-03-27 NOTE — Progress Notes (Signed)
Internal Medicine Clinic Attending  Case discussed with Dr. Basaraba  At the time of the visit.  We reviewed the resident's history and exam and pertinent patient test results.  I agree with the assessment, diagnosis, and plan of care documented in the resident's note.  

## 2020-04-08 ENCOUNTER — Encounter: Payer: Medicaid Other | Admitting: Internal Medicine

## 2020-04-14 ENCOUNTER — Telehealth: Payer: Self-pay | Admitting: *Deleted

## 2020-04-14 NOTE — Telephone Encounter (Addendum)
Information was sent through CoverMyMeds for PA for Enbel.  Awaiting determination within 24 hours. Sander Nephew, RN 04/14/2020 11:59 AM.  Joylene Igo from [facility] PA for Enbrel was approved 04/09/2020 thru 04/14/2021. Patient is able to fill up to a 34 day supply at a retail pharmacy and 90 days supply for maintenance drugs.   Sander Nephew, RN 04/14/2020 2:59 PM.

## 2020-04-20 ENCOUNTER — Encounter: Payer: Self-pay | Admitting: Internal Medicine

## 2020-04-20 ENCOUNTER — Ambulatory Visit (INDEPENDENT_AMBULATORY_CARE_PROVIDER_SITE_OTHER): Payer: Medicaid Other | Admitting: Internal Medicine

## 2020-04-20 ENCOUNTER — Other Ambulatory Visit: Payer: Self-pay

## 2020-04-20 ENCOUNTER — Other Ambulatory Visit: Payer: Self-pay | Admitting: Internal Medicine

## 2020-04-20 ENCOUNTER — Other Ambulatory Visit: Payer: Self-pay | Admitting: Student

## 2020-04-20 VITALS — BP 122/77 | HR 81 | Temp 97.9°F | Wt 128.2 lb

## 2020-04-20 DIAGNOSIS — Z0001 Encounter for general adult medical examination with abnormal findings: Secondary | ICD-10-CM | POA: Diagnosis not present

## 2020-04-20 DIAGNOSIS — Z23 Encounter for immunization: Secondary | ICD-10-CM | POA: Diagnosis not present

## 2020-04-20 DIAGNOSIS — R300 Dysuria: Secondary | ICD-10-CM

## 2020-04-20 DIAGNOSIS — L299 Pruritus, unspecified: Secondary | ICD-10-CM

## 2020-04-20 DIAGNOSIS — E119 Type 2 diabetes mellitus without complications: Secondary | ICD-10-CM

## 2020-04-20 DIAGNOSIS — Z794 Long term (current) use of insulin: Secondary | ICD-10-CM | POA: Diagnosis not present

## 2020-04-20 DIAGNOSIS — Z0000 Encounter for general adult medical examination without abnormal findings: Secondary | ICD-10-CM

## 2020-04-20 LAB — POCT GLYCOSYLATED HEMOGLOBIN (HGB A1C): Hemoglobin A1C: 5.9 % — AB (ref 4.0–5.6)

## 2020-04-20 LAB — POCT URINALYSIS DIPSTICK
Bilirubin, UA: NEGATIVE
Blood, UA: NEGATIVE
Glucose, UA: NEGATIVE
Ketones, UA: NEGATIVE
Leukocytes, UA: NEGATIVE
Nitrite, UA: NEGATIVE
Protein, UA: NEGATIVE
Spec Grav, UA: 1.02 (ref 1.010–1.025)
Urobilinogen, UA: 0.2 E.U./dL
pH, UA: 6.5 (ref 5.0–8.0)

## 2020-04-20 LAB — GLUCOSE, CAPILLARY: Glucose-Capillary: 127 mg/dL — ABNORMAL HIGH (ref 70–99)

## 2020-04-20 MED ORDER — CLOTRIMAZOLE-BETAMETHASONE 1-0.05 % EX CREA: TOPICAL_CREAM | CUTANEOUS | 0 refills | Status: DC

## 2020-04-20 MED ORDER — KETOCONAZOLE 2 % EX SHAM: 1.0000 "application " | MEDICATED_SHAMPOO | CUTANEOUS | 0 refills | Status: AC

## 2020-04-20 NOTE — Assessment & Plan Note (Signed)
See under preventative health care A/P

## 2020-04-20 NOTE — Assessment & Plan Note (Signed)
Presents w/ complaint of dysuria. Previously had candidal vaginal infection. Treated w/ monistat in the past. Denies any urgency, frequency. States feels like previous yeast infection.   A/P Urine dipstick negative. Denied GU exam this visit but advised to c/w OTC monistat as needed.

## 2020-04-20 NOTE — Assessment & Plan Note (Addendum)
Ms.Paules is a 59 yo F w/ PMH of RA, IDDM, HTN, HLD presenting with generalized pruritus. She was seen by Dr.Krienke previously for similar symptoms 3 weeks ago and had been prescribed hydroxyzine with lifestyle modifications to avoid triggers for presumed allergies as the cause. Since then, she has had no improvement in her symptoms despite hydroxyzine and changing her bedding, clothes and soap. She mentions continued continuous generalized pruritus of her scalp, torso, arms and legs. She mentions feeling frustrated with lack of improvement and continued worsening severity of her itching causing her to have poor sleep and worsening quality of life. She continues to deny any systemic symptoms such as fevers, chills, nausea, vomiting. Denies any new exposures, medicines, or vaccinations.   A/P Presenting w/ continued complaint of generalized pruritus. Exam not very significant except for small patches of hypopigmentation. Differential includes eczema vs tinea versicolor vs uremia vs cholestatic pruritus although physical exam and hx not consistent. Chart review shows improvement w/ clotrimazole cream in 2018. Reasonable to empirically treat for versicolor as presentation is similar. Will also get blood work to r/o alternative diagnosis,  - Clomitrazole cream, ketoconazole shampoo - CMP, CBC - F/u in 4 weeks to assess for effect. May need oral anti-fungal if topical tx fails

## 2020-04-20 NOTE — Assessment & Plan Note (Signed)
Agreeable to receive flu shot this visit. COVID vaccination record updated in chart.
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
## 2020-04-20 NOTE — Assessment & Plan Note (Addendum)
Lab Results  Component Value Date   HGBA1C 5.9 (A) 04/20/2020   Improved from 6.3. Denies any hypoglycemic event. Personal glucometer reviewed. #42 readings in 4 weeks w/ average bg of 129 w/o high or low episodes. Currently on victoza 0.6mg  (confirmed w/ pt), lantus 10 units.   A/P Advised on current therapy. Congratulated her on her excellent glycemic control.  - C/w victoza 0.6mg  daily, lantus 10 units daily - C/w glucose monitoring - F/u in 3 months - lipid profile today to assess for need to up-titrate cholesterol meds (currently on pravastatin)

## 2020-04-20 NOTE — Patient Instructions (Addendum)
Thank you for allowing [place] to provide your care today. Today we discussed your itching and burning with urine    I have ordered cbc, cmp labs for you. I will call if any are abnormal.    Today we made the following changes to your medications.    Please start ketoconazole shampoo twice daily and clotrimazole cream cream twice daily for 4 weeks  Please follow-up in 1 month.    Should you have any questions or concerns please call the internal medicine clinic at [phone].     Tinea Versicolor  Tinea versicolor is a skin infection. It is caused by a type of yeast. It is normal for some yeast to be on your skin, but too much yeast causes this infection. The infection causes a rash of light or dark patches on your skin. The rash is most common on the chest, back, neck, or upper arms. The infection usually does not cause other problems. If it is treated, it will probably go away in a few weeks. The infection cannot be spread from one person to another (is not contagious). Follow these instructions at home:  Use over-the-counter and prescription medicines only as told by your doctor.  Scrub your skin every day with dandruff shampoo as told by your doctor.  Do not scratch your skin in the rash area.  Avoid places that are hot and humid.  Do not use tanning booths.  Try to avoid sweating a lot. Contact a doctor if:  Your symptoms get worse.  You have a fever.  You have redness, swelling, or pain in the rash area.  You have fluid or blood coming from your rash.  Your rash feels warm to the touch.  You have pus or a bad smell coming from your rash.  Your rash comes back (recurs) after treatment. Summary  Tinea versicolor is a skin infection. It causes a rash of light or dark patches on your skin.  The rash is most common on the chest, back, neck, or upper arms. This infection usually does not cause other problems.  Use over-the-counter and prescription medicines only as  told by your doctor.  If the infection is treated, it will probably go away in a few weeks. This information is not intended to replace advice given to you by your health care provider. Make sure you discuss any questions you have with your health care provider. Document Revised: 07/07/2017 Document Reviewed: 03/27/2017 Elsevier Patient Education  2020 [employer].

## 2020-04-21 LAB — CBC WITH DIFFERENTIAL/PLATELET
Basophils Absolute: 0 10*3/uL (ref 0.0–0.2)
Basos: 1 %
EOS (ABSOLUTE): 0.1 10*3/uL (ref 0.0–0.4)
Eos: 1 %
Hematocrit: 41.7 % (ref 34.0–46.6)
Hemoglobin: 14 g/dL (ref 11.1–15.9)
Immature Grans (Abs): 0 10*3/uL (ref 0.0–0.1)
Immature Granulocytes: 0 %
Lymphocytes Absolute: 2.3 10*3/uL (ref 0.7–3.1)
Lymphs: 37 %
MCH: 30.4 pg (ref 26.6–33.0)
MCHC: 33.6 g/dL (ref 31.5–35.7)
MCV: 91 fL (ref 79–97)
Monocytes Absolute: 0.6 10*3/uL (ref 0.1–0.9)
Monocytes: 9 %
Neutrophils Absolute: 3.1 10*3/uL (ref 1.4–7.0)
Neutrophils: 52 %
Platelets: 360 10*3/uL (ref 150–450)
RBC: 4.6 x10E6/uL (ref 3.77–5.28)
RDW: 13.1 % (ref 11.7–15.4)
WBC: 6.1 10*3/uL (ref 3.4–10.8)

## 2020-04-21 LAB — LIPID PANEL
Chol/HDL Ratio: 2.6 ratio (ref 0.0–4.4)
Cholesterol, Total: 148 mg/dL (ref 100–199)
HDL: 56 mg/dL (ref >39)
LDL Chol Calc (NIH): 72 mg/dL (ref 0–99)
Triglycerides: 113 mg/dL (ref 0–149)
VLDL Cholesterol Cal: 20 mg/dL (ref 5–40)

## 2020-04-21 LAB — CMP14 + ANION GAP
ALT: 14 IU/L (ref 0–32)
AST: 139 IU/L — ABNORMAL HIGH (ref 0–40)
Albumin/Globulin Ratio: 1.3 (ref 1.2–2.2)
Albumin: 4.3 g/dL (ref 3.8–4.8)
Alkaline Phosphatase: 76 IU/L (ref 44–121)
Anion Gap: 17 mmol/L (ref 10.0–18.0)
BUN/Creatinine Ratio: 15 (ref 12–28)
BUN: 11 mg/dL (ref 8–27)
Bilirubin Total: <0.2 mg/dL (ref 0.0–1.2)
CO2: 22 mmol/L (ref 20–29)
Calcium: 9.5 mg/dL (ref 8.7–10.3)
Chloride: 93 mmol/L — ABNORMAL LOW (ref 96–106)
Creatinine, Ser: 0.73 mg/dL (ref 0.57–1.00)
GFR calc Af Amer: 103 mL/min/{1.73_m2} (ref >59)
GFR calc non Af Amer: 89 mL/min/{1.73_m2} (ref >59)
Globulin, Total: 3.3 g/dL (ref 1.5–4.5)
Glucose: 106 mg/dL — ABNORMAL HIGH (ref 65–99)
Potassium: 3.5 mmol/L (ref 3.5–5.2)
Sodium: 132 mmol/L — ABNORMAL LOW (ref 134–144)
Total Protein: 7.6 g/dL (ref 6.0–8.5)

## 2020-04-21 NOTE — Progress Notes (Signed)
Internal Medicine Clinic Attending  Case discussed with Dr. Lee at the time of the visit.  We reviewed the resident's history and exam and pertinent patient test results.  I agree with the assessment, diagnosis, and plan of care documented in the resident's note.  Deliliah Spranger, M.D., Ph.D.  

## 2020-04-28 ENCOUNTER — Other Ambulatory Visit: Payer: Self-pay | Admitting: Internal Medicine

## 2020-04-28 DIAGNOSIS — I10 Essential (primary) hypertension: Secondary | ICD-10-CM

## 2020-05-11 ENCOUNTER — Telehealth: Payer: Self-pay | Admitting: Internal Medicine

## 2020-05-11 NOTE — Telephone Encounter (Signed)
Discussed with [name] regarding her itching. She mentions lack of improvement with topical anti-fungals. Also discussed her normal lab results and whether her symptoms could be attributable to environmental factors. [name] expressed understanding and states she will change all her bedding and soaps to assess for effect. All other questions and concerns addressed.

## 2020-05-12 ENCOUNTER — Encounter: Payer: Self-pay | Admitting: Student

## 2020-05-12 ENCOUNTER — Other Ambulatory Visit: Payer: Self-pay

## 2020-05-12 ENCOUNTER — Ambulatory Visit (INDEPENDENT_AMBULATORY_CARE_PROVIDER_SITE_OTHER): Payer: Medicaid Other | Admitting: Student

## 2020-05-12 VITALS — BP 121/85 | HR 67 | Temp 98.4°F | Wt 128.2 lb

## 2020-05-12 DIAGNOSIS — I10 Essential (primary) hypertension: Secondary | ICD-10-CM | POA: Diagnosis not present

## 2020-05-12 DIAGNOSIS — L299 Pruritus, unspecified: Secondary | ICD-10-CM | POA: Diagnosis not present

## 2020-05-12 NOTE — Assessment & Plan Note (Signed)
BP is stable at 121/85 today. Continue current antihypertensive regimen.
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
## 2020-05-12 NOTE — Assessment & Plan Note (Signed)
Patient here again with generalized pruritus. States she has followed the instructions from her last visit but has not noticed any improvement. She continues to itch on her scalp, torso, arms and legs. Denies any systemic symptoms such as N/V, fever, chills, abdominal pain and no new exposures. The OTC benadryl has not help. Exams shows hypopigmented areas on arms and thighs but no obvious rash. Could be cholestatic pruritus but LFTs are not significantly elevated from recent CMP. Can consider tinea versicolor or eczema but will refer to dermatology for further evaluation and management.   Plan: --Continue hydroxyzine as needed for itching --OTC anti-itching cream as needed for itching --Dermatology referral

## 2020-05-12 NOTE — Patient Instructions (Addendum)
Thank you, Ms.Maelani C Buege for allowing [place] to provide your care today. Today we discussed your rash. We do not know what is causing your rash so we will refer you to a dermatologist. Continue using the prescribed cream and take OTC hydroxyzine or benadryl as needed for the itching.  I have place a referrals to dermatologist. They will call you to make an appointment.   Please follow-up in as needed  Should you have any questions or concerns please call the internal medicine clinic at [phone].    Linwood Dibbles, MD, MPH [facility]   My Chart Access: [email or web address]   If you have not already done so, please get your COVID 19 vaccine  To schedule an appointment for a COVID vaccine choice any of the following: Go to [email or web address]   Go to [email or web address]                  Call [phone]                                     Call [phone] and select Option 2

## 2020-05-14 NOTE — Progress Notes (Signed)
DOS 05/12/20:  Internal Medicine Clinic Attending  I saw and evaluated the patient.  I personally confirmed the key portions of the history and exam documented by Dr. Coy Saunas and I reviewed pertinent patient test results.  The assessment, diagnosis, and plan were formulated together and I agree with the documentation in the resident's note.

## 2020-05-18 ENCOUNTER — Telehealth: Payer: Self-pay | Admitting: Internal Medicine

## 2020-05-18 NOTE — Telephone Encounter (Signed)
Pls contact pt regarding her referral [phone]

## 2020-05-18 NOTE — Telephone Encounter (Signed)
Called and spoke with the patient.  Pt is aware now that [facility] will be contacting her to get her sch.   Records to be faxed to [facility].

## 2020-05-22 ENCOUNTER — Other Ambulatory Visit: Payer: Self-pay | Admitting: Internal Medicine

## 2020-05-22 DIAGNOSIS — L299 Pruritus, unspecified: Secondary | ICD-10-CM

## 2020-05-22 NOTE — Telephone Encounter (Signed)
RTC, States she has an appt with Dermatology in January.  Still c/o itching, but states the cream and shampoo is helping some.  She would like refills until she see's dermatology.  Per chart review, Ketocanazole shampoo was prescribed, but I do not see it on the active med list. Will forward to red team. Laurence Compton, RN,BSN

## 2020-05-22 NOTE — Telephone Encounter (Signed)
Pt calling to report her Referral Appt for the Dermatologist is not until 08/15/2019 @ 10:45am.  PT is Requesting something for her rash because it is itching really bad.  Please call the patient back.

## 2020-05-25 ENCOUNTER — Other Ambulatory Visit: Payer: Self-pay | Admitting: Student

## 2020-05-25 DIAGNOSIS — L299 Pruritus, unspecified: Secondary | ICD-10-CM

## 2020-05-25 MED ORDER — KETOCONAZOLE 2 % EX SHAM: 1.0000 "application " | MEDICATED_SHAMPOO | CUTANEOUS | 0 refills | Status: DC

## 2020-05-25 MED ORDER — CLOTRIMAZOLE-BETAMETHASONE 1-0.05 % EX CREA: TOPICAL_CREAM | CUTANEOUS | 0 refills | Status: DC

## 2020-05-25 NOTE — Telephone Encounter (Signed)
Called patient and refilled shampoo and cream

## 2020-05-25 NOTE — Progress Notes (Signed)
Patient was called to assess improvement in pruritus after requesting refill of Lotrisone cream. Patient states the itching has improved with the cream but she does not have an appointment until January. She also requests refill of ketoconazole shampoo.  Patient also thinks she has a yeast infection and would like some medication.  Patient was advised to try OTC Monistat which she has had in the past to treat Candida infection. She was advised to schedule follow-up appointment if no improvement.  Plan: --Refilled Lotrisone cream --Refilled ketoconazole shampoo --Advised to take OTC Monistat --Advised to call for follow-up with no improvement

## 2020-06-03 ENCOUNTER — Ambulatory Visit: Payer: Medicaid Other | Attending: Internal Medicine

## 2020-06-03 ENCOUNTER — Other Ambulatory Visit (HOSPITAL_COMMUNITY): Payer: Self-pay | Admitting: Internal Medicine

## 2020-06-03 DIAGNOSIS — Z23 Encounter for immunization: Secondary | ICD-10-CM

## 2020-06-03 NOTE — Progress Notes (Signed)
   Covid-19 Vaccination Clinic  Name:  HAZLEY DEZEEUW    MRN: [id number] DOB: 25-Aug-1960  06/03/2020  Ms. Denis was observed post Covid-19 immunization for 15 minutes without incident. She was provided with Vaccine Information Sheet and instruction to access the V-Safe system.   Ms. Hovland was instructed to call 911 with any severe reactions post vaccine: Marland Kitchen Difficulty breathing  . Swelling of face and throat  . A fast heartbeat  . A bad rash all over body  . Dizziness and weakness

## 2020-06-09 IMAGING — DX DG CHEST 2V
2 series · 2 of 2 positions shown · non-contrast
Comparison: Chest x-ray of 06/23/2017

CLINICAL DATA: History of prior tuberculous infection, smoking
history, diabetes, hypertension

EXAM:
CHEST - 2 VIEW

[chest pa]
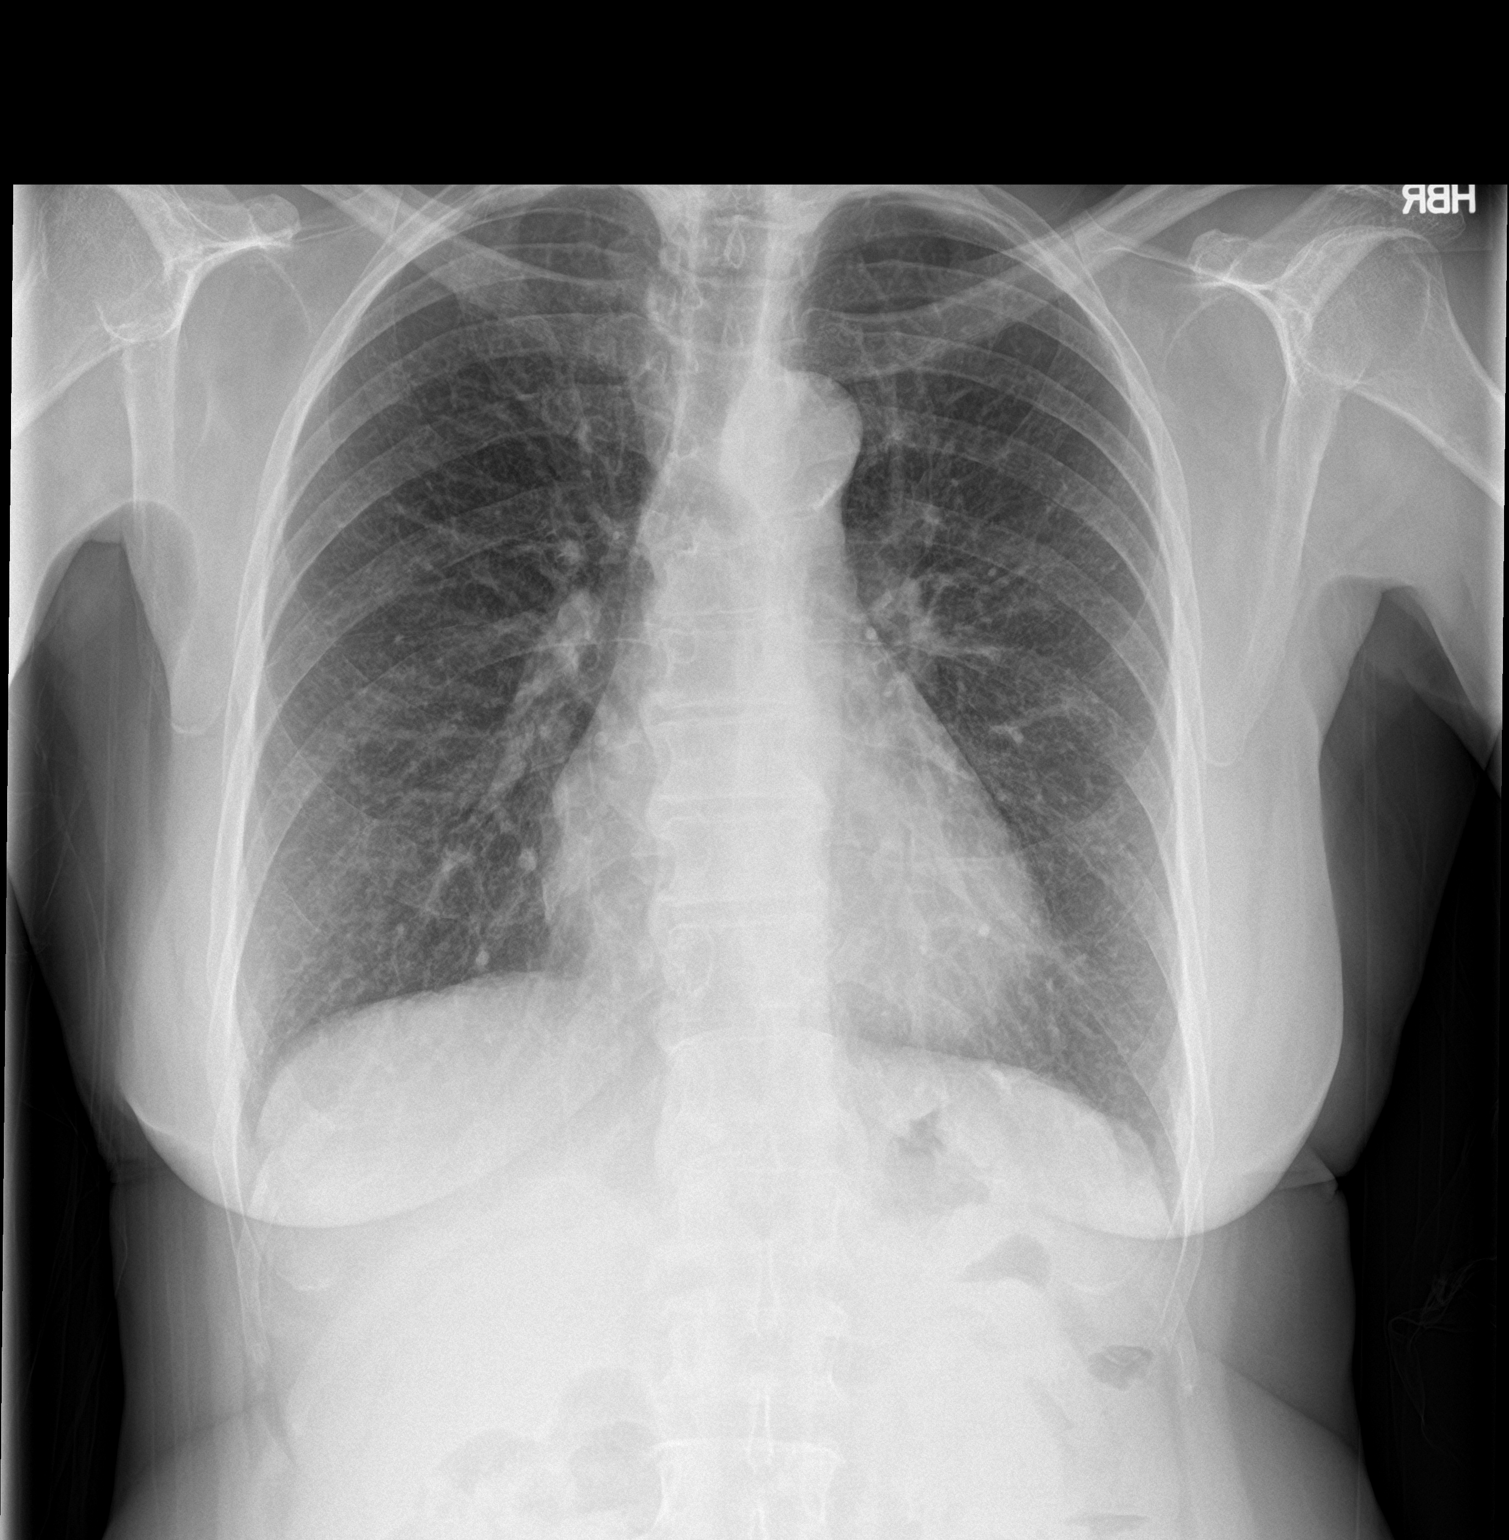

[chest lat]
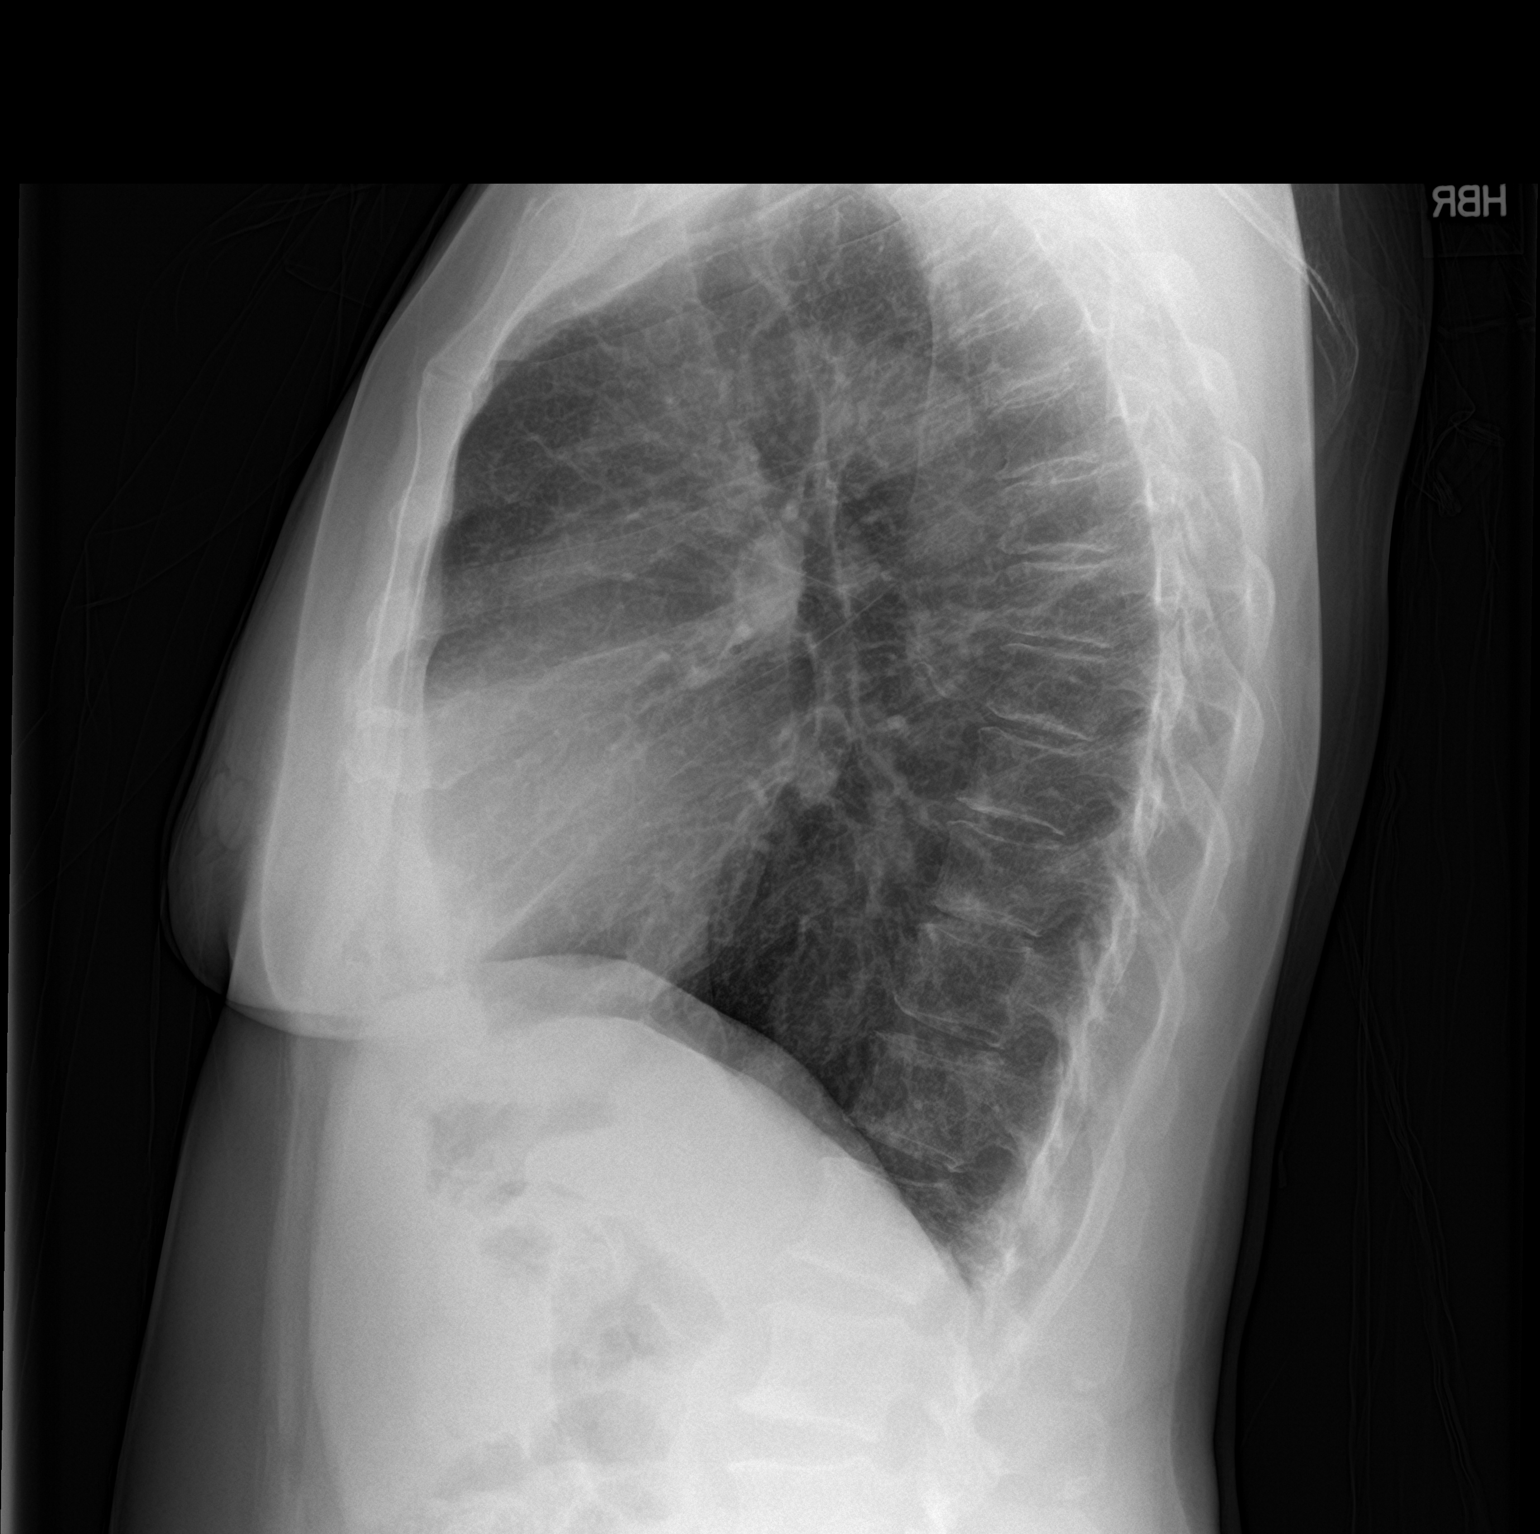

[2 of 2 positions shown; findings below may reference images not displayed]

FINDINGS: No active infiltrate or effusion is seen. The lungs remain slightly
hyperaerated. Mediastinal and hilar contours are unremarkable. The
heart is within upper limits of normal. No acute bony abnormality is
seen.
IMPRESSION: No change in slight hyper aeration.  No active lung disease.

## 2020-06-12 ENCOUNTER — Other Ambulatory Visit: Payer: Self-pay | Admitting: Obstetrics & Gynecology

## 2020-06-12 DIAGNOSIS — Z1231 Encounter for screening mammogram for malignant neoplasm of breast: Secondary | ICD-10-CM

## 2020-07-07 ENCOUNTER — Other Ambulatory Visit: Payer: Self-pay

## 2020-07-07 ENCOUNTER — Other Ambulatory Visit: Payer: Self-pay | Admitting: Internal Medicine

## 2020-07-07 DIAGNOSIS — L299 Pruritus, unspecified: Secondary | ICD-10-CM

## 2020-07-07 MED ORDER — CLOTRIMAZOLE-BETAMETHASONE 1-0.05 % EX CREA: TOPICAL_CREAM | CUTANEOUS | 0 refills | Status: DC

## 2020-07-07 NOTE — Telephone Encounter (Signed)
clotrimazole-betamethasone (LOTRISONE) cream, refill request @  [facility] at [place], [address] Phone:  [phone]  Fax:  [phone]

## 2020-07-22 ENCOUNTER — Other Ambulatory Visit: Payer: Self-pay

## 2020-07-22 ENCOUNTER — Ambulatory Visit
Admission: RE | Admit: 2020-07-22 | Discharge: 2020-07-22 | Disposition: A | Discharge status: Home / Self Care | Payer: Medicaid Other | Source: Ambulatory Visit | Attending: Obstetrics & Gynecology | Admitting: Obstetrics & Gynecology

## 2020-07-22 DIAGNOSIS — Z1231 Encounter for screening mammogram for malignant neoplasm of breast: Secondary | ICD-10-CM

## 2020-08-07 ENCOUNTER — Encounter (HOSPITAL_COMMUNITY): Payer: Self-pay

## 2020-08-07 ENCOUNTER — Ambulatory Visit (HOSPITAL_COMMUNITY)
Admission: EM | Admit: 2020-08-07 | Discharge: 2020-08-07 | Disposition: A | Discharge status: Home / Self Care | Payer: Medicaid Other | Attending: Emergency Medicine | Admitting: Emergency Medicine

## 2020-08-07 ENCOUNTER — Other Ambulatory Visit (HOSPITAL_COMMUNITY): Payer: Self-pay | Admitting: Emergency Medicine

## 2020-08-07 ENCOUNTER — Other Ambulatory Visit: Payer: Self-pay

## 2020-08-07 DIAGNOSIS — B349 Viral infection, unspecified: Secondary | ICD-10-CM | POA: Insufficient documentation

## 2020-08-07 DIAGNOSIS — Z20822 Contact with and (suspected) exposure to covid-19: Secondary | ICD-10-CM | POA: Insufficient documentation

## 2020-08-07 LAB — RESP PANEL BY RT-PCR (FLU A&B, COVID) ARPGX2
Influenza A by PCR: NEGATIVE
Influenza B by PCR: NEGATIVE
SARS Coronavirus 2 by RT PCR: NEGATIVE

## 2020-08-07 MED ORDER — FLUTICASONE PROPIONATE 50 MCG/ACT NA SUSP: 2.0000 | Freq: Every day | NASAL | 0 refills | Status: DC
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
## 2020-08-07 NOTE — ED Notes (Signed)
Pt also c/o pain to posterior left thigh acute onset yesterday upon waking. Denies CP, SOB

## 2020-08-07 NOTE — ED Triage Notes (Signed)
Pt presents for covid testing after covid exposure; pt states she has had some congestion X 2 days.

## 2020-08-07 NOTE — Discharge Instructions (Addendum)
Somebody will contact you if your Covid or flu come back positive.  I will call in a prescription Tamiflu for flu is positive.  I will contact the monoclonal body infusion team if your Covid is positive so they can screen you for an infusion.  It may take 24 to 48 hours for them to get to you.  In the meantime, saline nasal irrigation with a Lloyd Huger med rinse and distilled water as often as you want, Flonase, regular Mucinex.  Ibuprofen 400 mg 3-4 times a day as needed for headache.  If your Covid is positive, get a pulse oximeter monitor your oxygen levels.  Go to the ER if is consistently below 90%.  Also try to get out and walk every day to keep your lungs open and prevent blood clots

## 2020-08-08 HISTORY — PX: COLONOSCOPY: SHX174

## 2020-08-14 ENCOUNTER — Other Ambulatory Visit (HOSPITAL_COMMUNITY): Payer: Self-pay | Admitting: Dermatology

## 2020-08-20 ENCOUNTER — Telehealth: Payer: Self-pay

## 2020-08-20 NOTE — Telephone Encounter (Signed)
Need refill itching cream ;pt contact [phone]   [facility] at [facility], [place]

## 2020-09-09 ENCOUNTER — Other Ambulatory Visit: Payer: Self-pay | Admitting: Internal Medicine

## 2020-09-09 ENCOUNTER — Other Ambulatory Visit: Payer: Self-pay | Admitting: Student

## 2020-09-09 DIAGNOSIS — L299 Pruritus, unspecified: Secondary | ICD-10-CM

## 2020-09-11 ENCOUNTER — Other Ambulatory Visit: Payer: Self-pay | Admitting: Student

## 2020-09-11 DIAGNOSIS — L299 Pruritus, unspecified: Secondary | ICD-10-CM

## 2020-09-18 ENCOUNTER — Other Ambulatory Visit (HOSPITAL_COMMUNITY): Payer: Self-pay | Admitting: Dermatology

## 2020-09-22 ENCOUNTER — Telehealth: Payer: Self-pay

## 2020-09-22 NOTE — Telephone Encounter (Signed)
Pt is wanted a referral to go have an allergy test; pls contact pt [phone]

## 2020-09-22 NOTE — Telephone Encounter (Signed)
Agree with plan to contact podiatrist to see if she can be seen sooner than April for recurrence of her toe complaint.

## 2020-09-22 NOTE — Telephone Encounter (Signed)
Lorraine [name] calls to ask about cysts on the bottom of her toe that keep coming back. She says she saw a foot doctor([facility])  on 09/10/20 and he plans to cut it out in April. She is concerned that it is coming back and she has to keep getting it cut out. She says it is Not red, nothing coming out of it, it is a knot. It bothers her a bit when walking which she says she does to help control her diabetes and be healthy. I encouraged her to  call her foot doctor and ask him her questions and share her concern. We discussed that her diabetes is well controlled and  she can bee seen here for it but we may refer her to the foot doctor. She verbalized understanding

## 2020-09-29 ENCOUNTER — Other Ambulatory Visit: Payer: Self-pay | Admitting: Student

## 2020-09-29 ENCOUNTER — Other Ambulatory Visit: Payer: Self-pay | Admitting: Internal Medicine

## 2020-09-29 DIAGNOSIS — Z794 Long term (current) use of insulin: Secondary | ICD-10-CM

## 2020-09-29 DIAGNOSIS — I10 Essential (primary) hypertension: Secondary | ICD-10-CM

## 2020-09-29 DIAGNOSIS — E785 Hyperlipidemia, unspecified: Secondary | ICD-10-CM

## 2020-09-29 DIAGNOSIS — M059 Rheumatoid arthritis with rheumatoid factor, unspecified: Secondary | ICD-10-CM

## 2020-09-29 DIAGNOSIS — E119 Type 2 diabetes mellitus without complications: Secondary | ICD-10-CM

## 2020-09-29 DIAGNOSIS — M797 Fibromyalgia: Secondary | ICD-10-CM

## 2020-09-29 MED ORDER — PREGABALIN 100 MG PO CAPS: 100.0000 mg | ORAL_CAPSULE | Freq: Three times a day (TID) | ORAL | 2 refills | Status: DC

## 2020-09-29 MED ORDER — ENBREL 50 MG/ML ~~LOC~~ SOSY
PREFILLED_SYRINGE | SUBCUTANEOUS | 0 refills | Status: DC
Start: 2020-09-29 — End: 2020-09-29

## 2020-09-29 MED ORDER — LANTUS SOLOSTAR 100 UNIT/ML ~~LOC~~ SOPN
10.0000 [IU] | PEN_INJECTOR | Freq: Every day | SUBCUTANEOUS | 1 refills | Status: DC
Start: 2020-09-29 — End: 2020-10-09

## 2020-09-29 MED ORDER — VICTOZA 18 MG/3ML ~~LOC~~ SOPN
PEN_INJECTOR | SUBCUTANEOUS | 1 refills | Status: DC
Start: 2020-09-29 — End: 2020-09-29

## 2020-09-29 MED ORDER — CHLORTHALIDONE 25 MG PO TABS: 25.0000 mg | ORAL_TABLET | Freq: Every day | ORAL | 1 refills | Status: DC

## 2020-09-29 MED ORDER — LISINOPRIL 40 MG PO TABS: 40.0000 mg | ORAL_TABLET | Freq: Every day | ORAL | 1 refills | Status: DC

## 2020-09-29 MED ORDER — PRAVASTATIN SODIUM 40 MG PO TABS: 40.0000 mg | ORAL_TABLET | Freq: Every day | ORAL | 1 refills | Status: DC

## 2020-09-29 NOTE — Telephone Encounter (Signed)
It looks like her Rheumatoid arthritits has not been specifically addressed since late 2020.  She was seeing [facility] rheumatology but I do not see recent visits.  I changed enbrel to 0 refills. We will need to have her in to discuss RA if she wants it filled here.

## 2020-09-29 NOTE — Addendum Note (Signed)
Addended by: Velora Heckler on: 09/29/2020 04:01 PM   Modules accepted: Orders

## 2020-09-29 NOTE — Addendum Note (Signed)
Addended by: Velora Heckler on: 09/29/2020 04:31 PM   Modules accepted: Orders

## 2020-09-29 NOTE — Telephone Encounter (Signed)
All meds sent today will need to be resent by Attending as Dr. Allyson Sabal is not yet recognized by [facility].

## 2020-10-09 ENCOUNTER — Encounter: Payer: Self-pay | Admitting: Internal Medicine

## 2020-10-09 ENCOUNTER — Ambulatory Visit (INDEPENDENT_AMBULATORY_CARE_PROVIDER_SITE_OTHER): Payer: Medicaid Other | Admitting: Internal Medicine

## 2020-10-09 ENCOUNTER — Other Ambulatory Visit: Payer: Self-pay | Admitting: Internal Medicine

## 2020-10-09 VITALS — BP 118/67 | HR 78 | Temp 98.4°F | Wt 124.4 lb

## 2020-10-09 DIAGNOSIS — E119 Type 2 diabetes mellitus without complications: Secondary | ICD-10-CM

## 2020-10-09 DIAGNOSIS — M059 Rheumatoid arthritis with rheumatoid factor, unspecified: Secondary | ICD-10-CM | POA: Diagnosis not present

## 2020-10-09 DIAGNOSIS — Z794 Long term (current) use of insulin: Secondary | ICD-10-CM

## 2020-10-09 DIAGNOSIS — I10 Essential (primary) hypertension: Secondary | ICD-10-CM

## 2020-10-09 DIAGNOSIS — L299 Pruritus, unspecified: Secondary | ICD-10-CM

## 2020-10-09 LAB — POCT GLYCOSYLATED HEMOGLOBIN (HGB A1C): Hemoglobin A1C: 5.8 % — AB (ref 4.0–5.6)

## 2020-10-09 LAB — GLUCOSE, CAPILLARY: Glucose-Capillary: 87 mg/dL (ref 70–99)

## 2020-10-09 MED ORDER — LANTUS SOLOSTAR 100 UNIT/ML ~~LOC~~ SOPN: 5.0000 [IU] | PEN_INJECTOR | Freq: Every day | SUBCUTANEOUS | 1 refills | Status: DC
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
## 2020-10-09 NOTE — Assessment & Plan Note (Signed)
Has not been seen by rheumatology since late 2020. She does have some joint pain although overall the etanercept and lyrica seem to be helping.   - needs follow-up with rheum for etanercept  - cont. lyrica

## 2020-10-09 NOTE — Assessment & Plan Note (Signed)
BP Readings from Last 3 Encounters:  10/09/20 118/67  08/07/20 113/89  05/12/20 121/85   Blood pressure controlled on norvasc 10 mg qd, chlorthalidone 25 mg qd, lisinopril 40 mg qd.   - continue current regimen  - bmp today

## 2020-10-09 NOTE — Assessment & Plan Note (Signed)
She has been having diffuse itching for the past several months. Previously tried atarax and benadryl which did not help. She changed her soaps and detergents to types for sensitive skin. No rash but does have chronically dry skin. She was sent to dermatology who recommend doxepin, which also has not helped. CMP has had slightly elevated AST in the past but other LFTs normal.  Etiology likely be dry skin although etanercept notably can cause pruritis in 2-5% of individuals.   - check CMP, TSH, CBC  - start Saran anti-itch lotion  - follow-up with rheumatology to discuss possible alternative medication.

## 2020-10-09 NOTE — Assessment & Plan Note (Signed)
Last a1c 5.9. Today 5.8. AM glucose has been 70-150 in the mornings. Current medications include victoza and lantus 10U qhs. States she believes she had an optho exam in October, which was normal.   - decrease lantus to 5U qhs - she will likely not be able to discontinue completely due to c-peptide and insulin levels showing insulin dependence in 2013.  - cont. To track insulin, if am glucose >200, can increase back to 10U  - cont. Victoza - f/u in three months

## 2020-10-09 NOTE — Patient Instructions (Addendum)
Thank you for allowing [place] to provide your care today. Today we discussed your itching and type II diabetes    I have ordered the following labs for you:  CBC, TSH, and complete metabolic panel    I will call if any are abnormal.    Today we made the following changes to your medications:   Please START taking  Sarna Lotion - this will help with itching.    Please follow-up in 3 months for a1c check.   Please call the internal medicine center clinic if you have any questions or concerns, we may be able to help and keep you from a long and expensive emergency room wait. Our clinic and after hours phone number is [phone], the best time to call is Monday through Friday 9 am to 4 pm but there is always someone available 24/7 if you have an emergency. If you need medication refills please notify your pharmacy one week in advance and they will send [place] a request.

## 2020-10-10 LAB — CBC WITH DIFFERENTIAL/PLATELET
Basophils Absolute: 0 10*3/uL (ref 0.0–0.2)
Basos: 0 %
EOS (ABSOLUTE): 0.1 10*3/uL (ref 0.0–0.4)
Eos: 2 %
Hematocrit: 40.4 % (ref 34.0–46.6)
Hemoglobin: 13.6 g/dL (ref 11.1–15.9)
Immature Grans (Abs): 0 10*3/uL (ref 0.0–0.1)
Immature Granulocytes: 0 %
Lymphocytes Absolute: 2.7 10*3/uL (ref 0.7–3.1)
Lymphs: 40 %
MCH: 30.5 pg (ref 26.6–33.0)
MCHC: 33.7 g/dL (ref 31.5–35.7)
MCV: 91 fL (ref 79–97)
Monocytes Absolute: 0.5 10*3/uL (ref 0.1–0.9)
Monocytes: 7 %
Neutrophils Absolute: 3.5 10*3/uL (ref 1.4–7.0)
Neutrophils: 51 %
Platelets: 407 10*3/uL (ref 150–450)
RBC: 4.46 x10E6/uL (ref 3.77–5.28)
RDW: 13.1 % (ref 11.7–15.4)
WBC: 6.8 10*3/uL (ref 3.4–10.8)

## 2020-10-10 LAB — CMP14 + ANION GAP
ALT: 22 IU/L (ref 0–32)
AST: 155 IU/L — ABNORMAL HIGH (ref 0–40)
Albumin/Globulin Ratio: 1.4 (ref 1.2–2.2)
Albumin: 4.4 g/dL (ref 3.8–4.8)
Alkaline Phosphatase: 76 IU/L (ref 44–121)
Anion Gap: 15 mmol/L (ref 10.0–18.0)
BUN/Creatinine Ratio: 14 (ref 12–28)
BUN: 9 mg/dL (ref 8–27)
Bilirubin Total: <0.2 mg/dL (ref 0.0–1.2)
CO2: 24 mmol/L (ref 20–29)
Calcium: 9.8 mg/dL (ref 8.7–10.3)
Chloride: 100 mmol/L (ref 96–106)
Creatinine, Ser: 0.63 mg/dL (ref 0.57–1.00)
Globulin, Total: 3.2 g/dL (ref 1.5–4.5)
Glucose: 75 mg/dL (ref 65–99)
Potassium: 3.5 mmol/L (ref 3.5–5.2)
Sodium: 139 mmol/L (ref 134–144)
Total Protein: 7.6 g/dL (ref 6.0–8.5)
eGFR: 101 mL/min/{1.73_m2} (ref >59)

## 2020-10-10 LAB — TSH: TSH: 1.07 u[IU]/mL (ref 0.450–4.500)

## 2020-10-13 ENCOUNTER — Encounter: Payer: Medicaid Other | Admitting: Internal Medicine

## 2020-10-14 ENCOUNTER — Encounter: Payer: Medicaid Other | Admitting: Internal Medicine

## 2020-10-15 ENCOUNTER — Other Ambulatory Visit (HOSPITAL_COMMUNITY): Payer: Self-pay | Admitting: Podiatry

## 2020-10-19 ENCOUNTER — Telehealth: Payer: Self-pay

## 2020-10-19 ENCOUNTER — Other Ambulatory Visit: Payer: Self-pay | Admitting: Internal Medicine

## 2020-10-19 DIAGNOSIS — L299 Pruritus, unspecified: Secondary | ICD-10-CM

## 2020-10-19 NOTE — Telephone Encounter (Signed)
Patient called back to f/u with a referral to an [place].  Pt states she was seen on 10/09/2020 in clinic and was to have a Referral placed for an Allergist.  Please advise if a referral was to be placed for ''itching".

## 2020-10-19 NOTE — Telephone Encounter (Signed)
Pls contact pt regarding referral [phone]

## 2020-10-20 NOTE — Progress Notes (Signed)
Internal Medicine Clinic Attending  Case discussed with Dr. Seawell  At the time of the visit.  We reviewed the resident's history and exam and pertinent patient test results.  I agree with the assessment, diagnosis, and plan of care documented in the resident's note.  

## 2020-11-10 ENCOUNTER — Telehealth: Payer: Self-pay

## 2020-11-10 NOTE — Telephone Encounter (Signed)
Great recommendations.

## 2020-11-10 NOTE — Telephone Encounter (Signed)
Requesting to speak with a nurse about insulin, please call pt back.

## 2020-11-10 NOTE — Telephone Encounter (Signed)
RTC, VM obtained and message left that triage nurse was returning call. SChaplin, RN,BSN

## 2020-11-10 NOTE — Telephone Encounter (Signed)
Return pt's call who stated she has been itching for almost a year. She thinks the insulin is making her itch; but her doctor stated may be from Enbrel. I asked if she has tried Saran lotion as suggested by Dr Sharon Seller at [facility] 10/09/20 - stated she has not . Informed she should be able to buy it at [facility]; stated she will go today. And instructed to call back if it does not help. She voiced understanding.

## 2020-11-12 ENCOUNTER — Other Ambulatory Visit (HOSPITAL_COMMUNITY): Payer: Self-pay

## 2020-11-12 ENCOUNTER — Telehealth: Payer: Self-pay | Admitting: Internal Medicine

## 2020-11-12 MED ORDER — ENBREL 50 MG/ML ~~LOC~~ SOSY
PREFILLED_SYRINGE | SUBCUTANEOUS | 1 refills | Status: DC
  Filled 2020-11-12: qty 4, 28d supply, fill #0
  Filled 2021-01-06: qty 4, 28d supply, fill #1

## 2020-11-12 MED ORDER — NAPROXEN 375 MG PO TABS
375.0000 mg | ORAL_TABLET | Freq: Two times a day (BID) | ORAL | 0 refills | Status: DC | PRN
  Filled 2020-11-12: qty 28, 14d supply, fill #0

## 2020-11-12 NOTE — Telephone Encounter (Signed)
Called pt - no answer; left message to call the office if needs to talk to triage.

## 2020-11-12 NOTE — Telephone Encounter (Signed)
Pt requesting a call back about her medications making her itch.

## 2020-11-13 ENCOUNTER — Other Ambulatory Visit (HOSPITAL_COMMUNITY): Payer: Self-pay

## 2020-11-17 ENCOUNTER — Other Ambulatory Visit (HOSPITAL_COMMUNITY): Payer: Self-pay

## 2020-11-17 MED FILL — Pregabalin Cap 100 MG: ORAL | 30 days supply | Qty: 90 | Fill #0 | Status: AC

## 2020-11-23 ENCOUNTER — Other Ambulatory Visit (HOSPITAL_COMMUNITY): Payer: Self-pay

## 2020-11-23 MED FILL — Insulin Pen Needle 31 G X 5 MM (1/5" or 3/16"): 50 days supply | Qty: 100 | Fill #0 | Status: AC

## 2020-12-02 ENCOUNTER — Other Ambulatory Visit (HOSPITAL_COMMUNITY): Payer: Self-pay

## 2020-12-02 MED ORDER — DOXEPIN HCL 25 MG PO CAPS
ORAL_CAPSULE | ORAL | 2 refills | Status: DC
  Filled 2020-12-02: qty 90, 30d supply, fill #0

## 2020-12-09 ENCOUNTER — Other Ambulatory Visit (HOSPITAL_COMMUNITY): Payer: Self-pay

## 2020-12-09 ENCOUNTER — Other Ambulatory Visit: Payer: Self-pay

## 2020-12-09 ENCOUNTER — Ambulatory Visit (INDEPENDENT_AMBULATORY_CARE_PROVIDER_SITE_OTHER): Payer: Medicaid Other | Admitting: Allergy

## 2020-12-09 ENCOUNTER — Encounter: Payer: Self-pay | Admitting: Allergy

## 2020-12-09 VITALS — BP 116/72 | HR 64 | Temp 97.6°F | Resp 16 | Ht 62.0 in | Wt 124.4 lb

## 2020-12-09 DIAGNOSIS — L299 Pruritus, unspecified: Secondary | ICD-10-CM

## 2020-12-09 DIAGNOSIS — L508 Other urticaria: Secondary | ICD-10-CM

## 2020-12-09 DIAGNOSIS — T783XXD Angioneurotic edema, subsequent encounter: Secondary | ICD-10-CM

## 2020-12-09 MED ORDER — MONTELUKAST SODIUM 10 MG PO TABS
10.0000 mg | ORAL_TABLET | Freq: Every day | ORAL | 2 refills | Status: DC
  Filled 2020-12-09: qty 30, 30d supply, fill #0

## 2020-12-09 MED ORDER — FAMOTIDINE 20 MG PO TABS
20.0000 mg | ORAL_TABLET | Freq: Two times a day (BID) | ORAL | 2 refills | Status: DC
  Filled 2020-12-09: qty 60, 30d supply, fill #0

## 2020-12-09 MED ORDER — CAMPHOR-MENTHOL 0.5-0.5 % EX LOTN
1.0000 "application " | TOPICAL_LOTION | Freq: Two times a day (BID) | CUTANEOUS | 2 refills | Status: DC
  Filled 2020-12-09: qty 222, 111d supply, fill #0

## 2020-12-09 MED ORDER — LEVOCETIRIZINE DIHYDROCHLORIDE 5 MG PO TABS
5.0000 mg | ORAL_TABLET | Freq: Two times a day (BID) | ORAL | 2 refills | Status: DC
  Filled 2020-12-09: qty 60, 30d supply, fill #0

## 2020-12-09 NOTE — Patient Instructions (Addendum)
Itching and red, splotchy rash - at this time etiology of hives and swelling is unknown.  Hives can be caused by a variety of different triggers including illness/infection, foods, medications, stings, exercise, pressure, vibrations, extremes of temperature to name a few however majority of the time there is no identifiable trigger.  Your symptoms have been ongoing for >6 weeks making this chronic thus will obtain labwork to evaluate: tryptase, hive panel, environmental panel, alpha-gal panel, inflammatory markers and common food labs -start the following antihistamine regimen: Xyzal 5mg  1 tab twice a day, Pepcid 20mg  1 tab twice a day, Singulair 10mg  1 tab once a day at bedtime -stop Doxepin as not effective -reserve Benadryl 25 (1-2 tabs) no more than every 4-6 hours for breakthrough symptoms -if the above antihistamine regimen is not effective enough then will recommend Xolair monthly injections which will discuss in more detail at next visit.   -continue use of Sarna lotion after showering/bathing  Follow-up in 6-8 weeks
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
## 2020-12-14 ENCOUNTER — Other Ambulatory Visit: Payer: Self-pay | Admitting: *Deleted

## 2020-12-14 ENCOUNTER — Other Ambulatory Visit (HOSPITAL_COMMUNITY): Payer: Self-pay

## 2020-12-14 DIAGNOSIS — Z794 Long term (current) use of insulin: Secondary | ICD-10-CM

## 2020-12-14 DIAGNOSIS — E119 Type 2 diabetes mellitus without complications: Secondary | ICD-10-CM

## 2020-12-14 MED ORDER — LIRAGLUTIDE 18 MG/3ML ~~LOC~~ SOPN
PEN_INJECTOR | SUBCUTANEOUS | 1 refills | Status: DC
  Filled 2020-12-14: qty 3, 30d supply, fill #0
  Filled 2021-01-19: qty 3, 30d supply, fill #1

## 2020-12-14 MED FILL — Liraglutide Soln Pen-injector 18 MG/3ML (6 MG/ML): SUBCUTANEOUS | 30 days supply | Qty: 3 | Fill #0 | Status: CN

## 2020-12-14 MED FILL — Insulin Glargine Soln Pen-Injector 100 Unit/ML: SUBCUTANEOUS | 28 days supply | Qty: 3 | Fill #0 | Status: AC

## 2020-12-14 NOTE — Telephone Encounter (Signed)
Call from pharmacy-problem with Dr Clinton Sawyer [facility] # pharmacy unable to refill Victoza-new rx requested Will send to attending for review.[name]5/9/20223:50 PM

## 2020-12-15 ENCOUNTER — Other Ambulatory Visit (HOSPITAL_COMMUNITY): Payer: Self-pay

## 2020-12-15 LAB — ALPHA-GAL PANEL
Allergen Lamb IgE: <0.1 kU/L
Beef IgE: <0.1 kU/L
IgE (Immunoglobulin E), Serum: 129 IU/mL (ref 6–495)
O215-IgE Alpha-Gal: <0.1 kU/L
Pork IgE: <0.1 kU/L

## 2020-12-15 MED FILL — Insulin Pen Needle 31 G X 5 MM (1/5" or 3/16"): 50 days supply | Qty: 100 | Fill #1 | Status: CN

## 2020-12-23 LAB — ALLERGENS W/TOTAL IGE AREA 2
Alternaria Alternata IgE: <0.1 kU/L
Aspergillus Fumigatus IgE: <0.1 kU/L
Bermuda Grass IgE: <0.1 kU/L
Cat Dander IgE: <0.1 kU/L
Cedar, Mountain IgE: <0.1 kU/L
Cladosporium Herbarum IgE: <0.1 kU/L
Cockroach, German IgE: <0.1 kU/L
Common Silver Birch IgE: <0.1 kU/L
Cottonwood IgE: <0.1 kU/L
D Farinae IgE: 0.97 kU/L — AB
D Pteronyssinus IgE: 0.55 kU/L — AB
Dog Dander IgE: <0.1 kU/L
Elm, American IgE: <0.1 kU/L
IgE (Immunoglobulin E), Serum: 124 IU/mL (ref 6–495)
Johnson Grass IgE: <0.1 kU/L
Maple/Box Elder IgE: <0.1 kU/L
Mouse Urine IgE: <0.1 kU/L
Oak, White IgE: <0.1 kU/L
Pecan, Hickory IgE: <0.1 kU/L
Penicillium Chrysogen IgE: <0.1 kU/L
Pigweed, Rough IgE: <0.1 kU/L
Ragweed, Short IgE: <0.1 kU/L
Sheep Sorrel IgE Qn: <0.1 kU/L
Timothy Grass IgE: <0.1 kU/L
White Mulberry IgE: <0.1 kU/L

## 2020-12-23 LAB — ALLERGEN PROFILE, FOOD-MILK
F076-IgE Alpha Lactalbumin: <0.1 kU/L
F077-IgE Beta Lactoglobulin: <0.1 kU/L
F078-IgE Casein: <0.1 kU/L
F081-IgE Cheese, Cheddar Type: <0.1 kU/L
F082-IgE Cheese, Mold Type: <0.1 kU/L
Milk IgE: <0.1 kU/L

## 2020-12-23 LAB — ALLERGEN, WHEAT, F4: Wheat IgE: <0.1 kU/L

## 2020-12-23 LAB — ALLERGEN PROFILE, SHELLFISH
Clam IgE: <0.1 kU/L
F023-IgE Crab: <0.1 kU/L
F080-IgE Lobster: <0.1 kU/L
F290-IgE Oyster: <0.1 kU/L
Scallop IgE: <0.1 kU/L
Shrimp IgE: <0.1 kU/L

## 2020-12-23 LAB — TRYPTASE: Tryptase: 5.1 ug/L (ref 2.2–13.2)

## 2020-12-23 LAB — ALLERGENS(7)
Brazil Nut IgE: <0.1 kU/L
F020-IgE Almond: <0.1 kU/L
F202-IgE Cashew Nut: <0.1 kU/L
Hazelnut (Filbert) IgE: <0.1 kU/L
Peanut IgE: <0.1 kU/L
Pecan Nut IgE: <0.1 kU/L
Walnut IgE: <0.1 kU/L

## 2020-12-23 LAB — ALLERGEN PROFILE, FOOD-FISH
Allergen Mackerel IgE: <0.1 kU/L
Allergen Salmon IgE: <0.1 kU/L
Allergen Trout IgE: <0.1 kU/L
Allergen Walley Pike IgE: <0.1 kU/L
Codfish IgE: <0.1 kU/L
Halibut IgE: <0.1 kU/L
Tuna: <0.1 kU/L

## 2020-12-23 LAB — ALLERGEN EGG WHITE F1: Egg White IgE: <0.1 kU/L

## 2020-12-23 LAB — ANA W/REFLEX: Anti Nuclear Antibody (ANA): NEGATIVE

## 2020-12-23 LAB — CHRONIC URTICARIA: cu index: 1.7 (ref <10)

## 2020-12-23 LAB — F341-IGE CRANBERRY: Cranberry IgE: <0.1 kU/L

## 2020-12-23 LAB — ALLERGEN SOYBEAN: Soybean IgE: <0.1 kU/L

## 2020-12-23 LAB — SEDIMENTATION RATE: Sed Rate: 27 mm/hr (ref 0–40)

## 2021-01-06 ENCOUNTER — Other Ambulatory Visit (HOSPITAL_COMMUNITY): Payer: Self-pay

## 2021-01-06 MED FILL — Insulin Pen Needle 31 G X 5 MM (1/5" or 3/16"): 50 days supply | Qty: 100 | Fill #1 | Status: AC

## 2021-01-07 ENCOUNTER — Other Ambulatory Visit (HOSPITAL_COMMUNITY): Payer: Self-pay

## 2021-01-19 ENCOUNTER — Other Ambulatory Visit (HOSPITAL_COMMUNITY): Payer: Self-pay

## 2021-01-21 ENCOUNTER — Encounter: Payer: Self-pay | Admitting: *Deleted

## 2021-01-26 ENCOUNTER — Other Ambulatory Visit (HOSPITAL_COMMUNITY): Payer: Self-pay

## 2021-01-28 ENCOUNTER — Other Ambulatory Visit: Payer: Self-pay

## 2021-01-28 ENCOUNTER — Encounter: Payer: Self-pay | Admitting: Allergy

## 2021-01-28 ENCOUNTER — Ambulatory Visit (INDEPENDENT_AMBULATORY_CARE_PROVIDER_SITE_OTHER): Payer: Medicaid Other | Admitting: Allergy

## 2021-01-28 VITALS — BP 98/62 | HR 72 | Temp 97.2°F | Resp 14 | Ht 63.0 in | Wt 123.8 lb

## 2021-01-28 DIAGNOSIS — L299 Pruritus, unspecified: Secondary | ICD-10-CM | POA: Diagnosis not present

## 2021-01-28 DIAGNOSIS — L508 Other urticaria: Secondary | ICD-10-CM | POA: Diagnosis not present

## 2021-01-28 DIAGNOSIS — T783XXD Angioneurotic edema, subsequent encounter: Secondary | ICD-10-CM

## 2021-01-28 NOTE — Patient Instructions (Addendum)
Itching and red, splotchy rash - at this time etiology of hives and swelling is most likely due to dust mite exposure.  -your labs were all reassuring except for showing dust mite allergy.  Allergen avoidance measures provided -Take Singulair 10mg  1 tab once a day at bedtime -can use Doxepin 25mg  as needed for itch -continue use of Sarna lotion after showering/bathing  Follow-up in 3-4 months or sooner if needed

## 2021-01-28 NOTE — Progress Notes (Signed)
Follow-up Note  RE: IZABEL CHIM MRN: [id number] DOB: March 17, 1961 Date of Office Visit: 01/28/2021   History of present illness: Lorraine [name] is a 60 y.o. female presenting today for follow-up of urticaria, angioedema with chronic pruritus.  She was last seen in the office on 12/09/2020.  She states she is still itchy and does not feel like much is changed.  She states she did try the Xyzal, Pepcid and Singulair after the last visit but felt the medicines were "too strong" and when asked what that meant she states she was feeling like her heart was racing.  That she discontinue all of these medications.  She has been using the doxepin as needed she states she has had some occasions where she absolutely needed to take something and she use the doxepin and felt like it curb the itch.  She is willing to try the medications individually to see if she may tolerate something versus a combination.  She denies any other specific triggers.  She is not been avoiding any foods at this time.  She continues to use Sarna lotion after bathing for itch control.   Review of systems: Review of Systems  Constitutional: Negative.   HENT: Negative.    Eyes: Negative.   Respiratory: Negative.    Cardiovascular: Negative.   Gastrointestinal: Negative.   Musculoskeletal: Negative.   Skin:  Positive for itching and rash.  Neurological: Negative.    All other systems negative unless noted above in HPI  Past medical/social/surgical/family history have been reviewed and are unchanged unless specifically indicated below.  No changes  Medication List: Current Outpatient Medications  Medication Sig Dispense Refill   amLODipine (NORVASC) 10 MG tablet TAKE 1 TABLET BY MOUTH ONCE DAILY 90 tablet 3   camphor-menthol (SARNA) lotion Apply 1 application topically in the morning and at bedtime. 222 mL 2   COVID-19 mRNA vaccine, Pfizer, 30 MCG/0.3ML injection INJECT AS DIRECTED .3 mL 0   diclofenac Sodium (VOLTAREN) 1 %  GEL      doxepin (SINEQUAN) 25 MG capsule Take 25 mg by mouth at bedtime.     etanercept (ENBREL) 50 MG/ML injection INJECT 0.98 MLS (50 MG TOTAL) INTO THE SKIN ONCE A WEEK. ON MONDAY. 4 mL 5   Ferrous Sulfate (IRON PO) Take 1 tablet by mouth daily.     fluticasone (FLONASE) 50 MCG/ACT nasal spray PLACE 2 SPRAYS INTO BOTH NOSTRILS DAILY. 16 g 0   insulin glargine (LANTUS) 100 UNIT/ML Solostar Pen INJECT 5 UNITS INTO THE SKIN AT BEDTIME. 9 mL 1   Insulin Pen Needle 31G X 5 MM MISC USE TO INJECT INSULIN AS DIRECTED 100 each 11   ketoconazole (NIZORAL) 2 % shampoo APPLY 1 APPLICATION TOPICALLY 2 (TWO) TIMES A WEEK. 120 mL 0   liraglutide (VICTOZA) 18 MG/3ML SOPN INJECT 0.1 MLS (0.6 MG TOTAL) INTO THE SKIN DAILY. 3 mL 1   lisinopril (ZESTRIL) 40 MG tablet TAKE 1 TABLET (40 MG TOTAL) BY MOUTH DAILY. 90 tablet 1   Multiple Vitamins-Minerals (HAIR/SKIN/NAILS) TABS Take 1 tablet by mouth daily.     potassium chloride (KLOR-CON) 10 MEQ tablet Take 1 tablet (10 mEq total) by mouth daily. 30 tablet 2   pravastatin (PRAVACHOL) 40 MG tablet TAKE 1 TABLET (40 MG TOTAL) BY MOUTH DAILY. 90 tablet 1   pregabalin (LYRICA) 100 MG capsule TAKE 1 CAPSULE (100 MG TOTAL) BY MOUTH 3 (THREE) TIMES DAILY. 90 capsule 2   vitamin B-12 (CYANOCOBALAMIN) 1000 MCG tablet  Take 1 tablet (1,000 mcg total) by mouth daily. 30 tablet 5   amLODipine (NORVASC) 10 MG tablet TAKE 1 TABLET BY MOUTH ONCE DAILY 90 tablet 3   famotidine (PEPCID) 20 MG tablet Take 1 tablet (20 mg total) by mouth 2 (two) times daily. (Patient not taking: Reported on 01/28/2021) 60 tablet 2   levocetirizine (XYZAL) 5 MG tablet Take 1 tablet (5 mg total) by mouth 2 (two) times daily. (Patient not taking: Reported on 01/28/2021) 60 tablet 2   montelukast (SINGULAIR) 10 MG tablet Take 1 tablet (10 mg total) by mouth at bedtime. (Patient not taking: Reported on 01/28/2021) 30 tablet 2   No current facility-administered medications for this visit.     Known  medication allergies: No Known Allergies   Physical examination: Blood pressure 98/62, pulse 72, temperature (!) 97.2 F (36.2 C), temperature source Temporal, resp. rate 14, height 5\' 3"  (1.6 m), weight 123 lb 12.8 oz (56.2 kg), SpO2 97 %.  General: Alert, interactive, in no acute distress. HEENT: PERRLA, TMs pearly gray, turbinates non-edematous without discharge, post-pharynx non erythematous. Neck: Supple without lymphadenopathy. Lungs: Clear to auscultation without wheezing, rhonchi or rales. {no increased work of breathing. CV: Normal S1, S2 without murmurs. Abdomen: Nondistended, nontender. Skin: Hyperpigmented macules and excoriations on the arms bilaterally and lower extremities bilaterally . Extremities:  No clubbing, cyanosis or edema. Neuro:   Grossly intact.  Diagnositics/Labs: Labs:  Component     Latest Ref Rng & Units 12/09/2020 12/09/2020         9:52 AM 10:22 AM  IgE (Immunoglobulin E), Serum     6 - 495 IU/mL 124 129  D Pteronyssinus IgE     Class I kU/L 0.55 (A)   D Farinae IgE     Class II kU/L 0.97 (A)   Cat Dander IgE     Class 0 kU/L <0.10   Dog Dander IgE     Class 0 kU/L <0.10   [place] Grass IgE     Class 0 kU/L <0.10   Timothy Grass IgE     Class 0 kU/L <0.10   Johnson Grass IgE     Class 0 kU/L <0.10   Cockroach, German IgE     Class 0 kU/L <0.10   Penicillium Chrysogen IgE     Class 0 kU/L <0.10   Cladosporium Herbarum IgE     Class 0 kU/L <0.10   Aspergillus Fumigatus IgE     Class 0 kU/L <0.10   Alternaria Alternata IgE     Class 0 kU/L <0.10   Maple/Box Elder IgE     Class 0 kU/L <0.10   Common Silver Wendee Copp IgE     Class 0 kU/L <0.10   Cedar, [place] IgE     Class 0 kU/L <0.10   Oak, White IgE     Class 0 kU/L <0.10   Elm, American IgE     Class 0 kU/L <0.10   Cottonwood IgE     Class 0 kU/L <0.10   Pecan, Hickory IgE     Class 0 kU/L <0.10   White Mulberry IgE     Class 0 kU/L <0.10   Ragweed, Short IgE     Class 0 kU/L  <0.10   Pigweed, Rough IgE     Class 0 kU/L <0.10   Sheep Sorrel IgE Qn     Class 0 kU/L <0.10   Mouse Urine IgE     Class 0 kU/L <0.10  Peanut IgE     Class 0 kU/L <0.10   Hazelnut (Filbert) IgE     Class 0 kU/L <0.10   [place] Nut IgE     Class 0 kU/L <0.10   F020-IgE Almond     Class 0 kU/L <0.10   Pecan Nut IgE     Class 0 kU/L <0.10   F202-IgE Cashew Nut     Class 0 kU/L <0.10   Walnut IgE     Class 0 kU/L <0.10   Codfish IgE     Class 0 kU/L <0.10   Halibut IgE     Class 0 kU/L <0.10   Allergen [place]ike IgE     Class 0 kU/L <0.10   Tuna     Class 0 kU/L <0.10   Allergen Salmon IgE     Class 0 kU/L <0.10   Allergen Mackerel IgE     Class 0 kU/L <0.10   Allergen Trout IgE     Class 0 kU/L <0.10   Milk IgE     Class 0 kU/L <0.10   F076-IgE Alpha Lactalbumin     Class 0 kU/L <0.10   F077-IgE Beta Lactoglobulin     Class 0 kU/L <0.10   F078-IgE Casein     Class 0 kU/L <0.10   F081-IgE Cheese, Cheddar Type     Class 0 kU/L <0.10   F082-IgE Cheese, Mold Type     Class 0 kU/L <0.10   Clam IgE     Class 0 kU/L <0.10   F023-IgE Crab     Class 0 kU/L <0.10   Shrimp IgE     Class 0 kU/L <0.10   Scallop IgE     Class 0 kU/L <0.10   F290-IgE Oyster     Class 0 kU/L <0.10   F080-IgE Lobster     Class 0 kU/L <0.10   O215-IgE Alpha-Gal     Class 0 kU/L  <0.10  Beef IgE     Class 0 kU/L  <0.10  Pork IgE     Class 0 kU/L  <0.10  Allergen Lamb IgE     Class 0 kU/L  <0.10  Tryptase     2.2 - 13.2 ug/L 5.1   cu index     <10 1.7   Cranberry IgE     Class 0 kU/L <0.10   Egg White IgE     Class 0 kU/L <0.10   Wheat IgE     Class 0 kU/L <0.10   Soybean IgE     Class 0 kU/L <0.10   Sed Rate     0 - 40 mm/hr 27   Anti Nuclear Antibody (ANA)     Negative Negative     Assessment and plan: Urticaria, angioedema and pruritus - at this time etiology of hives and swelling is most likely due to dust mite exposure.  -your labs were all reassuring except  for showing dust mite allergy.  Allergen avoidance measures provided -Take Singulair 10mg  1 tab once a day at bedtime -can use Doxepin 25mg  as needed for itch -continue use of Sarna lotion after showering/bathing  Follow-up in 3-4 months or sooner if needed  I appreciate the opportunity to take part in Atlas's care. Please do not hesitate to contact me with questions.  Sincerely,   Prudy Feeler, MD Allergy/Immunology [facility]

## 2021-01-29 ENCOUNTER — Other Ambulatory Visit (HOSPITAL_COMMUNITY): Payer: Self-pay

## 2021-01-29 MED ORDER — MONTELUKAST SODIUM 10 MG PO TABS
10.0000 mg | ORAL_TABLET | Freq: Every day | ORAL | 2 refills | Status: DC
  Filled 2021-01-29: qty 30, 30d supply, fill #0

## 2021-02-01 ENCOUNTER — Other Ambulatory Visit (HOSPITAL_COMMUNITY): Payer: Self-pay

## 2021-02-01 MED FILL — Pregabalin Cap 100 MG: ORAL | 30 days supply | Qty: 90 | Fill #1 | Status: AC

## 2021-02-01 MED FILL — Etanercept Subcutaneous Soln Prefilled Syringe 50 MG/ML: SUBCUTANEOUS | 28 days supply | Qty: 4 | Fill #0 | Status: AC

## 2021-02-02 ENCOUNTER — Other Ambulatory Visit (HOSPITAL_COMMUNITY): Payer: Self-pay

## 2021-02-16 ENCOUNTER — Other Ambulatory Visit: Payer: Self-pay | Admitting: Internal Medicine

## 2021-02-16 ENCOUNTER — Other Ambulatory Visit (HOSPITAL_COMMUNITY): Payer: Self-pay

## 2021-02-16 DIAGNOSIS — I10 Essential (primary) hypertension: Secondary | ICD-10-CM

## 2021-02-16 MED FILL — Lisinopril Tab 40 MG: ORAL | 90 days supply | Qty: 90 | Fill #0 | Status: AC

## 2021-02-17 ENCOUNTER — Other Ambulatory Visit (HOSPITAL_COMMUNITY): Payer: Self-pay

## 2021-02-17 ENCOUNTER — Other Ambulatory Visit: Payer: Self-pay

## 2021-02-18 ENCOUNTER — Other Ambulatory Visit (HOSPITAL_COMMUNITY): Payer: Self-pay

## 2021-02-19 ENCOUNTER — Other Ambulatory Visit: Payer: Self-pay

## 2021-02-19 ENCOUNTER — Other Ambulatory Visit (HOSPITAL_COMMUNITY): Payer: Self-pay

## 2021-02-22 ENCOUNTER — Other Ambulatory Visit (HOSPITAL_COMMUNITY): Payer: Self-pay

## 2021-02-22 ENCOUNTER — Other Ambulatory Visit: Payer: Self-pay | Admitting: Internal Medicine

## 2021-02-22 DIAGNOSIS — E119 Type 2 diabetes mellitus without complications: Secondary | ICD-10-CM

## 2021-02-22 NOTE — Telephone Encounter (Signed)
liraglutide (VICTOZA) 18 MG/3ML SOPN,  amLODipine (NORVASC) 10 MG tablet   Chlorthalidone 25 MG, refill request @ [facility] [address].

## 2021-02-24 ENCOUNTER — Other Ambulatory Visit: Payer: Self-pay | Admitting: Internal Medicine

## 2021-02-24 ENCOUNTER — Other Ambulatory Visit (HOSPITAL_COMMUNITY): Payer: Self-pay

## 2021-02-24 ENCOUNTER — Other Ambulatory Visit: Payer: Self-pay | Admitting: Student

## 2021-02-24 DIAGNOSIS — E119 Type 2 diabetes mellitus without complications: Secondary | ICD-10-CM

## 2021-02-24 DIAGNOSIS — I10 Essential (primary) hypertension: Secondary | ICD-10-CM

## 2021-02-24 MED ORDER — CHLORTHALIDONE 25 MG PO TABS: 25.0000 mg | ORAL_TABLET | Freq: Every day | ORAL | 3 refills | Status: DC

## 2021-02-24 MED ORDER — AMLODIPINE BESYLATE 10 MG PO TABS: ORAL_TABLET | Freq: Every day | ORAL | 3 refills | Status: DC

## 2021-02-24 MED ORDER — VICTOZA 18 MG/3ML ~~LOC~~ SOPN: PEN_INJECTOR | SUBCUTANEOUS | 1 refills | Status: DC

## 2021-02-24 NOTE — Telephone Encounter (Signed)
Please call pt back regarding meds, states she still have not heard anything back from the office.

## 2021-02-25 ENCOUNTER — Telehealth: Payer: Self-pay

## 2021-02-25 NOTE — Telephone Encounter (Signed)
Pt had already contacted the pharmacy, states there's no meds for her at the pharmacy. Pt would like a call back.

## 2021-02-25 NOTE — Telephone Encounter (Signed)
All 3 Rxs were sent to [facility] yesterday at 1052. Patient notified and she was very Patent attorney.

## 2021-02-26 NOTE — Telephone Encounter (Signed)
F/U call - pt stated she picked up her meds yesterday from [place].

## 2021-03-08 ENCOUNTER — Encounter: Payer: Self-pay | Admitting: Internal Medicine

## 2021-03-08 ENCOUNTER — Ambulatory Visit (INDEPENDENT_AMBULATORY_CARE_PROVIDER_SITE_OTHER): Payer: Medicaid Other | Admitting: Internal Medicine

## 2021-03-08 VITALS — BP 106/70 | HR 62 | Temp 98.1°F | Wt 120.8 lb

## 2021-03-08 DIAGNOSIS — I10 Essential (primary) hypertension: Secondary | ICD-10-CM

## 2021-03-08 DIAGNOSIS — B182 Chronic viral hepatitis C: Secondary | ICD-10-CM

## 2021-03-08 DIAGNOSIS — M85852 Other specified disorders of bone density and structure, left thigh: Secondary | ICD-10-CM | POA: Diagnosis not present

## 2021-03-08 DIAGNOSIS — E785 Hyperlipidemia, unspecified: Secondary | ICD-10-CM

## 2021-03-08 DIAGNOSIS — R7401 Elevation of levels of liver transaminase levels: Secondary | ICD-10-CM

## 2021-03-08 DIAGNOSIS — Z794 Long term (current) use of insulin: Secondary | ICD-10-CM

## 2021-03-08 DIAGNOSIS — E119 Type 2 diabetes mellitus without complications: Secondary | ICD-10-CM

## 2021-03-08 LAB — POCT GLYCOSYLATED HEMOGLOBIN (HGB A1C): Hemoglobin A1C: 5.6 % (ref 4.0–5.6)

## 2021-03-08 LAB — GLUCOSE, CAPILLARY: Glucose-Capillary: 90 mg/dL (ref 70–99)

## 2021-03-08 MED ORDER — LIRAGLUTIDE 18 MG/3ML ~~LOC~~ SOPN: 1.2000 mg | PEN_INJECTOR | Freq: Every day | SUBCUTANEOUS | 2 refills | Status: DC

## 2021-03-08 NOTE — Assessment & Plan Note (Signed)
Assessment: Patient has a history of chronically elevated AST.  She does have history of HCV that was previously treated with a fibrosis score of 1/4 from a liver biopsy.   Plan: - We will get a CMP today

## 2021-03-08 NOTE — Assessment & Plan Note (Signed)
Assessment: Patient recently had a repeat DEXA scan in 2022 for osteopenia.  She does have significant hip osteopenia with a T score of -2.2.  Her FRAX score also has not significantly elevated therefore there is no indication for bisphosphonate therapy at this time.  DEXA scan results scanned into the media tab.   Plan: -Continue to monitor closely -Consider repeating vitamin D level.

## 2021-03-08 NOTE — Progress Notes (Signed)
[facility] Attending  Case discussed with Dr. Coe  At the time of the visit.  We reviewed the resident's history and exam and pertinent patient test results.  I agree with the assessment, diagnosis, and plan of care documented in the resident's note.  
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
## 2021-03-08 NOTE — Assessment & Plan Note (Signed)
Assessment: Patient presents for reevaluation of her diabetes.  Her last hemoglobin A1c was 5.8 on liraglutide 0.6 mg and Lantus 5 units nightly.  She does check her blood sugar in the morning and at night with her a.m. sugars in the 150s.  Does see a podiatrist for her foot care.  Eye exam was last October and she has an upcoming appointment for her annual diabetic eye exam.  She denies any symptoms of hypoglycemia and is tolerating her medications well.  Repeat A1c today is 5.6.   Plan: -We will plan to discontinue Lantus 5 units nightly -We will increase her Victoza to 1.2 mg daily -Continue blood sugar checks.

## 2021-03-08 NOTE — Patient Instructions (Signed)
Thank you, Ms.Orvilla C Veney for allowing [place] to provide your care today. Today we discussed diabetes, cholesterol.    I have ordered the following labs for you:   Lab Orders  Glucose, capillary  CMP14 + Anion Gap  Lipid Profile  POC Hbg A1C    Tests ordered today:  Diabetic Eye Exam - please get your annual eye exam in October.  Referrals ordered today:   Referral Orders  No referral(s) requested today     Medication Changes:   Medications Discontinued During This Encounter  Medication Reason   liraglutide (VICTOZA) 18 MG/3ML SOPN Dose change   insulin glargine (LANTUS) 100 UNIT/ML Solostar Pen Change in therapy     Meds ordered this encounter  Medications   liraglutide (VICTOZA) 18 MG/3ML SOPN    Sig: Inject 1.2 mg into the skin daily.    Dispense:  6 mL    Refill:  2     Instructions: Please note the change to your diabetes medications.   Follow up: 3 months   Remember: Should you have any questions or concerns please call the internal medicine clinic at [phone].     Marianna Payment, D.O. [place]

## 2021-03-08 NOTE — Assessment & Plan Note (Addendum)
Assessment: Patient has a history of hyperlipidemia on pravastatin.  She has not had a lipid panel in several years.  Plan: Will repeat lipid panel today.   **ADDENDUM**  Patient continues to have significantly elevated cholesterol.  Her total cholesterol is 160, HDL of 66, and LDL of 81.  Her risk factors include hypertension on medication, hyperlipidemia, diabetes, and current smoker.  CVD risk is around 13.2%.  Therefore, we will switch her from pravastatin to rosuvastatin 20 mg daily. patient was made aware of this change.

## 2021-03-08 NOTE — Assessment & Plan Note (Signed)
Assessment: Presents for reevaluation of blood pressure.  She is on amlodipine 10 mg, lisinopril 40 mg, and chlorthalidone 25 mg.  Her blood pressure today is 106/70.  She denies any symptoms of hypotension and she states that she is tolerating these medications well.   Plan: - Continue current antihypertensive medications -We will get a metabolic panel today to assess kidney function and electrolytes

## 2021-03-09 LAB — CMP14 + ANION GAP
ALT: 21 IU/L (ref 0–32)
AST: 172 IU/L — ABNORMAL HIGH (ref 0–40)
Albumin/Globulin Ratio: 1.6 (ref 1.2–2.2)
Albumin: 4.3 g/dL (ref 3.8–4.8)
Alkaline Phosphatase: 82 IU/L (ref 44–121)
Anion Gap: 16 mmol/L (ref 10.0–18.0)
BUN/Creatinine Ratio: 17 (ref 12–28)
BUN: 12 mg/dL (ref 8–27)
Bilirubin Total: 0.3 mg/dL (ref 0.0–1.2)
CO2: 25 mmol/L (ref 20–29)
Calcium: 9.5 mg/dL (ref 8.7–10.3)
Chloride: 96 mmol/L (ref 96–106)
Creatinine, Ser: 0.7 mg/dL (ref 0.57–1.00)
Globulin, Total: 2.7 g/dL (ref 1.5–4.5)
Glucose: 79 mg/dL (ref 65–99)
Potassium: 3.3 mmol/L — ABNORMAL LOW (ref 3.5–5.2)
Sodium: 137 mmol/L (ref 134–144)
Total Protein: 7 g/dL (ref 6.0–8.5)
eGFR: 98 mL/min/{1.73_m2} (ref >59)

## 2021-03-09 LAB — LIPID PANEL
Chol/HDL Ratio: 2.4 ratio (ref 0.0–4.4)
Cholesterol, Total: 160 mg/dL (ref 100–199)
HDL: 66 mg/dL (ref >39)
LDL Chol Calc (NIH): 81 mg/dL (ref 0–99)
Triglycerides: 63 mg/dL (ref 0–149)
VLDL Cholesterol Cal: 13 mg/dL (ref 5–40)

## 2021-03-11 MED ORDER — LIRAGLUTIDE 18 MG/3ML ~~LOC~~ SOPN: 1.2000 mg | PEN_INJECTOR | Freq: Every day | SUBCUTANEOUS | 2 refills | Status: DC

## 2021-03-11 MED ORDER — ROSUVASTATIN CALCIUM 20 MG PO TABS: 20.0000 mg | ORAL_TABLET | Freq: Every day | ORAL | 11 refills | Status: DC

## 2021-03-11 NOTE — Addendum Note (Signed)
Addended by: Lawerance Cruel on: 03/11/2021 10:32 AM   Modules accepted: Orders

## 2021-03-22 ENCOUNTER — Other Ambulatory Visit: Payer: Self-pay | Admitting: Internal Medicine

## 2021-03-22 DIAGNOSIS — M059 Rheumatoid arthritis with rheumatoid factor, unspecified: Secondary | ICD-10-CM

## 2021-03-22 DIAGNOSIS — M797 Fibromyalgia: Secondary | ICD-10-CM

## 2021-03-22 MED ORDER — ETANERCEPT 50 MG/ML ~~LOC~~ SOSY: PREFILLED_SYRINGE | SUBCUTANEOUS | 5 refills | Status: DC

## 2021-03-22 MED ORDER — INSULIN PEN NEEDLE 32G X 4 MM MISC: 1.0000 | Freq: Every day | 3 refills | Status: DC

## 2021-03-22 MED ORDER — PREGABALIN 100 MG PO CAPS: ORAL_CAPSULE | Freq: Three times a day (TID) | ORAL | 2 refills | Status: DC

## 2021-03-22 NOTE — Telephone Encounter (Signed)
Refill Request   pregabalin (LYRICA) 100 MG capsule  etanercept (ENBREL) 50 MG/ML injection

## 2021-03-30 ENCOUNTER — Telehealth: Payer: Self-pay | Admitting: Internal Medicine

## 2021-03-30 DIAGNOSIS — M059 Rheumatoid arthritis with rheumatoid factor, unspecified: Secondary | ICD-10-CM

## 2021-03-30 NOTE — Telephone Encounter (Signed)
Pt is requesting an order be placed for a "Walking Cane" to help her from falling when walks with her Arthritis.

## 2021-04-05 ENCOUNTER — Telehealth: Payer: Self-pay

## 2021-04-05 NOTE — Telephone Encounter (Signed)
I spoke to patient this morning and she would like to get a cane with four prongs on the bottom because of her Arthritis she said that will help her so she will not fall. I will send a message to DR. Coe who saw patient last [place], [place] C8/29/202211:25 AM

## 2021-04-05 NOTE — Telephone Encounter (Signed)
Pa was sent off for PT  ENBREL 25 MG SOLUTION .Marland Kitchen awaiting reply from insurance company VIA cover my meds ..( notice states that it will be reviewed manually )

## 2021-04-06 NOTE — Telephone Encounter (Signed)
Can was ordered for patient has she is having difficulty with ambulating safely due to her chronic knee arthritis.

## 2021-04-06 NOTE — Addendum Note (Signed)
Addended by: Lawerance Cruel on: 04/06/2021 02:30 PM   Modules accepted: Orders

## 2021-04-07 NOTE — Telephone Encounter (Signed)
I have sent a Warden/ranger to [employer] for a cane [place], [place] C8/31/202210:47 AM

## 2021-04-08 NOTE — Telephone Encounter (Signed)
Myriam Jacobson, Mamie C, [place]; [place], Leory Plowman; [place], Lenna Sciara; Gwenlyn Perking; 2 others  Received! Thank you!

## 2021-05-14 ENCOUNTER — Ambulatory Visit: Payer: Medicaid Other | Admitting: Allergy

## 2021-05-31 ENCOUNTER — Other Ambulatory Visit: Payer: Self-pay

## 2021-05-31 DIAGNOSIS — I10 Essential (primary) hypertension: Secondary | ICD-10-CM

## 2021-05-31 MED ORDER — LISINOPRIL 40 MG PO TABS: ORAL_TABLET | Freq: Every day | ORAL | 1 refills | Status: DC

## 2021-06-09 ENCOUNTER — Ambulatory Visit (INDEPENDENT_AMBULATORY_CARE_PROVIDER_SITE_OTHER): Payer: Medicaid Other | Admitting: Student

## 2021-06-09 ENCOUNTER — Encounter: Payer: Self-pay | Admitting: Student

## 2021-06-09 ENCOUNTER — Other Ambulatory Visit: Payer: Self-pay

## 2021-06-09 VITALS — BP 133/93 | HR 70 | Temp 98.3°F | Resp 28 | Ht 63.0 in | Wt 120.4 lb

## 2021-06-09 DIAGNOSIS — E119 Type 2 diabetes mellitus without complications: Secondary | ICD-10-CM | POA: Diagnosis not present

## 2021-06-09 DIAGNOSIS — I10 Essential (primary) hypertension: Secondary | ICD-10-CM

## 2021-06-09 DIAGNOSIS — Z720 Tobacco use: Secondary | ICD-10-CM

## 2021-06-09 DIAGNOSIS — Z0000 Encounter for general adult medical examination without abnormal findings: Secondary | ICD-10-CM

## 2021-06-09 DIAGNOSIS — Z794 Long term (current) use of insulin: Secondary | ICD-10-CM

## 2021-06-09 LAB — POCT GLYCOSYLATED HEMOGLOBIN (HGB A1C): Hemoglobin A1C: 5.5 % (ref 4.0–5.6)

## 2021-06-09 LAB — GLUCOSE, CAPILLARY: Glucose-Capillary: 108 mg/dL — ABNORMAL HIGH (ref 70–99)

## 2021-06-09 NOTE — Assessment & Plan Note (Signed)
Lorraine [name] reports she is still using 1-2 cigarettes daily. Previously was smoking almost 1ppd. Reports that she is not interested in medication or nicotine replacement therapy at this time. Will continue trying to quit on her own. Encouraged her to continue doing decreasing her daily tobacco use. Will need to continue to monitoring this at subsequent visits.

## 2021-06-09 NOTE — Patient Instructions (Signed)
Lorraine [name], it was a pleasure seeing you today!  Today we discussed: - Diabetes: Your A1c is 5.5%, down from 5.6% at the last visit. This is great! Continue taking the Victoza as you have been. Congratulations on this!  - Eye exam: You are due for an eye exam. Please make sure to follow-up with your ophthalmologist.  - Blood pressure: Your blood pressure today was 133/93. Continue taking your amlodipine, lisinopril, and chlorthalidone.  - No need for any further labs today. We will see you in three months!  I have ordered the following labs today:   Lab Orders         Glucose, capillary         POC Hbg A1C       Follow-up: 3 months   Please make sure to arrive 15 minutes prior to your next appointment. If you arrive late, you may be asked to reschedule.   We look forward to seeing you next time. Please call our clinic at [phone] if you have any questions or concerns. The best time to call is Monday-Friday from 9am-4pm, but there is someone available 24/7. If after hours or the weekend, call the main hospital number and ask for the Internal Medicine Resident On-Call. If you need medication refills, please notify your pharmacy one week in advance and they will send [place] a request.  Thank you for letting us take part in your care. Wishing you the best!  Thank you, Sanjuan Dame, MD

## 2021-06-09 NOTE — Assessment & Plan Note (Signed)
Patient reports compliance with her current medication regimen (listed below), including this morning. She does not take her blood pressure regularly but denies any symptoms, including dizziness, lightheadedness, chest pain, dyspnea. She had lab work completed at her last PCP visit 3 months ago, therefore will defer today. Will continue with current medication regimen.   - Amlodipine 10mg  daily - Lisinopril 40mg  daily - Chlorthalidone 25mg  daily

## 2021-06-09 NOTE — Assessment & Plan Note (Signed)
Patient reports getting flu shot last month at [facility]. She has an upcoming ophthalmology appointment later this month. Previously has had Shingrix vaccine. Currently up-to-date on all cancer screenings. Of note, patient has history of total hysterectomy.
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
## 2021-06-09 NOTE — Assessment & Plan Note (Signed)
Patient returning to clinic today to discuss diabetes. Currently she is only taking liraglutide 1.2mg  daily. Since this change at her last clinic visit, she reports her sugars have remained well-controlled. Patient did bring her glucometer today, revealing sugars 92-132, average 110. She denies having any hypoglycemic symptoms. Also denies polyuria, polydipsia, vision changes, and peripheral neuropathy.   Lorraine [name] does have an upcoming eye appointment later this month. Foot exam today normal. She does see a podiatrist, which she notes she has an upcoming appointment.   Last A1c 5.6%. Today, A1c 5.5%. Congratulated [name] on her success and encouraged her to continue her excellent work. Discussed that we can continue the Victoza today, but moving forward can consider taking off this medication, especially as her BMI is at goal.  - Continue Victoza 1.2mg  daily, consider stopping at next visit if sugars remain well-controlled - Repeat A1c in three months

## 2021-06-14 ENCOUNTER — Other Ambulatory Visit: Payer: Self-pay | Admitting: Obstetrics & Gynecology

## 2021-06-14 DIAGNOSIS — Z1231 Encounter for screening mammogram for malignant neoplasm of breast: Secondary | ICD-10-CM

## 2021-06-16 NOTE — Progress Notes (Signed)
Internal Medicine Clinic Attending ? ?Case discussed with Dr. Braswell  At the time of the visit.  We reviewed the resident?s history and exam and pertinent patient test results.  I agree with the assessment, diagnosis, and plan of care documented in the resident?s note.  ?

## 2021-06-19 NOTE — Patient Instructions (Addendum)
Itching and red, splotchy rash - at this time etiology of hives and swelling is most likely due to dust mite exposure.  -your labs were all reassuring except for showing dust mite allergy.  Continue avoidance measures -Start Singulair 10mg  1 tab once a day at bedtime.Patient cautioned that rarely some children/adults can experience behavioral changes after beginning montelukast. These side effects are rare, however, if you notice any change, notify the clinic and discontinue montelukast. -can use Doxepin 25mg  as needed at night for itch -continue use of Sarna lotion after showering/bathing - decrease your showers to once a day rather than twice a day. This can dry your skin out. - start using a daily moisturizer like Cerave, Eucerin, and Cetaphil Follow-up in 2 months or sooner if needed  Control of Dust Mite Allergen Dust mites play a major role in allergic asthma and rhinitis. They occur in environments with high humidity wherever human skin is found. Dust mites absorb humidity from the atmosphere (ie, they do not drink) and feed on organic matter (including shed human and animal skin). Dust mites are a microscopic type of insect that you cannot see with the naked eye. High levels of dust mites have been detected from mattresses, pillows, carpets, upholstered furniture, bed covers, clothes, soft toys and any woven material. The principal allergen of the dust mite is found in its feces. A gram of dust may contain 1,000 mites and 250,000 fecal particles. Mite antigen is easily measured in the air during house cleaning activities. Dust mites do not bite and do not cause harm to humans, other than by triggering allergies/asthma.  Ways to decrease your exposure to dust mites in your home:  1. Encase mattresses, box springs and pillows with a mite-impermeable barrier or cover  2. Wash sheets, blankets and drapes weekly in hot water (130 F) with detergent and dry them in a dryer on the hot  setting.  3. Have the room cleaned frequently with a vacuum cleaner and a damp dust-mop. For carpeting or rugs, vacuuming with a vacuum cleaner equipped with a high-efficiency particulate air (HEPA) filter. The dust mite allergic individual should not be in a room which is being cleaned and should wait 1 hour after cleaning before going into the room.  4. Do not sleep on upholstered furniture (eg, couches).  5. If possible removing carpeting, upholstered furniture and drapery from the home is ideal. Horizontal blinds should be eliminated in the rooms where the person spends the most time (bedroom, study, television room). Washable vinyl, roller-type shades are optimal.  6. Remove all non-washable stuffed toys from the bedroom. Wash stuffed toys weekly like sheets and blankets above.  7. Reduce indoor humidity to less than 50%. Inexpensive humidity monitors can be purchased at most hardware stores. Do not use a humidifier as can make the problem worse and are not recommended.

## 2021-06-21 ENCOUNTER — Encounter: Payer: Self-pay | Admitting: Family

## 2021-06-21 ENCOUNTER — Other Ambulatory Visit: Payer: Self-pay

## 2021-06-21 ENCOUNTER — Ambulatory Visit (INDEPENDENT_AMBULATORY_CARE_PROVIDER_SITE_OTHER): Payer: Medicaid Other | Admitting: Family

## 2021-06-21 VITALS — BP 110/78 | HR 58 | Temp 96.5°F | Resp 16

## 2021-06-21 DIAGNOSIS — T783XXD Angioneurotic edema, subsequent encounter: Secondary | ICD-10-CM

## 2021-06-21 DIAGNOSIS — L508 Other urticaria: Secondary | ICD-10-CM

## 2021-06-21 DIAGNOSIS — L299 Pruritus, unspecified: Secondary | ICD-10-CM | POA: Diagnosis not present

## 2021-06-21 MED ORDER — DOXEPIN HCL 25 MG PO CAPS: 25.0000 mg | ORAL_CAPSULE | Freq: Every day | ORAL | 1 refills | Status: DC

## 2021-06-21 MED ORDER — MONTELUKAST SODIUM 10 MG PO TABS: 10.0000 mg | ORAL_TABLET | Freq: Every day | ORAL | 5 refills | Status: DC

## 2021-06-21 NOTE — Progress Notes (Signed)
[place] [address] Dept: [phone]  FOLLOW UP NOTE  Patient ID: Lorraine [name], female    DOB: [birth year]  Age: 60 y.o. MRN: [id number] Date of Office Visit: 06/21/2021  Assessment  Chief Complaint: Follow-up (Itching and need refills)  HPI Lorraine [name] is a 60 year old female who presents today for follow-up of urticaria, angioedema, and pruritus.  She was last seen on January 28, 2021 by Dr. Nelva Bush.  Pruritus/angioedema/chronic urticaria is reported as not well controlled with doxepin 25 mg at night and Sarna lotion as needed.  She is currently not taking Singulair 10 mg once a day.  She is also currently taking a shower twice a day.  Instructed her to stop showering twice a day as this can cause her skin to dry out.  She reports that her skin cleared up for about a month but then last week the itching started back.  She denies any episodes of angioedema.  When asked if she has seen any rashes or hives.  She reports that it "looks like asthma to me."  The itching is constant and on her arms and legs.  She denies any new medications, no new products, and is not able to correlate itching with foods.  When asked if anyone else is itching she mentions that she lives by herself.  She did not have any bug bites prior to the itching starting back.  She wonders if there is a food that could be causing this.  Discussed how Dr. Nelva Bush ordered multiple foods at a previous visit and they were all negative.  When asked how long this has been going on.  She reports that she cannot remember.   Drug Allergies:  No Known Allergies  Review of Systems: Review of Systems  Constitutional:  Negative for chills and fever.  HENT:         Denies rhinorrhea, nasal congestion, and postnasal drip  Eyes:        Denies itchy watery eyes  Respiratory:  Negative for cough, shortness of breath and wheezing.   Cardiovascular:  Negative for chest pain and palpitations.  Gastrointestinal:         Denies heartburn or reflux symptoms  Genitourinary:  Negative for frequency.  Skin:  Positive for itching. Negative for rash.  Neurological:  Negative for headaches.  Endo/Heme/Allergies:  Positive for environmental allergies.    Physical Exam: BP 110/78   Pulse (!) 58   Temp (!) 96.5 F (35.8 C) (Temporal)   Resp 16   SpO2 97%    Physical Exam Constitutional:      Appearance: Normal appearance.  HENT:     Head: Normocephalic and atraumatic.     Comments: Pharynx normal, eyes normal, ears normal, nose normal    Right Ear: Tympanic membrane, ear canal and external ear normal.     Left Ear: Tympanic membrane, ear canal and external ear normal.     Nose: Nose normal.     Mouth/Throat:     Mouth: Mucous membranes are moist.     Pharynx: Oropharynx is clear.  Eyes:     Conjunctiva/sclera: Conjunctivae normal.  Cardiovascular:     Rate and Rhythm: Regular rhythm.     Heart sounds: Normal heart sounds.  Pulmonary:     Effort: Pulmonary effort is normal.     Breath sounds: Normal breath sounds.     Comments: Lungs clear to auscultation Musculoskeletal:     Cervical back: Neck supple.  Skin:  General: Skin is warm.     Comments: No rashes or urticarial lesions noted.  Dry skin noted  Neurological:     Mental Status: She is alert and oriented to person, place, and time.  Psychiatric:        Mood and Affect: Mood normal.        Behavior: Behavior normal.        Thought Content: Thought content normal.        Judgment: Judgment normal.    Diagnostics: None  Assessment and Plan: 1. Pruritus   2. Angioedema, subsequent encounter   3. Chronic urticaria     Meds ordered this encounter  Medications   doxepin (SINEQUAN) 25 MG capsule    Sig: Take 1 capsule (25 mg total) by mouth at bedtime.    Dispense:  30 capsule    Refill:  1   montelukast (SINGULAIR) 10 MG tablet    Sig: Take 1 tablet (10 mg total) by mouth at bedtime.    Dispense:  30 tablet    Refill:  5      Patient Instructions  Itching and red, splotchy rash - at this time etiology of hives and swelling is most likely due to dust mite exposure.  -your labs were all reassuring except for showing dust mite allergy.  Continue avoidance measures -Start Singulair 10mg  1 tab once a day at bedtime.Patient cautioned that rarely some children/adults can experience behavioral changes after beginning montelukast. These side effects are rare, however, if you notice any change, notify the clinic and discontinue montelukast. -can use Doxepin 25mg  as needed at night for itch -continue use of Sarna lotion after showering/bathing - decrease your showers to once a day rather than twice a day. This can dry your skin out. - start using a daily moisturizer like Cerave, Eucerin, and Cetaphil Follow-up in 2 months or sooner if needed  Control of Dust Mite Allergen Dust mites play a major role in allergic asthma and rhinitis. They occur in environments with high humidity wherever human skin is found. Dust mites absorb humidity from the atmosphere (ie, they do not drink) and feed on organic matter (including shed human and animal skin). Dust mites are a microscopic type of insect that you cannot see with the naked eye. High levels of dust mites have been detected from mattresses, pillows, carpets, upholstered furniture, bed covers, clothes, soft toys and any woven material. The principal allergen of the dust mite is found in its feces. A gram of dust may contain 1,000 mites and 250,000 fecal particles. Mite antigen is easily measured in the air during house cleaning activities. Dust mites do not bite and do not cause harm to humans, other than by triggering allergies/asthma.  Ways to decrease your exposure to dust mites in your home:  1. Encase mattresses, box springs and pillows with a mite-impermeable barrier or cover  2. Wash sheets, blankets and drapes weekly in hot water (130 F) with detergent and dry them in a  dryer on the hot setting.  3. Have the room cleaned frequently with a vacuum cleaner and a damp dust-mop. For carpeting or rugs, vacuuming with a vacuum cleaner equipped with a high-efficiency particulate air (HEPA) filter. The dust mite allergic individual should not be in a room which is being cleaned and should wait 1 hour after cleaning before going into the room.  4. Do not sleep on upholstered furniture (eg, couches).  5. If possible removing carpeting, upholstered furniture and drapery from the  home is ideal. Horizontal blinds should be eliminated in the rooms where the person spends the most time (bedroom, study, television room). Washable vinyl, roller-type shades are optimal.  6. Remove all non-washable stuffed toys from the bedroom. Wash stuffed toys weekly like sheets and blankets above.  7. Reduce indoor humidity to less than 50%. Inexpensive humidity monitors can be purchased at most hardware stores. Do not use a humidifier as can make the problem worse and are not recommended.  Return in about 2 months (around 08/21/2021), or if symptoms worsen or fail to improve.    Thank you for the opportunity to care for this patient.  Please do not hesitate to contact me with questions.  Althea Charon, FNP [facility]

## 2021-06-28 ENCOUNTER — Other Ambulatory Visit: Payer: Self-pay | Admitting: Internal Medicine

## 2021-06-28 DIAGNOSIS — M797 Fibromyalgia: Secondary | ICD-10-CM

## 2021-06-29 ENCOUNTER — Other Ambulatory Visit: Payer: Self-pay

## 2021-06-29 DIAGNOSIS — Z794 Long term (current) use of insulin: Secondary | ICD-10-CM

## 2021-06-29 DIAGNOSIS — E119 Type 2 diabetes mellitus without complications: Secondary | ICD-10-CM

## 2021-06-29 MED ORDER — LIRAGLUTIDE 18 MG/3ML ~~LOC~~ SOPN: 1.2000 mg | PEN_INJECTOR | Freq: Every day | SUBCUTANEOUS | 2 refills | Status: DC

## 2021-06-29 NOTE — Telephone Encounter (Signed)
liraglutide (VICTOZA) 18 MG/3ML SOPN (Expired), refill request @ [facility] [phone] - [place], [place].

## 2021-07-07 ENCOUNTER — Other Ambulatory Visit: Payer: Self-pay

## 2021-07-07 DIAGNOSIS — M797 Fibromyalgia: Secondary | ICD-10-CM

## 2021-07-08 MED ORDER — PREGABALIN 100 MG PO CAPS: 100.0000 mg | ORAL_CAPSULE | Freq: Three times a day (TID) | ORAL | 5 refills | Status: DC

## 2021-07-20 LAB — HM DIABETES EYE EXAM

## 2021-07-23 ENCOUNTER — Other Ambulatory Visit: Payer: Self-pay

## 2021-07-23 NOTE — Telephone Encounter (Signed)
Requesting test strips to be filled @ [facility] [phone] - [place], [place].  Please call pt back.

## 2021-07-25 MED ORDER — ACCU-CHEK GUIDE VI STRP: ORAL_STRIP | 12 refills | Status: DC

## 2021-07-26 ENCOUNTER — Other Ambulatory Visit: Payer: Self-pay

## 2021-07-26 ENCOUNTER — Ambulatory Visit
Admission: RE | Admit: 2021-07-26 | Discharge: 2021-07-26 | Disposition: A | Discharge status: Home / Self Care | Payer: Medicaid Other | Source: Ambulatory Visit | Attending: Obstetrics & Gynecology | Admitting: Obstetrics & Gynecology

## 2021-07-26 DIAGNOSIS — Z1231 Encounter for screening mammogram for malignant neoplasm of breast: Secondary | ICD-10-CM

## 2021-07-27 ENCOUNTER — Other Ambulatory Visit: Payer: Self-pay | Admitting: Student

## 2021-07-27 DIAGNOSIS — E119 Type 2 diabetes mellitus without complications: Secondary | ICD-10-CM

## 2021-07-27 MED ORDER — ACCU-CHEK GUIDE VI STRP: ORAL_STRIP | 12 refills | Status: DC

## 2021-08-03 ENCOUNTER — Other Ambulatory Visit: Payer: Self-pay

## 2021-08-03 DIAGNOSIS — E119 Type 2 diabetes mellitus without complications: Secondary | ICD-10-CM

## 2021-08-04 MED ORDER — ACCU-CHEK GUIDE VI STRP: ORAL_STRIP | 12 refills | Status: DC

## 2021-08-16 ENCOUNTER — Other Ambulatory Visit: Payer: Self-pay | Admitting: Dietician

## 2021-08-16 DIAGNOSIS — E119 Type 2 diabetes mellitus without complications: Secondary | ICD-10-CM

## 2021-08-16 NOTE — Telephone Encounter (Signed)
Got wrong test strips, has an accu chek aviva plus meter that is old and she is willing to upgrade. Pharmacy and I suggested upgrading to an accu chek guideme meter and patient agreed.

## 2021-08-17 MED ORDER — ACCU-CHEK SOFTCLIX LANCETS MISC
3 refills | Status: DC
Start: 2021-08-17 — End: 2021-10-25

## 2021-08-17 MED ORDER — ACCU-CHEK GUIDE W/DEVICE KIT: PACK | 1 refills | Status: AC

## 2021-08-18 NOTE — Telephone Encounter (Signed)
Notified patient that her meter is covered at 100% and her pharmacy will order it and it will be in on Monday.

## 2021-08-20 NOTE — Patient Instructions (Incomplete)
Itching and red, splotchy rash - at this time etiology of hives and swelling is most likely due to dust mite exposure.  -your labs were all reassuring except for showing dust mite allergy.  Continue avoidance measures -Continue Singulair 10mg  1 tab once a day at bedtime.. -can use Doxepin 25mg  as needed at night for itch -continue use of Sarna lotion after showering/bathing - decrease your showers to once a day rather than twice a day. This can dry your skin out. - start using a daily moisturizer like Cerave, Eucerin, and Cetaphil  Follow-up in months or sooner if needed  Control of Dust Mite Allergen Dust mites play a major role in allergic asthma and rhinitis. They occur in environments with high humidity wherever human skin is found. Dust mites absorb humidity from the atmosphere (ie, they do not drink) and feed on organic matter (including shed human and animal skin). Dust mites are a microscopic type of insect that you cannot see with the naked eye. High levels of dust mites have been detected from mattresses, pillows, carpets, upholstered furniture, bed covers, clothes, soft toys and any woven material. The principal allergen of the dust mite is found in its feces. A gram of dust may contain 1,000 mites and 250,000 fecal particles. Mite antigen is easily measured in the air during house cleaning activities. Dust mites do not bite and do not cause harm to humans, other than by triggering allergies/asthma.  Ways to decrease your exposure to dust mites in your home:  1. Encase mattresses, box springs and pillows with a mite-impermeable barrier or cover  2. Wash sheets, blankets and drapes weekly in hot water (130 F) with detergent and dry them in a dryer on the hot setting.  3. Have the room cleaned frequently with a vacuum cleaner and a damp dust-mop. For carpeting or rugs, vacuuming with a vacuum cleaner equipped with a high-efficiency particulate air (HEPA) filter. The dust mite allergic  individual should not be in a room which is being cleaned and should wait 1 hour after cleaning before going into the room.  4. Do not sleep on upholstered furniture (eg, couches).  5. If possible removing carpeting, upholstered furniture and drapery from the home is ideal. Horizontal blinds should be eliminated in the rooms where the person spends the most time (bedroom, study, television room). Washable vinyl, roller-type shades are optimal.  6. Remove all non-washable stuffed toys from the bedroom. Wash stuffed toys weekly like sheets and blankets above.  7. Reduce indoor humidity to less than 50%. Inexpensive humidity monitors can be purchased at most hardware stores. Do not use a humidifier as can make the problem worse and are not recommended.

## 2021-08-23 ENCOUNTER — Ambulatory Visit: Payer: Medicaid Other | Admitting: Family

## 2021-08-24 ENCOUNTER — Encounter: Payer: Self-pay | Admitting: Dietician

## 2021-08-28 NOTE — Patient Instructions (Incomplete)
Itching and red, splotchy rash - at this time etiology of hives and swelling is most likely due to dust mite exposure.  -your labs were all reassuring except for showing dust mite allergy.  Continue avoidance measures -Continue Singulair 10mg  1 tab once a day at bedtime.. -can use Doxepin 25mg  as needed at night for itch -continue use of Sarna lotion after showering/bathing - decrease your showers to once a day rather than twice a day. This can dry your skin out. - start using a daily moisturizer like Cerave, Eucerin, and Cetaphil  Follow-up in months or sooner if needed  Control of Dust Mite Allergen Dust mites play a major role in allergic asthma and rhinitis. They occur in environments with high humidity wherever human skin is found. Dust mites absorb humidity from the atmosphere (ie, they do not drink) and feed on organic matter (including shed human and animal skin). Dust mites are a microscopic type of insect that you cannot see with the naked eye. High levels of dust mites have been detected from mattresses, pillows, carpets, upholstered furniture, bed covers, clothes, soft toys and any woven material. The principal allergen of the dust mite is found in its feces. A gram of dust may contain 1,000 mites and 250,000 fecal particles. Mite antigen is easily measured in the air during house cleaning activities. Dust mites do not bite and do not cause harm to humans, other than by triggering allergies/asthma.  Ways to decrease your exposure to dust mites in your home:  1. Encase mattresses, box springs and pillows with a mite-impermeable barrier or cover  2. Wash sheets, blankets and drapes weekly in hot water (130 F) with detergent and dry them in a dryer on the hot setting.  3. Have the room cleaned frequently with a vacuum cleaner and a damp dust-mop. For carpeting or rugs, vacuuming with a vacuum cleaner equipped with a high-efficiency particulate air (HEPA) filter. The dust mite allergic  individual should not be in a room which is being cleaned and should wait 1 hour after cleaning before going into the room.  4. Do not sleep on upholstered furniture (eg, couches).  5. If possible removing carpeting, upholstered furniture and drapery from the home is ideal. Horizontal blinds should be eliminated in the rooms where the person spends the most time (bedroom, study, television room). Washable vinyl, roller-type shades are optimal.  6. Remove all non-washable stuffed toys from the bedroom. Wash stuffed toys weekly like sheets and blankets above.  7. Reduce indoor humidity to less than 50%. Inexpensive humidity monitors can be purchased at most hardware stores. Do not use a humidifier as can make the problem worse and are not recommended.

## 2021-08-30 ENCOUNTER — Ambulatory Visit: Payer: Medicaid Other | Admitting: Family

## 2021-09-07 ENCOUNTER — Other Ambulatory Visit: Payer: Self-pay

## 2021-09-07 ENCOUNTER — Ambulatory Visit (HOSPITAL_COMMUNITY)
Admission: EM | Admit: 2021-09-07 | Discharge: 2021-09-07 | Disposition: A | Discharge status: Home / Self Care | Payer: Medicaid Other | Attending: Family Medicine | Admitting: Family Medicine

## 2021-09-07 ENCOUNTER — Encounter (HOSPITAL_COMMUNITY): Payer: Self-pay

## 2021-09-07 DIAGNOSIS — J0100 Acute maxillary sinusitis, unspecified: Secondary | ICD-10-CM

## 2021-09-07 MED ORDER — DOXYCYCLINE HYCLATE 100 MG PO CAPS: 100.0000 mg | ORAL_CAPSULE | Freq: Two times a day (BID) | ORAL | 0 refills | Status: AC

## 2021-09-07 MED ORDER — ONDANSETRON 4 MG PO TBDP: 4.0000 mg | ORAL_TABLET | Freq: Three times a day (TID) | ORAL | 0 refills | Status: DC | PRN

## 2021-09-07 NOTE — Discharge Instructions (Addendum)
Take doxycycline 100 mg, 1 tablet twice daily for 7 days.  Ondansetron dissolved in the mouth every 8 hours as needed for nausea or vomiting. Clear liquids and bland things to eat.

## 2021-09-07 NOTE — ED Triage Notes (Signed)
Pt presents with facial pain and congestion x 1 week
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
## 2021-09-17 ENCOUNTER — Ambulatory Visit (HOSPITAL_COMMUNITY)
Admission: EM | Admit: 2021-09-17 | Discharge: 2021-09-17 | Disposition: A | Discharge status: Home / Self Care | Payer: Medicaid Other | Attending: Physician Assistant | Admitting: Physician Assistant

## 2021-09-17 ENCOUNTER — Encounter (HOSPITAL_COMMUNITY): Payer: Self-pay

## 2021-09-17 ENCOUNTER — Other Ambulatory Visit: Payer: Self-pay

## 2021-09-17 DIAGNOSIS — T3695XA Adverse effect of unspecified systemic antibiotic, initial encounter: Secondary | ICD-10-CM | POA: Diagnosis not present

## 2021-09-17 DIAGNOSIS — J0141 Acute recurrent pansinusitis: Secondary | ICD-10-CM | POA: Diagnosis not present

## 2021-09-17 DIAGNOSIS — B379 Candidiasis, unspecified: Secondary | ICD-10-CM

## 2021-09-17 MED ORDER — AMOXICILLIN-POT CLAVULANATE 875-125 MG PO TABS
1.0000 | ORAL_TABLET | Freq: Two times a day (BID) | ORAL | 0 refills | Status: DC
Start: 2021-09-17 — End: 2021-12-28

## 2021-09-17 MED ORDER — FLUCONAZOLE 150 MG PO TABS: 150.0000 mg | ORAL_TABLET | Freq: Once | ORAL | 0 refills | Status: AC
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
## 2021-09-17 NOTE — Discharge Instructions (Signed)
Start Augmentin twice daily for 7 days.  Use Mucinex and Flonase over-the-counter for symptom relief.  Make sure you are resting and drinking plenty of fluid.  Use Diflucan if you develop any yeast infection symptoms.  If your symptoms do not improve with this medication please follow-up with ENT as we discussed.  If you have any severe symptoms including high fever, chest pain, shortness of breath, nausea/vomiting, weakness you need to go to the emergency room.

## 2021-09-17 NOTE — ED Triage Notes (Signed)
Pt presents with continued HA, congestion, and sneezing. Pt states rx given last week has not been effective

## 2021-10-19 ENCOUNTER — Encounter: Payer: Medicaid Other | Admitting: Student

## 2021-10-25 ENCOUNTER — Ambulatory Visit (INDEPENDENT_AMBULATORY_CARE_PROVIDER_SITE_OTHER): Payer: Medicaid Other | Admitting: Student

## 2021-10-25 ENCOUNTER — Other Ambulatory Visit: Payer: Self-pay

## 2021-10-25 ENCOUNTER — Encounter: Payer: Self-pay | Admitting: Student

## 2021-10-25 VITALS — BP 120/70 | HR 61 | Temp 98.0°F | Ht 63.0 in | Wt 115.5 lb

## 2021-10-25 DIAGNOSIS — R7401 Elevation of levels of liver transaminase levels: Secondary | ICD-10-CM

## 2021-10-25 DIAGNOSIS — Z794 Long term (current) use of insulin: Secondary | ICD-10-CM

## 2021-10-25 DIAGNOSIS — M059 Rheumatoid arthritis with rheumatoid factor, unspecified: Secondary | ICD-10-CM

## 2021-10-25 DIAGNOSIS — Z720 Tobacco use: Secondary | ICD-10-CM | POA: Diagnosis not present

## 2021-10-25 DIAGNOSIS — I10 Essential (primary) hypertension: Secondary | ICD-10-CM | POA: Diagnosis not present

## 2021-10-25 DIAGNOSIS — E119 Type 2 diabetes mellitus without complications: Secondary | ICD-10-CM

## 2021-10-25 MED ORDER — LANCETS MISC: 1.0000 | Freq: Every day | 0 refills | Status: AC

## 2021-10-25 MED ORDER — LISINOPRIL 40 MG PO TABS: 40.0000 mg | ORAL_TABLET | Freq: Every day | ORAL | 2 refills | Status: DC

## 2021-10-25 MED ORDER — IGLUCOSE TEST STRIPS VI STRP: ORAL_STRIP | 12 refills | Status: AC
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
## 2021-10-25 NOTE — Assessment & Plan Note (Signed)
Ms. Renbarger reports she is smoking 1-2 cigarette daily and is attempting to wean off of them herself. She has previously been prescribed nicotine replacement therapy and has some gum at the house. She is not interested in other medications right now. Will continue to monitor.  ?

## 2021-10-25 NOTE — Patient Instructions (Signed)
[name], it was a pleasure seeing you today! ? ?Today we discussed: ?- Diabetes: I am going to re-check your A1c today. Continue taking Victoza. ? ?- I am going to get some lab work to check your kidneys and liver. I will call you with these results. ? ?- I have sent in a refill for your lisinopril. Please let us know if you need any other refills.  ? ?I have ordered the following labs today: ? ? ?Lab Orders    ?     CMP14 + Anion Gap    ?     Hemoglobin A1c     ? ?Follow-up: 6 months  ? ?Please make sure to arrive 15 minutes prior to your next appointment. If you arrive late, you may be asked to reschedule.  ? ?We look forward to seeing you next time. Please call our clinic at [phone] if you have any questions or concerns. The best time to call is Monday-Friday from 9am-4pm, but there is someone available 24/7. If after hours or the weekend, call the main hospital number and ask for the Internal Medicine Resident On-Call. If you need medication refills, please notify your pharmacy one week in advance and they will send [place] a request. ? ?Thank you for letting us take part in your care. Wishing you the best! ? ?Thank you, ?Sanjuan Dame, MD ? ?

## 2021-10-25 NOTE — Assessment & Plan Note (Addendum)
Last appointment with rheumatology 11/2020. Patient reports compliance with weekly Enbrel injection and pregabalin. Encouraged her return to rheumatology next month for annual visit. ? ?- Return rheumatology next month ?- Continue weekly Enbrel ?

## 2021-10-25 NOTE — Assessment & Plan Note (Signed)
Patient presenting to clinic today to discuss diabetes. She is taking daily Victoza and has been tolerating this well. She denies hypoglycemic episodes, polyruia, polydipsia, vision changes. She is up-to-date on annual eye and foot exam screenings. Last A1c in November 5.5%. We will repeat A1c today and have her continue with Victoza. ? ?- A1c today ?- Victoza 1.'2mg'$  daily ?- Repeat A1c in 6 months if controlled today ?

## 2021-10-25 NOTE — Assessment & Plan Note (Signed)
Patient has chronically elevated AST with history of of HCV with treatment. Patient has not had any recent issues, denies abdominal pain, nausea, vomiting, changes in bowel movements. Will obtain CMP today. ? ?- Follow-up CMP ?

## 2021-10-25 NOTE — Assessment & Plan Note (Signed)
BP Readings from Last 3 Encounters:  ?10/25/21 120/70  ?09/17/21 117/78  ?09/07/21 127/82  ? ?Blood pressure at goal. Lorraine [name] reports compliance with her medications. She is asymptomatic, denying chest pain, dyspnea, lightheadedness. Patient has re-fills on amlodipine and chlorthalidone, will send in lisinopril today. ? ?- Amlodipine '10mg'$  daily ?- Lisinopril '40mg'$  daily ?- Chlorthalidone '25mg'$  daily ?- Metabolic panel today ?

## 2021-10-26 LAB — CMP14 + ANION GAP
ALT: 17 IU/L (ref 0–32)
AST: 154 IU/L — ABNORMAL HIGH (ref 0–40)
Albumin/Globulin Ratio: 1.7 (ref 1.2–2.2)
Albumin: 4.7 g/dL (ref 3.8–4.8)
Alkaline Phosphatase: 77 IU/L (ref 44–121)
Anion Gap: 15 mmol/L (ref 10.0–18.0)
BUN/Creatinine Ratio: 14 (ref 12–28)
BUN: 10 mg/dL (ref 8–27)
Bilirubin Total: 0.2 mg/dL (ref 0.0–1.2)
CO2: 26 mmol/L (ref 20–29)
Calcium: 9.9 mg/dL (ref 8.7–10.3)
Chloride: 96 mmol/L (ref 96–106)
Creatinine, Ser: 0.69 mg/dL (ref 0.57–1.00)
Globulin, Total: 2.7 g/dL (ref 1.5–4.5)
Glucose: 91 mg/dL (ref 70–99)
Potassium: 3.4 mmol/L — ABNORMAL LOW (ref 3.5–5.2)
Sodium: 137 mmol/L (ref 134–144)
Total Protein: 7.4 g/dL (ref 6.0–8.5)
eGFR: 98 mL/min/{1.73_m2} (ref >59)

## 2021-10-26 LAB — HEMOGLOBIN A1C
Est. average glucose Bld gHb Est-mCnc: 123 mg/dL
Hgb A1c MFr Bld: 5.9 % — ABNORMAL HIGH (ref 4.8–5.6)

## 2021-11-02 NOTE — Progress Notes (Signed)
Internal Medicine Clinic Attending ? ?Case discussed with Dr. Braswell  at the time of the visit.  We reviewed the resident?s history and exam and pertinent patient test results.  I agree with the assessment, diagnosis, and plan of care documented in the resident?s note.  ?

## 2021-11-08 ENCOUNTER — Telehealth: Payer: Self-pay

## 2021-11-08 NOTE — Telephone Encounter (Signed)
PA  for pt ( ENBREL 50 MG/ML SYRINGES )  came through on cover my meds was submitted awaiting approval or denial  ?

## 2021-11-09 NOTE — Telephone Encounter (Signed)
DECISION : ? ? ? ?Approved on April 3 ? ?Approved. This drug has been approved.  ? ?Approved quantity: 4 mls per 28 day(s). You may fill up to a 34 day supply at a retail pharmacy.  ? ?You may fill up to a 90 day supply for maintenance drugs, please refer to the formulary for details.  ? ?Please call the pharmacy to process your prescription claim. ? ? ?Drug ? ? ?Enbrel '50MG'$ /ML syringes ? ?Form ?[facility] Medicaid of [employer] Prior Authorization Request Form [id number] NCPDP) ?Original Claim Info ? ?69 ? ? ? ?( COPY SENT TO PHARMACY ALSO )  ?

## 2021-11-29 ENCOUNTER — Other Ambulatory Visit (HOSPITAL_COMMUNITY): Payer: Self-pay

## 2021-12-28 ENCOUNTER — Encounter (HOSPITAL_BASED_OUTPATIENT_CLINIC_OR_DEPARTMENT_OTHER): Payer: Self-pay | Admitting: Family Medicine

## 2021-12-28 ENCOUNTER — Ambulatory Visit (INDEPENDENT_AMBULATORY_CARE_PROVIDER_SITE_OTHER): Payer: Medicaid Other | Admitting: Family Medicine

## 2021-12-28 VITALS — BP 99/62 | HR 62 | Temp 97.6°F | Ht 63.0 in | Wt 113.8 lb

## 2021-12-28 DIAGNOSIS — I10 Essential (primary) hypertension: Secondary | ICD-10-CM

## 2021-12-28 DIAGNOSIS — E119 Type 2 diabetes mellitus without complications: Secondary | ICD-10-CM | POA: Diagnosis not present

## 2021-12-28 DIAGNOSIS — E876 Hypokalemia: Secondary | ICD-10-CM | POA: Diagnosis not present

## 2021-12-28 DIAGNOSIS — Z0000 Encounter for general adult medical examination without abnormal findings: Secondary | ICD-10-CM

## 2021-12-28 DIAGNOSIS — Z794 Long term (current) use of insulin: Secondary | ICD-10-CM

## 2021-12-28 NOTE — Patient Instructions (Signed)
Stretching Exercises Stretching exercises can help athletes prevent injuries. These should be done as part of your training program and to prepare your body for an athletic activity. Ask your health care provider which exercises are safe for you. Do exercises exactly as told by your health care provider and adjust them as directed. It is normal to feel mild stretching, pulling, tightness, or discomfort as you do these exercises. Stop right away if you feel sudden pain or your pain gets worse. Do not begin these exercises until told by your health care provider. Stretching Stretching before being active gets your muscles ready to move. Stretching after your athletic activity is even more important for reducing your risk of injury. Be sure to stretch all the muscle groups in your lower body. Here are some general tips when stretching: Always warm up before stretching. Try running in place, jogging slowly, or walking briskly. Warm up gradually over 3-5 minutes. Do not rush stretching exercises. This can cause injury. Do not force a stretch or bounce while stretching. This can cause injury. Stretch slowly and smoothly. Stop stretching if you have pain. After any injury, check with your health care provider, physical therapist, or athletic trainer before going back to your stretching exercises. Exercises Try these lower body stretching exercises. Your physical therapist or athletic trainer may suggest others. Hold each stretch for 15-30 seconds and repeat 3-6 times. Side-to-side lunges  Stand with your legs spread wide apart. Turn your feet slightly outward. Keeping your back straight, bend one knee. As you bend your knee, keep your other leg straight. Lean your body toward the bent knee to obtain a greater stretch. Repeat the exercise with the other leg. Forward lower body lunges  Kneel down on one knee. Place the other leg out in front of you so your knee is directly above your ankle. Lean  forward and straighten out the kneeling leg. As you lunge forward, you should feel the stretch in your groin area. Repeat the exercise on the other side. Straight leg crossovers Stand with your legs crossed and your feet close together. Keeping your legs straight, bend over and reach for your toes. You should feel the stretch in both legs. Recross your legs the opposite way and repeat. Standing backward leg stretch  Support your weight on a wall or chair with one hand. Reach back and grab your foot with the other hand. Slowly bend your foot back toward your buttocks, keeping your knees close together side by side. Switch hands and repeat the stretch with the opposite foot. Seated lotus position stretch  Sit on the ground with your knees out wide and the soles of your feet touching (the lotus position). Lean forward, placing your forearms over the inside of your knees. Press your knees toward the ground and lean forward from the waist. Seated side straddle stretch Sit on the ground with your legs straight and spread them wide apart. Lean forward toward one knee. Place both hands on your shin or ankle to draw your chin down toward the knee. Keep your leg and back straight as you lean forward. Repeat the exercise on the other side. Seated forward stretch  Sit with your legs straight out in front of you, knees close together. Grab your shins or ankles and pull your chin down toward your knees. Keep your legs and back straight as you lean forward. Knees to chest rocking stretch Lie on your back with your knees bent. Grab your knees with your hands, and  pull them up and out toward your armpits. Gently rock from side to side while you hold the stretch. Contact a health care provider if: You have pain while doing your exercises. You have severe pain or soreness after doing your exercises. This information is not intended to replace advice given to you by your health care provider. Make  sure you discuss any questions you have with your health care provider. Document Revised: 11/17/2020 Document Reviewed: 11/17/2020 Elsevier Patient Education  [place].
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
## 2021-12-29 ENCOUNTER — Telehealth (HOSPITAL_BASED_OUTPATIENT_CLINIC_OR_DEPARTMENT_OTHER): Payer: Self-pay | Admitting: Family Medicine

## 2021-12-29 MED ORDER — POTASSIUM CHLORIDE ER 10 MEQ PO TBCR: 10.0000 meq | EXTENDED_RELEASE_TABLET | Freq: Every day | ORAL | 2 refills | Status: DC

## 2021-12-29 NOTE — Telephone Encounter (Signed)
Pt called scheduling line for a refill of her potasim medication. Pt just had her NP appt yesterday 5/23/23Pt would like a call back when that is refilled.  Please advise.

## 2021-12-30 NOTE — Assessment & Plan Note (Signed)
Adequately controlled in office, is at the lower end.  We will need to continue monitoring blood pressure and particularly if she is having issues with lightheadedness, dizziness, may need to consider decreasing dose of medications Recommend intermittent monitoring of blood pressure at home, DASH diet

## 2021-12-30 NOTE — Assessment & Plan Note (Signed)
A1c has generally been well controlled, can continue with liraglutide daily and lifestyle modifications We will plan to recheck hemoglobin A1c with upcoming labs for CPE

## 2022-01-11 ENCOUNTER — Ambulatory Visit (HOSPITAL_COMMUNITY)
Admission: EM | Admit: 2022-01-11 | Discharge: 2022-01-11 | Disposition: A | Discharge status: Home / Self Care | Payer: Medicaid Other | Attending: Internal Medicine | Admitting: Internal Medicine

## 2022-01-11 ENCOUNTER — Other Ambulatory Visit: Payer: Self-pay

## 2022-01-11 ENCOUNTER — Encounter (HOSPITAL_COMMUNITY): Payer: Self-pay | Admitting: Emergency Medicine

## 2022-01-11 DIAGNOSIS — J029 Acute pharyngitis, unspecified: Secondary | ICD-10-CM

## 2022-01-11 DIAGNOSIS — J301 Allergic rhinitis due to pollen: Secondary | ICD-10-CM

## 2022-01-11 DIAGNOSIS — K529 Noninfective gastroenteritis and colitis, unspecified: Secondary | ICD-10-CM | POA: Diagnosis not present

## 2022-01-11 LAB — CBG MONITORING, ED: Glucose-Capillary: 85 mg/dL (ref 70–99)

## 2022-01-11 LAB — POCT RAPID STREP A, ED / UC: Streptococcus, Group A Screen (Direct): NEGATIVE

## 2022-01-11 MED ORDER — ACETAMINOPHEN 325 MG PO TABS
975.0000 mg | ORAL_TABLET | Freq: Once | ORAL | Status: AC
  Administered 2022-01-11: 975 mg via ORAL

## 2022-01-11 MED ORDER — ONDANSETRON 4 MG PO TBDP
4.0000 mg | ORAL_TABLET | Freq: Once | ORAL | Status: AC
  Administered 2022-01-11: 4 mg via ORAL

## 2022-01-11 MED ORDER — ONDANSETRON 4 MG PO TBDP: 4.0000 mg | ORAL_TABLET | Freq: Three times a day (TID) | ORAL | 0 refills | Status: DC | PRN

## 2022-01-11 MED ORDER — FLUTICASONE PROPIONATE 50 MCG/ACT NA SUSP: 2.0000 | Freq: Every day | NASAL | 0 refills | Status: AC

## 2022-01-11 MED ORDER — ACETAMINOPHEN 325 MG PO TABS
ORAL_TABLET | ORAL | Status: AC
  Filled 2022-01-11: qty 3

## 2022-01-11 MED ORDER — ONDANSETRON 4 MG PO TBDP
ORAL_TABLET | ORAL | Status: AC
  Filled 2022-01-11: qty 1

## 2022-01-11 MED ORDER — MONTELUKAST SODIUM 10 MG PO TABS: 10.0000 mg | ORAL_TABLET | Freq: Every day | ORAL | 5 refills | Status: DC

## 2022-01-11 NOTE — ED Triage Notes (Signed)
Patient c/o productive cough w/ " white thick" sputum x 3 days.   Patient denies SOB or fever at home.   Patient states " I'm coughing so much I feel like I have to throw up at times".   Patients states " Some months ago, I had this same kind of think for bacteria infection and I think it's coming back".   Patient hasn't taken any medications for symptoms.   Patient denies any medical history of COPD or Asthma.
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
## 2022-01-11 NOTE — Discharge Instructions (Signed)
You were seen in urgent care today for your sore throat, cough, nausea, and vomiting..  Strep testing is negative.  Throat culture is pending.  We will call you with results if they are positive.  I have prescribed Zofran for you to take at home every 8 hours as needed for nausea and vomiting.  Increase your fluid intake to at least 64 ounces of water per day to prevent dehydration.  Ensure that you are not eating enough food during the day while you are sick to avoid low blood sugar.  Your blood sugar was normal in the clinic today.  Start taking montelukast daily for your cough related to seasonal allergies.  Used to take this medication and I would like for you to start it again.  Start taking Flonase 2 puffs into each nostril daily for further nasal drainage and cough relief.   Call to make an appointment with your primary care to follow-up on today's visit.  If you develop any new or worsening symptoms or do not improve in the next 2 to 3 days, please return.  If your symptoms are severe, please go to the emergency room.  Follow-up with your primary care provider for further evaluation and management of your symptoms as well as ongoing wellness visits.  I hope you feel better!

## 2022-01-13 LAB — CULTURE, GROUP A STREP (THRC)

## 2022-01-17 ENCOUNTER — Other Ambulatory Visit: Payer: Self-pay | Admitting: Internal Medicine

## 2022-01-17 DIAGNOSIS — E119 Type 2 diabetes mellitus without complications: Secondary | ICD-10-CM

## 2022-01-17 NOTE — Telephone Encounter (Signed)
New patient appointment with new PCP in 03/2022.

## 2022-01-24 ENCOUNTER — Ambulatory Visit (INDEPENDENT_AMBULATORY_CARE_PROVIDER_SITE_OTHER): Payer: Medicaid Other | Admitting: Family Medicine

## 2022-01-24 ENCOUNTER — Encounter (HOSPITAL_BASED_OUTPATIENT_CLINIC_OR_DEPARTMENT_OTHER): Payer: Self-pay | Admitting: Family Medicine

## 2022-01-24 VITALS — BP 118/81 | HR 70 | Ht 63.0 in | Wt 110.6 lb

## 2022-01-24 DIAGNOSIS — R300 Dysuria: Secondary | ICD-10-CM

## 2022-01-24 DIAGNOSIS — J0110 Acute frontal sinusitis, unspecified: Secondary | ICD-10-CM | POA: Diagnosis not present

## 2022-01-24 LAB — POCT URINALYSIS DIPSTICK
Blood, UA: NEGATIVE
Glucose, UA: NEGATIVE
Ketones, UA: 15
Leukocytes, UA: NEGATIVE
Nitrite, UA: NEGATIVE
Protein, UA: NEGATIVE
Spec Grav, UA: 1.02 (ref 1.010–1.025)
Urobilinogen, UA: 1 E.U./dL
pH, UA: 6.5 (ref 5.0–8.0)

## 2022-01-24 MED ORDER — AMOXICILLIN 875 MG PO TABS: 875.0000 mg | ORAL_TABLET | Freq: Two times a day (BID) | ORAL | 0 refills | Status: AC

## 2022-01-24 NOTE — Patient Instructions (Signed)
  Medication Instructions:  Your physician recommends that you continue on your current medications as directed. Please refer to the Current Medication list given to you today. --If you need a refill on any your medications before your next appointment, please call your pharmacy first. If no refills are authorized on file call the office.-- Lab Work: Your physician has recommended that you have lab work today: No If you have labs (blood work) drawn today and your tests are completely normal, you will receive your results via [place] a phone call from our staff.  Please ensure you check your voicemail in the event that you authorized detailed messages to be left on a delegated number. If you have any lab test that is abnormal or we need to change your treatment, we will call you to review the results.  Referrals/Procedures/Imaging: Urology   Follow-Up: Your next appointment:   Your physician recommends that you schedule a follow-up appointment as needed with Dr. de [place].  You will receive a text message or e-mail with a link to a survey about your care and experience with [place] today! We would greatly appreciate your feedback!   Thanks for letting us be apart of your health journey!!  Primary Care and Sports Medicine   Dr. Arlina Robes [place]   We encourage you to activate your patient portal called "MyChart".  Sign up information is provided on this After Visit Summary.  MyChart is used to connect with patients for Virtual Visits (Telemedicine).  Patients are able to view lab/test results, encounter notes, upcoming appointments, etc.  Non-urgent messages can be sent to your provider as well. To learn more about what you can do with MyChart, please visit --  [email or web address].

## 2022-01-24 NOTE — Progress Notes (Signed)
    Procedures performed today:    None.  Independent interpretation of notes and tests performed by another provider:   None.  Brief History, Exam, Impression, and Recommendations:    BP 118/81   Pulse 70   Ht '5\' 3"'$  (1.6 m)   Wt 110 lb 9.6 oz (50.2 kg)   SpO2 96%   BMI 19.59 kg/m   Sinusitis Patient presents today with multiple symptoms.  She reports that she has been having ongoing issues with sinus congestion, runny nose, postnasal drip.  She has also been having some stomach upset, nausea, occasional diarrhea.  Also feels that she has been urinating more frequently, intermittent pain/burning with urination.  She was seen at urgent care about 2 weeks ago with mostly similar symptoms and was diagnosed with allergic rhinitis, viral gastroenteritis.  Symptoms have been relatively persistent since then.  Denies any issues with fevers, no night sweats. On exam, patient is in no acute distress, afebrile.  She does have some tenderness to palpation over frontal and axillary sinuses. Urinalysis completed in the office today, no evidence of infection this time with negative nitrites and negative leukocyte esterase. Unclear if current sinus congestion is possibly related to underlying infection or allergic rhinitis that is not controlled currently.  Discussed with patient, at this time we will proceed with antibiotic therapy.  Did discuss potential risks associated with this.  She is also overdue for follow-up with her allergist, last visit was in January, chart indicates recommended 19-monthfollow-up.  Recommend that she reach out to her allergist office and schedule follow-up appointment and to see if any changes are needed in regards to allergy management  Dysuria As above, patient has been having some symptoms of dysuria Urinalysis checked today, results are reassuring No need for antibiotic therapy regarding this, no signs of UTI at this time Recommend ensuring adequate hydration,  monitoring symptoms  Return if symptoms worsen or fail to improve, for Follow-up at previously scheduled appointment.   ___________________________________________ Versie Soave de [place] MD, ABFM, CAQSM Primary Care and [place]

## 2022-01-27 DIAGNOSIS — J329 Chronic sinusitis, unspecified: Secondary | ICD-10-CM | POA: Insufficient documentation

## 2022-01-27 NOTE — Assessment & Plan Note (Signed)
As above, patient has been having some symptoms of dysuria Urinalysis checked today, results are reassuring No need for antibiotic therapy regarding this, no signs of UTI at this time Recommend ensuring adequate hydration, monitoring symptoms

## 2022-01-27 NOTE — Assessment & Plan Note (Addendum)
Patient presents today with multiple symptoms.  She reports that she has been having ongoing issues with sinus congestion, runny nose, postnasal drip.  She has also been having some stomach upset, nausea, occasional diarrhea.  Also feels that she has been urinating more frequently, intermittent pain/burning with urination.  She was seen at urgent care about 2 weeks ago with mostly similar symptoms and was diagnosed with allergic rhinitis, viral gastroenteritis.  Symptoms have been relatively persistent since then.  Denies any issues with fevers, no night sweats. On exam, patient is in no acute distress, afebrile.  She does have some tenderness to palpation over frontal and axillary sinuses. Urinalysis completed in the office today, no evidence of infection this time with negative nitrites and negative leukocyte esterase. Unclear if current sinus congestion is possibly related to underlying infection or allergic rhinitis that is not controlled currently.  Discussed with patient, at this time we will proceed with antibiotic therapy.  Did discuss potential risks associated with this.  She is also overdue for follow-up with her allergist, last visit was in January, chart indicates recommended 43-monthfollow-up.  Recommend that she reach out to her allergist office and schedule follow-up appointment and to see if any changes are needed in regards to allergy management

## 2022-03-14 ENCOUNTER — Other Ambulatory Visit: Payer: Self-pay | Admitting: Student

## 2022-03-21 ENCOUNTER — Ambulatory Visit (HOSPITAL_BASED_OUTPATIENT_CLINIC_OR_DEPARTMENT_OTHER): Payer: Medicaid Other

## 2022-03-21 DIAGNOSIS — Z0000 Encounter for general adult medical examination without abnormal findings: Secondary | ICD-10-CM

## 2022-03-22 LAB — COMPREHENSIVE METABOLIC PANEL
ALT: 13 IU/L (ref 0–32)
AST: 129 IU/L — ABNORMAL HIGH (ref 0–40)
Albumin/Globulin Ratio: 1.7 (ref 1.2–2.2)
Albumin: 4.3 g/dL (ref 3.9–4.9)
Alkaline Phosphatase: 72 IU/L (ref 44–121)
BUN/Creatinine Ratio: 9 — ABNORMAL LOW (ref 12–28)
BUN: 6 mg/dL — ABNORMAL LOW (ref 8–27)
Bilirubin Total: 0.4 mg/dL (ref 0.0–1.2)
CO2: 25 mmol/L (ref 20–29)
Calcium: 9.1 mg/dL (ref 8.7–10.3)
Chloride: 100 mmol/L (ref 96–106)
Creatinine, Ser: 0.7 mg/dL (ref 0.57–1.00)
Globulin, Total: 2.5 g/dL (ref 1.5–4.5)
Glucose: 76 mg/dL (ref 70–99)
Potassium: 3.1 mmol/L — ABNORMAL LOW (ref 3.5–5.2)
Sodium: 140 mmol/L (ref 134–144)
Total Protein: 6.8 g/dL (ref 6.0–8.5)
eGFR: 97 mL/min/{1.73_m2} (ref >59)

## 2022-03-22 LAB — CBC WITH DIFFERENTIAL/PLATELET
Basophils Absolute: 0 10*3/uL (ref 0.0–0.2)
Basos: 1 %
EOS (ABSOLUTE): 0.2 10*3/uL (ref 0.0–0.4)
Eos: 4 %
Hematocrit: 38.2 % (ref 34.0–46.6)
Hemoglobin: 12.8 g/dL (ref 11.1–15.9)
Immature Grans (Abs): 0 10*3/uL (ref 0.0–0.1)
Immature Granulocytes: 0 %
Lymphocytes Absolute: 1.6 10*3/uL (ref 0.7–3.1)
Lymphs: 31 %
MCH: 30 pg (ref 26.6–33.0)
MCHC: 33.5 g/dL (ref 31.5–35.7)
MCV: 90 fL (ref 79–97)
Monocytes Absolute: 0.6 10*3/uL (ref 0.1–0.9)
Monocytes: 12 %
Neutrophils Absolute: 2.8 10*3/uL (ref 1.4–7.0)
Neutrophils: 52 %
Platelets: 326 10*3/uL (ref 150–450)
RBC: 4.26 x10E6/uL (ref 3.77–5.28)
RDW: 13.6 % (ref 11.7–15.4)
WBC: 5.3 10*3/uL (ref 3.4–10.8)

## 2022-03-22 LAB — LIPID PANEL
Chol/HDL Ratio: 2.4 ratio (ref 0.0–4.4)
Cholesterol, Total: 143 mg/dL (ref 100–199)
HDL: 60 mg/dL (ref >39)
LDL Chol Calc (NIH): 71 mg/dL (ref 0–99)
Triglycerides: 59 mg/dL (ref 0–149)
VLDL Cholesterol Cal: 12 mg/dL (ref 5–40)

## 2022-03-22 LAB — HEMOGLOBIN A1C
Est. average glucose Bld gHb Est-mCnc: 123 mg/dL
Hgb A1c MFr Bld: 5.9 % — ABNORMAL HIGH (ref 4.8–5.6)

## 2022-03-22 LAB — TSH RFX ON ABNORMAL TO FREE T4: TSH: 0.495 u[IU]/mL (ref 0.450–4.500)

## 2022-03-30 ENCOUNTER — Encounter (HOSPITAL_BASED_OUTPATIENT_CLINIC_OR_DEPARTMENT_OTHER): Payer: Self-pay | Admitting: Family Medicine

## 2022-03-30 ENCOUNTER — Ambulatory Visit (INDEPENDENT_AMBULATORY_CARE_PROVIDER_SITE_OTHER): Payer: Medicaid Other | Admitting: Family Medicine

## 2022-03-30 VITALS — BP 111/84 | HR 60 | Temp 97.7°F | Ht 63.0 in | Wt 105.4 lb

## 2022-03-30 DIAGNOSIS — Z0000 Encounter for general adult medical examination without abnormal findings: Secondary | ICD-10-CM

## 2022-03-30 DIAGNOSIS — I10 Essential (primary) hypertension: Secondary | ICD-10-CM

## 2022-03-30 DIAGNOSIS — Z23 Encounter for immunization: Secondary | ICD-10-CM

## 2022-03-30 DIAGNOSIS — E119 Type 2 diabetes mellitus without complications: Secondary | ICD-10-CM

## 2022-03-30 MED ORDER — CHLORTHALIDONE 25 MG PO TABS: 25.0000 mg | ORAL_TABLET | Freq: Every day | ORAL | 1 refills | Status: DC

## 2022-03-30 MED ORDER — AMLODIPINE BESYLATE 10 MG PO TABS: ORAL_TABLET | Freq: Every day | ORAL | 1 refills | Status: DC

## 2022-03-30 NOTE — Assessment & Plan Note (Addendum)
Routine HCM labs reviewed. HCM reviewed/discussed. Anticipatory guidance regarding healthy weight, lifestyle and choices given. Recommend healthy diet.  Recommend approximately 150 minutes/week of moderate intensity exercise Recommend regular dental and vision exams Always use seatbelt/lap and shoulder restraints Recommend using smoke alarms and checking batteries at least twice a year Recommend using sunscreen when outside Discussed colon cancer screening recommendations, options.  Patient is UTD Discussed recommendations for shingles vaccine.  Patient has completed this at [facility] Discussed tetanus immunization recommendations, patient is UTD She is due for seasonal influenza vaccine, amenable to this today, administered today
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
## 2022-03-30 NOTE — Patient Instructions (Signed)
  Medication Instructions:  Your physician recommends that you continue on your current medications as directed. Please refer to the Current Medication list given to you today. --If you need a refill on any your medications before your next appointment, please call your pharmacy first. If no refills are authorized on file call the office.-- Lab Work: Your physician has recommended that you have lab work today: No If you have labs (blood work) drawn today and your tests are completely normal, you will receive your results via MyChart message OR a phone call from our staff.  Please ensure you check your voicemail in the event that you authorized detailed messages to be left on a delegated number. If you have any lab test that is abnormal or we need to change your treatment, we will call you to review the results.  Referrals/Procedures/Imaging: No  Follow-Up: Your next appointment:   Your physician recommends that you schedule a follow-up appointment in: 4 months with Dr. de [place].  You will receive a text message or e-mail with a link to a survey about your care and experience with us today! We would greatly appreciate your feedback!   Thanks for letting us be apart of your health journey!!  Primary Care and Sports Medicine   Dr. Raymond de Cuba   We encourage you to activate your patient portal called "MyChart".  Sign up information is provided on this After Visit Summary.  MyChart is used to connect with patients for Virtual Visits (Telemedicine).  Patients are able to view lab/test results, encounter notes, upcoming appointments, etc.  Non-urgent messages can be sent to your provider as well. To learn more about what you can do with MyChart, please visit --  [email or web address].    

## 2022-03-31 LAB — MICROALBUMIN / CREATININE URINE RATIO
Creatinine, Urine: 368 mg/dL
Microalb/Creat Ratio: 15 mg/g{creat} (ref 0–29)
Microalbumin, Urine: 56.1 ug/mL

## 2022-05-05 ENCOUNTER — Other Ambulatory Visit: Payer: Self-pay

## 2022-05-05 DIAGNOSIS — E119 Type 2 diabetes mellitus without complications: Secondary | ICD-10-CM

## 2022-05-11 ENCOUNTER — Other Ambulatory Visit: Payer: Self-pay

## 2022-05-11 DIAGNOSIS — E119 Type 2 diabetes mellitus without complications: Secondary | ICD-10-CM

## 2022-05-18 ENCOUNTER — Other Ambulatory Visit (HOSPITAL_BASED_OUTPATIENT_CLINIC_OR_DEPARTMENT_OTHER): Payer: Self-pay

## 2022-05-18 DIAGNOSIS — E119 Type 2 diabetes mellitus without complications: Secondary | ICD-10-CM

## 2022-05-18 MED ORDER — VICTOZA 18 MG/3ML ~~LOC~~ SOPN: PEN_INJECTOR | SUBCUTANEOUS | 2 refills | Status: DC

## 2022-05-21 ENCOUNTER — Encounter (HOSPITAL_COMMUNITY): Payer: Self-pay | Admitting: *Deleted

## 2022-05-21 ENCOUNTER — Other Ambulatory Visit: Payer: Self-pay

## 2022-05-21 ENCOUNTER — Ambulatory Visit (HOSPITAL_COMMUNITY)
Admission: EM | Admit: 2022-05-21 | Discharge: 2022-05-21 | Disposition: A | Discharge status: Home / Self Care | Payer: Medicaid Other | Attending: Internal Medicine | Admitting: Internal Medicine

## 2022-05-21 DIAGNOSIS — K047 Periapical abscess without sinus: Secondary | ICD-10-CM

## 2022-05-21 DIAGNOSIS — K0889 Other specified disorders of teeth and supporting structures: Secondary | ICD-10-CM | POA: Diagnosis not present

## 2022-05-21 MED ORDER — AMOXICILLIN-POT CLAVULANATE 875-125 MG PO TABS: 1.0000 | ORAL_TABLET | Freq: Two times a day (BID) | ORAL | 0 refills | Status: DC

## 2022-05-21 MED ORDER — CHLORHEXIDINE GLUCONATE 0.12 % MT SOLN: 15.0000 mL | Freq: Two times a day (BID) | OROMUCOSAL | 0 refills | Status: DC
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
## 2022-05-21 NOTE — Discharge Instructions (Signed)
Take Augmentin antibiotic 2 times daily for the next 7 days to treat dental infection.  Use chlorhexidine mouth rinse as prescribed. Continue using ibuprofen for your dental pain. Warm compresses to your mouth/jaw may help with some of the pain and infection. Call your dental surgeon on Monday to schedule an appointment for follow-up and further evaluation of this pain/infection.  If you develop any new or worsening symptoms or do not improve in the next 2 to 3 days, please return.  If your symptoms are severe, please go to the emergency room.  Follow-up with your primary care provider for further evaluation and management of your symptoms as well as ongoing wellness visits.  I hope you feel better!

## 2022-05-21 NOTE — ED Triage Notes (Signed)
PT had teeth pulled in August and still has pain. Pt is worried she has an infection.

## 2022-06-08 ENCOUNTER — Other Ambulatory Visit (HOSPITAL_BASED_OUTPATIENT_CLINIC_OR_DEPARTMENT_OTHER): Payer: Self-pay

## 2022-06-08 ENCOUNTER — Telehealth (HOSPITAL_BASED_OUTPATIENT_CLINIC_OR_DEPARTMENT_OTHER): Payer: Self-pay

## 2022-06-08 NOTE — Telephone Encounter (Signed)
Pt called and requested a refill on BD UF NANO PEN NEEDLES [id number]. Pt would like for refill to be sent to [facility] on [address]. [place], [place] [address].  Thanks

## 2022-06-09 MED ORDER — PEN NEEDLES 32G X 4 MM MISC: 1.0000 | 1 refills | Status: DC

## 2022-06-09 NOTE — Addendum Note (Signed)
Addended by: DE [place], Suhas Estis J on: 06/09/2022 08:21 AM   Modules accepted: Orders

## 2022-06-16 ENCOUNTER — Other Ambulatory Visit: Payer: Self-pay | Admitting: Family Medicine

## 2022-06-16 DIAGNOSIS — Z1231 Encounter for screening mammogram for malignant neoplasm of breast: Secondary | ICD-10-CM

## 2022-07-28 ENCOUNTER — Ambulatory Visit
Admission: RE | Admit: 2022-07-28 | Discharge: 2022-07-28 | Disposition: A | Discharge status: Home / Self Care | Payer: Medicaid Other | Source: Ambulatory Visit | Attending: Family Medicine | Admitting: Family Medicine

## 2022-07-28 DIAGNOSIS — Z1231 Encounter for screening mammogram for malignant neoplasm of breast: Secondary | ICD-10-CM

## 2022-07-29 ENCOUNTER — Encounter (HOSPITAL_BASED_OUTPATIENT_CLINIC_OR_DEPARTMENT_OTHER): Payer: Self-pay | Admitting: Family Medicine

## 2022-07-29 ENCOUNTER — Ambulatory Visit (INDEPENDENT_AMBULATORY_CARE_PROVIDER_SITE_OTHER): Payer: Medicaid Other | Admitting: Family Medicine

## 2022-07-29 VITALS — BP 119/75 | HR 66 | Resp 18 | Ht 63.0 in | Wt 108.0 lb

## 2022-07-29 DIAGNOSIS — I10 Essential (primary) hypertension: Secondary | ICD-10-CM | POA: Diagnosis not present

## 2022-07-29 DIAGNOSIS — E119 Type 2 diabetes mellitus without complications: Secondary | ICD-10-CM

## 2022-07-29 DIAGNOSIS — Z794 Long term (current) use of insulin: Secondary | ICD-10-CM | POA: Diagnosis not present

## 2022-07-29 LAB — POCT GLYCOSYLATED HEMOGLOBIN (HGB A1C): HbA1c POC (<> result, manual entry): 5.9 % (ref 4.0–5.6)

## 2022-07-29 NOTE — Assessment & Plan Note (Signed)
Patient reports that she has been doing well.  She continues with amlodipine, chlorthalidone, lisinopril as prescribed.  Blood pressure in office today is at goal.  No reported issues with chest pain or headaches.  She has not been having any new lightheadedness or dizziness. Can continue with current medication regimen.  Recommend intermittent monitoring of blood pressure at home, DASH diet

## 2022-07-29 NOTE — Assessment & Plan Note (Addendum)
Most recent hemoglobin A1c has shown good control of blood sugar.  Patient continues with liraglutide.  Denies any issues with medication at this time.  No reported concerns of polyuria or polydipsia We will proceed with check of hemoglobin A1c today to assess recent control She indicates that she did have retinopathy screening recently with [facility].  She indicates that they were planning to send office visit note to [place]

## 2022-07-29 NOTE — Progress Notes (Signed)
    Procedures performed today:    None.  Independent interpretation of notes and tests performed by another provider:   None.  Brief History, Exam, Impression, and Recommendations:    BP 119/75 (BP Location: Left Arm, Patient Position: Sitting, Cuff Size: Normal)   Pulse 66   Resp 18   Ht '5\' 3"'$  (1.6 m)   Wt 108 lb (49 kg)   SpO2 100%   BMI 19.13 kg/m   Essential hypertension Patient reports that she has been doing well.  She continues with amlodipine, chlorthalidone, lisinopril as prescribed.  Blood pressure in office today is at goal.  No reported issues with chest pain or headaches.  She has not been having any new lightheadedness or dizziness. Can continue with current medication regimen.  Recommend intermittent monitoring of blood pressure at home, DASH diet  Type 2 diabetes mellitus ([place]) Most recent hemoglobin A1c has shown good control of blood sugar.  Patient continues with liraglutide.  Denies any issues with medication at this time.  No reported concerns of polyuria or polydipsia We will proceed with check of hemoglobin A1c today to assess recent control  Return in about 4 months (around 11/28/2022) for DM, HTN. Patient did have mammogram completed recently, results reviewed today, no concerns on mammogram with recommended repeat screening in 1 year   ___________________________________________ Charlyn Vialpando de [place], MD, ABFM, CAQ[facility] [place]

## 2022-08-26 ENCOUNTER — Other Ambulatory Visit (HOSPITAL_BASED_OUTPATIENT_CLINIC_OR_DEPARTMENT_OTHER): Payer: Self-pay | Admitting: Family Medicine

## 2022-08-26 DIAGNOSIS — E119 Type 2 diabetes mellitus without complications: Secondary | ICD-10-CM

## 2022-09-07 ENCOUNTER — Other Ambulatory Visit: Payer: Self-pay | Admitting: Internal Medicine

## 2022-09-07 DIAGNOSIS — M797 Fibromyalgia: Secondary | ICD-10-CM

## 2022-09-27 ENCOUNTER — Encounter (HOSPITAL_BASED_OUTPATIENT_CLINIC_OR_DEPARTMENT_OTHER): Payer: Self-pay | Admitting: Family Medicine

## 2022-09-27 ENCOUNTER — Ambulatory Visit (INDEPENDENT_AMBULATORY_CARE_PROVIDER_SITE_OTHER): Payer: Medicaid Other | Admitting: Family Medicine

## 2022-09-27 VITALS — BP 99/74 | HR 62 | Ht 63.0 in | Wt 107.2 lb

## 2022-09-27 DIAGNOSIS — B379 Candidiasis, unspecified: Secondary | ICD-10-CM | POA: Diagnosis not present

## 2022-09-27 DIAGNOSIS — J0100 Acute maxillary sinusitis, unspecified: Secondary | ICD-10-CM | POA: Diagnosis not present

## 2022-09-27 DIAGNOSIS — T3695XA Adverse effect of unspecified systemic antibiotic, initial encounter: Secondary | ICD-10-CM

## 2022-09-27 MED ORDER — AMOXICILLIN-POT CLAVULANATE 875-125 MG PO TABS: 1.0000 | ORAL_TABLET | Freq: Two times a day (BID) | ORAL | 0 refills | Status: DC

## 2022-09-27 MED ORDER — FLUCONAZOLE 150 MG PO TABS: 150.0000 mg | ORAL_TABLET | Freq: Once | ORAL | 0 refills | Status: AC

## 2022-09-27 NOTE — Patient Instructions (Signed)
You were prescribed an antibiotic today. Please complete the full course.  Acetaminophen or ibuprofen for fever according to package instructions, do not exceed recommended doses.  Adequate fluids to avoid dehydration. Lots of rest while you are recovering.  Follow-up if your symptoms do not improve.    Sinus rinses.

## 2022-09-27 NOTE — Assessment & Plan Note (Signed)
Presents for an acute visit with complaint of "sinus infection" reports that her symptoms include head pressure, sneezing, bilateral ear pain and productive sputum.  "Every year and get a sinus infection "symptoms have been present for 3 weeks.  She denies fever and chills.  She has tried Sudafed which has not been effective in resolving her symptoms.  She has maxillary and frontal sinus tenderness upon physical exam, -symptoms consistent with sinusitis.  Augmentin 875-125 mg 1 twice per day for 10 days.  She reports she gets antibiotic induced yeast infection, fluconazole 150 mg x 1 prescribed.  Recommend sinus rinses with normal saline.  She will follow-up with PCP if symptoms do not resolve.

## 2022-09-27 NOTE — Progress Notes (Signed)
Established Patient Office Visit  Subjective   Patient ID: KYNZEE TERZIAN, female    DOB: 06/18/1961  Age: 62 y.o. MRN: [id number]  Chief Complaint  Patient presents with   Sinusitis    Pt here for having a sinus infection, pt stated she has had head pressure, sneezing, ear pain and mucus coming up, she stated she has been dealing with symptoms for three weeks now     HPI  Presents today for an acute visit with complaint of "sinus infection" symptoms include head pressure, sneezing, bilateral ear pain, and mucus. "Every year I get a sinus infection" Symptoms have been present  three weeks.  Associated symptoms include: productive sputum  Pertinent negatives: no fever or chills Pain severity: 0/10  Treatments tried include : sudafed  Treatment effective : not effective Sick contacts : n/a   Review of Systems  Constitutional:  Negative for chills and fever.  HENT:  Positive for congestion, ear pain and sinus pain.   Respiratory:  Negative for shortness of breath.   Cardiovascular:  Negative for chest pain.  Gastrointestinal:  Negative for nausea and vomiting.  Neurological:  Positive for headaches (sinus pressure).      Objective:     BP 99/74   Pulse 62   Ht 5' 3"$  (1.6 m)   Wt 107 lb 3.2 oz (48.6 kg)   SpO2 100%   BMI 18.99 kg/m  BP Readings from Last 3 Encounters:  09/27/22 99/74  07/29/22 119/75  05/21/22 137/88      Physical Exam Vitals and nursing note reviewed.  Constitutional:      General: She is not in acute distress.    Appearance: Normal appearance. She is normal weight.  HENT:     Right Ear: Tympanic membrane normal.     Left Ear: Tympanic membrane normal.     Nose: Congestion present.     Right Turbinates: Swollen.     Left Turbinates: Swollen.     Right Sinus: Maxillary sinus tenderness and frontal sinus tenderness present.     Left Sinus: Maxillary sinus tenderness and frontal sinus tenderness present.     Mouth/Throat:     Mouth: Mucous  membranes are moist.     Pharynx: Oropharynx is clear. Uvula midline. No pharyngeal swelling, oropharyngeal exudate, posterior oropharyngeal erythema or uvula swelling.  Cardiovascular:     Heart sounds: Normal heart sounds.  Pulmonary:     Effort: Pulmonary effort is normal.     Breath sounds: Normal breath sounds.  Skin:    General: Skin is warm and dry.  Neurological:     General: No focal deficit present.     Mental Status: She is alert. Mental status is at baseline.  Psychiatric:        Mood and Affect: Mood normal.        Behavior: Behavior normal.        Thought Content: Thought content normal.        Judgment: Judgment normal.     No results found for any visits on 09/27/22.    The 10-year ASCVD risk score (Arnett DK, et al., 2019) is: 13.6%    Assessment & Plan:   Problem List Items Addressed This Visit     Antibiotic-induced yeast infection   Relevant Medications   fluconazole (DIFLUCAN) 150 MG tablet   Sinusitis - Primary    Presents for an acute visit with complaint of "sinus infection" reports that her symptoms include head pressure, sneezing, bilateral ear  pain and productive sputum.  "Every year and get a sinus infection "symptoms have been present for 3 weeks.  She denies fever and chills.  She has tried Sudafed which has not been effective in resolving her symptoms.  She has maxillary and frontal sinus tenderness upon physical exam, -symptoms consistent with sinusitis.  Augmentin 875-125 mg 1 twice per day for 10 days.  She reports she gets antibiotic induced yeast infection, fluconazole 150 mg x 1 prescribed.  Recommend sinus rinses with normal saline.  She will follow-up with PCP if symptoms do not resolve.      Relevant Medications   amoxicillin-clavulanate (AUGMENTIN) 875-125 MG tablet   fluconazole (DIFLUCAN) 150 MG tablet  Agrees with plan of care discussed.  Questions answered.   Return if symptoms worsen or fail to improve.    Chalmers Guest,  FNP

## 2022-10-21 ENCOUNTER — Other Ambulatory Visit: Payer: Self-pay | Admitting: Student

## 2022-10-21 ENCOUNTER — Other Ambulatory Visit (HOSPITAL_BASED_OUTPATIENT_CLINIC_OR_DEPARTMENT_OTHER): Payer: Self-pay | Admitting: Family Medicine

## 2022-10-21 DIAGNOSIS — I10 Essential (primary) hypertension: Secondary | ICD-10-CM

## 2022-10-26 ENCOUNTER — Other Ambulatory Visit (HOSPITAL_BASED_OUTPATIENT_CLINIC_OR_DEPARTMENT_OTHER): Payer: Self-pay | Admitting: Family Medicine

## 2022-10-26 ENCOUNTER — Other Ambulatory Visit: Payer: Self-pay | Admitting: Student

## 2022-10-26 DIAGNOSIS — I10 Essential (primary) hypertension: Secondary | ICD-10-CM

## 2022-10-27 ENCOUNTER — Other Ambulatory Visit: Payer: Self-pay | Admitting: Student

## 2022-10-27 DIAGNOSIS — I10 Essential (primary) hypertension: Secondary | ICD-10-CM

## 2022-11-10 ENCOUNTER — Other Ambulatory Visit: Payer: Self-pay | Admitting: Internal Medicine

## 2022-11-10 DIAGNOSIS — I10 Essential (primary) hypertension: Secondary | ICD-10-CM

## 2022-11-11 ENCOUNTER — Other Ambulatory Visit (HOSPITAL_BASED_OUTPATIENT_CLINIC_OR_DEPARTMENT_OTHER): Payer: Self-pay

## 2022-11-11 DIAGNOSIS — I10 Essential (primary) hypertension: Secondary | ICD-10-CM

## 2022-11-11 MED ORDER — LISINOPRIL 40 MG PO TABS: 40.0000 mg | ORAL_TABLET | Freq: Every day | ORAL | 2 refills | Status: DC

## 2022-11-28 ENCOUNTER — Ambulatory Visit (HOSPITAL_BASED_OUTPATIENT_CLINIC_OR_DEPARTMENT_OTHER): Payer: Medicaid Other | Admitting: Family Medicine

## 2022-12-01 ENCOUNTER — Telehealth (HOSPITAL_BASED_OUTPATIENT_CLINIC_OR_DEPARTMENT_OTHER): Payer: Self-pay | Admitting: Family Medicine

## 2022-12-01 ENCOUNTER — Other Ambulatory Visit (HOSPITAL_BASED_OUTPATIENT_CLINIC_OR_DEPARTMENT_OTHER): Payer: Self-pay | Admitting: Family Medicine

## 2022-12-01 DIAGNOSIS — Z1231 Encounter for screening mammogram for malignant neoplasm of breast: Secondary | ICD-10-CM

## 2022-12-01 NOTE — Telephone Encounter (Signed)
Pt is calling wanting Mammogram order to be placed   Last Mammo was 07-28-22

## 2022-12-01 NOTE — Telephone Encounter (Signed)
Order has been placed.

## 2022-12-20 ENCOUNTER — Other Ambulatory Visit (HOSPITAL_COMMUNITY)
Admission: RE | Admit: 2022-12-20 | Discharge: 2022-12-20 | Disposition: A | Discharge status: Home / Self Care | Payer: Medicaid Other | Source: Ambulatory Visit | Attending: Family Medicine | Admitting: Family Medicine

## 2022-12-20 ENCOUNTER — Encounter (HOSPITAL_BASED_OUTPATIENT_CLINIC_OR_DEPARTMENT_OTHER): Payer: Self-pay | Admitting: Family Medicine

## 2022-12-20 ENCOUNTER — Ambulatory Visit (INDEPENDENT_AMBULATORY_CARE_PROVIDER_SITE_OTHER): Payer: Medicaid Other | Admitting: Family Medicine

## 2022-12-20 VITALS — BP 136/82 | HR 58 | Ht 63.0 in | Wt 109.0 lb

## 2022-12-20 DIAGNOSIS — Z124 Encounter for screening for malignant neoplasm of cervix: Secondary | ICD-10-CM

## 2022-12-20 DIAGNOSIS — M797 Fibromyalgia: Secondary | ICD-10-CM | POA: Diagnosis not present

## 2022-12-20 MED ORDER — PREGABALIN 25 MG PO CAPS
25.0000 mg | ORAL_CAPSULE | Freq: Three times a day (TID) | ORAL | 3 refills | Status: DC
Start: 2022-12-20 — End: 2023-03-22

## 2022-12-20 NOTE — Progress Notes (Signed)
   Established Patient Office Visit  Subjective   Patient ID: Lorraine [name], female    DOB: 11/16/1960  Age: 62 y.o. MRN: [id number]  Chief Complaint  Patient presents with   Gynecologic Exam    Lorraine [name] is a 62 yo female patient who presents today for a pap smear. Reports she had her physical completed recently. Last CPE in 03/2022.   She also reports that her rheumatologist recommends decreasing her Lyrica to 25mg  TID  Review of Systems  Constitutional:  Negative for malaise/fatigue.  Eyes:  Negative for blurred vision and double vision.  Respiratory:  Negative for cough and shortness of breath.   Cardiovascular:  Negative for chest pain and palpitations.  Gastrointestinal:  Negative for abdominal pain, nausea and vomiting.  Musculoskeletal:  Negative for myalgias.  Neurological:  Negative for dizziness, weakness and headaches.  Psychiatric/Behavioral:  Negative for depression and suicidal ideas. The patient is not nervous/anxious.     Objective:     BP 136/82   Pulse (!) 58   Ht 5\' 3"  (1.6 m)   Wt 109 lb (49.4 kg)   SpO2 100%   BMI 19.31 kg/m  BP Readings from Last 3 Encounters:  12/20/22 136/82  09/27/22 99/74  07/29/22 119/75     Physical Exam Exam conducted with a chaperone present.  Constitutional:      Appearance: Normal appearance.  Cardiovascular:     Rate and Rhythm: Normal rate and regular rhythm.     Pulses: Normal pulses.     Heart sounds: Normal heart sounds.  Pulmonary:     Effort: Pulmonary effort is normal.     Breath sounds: Normal breath sounds.  Genitourinary:    General: Normal vulva.     Exam position: Lithotomy position.     Labia:        Right: No rash or tenderness.        Left: No rash or tenderness.   Neurological:     Mental Status: She is alert.  Psychiatric:        Mood and Affect: Mood normal.        Behavior: Behavior normal.        Thought Content: Thought content normal.        Judgment: Judgment normal.     Assessment & Plan:  1. Screening for cervical cancer Patient presents for her routine Pap smear. Advised patient that Pap smears are generally discontinued at age 32 if prior screening results were normal. Review of chart, see previous results from 2006 & 2007 with normal results and negative HPV. Patient has vaginal hysterectomy performed in 2000- unclear if total or partial. Most likely total since no cervix was present on exam. Vaginal pap performed today. Will update patient with results.  - Cytology - PAP  2. Fibromyalgia Patient reports her rheumatologist recommended decreasing her Lyrica dose from 100mg  three times daily to 25mg  three times daily. Will send in new prescription for patient.  - pregabalin (LYRICA) 25 MG capsule; Take 1 capsule (25 mg total) by mouth 3 (three) times daily.  Dispense: 90 capsule; Refill: 3   Return in about 3 months (around 03/22/2023) for HTN follow-up.    Alyson Reedy, FNP

## 2022-12-23 LAB — CYTOLOGY - PAP
Comment: NEGATIVE
Diagnosis: NEGATIVE
High risk HPV: NEGATIVE

## 2022-12-26 ENCOUNTER — Telehealth (HOSPITAL_BASED_OUTPATIENT_CLINIC_OR_DEPARTMENT_OTHER): Payer: Self-pay | Admitting: Family Medicine

## 2022-12-26 NOTE — Telephone Encounter (Signed)
Pt called states that she seen Dr Ang --and he wants her to see a Rheumatoid Arth provider, and advised her to call her PCP to get the referral .   Pt call back is [phone]

## 2022-12-28 ENCOUNTER — Other Ambulatory Visit (HOSPITAL_BASED_OUTPATIENT_CLINIC_OR_DEPARTMENT_OTHER): Payer: Self-pay

## 2022-12-28 NOTE — Telephone Encounter (Signed)
Pt would like a Local Rheumatology provider

## 2022-12-28 NOTE — Telephone Encounter (Signed)
Patient has an appointment with Dr.Xu tomorrow, if referral is not placed at that visit will have patient come in for a visit to discuss issue to have office notes regarding reason for referral

## 2022-12-30 ENCOUNTER — Other Ambulatory Visit (INDEPENDENT_AMBULATORY_CARE_PROVIDER_SITE_OTHER): Payer: Medicaid Other

## 2022-12-30 ENCOUNTER — Ambulatory Visit (INDEPENDENT_AMBULATORY_CARE_PROVIDER_SITE_OTHER): Payer: Medicaid Other | Admitting: Orthopaedic Surgery

## 2022-12-30 ENCOUNTER — Encounter: Payer: Self-pay | Admitting: Orthopaedic Surgery

## 2022-12-30 DIAGNOSIS — M1611 Unilateral primary osteoarthritis, right hip: Secondary | ICD-10-CM

## 2022-12-30 DIAGNOSIS — M1612 Unilateral primary osteoarthritis, left hip: Secondary | ICD-10-CM

## 2022-12-30 DIAGNOSIS — M160 Bilateral primary osteoarthritis of hip: Secondary | ICD-10-CM | POA: Diagnosis not present

## 2022-12-30 NOTE — Addendum Note (Signed)
Addended by: Wendi Maya on: 12/30/2022 09:05 AM   Modules accepted: Orders
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
## 2023-01-04 ENCOUNTER — Other Ambulatory Visit (HOSPITAL_BASED_OUTPATIENT_CLINIC_OR_DEPARTMENT_OTHER): Payer: Self-pay | Admitting: Family Medicine

## 2023-01-12 ENCOUNTER — Ambulatory Visit
Admission: RE | Admit: 2023-01-12 | Discharge: 2023-01-12 | Disposition: A | Discharge status: Home / Self Care | Payer: Medicaid Other | Source: Ambulatory Visit | Attending: Internal Medicine | Admitting: Internal Medicine

## 2023-01-12 ENCOUNTER — Other Ambulatory Visit: Payer: Self-pay | Admitting: Internal Medicine

## 2023-01-12 DIAGNOSIS — R7612 Nonspecific reaction to cell mediated immunity measurement of gamma interferon antigen response without active tuberculosis: Secondary | ICD-10-CM

## 2023-01-12 DIAGNOSIS — Z111 Encounter for screening for respiratory tuberculosis: Secondary | ICD-10-CM

## 2023-01-17 ENCOUNTER — Telehealth (HOSPITAL_BASED_OUTPATIENT_CLINIC_OR_DEPARTMENT_OTHER): Payer: Self-pay | Admitting: Family Medicine

## 2023-01-17 NOTE — Telephone Encounter (Signed)
lvm to schedule diabetic retina screening 01/26/23

## 2023-02-07 LAB — COMPREHENSIVE METABOLIC PANEL: eGFR: 90

## 2023-02-07 LAB — BASIC METABOLIC PANEL: Creatinine: 0.7 (ref 0.5–1.1)

## 2023-02-15 ENCOUNTER — Other Ambulatory Visit (HOSPITAL_BASED_OUTPATIENT_CLINIC_OR_DEPARTMENT_OTHER): Payer: Self-pay | Admitting: Family Medicine

## 2023-02-15 ENCOUNTER — Telehealth (HOSPITAL_BASED_OUTPATIENT_CLINIC_OR_DEPARTMENT_OTHER): Payer: Self-pay | Admitting: Family Medicine

## 2023-02-15 DIAGNOSIS — E119 Type 2 diabetes mellitus without complications: Secondary | ICD-10-CM

## 2023-02-15 NOTE — Telephone Encounter (Signed)
Patient calling in to request an order for a high seat toilet since she is getting a hip replacement, please advise patient

## 2023-02-16 NOTE — Telephone Encounter (Signed)
LVM for patient to return call. 

## 2023-02-16 NOTE — Telephone Encounter (Signed)
Pt lvm , please call her back

## 2023-02-22 ENCOUNTER — Encounter: Payer: Self-pay | Admitting: Orthopaedic Surgery

## 2023-02-22 ENCOUNTER — Ambulatory Visit (INDEPENDENT_AMBULATORY_CARE_PROVIDER_SITE_OTHER): Payer: Medicaid Other | Admitting: Orthopaedic Surgery

## 2023-02-22 DIAGNOSIS — M1611 Unilateral primary osteoarthritis, right hip: Secondary | ICD-10-CM
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
## 2023-02-23 LAB — HEMOGLOBIN A1C
Hgb A1c MFr Bld: 6.1 % of total Hgb — ABNORMAL HIGH (ref <5.7)
Mean Plasma Glucose: 128 mg/dL
eAG (mmol/L): 7.1 mmol/L

## 2023-02-23 LAB — PREALBUMIN: Prealbumin: 18 mg/dL (ref 17–34)

## 2023-02-23 NOTE — Telephone Encounter (Signed)
Lvm for patient to return call.

## 2023-03-01 ENCOUNTER — Telehealth (HOSPITAL_BASED_OUTPATIENT_CLINIC_OR_DEPARTMENT_OTHER): Payer: Self-pay | Admitting: Family Medicine

## 2023-03-01 NOTE — Telephone Encounter (Signed)
Received a msg from [place] regarding a referral to RA --per the Referrals, Dr Roda Shutters sent to [facility].   Lvm to advise of the referral to [facility], from Dr Roda Shutters and if we needed another referral to another office to please call me back

## 2023-03-07 ENCOUNTER — Other Ambulatory Visit (HOSPITAL_BASED_OUTPATIENT_CLINIC_OR_DEPARTMENT_OTHER): Payer: Self-pay

## 2023-03-07 ENCOUNTER — Telehealth (HOSPITAL_BASED_OUTPATIENT_CLINIC_OR_DEPARTMENT_OTHER): Payer: Self-pay | Admitting: Family Medicine

## 2023-03-07 DIAGNOSIS — Z794 Long term (current) use of insulin: Secondary | ICD-10-CM

## 2023-03-07 MED ORDER — LIRAGLUTIDE 18 MG/3ML ~~LOC~~ SOPN
PEN_INJECTOR | SUBCUTANEOUS | 0 refills | Status: DC
Start: 2023-03-07 — End: 2023-07-10

## 2023-03-07 NOTE — Telephone Encounter (Signed)
error 

## 2023-03-22 ENCOUNTER — Encounter (HOSPITAL_BASED_OUTPATIENT_CLINIC_OR_DEPARTMENT_OTHER): Payer: Self-pay | Admitting: Family Medicine

## 2023-03-22 ENCOUNTER — Ambulatory Visit (INDEPENDENT_AMBULATORY_CARE_PROVIDER_SITE_OTHER): Payer: Medicaid Other | Admitting: Family Medicine

## 2023-03-22 DIAGNOSIS — Z7985 Long-term (current) use of injectable non-insulin antidiabetic drugs: Secondary | ICD-10-CM | POA: Diagnosis not present

## 2023-03-22 DIAGNOSIS — Z0000 Encounter for general adult medical examination without abnormal findings: Secondary | ICD-10-CM

## 2023-03-22 DIAGNOSIS — E119 Type 2 diabetes mellitus without complications: Secondary | ICD-10-CM

## 2023-03-22 DIAGNOSIS — Z01818 Encounter for other preprocedural examination: Secondary | ICD-10-CM

## 2023-03-22 DIAGNOSIS — I10 Essential (primary) hypertension: Secondary | ICD-10-CM | POA: Diagnosis not present

## 2023-03-22 MED ORDER — BLOOD PRESSURE CUFF MISC
1.0000 | Freq: Every day | 0 refills | Status: DC
Start: 2023-03-22 — End: 2023-04-18

## 2023-03-22 NOTE — Assessment & Plan Note (Signed)
Recent hemoglobin A1c at goal at 6.1%.  She continues with liraglutide, no issues with medication.  No reported polyuria or polydipsia. Can continue with current medication regimen, no changes made today. She is up-to-date with recommended screenings

## 2023-03-22 NOTE — Progress Notes (Signed)
    Procedures performed today:    None.  Independent interpretation of notes and tests performed by another provider:   None.  Brief History, Exam, Impression, and Recommendations:    Lorraine [name] is a 62 y.o. presenting for preoperative evaluation/clearance. The planned procedure is right hip arthroplasty with the treatment goal of improved function and reduced pain. Cardiac risk for planned procedure is Intermediate (1 to 5%) - intraperitoneal or intrathoracic surgery, carotid endarterectomy, head and neck surgery, orthopedic surgery, prostate surgery  Signs or symptoms of cardiovascular disease? No New or unstable cardiopulmonary signs or symptoms? No Urinary symptoms or invasive urologic procedure? No  BP (!) 149/89 Comment: Repeat BP  Pulse (!) 49   Ht 5\' 3"  (1.6 m)   Wt 105 lb 6.4 oz (47.8 kg)   SpO2 100%   BMI 18.67 kg/m   Exam: 62 year old female in no acute distress Cardiovascular exam with regular rate and rhythm Lungs clear to auscultation bilaterally  Type 2 diabetes mellitus (HCC) Recent hemoglobin A1c at goal at 6.1%.  She continues with liraglutide, no issues with medication.  No reported polyuria or polydipsia. Can continue with current medication regimen, no changes made today. She is up-to-date with recommended screenings  Essential hypertension Patient reports that she has been doing well.  She continues with amlodipine, chlorthalidone, lisinopril as prescribed.  Blood pressure in office today is elevated, did improve slightly on recheck.  No reported issues with chest pain or headaches.  She has not been having any new lightheadedness or dizziness. Previously, blood pressure better controlled on current regimen. Can continue with current medication regimen.  Recommend intermittent monitoring of blood pressure at home, DASH diet  Preoperative clearance Based on history and exam will order the following preoperative tests: none.  Patient without any  active cardiopulmonary symptoms today.  Preoperative labs to be completed by surgeon. Patient is medically cleared to proceed with planned surgery.  Return in about 3 months (around 06/22/2023) for CPE with fasting labs 1 week prior.   ___________________________________________ Francess Mullen de [place], MD, ABFM, CAQSM Primary Care and Sports Medicine [facility]

## 2023-03-23 NOTE — Assessment & Plan Note (Signed)
Patient reports that she has been doing well.  She continues with amlodipine, chlorthalidone, lisinopril as prescribed.  Blood pressure in office today is elevated, did improve slightly on recheck.  No reported issues with chest pain or headaches.  She has not been having any new lightheadedness or dizziness. Previously, blood pressure better controlled on current regimen. Can continue with current medication regimen.  Recommend intermittent monitoring of blood pressure at home, DASH diet

## 2023-03-24 DIAGNOSIS — Z01818 Encounter for other preprocedural examination: Secondary | ICD-10-CM | POA: Insufficient documentation

## 2023-03-24 NOTE — Assessment & Plan Note (Signed)
Based on history and exam will order the following preoperative tests: none.  Patient without any active cardiopulmonary symptoms today.  Preoperative labs to be completed by surgeon. Patient is medically cleared to proceed with planned surgery.

## 2023-03-27 ENCOUNTER — Telehealth (HOSPITAL_BASED_OUTPATIENT_CLINIC_OR_DEPARTMENT_OTHER): Payer: Self-pay | Admitting: Family Medicine

## 2023-03-27 DIAGNOSIS — M059 Rheumatoid arthritis with rheumatoid factor, unspecified: Secondary | ICD-10-CM

## 2023-03-27 NOTE — Telephone Encounter (Signed)
Patient saw [facility], they referred her to RA Dr. Pollyann Savoy [address], [place], [place] [address] [phone] fax [phone]. She called them and they told her she must get referral from Dr. De Peru first since he is PCP. Please advise patient.

## 2023-04-14 ENCOUNTER — Telehealth: Payer: Self-pay | Admitting: Orthopaedic Surgery

## 2023-04-14 NOTE — Telephone Encounter (Signed)
Patient is scheduled for right total hip 05-29-23 at [facility].  Please add A1c and prealbumin screen to orders.  Patient is scheduled for PAT appointment on 05-16-23 @ 9am

## 2023-04-17 NOTE — Telephone Encounter (Signed)
done

## 2023-04-18 ENCOUNTER — Ambulatory Visit (HOSPITAL_BASED_OUTPATIENT_CLINIC_OR_DEPARTMENT_OTHER): Payer: Medicaid Other

## 2023-04-18 ENCOUNTER — Encounter (HOSPITAL_BASED_OUTPATIENT_CLINIC_OR_DEPARTMENT_OTHER): Payer: Self-pay

## 2023-04-18 DIAGNOSIS — I10 Essential (primary) hypertension: Secondary | ICD-10-CM

## 2023-04-18 MED ORDER — BLOOD PRESSURE CUFF MISC
1.0000 | Freq: Every day | 0 refills | Status: AC
Start: 2023-04-18 — End: ?

## 2023-04-18 NOTE — Progress Notes (Signed)
Patient presents in office for BP recheck, blood pressure 110/74. She is asking for [place] to resend her blood pressure monitor to be resent to her pharmacy. Has been resent. No F/U scheduled, patient will call back to schedule.

## 2023-04-20 ENCOUNTER — Other Ambulatory Visit: Payer: Self-pay

## 2023-04-27 ENCOUNTER — Other Ambulatory Visit: Payer: Self-pay | Admitting: Physician Assistant

## 2023-04-27 ENCOUNTER — Telehealth: Payer: Self-pay | Admitting: Orthopaedic Surgery

## 2023-04-27 MED ORDER — TRAMADOL HCL 50 MG PO TABS: 50.0000 mg | ORAL_TABLET | Freq: Two times a day (BID) | ORAL | 0 refills | Status: DC | PRN

## 2023-04-27 NOTE — Telephone Encounter (Signed)
Sent in tramadol

## 2023-04-27 NOTE — Telephone Encounter (Signed)
Called and notified patient.

## 2023-04-27 NOTE — Telephone Encounter (Signed)
Patient called advised she is in a lot of pain and asked if she can get something a little stronger than Aleve?   Patient said she uses [facility] [address], The number to contact patient is [phone]

## 2023-05-06 ENCOUNTER — Other Ambulatory Visit (HOSPITAL_BASED_OUTPATIENT_CLINIC_OR_DEPARTMENT_OTHER): Payer: Self-pay | Admitting: Family Medicine

## 2023-05-06 DIAGNOSIS — I10 Essential (primary) hypertension: Secondary | ICD-10-CM

## 2023-05-10 ENCOUNTER — Telehealth: Payer: Self-pay | Admitting: Orthopaedic Surgery

## 2023-05-10 NOTE — Telephone Encounter (Signed)
Received Auth,$25.00 mo, FMLA forms for pts son. To [employer].

## 2023-05-10 NOTE — Telephone Encounter (Signed)
approve

## 2023-05-10 NOTE — Telephone Encounter (Signed)
Pt is about to have THA on 10/21 and would like a Rx for a high toilet seat and a walker put in for her

## 2023-05-11 NOTE — Telephone Encounter (Signed)
Called patient. No answer. Left message that they would assess and order at the time of surgery before she leaves the hospital.

## 2023-05-15 NOTE — Pre-Procedure Instructions (Signed)
Surgical Instructions   Your procedure is scheduled on May 29, 2023. Report to [facility] Main Entrance "A" at 12:45 P.M., then check in with the Admitting office. Any questions or running late day of surgery: call [phone]  Questions prior to your surgery date: call [phone], Monday-Friday, 8am-4pm. If you experience any cold or flu symptoms such as cough, fever, chills, shortness of breath, etc. between now and your scheduled surgery, please notify us at the above number.     Remember:  Do not eat after midnight the night before your surgery  You may drink clear liquids until 12:15 PM the afternoon of your surgery.   Clear liquids allowed are: Water, Non-Citrus Juices (without pulp), Carbonated Beverages, Clear Tea, Black Coffee Only (NO MILK, CREAM OR POWDERED CREAMER of any kind), and Gatorade.  Patient Instructions  The night before surgery:  No food after midnight. ONLY clear liquids after midnight  The day of surgery (if you have diabetes): Drink ONE (1) 12 oz G2 given to you in your pre admission testing appointment by 12:15 PM the afternoon of surgery. Drink in one sitting. Do not sip.  This drink was given to you during your hospital  pre-op appointment visit.  Nothing else to drink after completing the  12 oz bottle of G2.         If you have questions, please contact your surgeon's office.     Take these medicines the morning of surgery with A SIP OF WATER: amLODipine (NORVASC)    May take these medicines IF NEEDED: fluticasone (FLONASE) nasal spray  traMADol (ULTRAM)    Please follow your surgeons instructions on if/when to stop taking your leflunomide (ARAVA) and Tocilizumab (ACTEMRA).    One week prior to surgery, STOP taking any Aspirin (unless otherwise instructed by your surgeon) Aleve, Naproxen, Ibuprofen, Motrin, Advil, Goody's, BC's, all herbal medications, fish oil, and non-prescription vitamins.   WHAT DO I DO ABOUT MY DIABETES  MEDICATION?   STOP taking your liraglutide (VICTOZA) 24 hours prior to surgery. DO NOT take any doses after 11:00 AM on October 20th.   HOW TO MANAGE YOUR DIABETES BEFORE AND AFTER SURGERY  Why is it important to control my blood sugar before and after surgery? Improving blood sugar levels before and after surgery helps healing and can limit problems. A way of improving blood sugar control is eating a healthy diet by:  Eating less sugar and carbohydrates  Increasing activity/exercise  Talking with your doctor about reaching your blood sugar goals High blood sugars (greater than 180 mg/dL) can raise your risk of infections and slow your recovery, so you will need to focus on controlling your diabetes during the weeks before surgery. Make sure that the doctor who takes care of your diabetes knows about your planned surgery including the date and location.  How do I manage my blood sugar before surgery? Check your blood sugar at least 4 times a day, starting 2 days before surgery, to make sure that the level is not too high or low.  Check your blood sugar the morning of your surgery when you wake up and every 2 hours until you get to the Short Stay unit.  If your blood sugar is less than 70 mg/dL, you will need to treat for low blood sugar: Do not take insulin. Treat a low blood sugar (less than 70 mg/dL) with  cup of clear juice (cranberry or apple), 4 glucose tablets, OR glucose gel. Recheck blood sugar in 15  minutes after treatment (to make sure it is greater than 70 mg/dL). If your blood sugar is not greater than 70 mg/dL on recheck, call [phone] for further instructions. Report your blood sugar to the short stay nurse when you get to Short Stay.  If you are admitted to the hospital after surgery: Your blood sugar will be checked by the staff and you will probably be given insulin after surgery (instead of oral diabetes medicines) to make sure you have good blood sugar  levels. The goal for blood sugar control after surgery is 80-180 mg/dL.                      Do NOT Smoke (Tobacco/Vaping) for 24 hours prior to your procedure.  If you use a CPAP at night, you may bring your mask/headgear for your overnight stay.   You will be asked to remove any contacts, glasses, piercing's, hearing aid's, dentures/partials prior to surgery. Please bring cases for these items if needed.    Patients discharged the day of surgery will not be allowed to drive home, and someone needs to stay with them for 24 hours.  SURGICAL WAITING ROOM VISITATION Patients may have no more than 2 support people in the waiting area - these visitors may rotate.   Pre-op nurse will coordinate an appropriate time for 1 ADULT support person, who may not rotate, to accompany patient in pre-op.  Children under the age of 43 must have an adult with them who is not the patient and must remain in the main waiting area with an adult.  If the patient needs to stay at the hospital during part of their recovery, the visitor guidelines for inpatient rooms apply.  Please refer to the [facility] website for the visitor guidelines for any additional information.   If you received a COVID test during your pre-op visit  it is requested that you wear a mask when out in public, stay away from anyone that may not be feeling well and notify your surgeon if you develop symptoms. If you have been in contact with anyone that has tested positive in the last 10 days please notify you surgeon.      Pre-operative 5 CHG Bathing Instructions   You can play a key role in reducing the risk of infection after surgery. Your skin needs to be as free of germs as possible. You can reduce the number of germs on your skin by washing with CHG (chlorhexidine gluconate) soap before surgery. CHG is an antiseptic soap that kills germs and continues to kill germs even after washing.   DO NOT use if you have an allergy to  chlorhexidine/CHG or antibacterial soaps. If your skin becomes reddened or irritated, stop using the CHG and notify one of our RNs at [phone].   Please shower with the CHG soap starting 4 days before surgery using the following schedule:     Please keep in mind the following:  DO NOT shave, including legs and underarms, starting the day of your first shower.   You may shave your face at any point before/day of surgery.  Place clean sheets on your bed the day you start using CHG soap. Use a clean washcloth (not used since being washed) for each shower. DO NOT sleep with pets once you start using the CHG.   CHG Shower Instructions:  Wash your face and private area with normal soap. If you choose to wash your hair, wash first with  your normal shampoo.  After you use shampoo/soap, rinse your hair and body thoroughly to remove shampoo/soap residue.  Turn the water OFF and apply about 3 tablespoons (45 ml) of CHG soap to a CLEAN washcloth.  Apply CHG soap ONLY FROM YOUR NECK DOWN TO YOUR TOES (washing for 3-5 minutes)  DO NOT use CHG soap on face, private areas, open wounds, or sores.  Pay special attention to the area where your surgery is being performed.  If you are having back surgery, having someone wash your back for you may be helpful. Wait 2 minutes after CHG soap is applied, then you may rinse off the CHG soap.  Pat dry with a clean towel  Put on clean clothes/pajamas   If you choose to wear lotion, please use ONLY the CHG-compatible lotions on the back of this paper.   Additional instructions for the day of surgery: DO NOT APPLY any lotions, deodorants, cologne, or perfumes.   Do not bring valuables to the hospital. [facility] is not responsible for any belongings/valuables. Do not wear nail polish, gel polish, artificial nails, or any other type of covering on natural nails (fingers and toes) Do not wear jewelry or makeup Put on clean/comfortable clothes.  Please brush your  teeth.  Ask your nurse before applying any prescription medications to the skin.     CHG Compatible Lotions   Aveeno Moisturizing lotion  Cetaphil Moisturizing Cream  Cetaphil Moisturizing Lotion  Clairol Herbal Essence Moisturizing Lotion, Dry Skin  Clairol Herbal Essence Moisturizing Lotion, Extra Dry Skin  Clairol Herbal Essence Moisturizing Lotion, Normal Skin  Curel Age Defying Therapeutic Moisturizing Lotion with Alpha Hydroxy  Curel Extreme Care Body Lotion  Curel Soothing Hands Moisturizing Hand Lotion  Curel Therapeutic Moisturizing Cream, Fragrance-Free  Curel Therapeutic Moisturizing Lotion, Fragrance-Free  Curel Therapeutic Moisturizing Lotion, Original Formula  Eucerin Daily Replenishing Lotion  Eucerin Dry Skin Therapy Plus Alpha Hydroxy Crme  Eucerin Dry Skin Therapy Plus Alpha Hydroxy Lotion  Eucerin Original Crme  Eucerin Original Lotion  Eucerin Plus Crme Eucerin Plus Lotion  Eucerin TriLipid Replenishing Lotion  Keri Anti-Bacterial Hand Lotion  Keri Deep Conditioning Original Lotion Dry Skin Formula Softly Scented  Keri Deep Conditioning Original Lotion, Fragrance Free Sensitive Skin Formula  Keri Lotion Fast Absorbing Fragrance Free Sensitive Skin Formula  Keri Lotion Fast Absorbing Softly Scented Dry Skin Formula  Keri Original Lotion  Keri Skin Renewal Lotion Keri Silky Smooth Lotion  Keri Silky Smooth Sensitive Skin Lotion  Nivea Body Creamy Conditioning Oil  Nivea Body Extra Enriched Lotion  Nivea Body Original Lotion  Nivea Body Sheer Moisturizing Lotion Nivea Crme  Nivea Skin Firming Lotion  NutraDerm 30 Skin Lotion  NutraDerm Skin Lotion  NutraDerm Therapeutic Skin Cream  NutraDerm Therapeutic Skin Lotion  ProShield Protective Hand Cream  Provon moisturizing lotion  Please read over the following fact sheets that you were given.

## 2023-05-16 ENCOUNTER — Other Ambulatory Visit: Payer: Self-pay

## 2023-05-16 ENCOUNTER — Encounter (HOSPITAL_COMMUNITY): Payer: Self-pay

## 2023-05-16 ENCOUNTER — Encounter (HOSPITAL_COMMUNITY)
Admission: RE | Admit: 2023-05-16 | Discharge: 2023-05-16 | Disposition: A | Discharge status: Home / Self Care | Payer: Medicaid Other | Source: Ambulatory Visit | Attending: Orthopaedic Surgery | Admitting: Orthopaedic Surgery

## 2023-05-16 ENCOUNTER — Other Ambulatory Visit: Payer: Self-pay | Admitting: Orthopaedic Surgery

## 2023-05-16 VITALS — BP 127/77 | HR 59 | Temp 97.8°F | Resp 16 | Ht 63.0 in | Wt 106.2 lb

## 2023-05-16 DIAGNOSIS — F17200 Nicotine dependence, unspecified, uncomplicated: Secondary | ICD-10-CM | POA: Insufficient documentation

## 2023-05-16 DIAGNOSIS — F1910 Other psychoactive substance abuse, uncomplicated: Secondary | ICD-10-CM | POA: Insufficient documentation

## 2023-05-16 DIAGNOSIS — E876 Hypokalemia: Secondary | ICD-10-CM

## 2023-05-16 DIAGNOSIS — Z01818 Encounter for other preprocedural examination: Secondary | ICD-10-CM

## 2023-05-16 DIAGNOSIS — K219 Gastro-esophageal reflux disease without esophagitis: Secondary | ICD-10-CM | POA: Insufficient documentation

## 2023-05-16 DIAGNOSIS — M1611 Unilateral primary osteoarthritis, right hip: Secondary | ICD-10-CM

## 2023-05-16 DIAGNOSIS — E119 Type 2 diabetes mellitus without complications: Secondary | ICD-10-CM

## 2023-05-16 DIAGNOSIS — I10 Essential (primary) hypertension: Secondary | ICD-10-CM | POA: Insufficient documentation

## 2023-05-16 DIAGNOSIS — Z794 Long term (current) use of insulin: Secondary | ICD-10-CM | POA: Diagnosis not present

## 2023-05-16 LAB — COMPREHENSIVE METABOLIC PANEL
ALT: 33 U/L (ref 0–44)
AST: 148 U/L — ABNORMAL HIGH (ref 15–41)
Albumin: 3.8 g/dL (ref 3.5–5.0)
Alkaline Phosphatase: 50 U/L (ref 38–126)
Anion gap: 11 (ref 5–15)
BUN: 11 mg/dL (ref 8–23)
CO2: 28 mmol/L (ref 22–32)
Calcium: 9.4 mg/dL (ref 8.9–10.3)
Chloride: 96 mmol/L — ABNORMAL LOW (ref 98–111)
Creatinine, Ser: 0.71 mg/dL (ref 0.44–1.00)
GFR, Estimated: >60 mL/min (ref >60)
Glucose, Bld: 84 mg/dL (ref 70–99)
Potassium: 2.6 mmol/L — CL (ref 3.5–5.1)
Sodium: 135 mmol/L (ref 135–145)
Total Bilirubin: 0.7 mg/dL (ref 0.3–1.2)
Total Protein: 6.8 g/dL (ref 6.5–8.1)

## 2023-05-16 LAB — TYPE AND SCREEN
ABO/RH(D): A POS
Antibody Screen: NEGATIVE

## 2023-05-16 LAB — CBC
HCT: 42.3 % (ref 36.0–46.0)
Hemoglobin: 14.3 g/dL (ref 12.0–15.0)
MCH: 30.4 pg (ref 26.0–34.0)
MCHC: 33.8 g/dL (ref 30.0–36.0)
MCV: 90 fL (ref 80.0–100.0)
Platelets: 335 10*3/uL (ref 150–400)
RBC: 4.7 MIL/uL (ref 3.87–5.11)
RDW: 13.1 % (ref 11.5–15.5)
WBC: 8.6 10*3/uL (ref 4.0–10.5)
nRBC: 0 % (ref 0.0–0.2)

## 2023-05-16 LAB — HEMOGLOBIN A1C
Hgb A1c MFr Bld: 6.1 % — ABNORMAL HIGH (ref 4.8–5.6)
Mean Plasma Glucose: 128.37 mg/dL

## 2023-05-16 LAB — SURGICAL PCR SCREEN
MRSA, PCR: NEGATIVE
Staphylococcus aureus: POSITIVE — AB

## 2023-05-16 LAB — PREALBUMIN: Prealbumin: 26 mg/dL (ref 18–38)

## 2023-05-16 LAB — GLUCOSE, CAPILLARY: Glucose-Capillary: 123 mg/dL — ABNORMAL HIGH (ref 70–99)

## 2023-05-16 MED ORDER — POTASSIUM CHLORIDE ER 10 MEQ PO TBCR
20.0000 meq | EXTENDED_RELEASE_TABLET | Freq: Two times a day (BID) | ORAL | 2 refills | Status: DC
Start: 2023-05-16 — End: 2024-03-25

## 2023-05-16 NOTE — Progress Notes (Signed)
Per Dr. Roda Shutters, pt does not need to hold her Arava prior to surgery. Pt can NOT have anymore doses of her Tocilizumab (ACTEMRA) injection starting TODAY, May 16, 2023. Pt given these instructions on paper and verbally at pre-op appointment and pt verbalized understanding.

## 2023-05-16 NOTE — Progress Notes (Signed)
I sent potassium chloride supplement.  Please let her know to start taking it immediately.  If it doesn't come up, anesthesia will cancel the surgery.

## 2023-05-16 NOTE — Progress Notes (Addendum)
PCP - Dr. Ceasar Mons [place] Cardiologist - Denies  PPM/ICD - Denies Device Orders - n/a Rep Notified - n/a  Chest x-ray - 01/12/2023 EKG - 05/16/2023 Stress Test - Denies ECHO - Denies Cardiac Cath - Denies  Sleep Study - Denies CPAP - n/a  Pt is DM2. She checks her blood sugar 2x/week. Normal fasting range is around 100. CBG at pre-op 123. A1c result pending.  Last dose of GLP1 agonist- Pts last dose of Victoza was today, October 8th. GLP1 instructions: Pt instructed to hold Victoza 24 hrs prior to surgery. She is not to take any doses after 1100 October 20th and none day of surgery.  Blood Thinner Instructions: n/a Aspirin Instructions: n/a  ERAS Protcol - Clear liquids until 1215 day of surgery PRE-SURGERY Ensure or G2- G2 given to pt with instructions  COVID TEST- n/a   Anesthesia review: Yes. Critical potassium level of 2.6. Dr. Roda Shutters notified.  Pt did test positive for TB blood test May 2024. Pt MD notes, patient completed treatment for previous TB positive result and will not be treated for this latent result.  Patient denies shortness of breath, fever, cough and chest pain at PAT appointment. Pt denies any respiratory illness/infection in the last two months.   All instructions explained to the patient, with a verbal understanding of the material. Patient agrees to go over the instructions while at home for a better understanding. Patient also instructed to self quarantine after being tested for COVID-19. The opportunity to ask questions was provided.

## 2023-05-16 NOTE — Progress Notes (Signed)
Dr. Roda Shutters made aware of critical potassium of 2.6. MD said he will order medication for pt.

## 2023-05-17 ENCOUNTER — Telehealth: Payer: Self-pay

## 2023-05-17 NOTE — Anesthesia Preprocedure Evaluation (Addendum)
Anesthesia Evaluation  Patient identified by MRN, date of birth, ID band Patient awake    Reviewed: Allergy & Precautions, NPO status , Patient's Chart, lab work & pertinent test results  History of Anesthesia Complications Negative for: history of anesthetic complications  Airway Mallampati: II  TM Distance: >3 FB Neck ROM: Full    Dental  (+) Edentulous Upper, Missing,    Pulmonary Current Smoker and Patient abstained from smoking.   Pulmonary exam normal        Cardiovascular hypertension, Pt. on medications Normal cardiovascular exam     Neuro/Psych negative neurological ROS  negative psych ROS   GI/Hepatic ,GERD  ,,(+) Hepatitis -, C, B  Endo/Other  diabetes, Type 2, Oral Hypoglycemic Agents    Renal/GU negative Renal ROS  negative genitourinary   Musculoskeletal negative musculoskeletal ROS (+)    Abdominal   Peds  Hematology negative hematology ROS (+)   Anesthesia Other Findings Day of surgery medications reviewed with patient.  Reproductive/Obstetrics negative OB ROS                             Anesthesia Physical Anesthesia Plan  ASA: 2  Anesthesia Plan: Spinal   Post-op Pain Management:  Regional for Post-op pain and Tylenol PO (pre-op)*   Induction:   PONV Risk Score and Plan: 3 and Treatment may vary due to age or medical condition, Ondansetron, Propofol infusion, Dexamethasone and Midazolam  Airway Management Planned: Natural Airway and Simple Face Mask  Additional Equipment: None  Intra-op Plan:   Post-operative Plan:   Informed Consent: I have reviewed the patients History and Physical, chart, labs and discussed the procedure including the risks, benefits and alternatives for the proposed anesthesia with the patient or authorized representative who has indicated his/her understanding and acceptance.       Plan Discussed with: CRNA  Anesthesia Plan  Comments: (PAT note written 05/17/2023 by Shonna Chock, PA-C.  )       Anesthesia Quick Evaluation

## 2023-05-17 NOTE — Telephone Encounter (Signed)
Called patient. No answer. LMOM that potassium is low. Supplement has been sent to pharmacy. She needs to start taking this immediately or else surgery could be cancelled. Ask her to call back if she has any questions.
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
## 2023-05-24 ENCOUNTER — Other Ambulatory Visit: Payer: Self-pay | Admitting: Physician Assistant

## 2023-05-24 MED ORDER — DOCUSATE SODIUM 100 MG PO CAPS: 100.0000 mg | ORAL_CAPSULE | Freq: Every day | ORAL | 2 refills | Status: AC | PRN

## 2023-05-24 MED ORDER — OXYCODONE-ACETAMINOPHEN 5-325 MG PO TABS
1.0000 | ORAL_TABLET | Freq: Four times a day (QID) | ORAL | 0 refills | Status: DC | PRN
Start: 2023-05-24 — End: 2023-06-27

## 2023-05-24 MED ORDER — ONDANSETRON HCL 4 MG PO TABS: 4.0000 mg | ORAL_TABLET | Freq: Three times a day (TID) | ORAL | 0 refills | Status: DC | PRN

## 2023-05-24 MED ORDER — ASPIRIN 81 MG PO TBEC: 81.0000 mg | DELAYED_RELEASE_TABLET | Freq: Two times a day (BID) | ORAL | 0 refills | Status: DC | PRN

## 2023-05-24 MED ORDER — DOXYCYCLINE HYCLATE 100 MG PO CAPS: 100.0000 mg | ORAL_CAPSULE | Freq: Two times a day (BID) | ORAL | 0 refills | Status: DC

## 2023-05-25 ENCOUNTER — Telehealth: Payer: Self-pay | Admitting: Orthopaedic Surgery

## 2023-05-25 NOTE — Telephone Encounter (Signed)
Patient called regarding checking her potassium levels before surgery 10/21 with Dr. Roda Shutters.

## 2023-05-26 MED ORDER — TRANEXAMIC ACID 1000 MG/10ML IV SOLN
2000.0000 mg | INTRAVENOUS | Status: AC
  Filled 2023-05-26: qty 20

## 2023-05-29 ENCOUNTER — Other Ambulatory Visit: Payer: Self-pay

## 2023-05-29 ENCOUNTER — Encounter (HOSPITAL_COMMUNITY): Payer: Self-pay | Admitting: Orthopaedic Surgery

## 2023-05-29 ENCOUNTER — Ambulatory Visit (HOSPITAL_COMMUNITY): Payer: Medicaid Other

## 2023-05-29 ENCOUNTER — Ambulatory Visit (HOSPITAL_COMMUNITY): Payer: Medicaid Other | Admitting: Anesthesiology

## 2023-05-29 ENCOUNTER — Observation Stay (HOSPITAL_COMMUNITY): Payer: Medicaid Other

## 2023-05-29 ENCOUNTER — Ambulatory Visit (HOSPITAL_COMMUNITY): Payer: Medicaid Other | Admitting: Vascular Surgery

## 2023-05-29 ENCOUNTER — Other Ambulatory Visit: Payer: Self-pay | Admitting: Physician Assistant

## 2023-05-29 ENCOUNTER — Encounter (HOSPITAL_COMMUNITY)
Admission: RE | Disposition: A | Discharge status: Home / Self Care | Payer: Self-pay | Source: Home / Self Care | Attending: Orthopaedic Surgery

## 2023-05-29 ENCOUNTER — Observation Stay (HOSPITAL_COMMUNITY)
Admission: RE | Admit: 2023-05-29 | Discharge: 2023-05-30 | Disposition: A | Discharge status: Home / Self Care | Payer: Medicaid Other | Attending: Orthopaedic Surgery | Admitting: Orthopaedic Surgery

## 2023-05-29 DIAGNOSIS — E876 Hypokalemia: Secondary | ICD-10-CM

## 2023-05-29 DIAGNOSIS — E119 Type 2 diabetes mellitus without complications: Secondary | ICD-10-CM | POA: Diagnosis not present

## 2023-05-29 DIAGNOSIS — Z79899 Other long term (current) drug therapy: Secondary | ICD-10-CM | POA: Diagnosis not present

## 2023-05-29 DIAGNOSIS — Z794 Long term (current) use of insulin: Secondary | ICD-10-CM | POA: Insufficient documentation

## 2023-05-29 DIAGNOSIS — F17210 Nicotine dependence, cigarettes, uncomplicated: Secondary | ICD-10-CM | POA: Insufficient documentation

## 2023-05-29 DIAGNOSIS — Z7982 Long term (current) use of aspirin: Secondary | ICD-10-CM | POA: Diagnosis not present

## 2023-05-29 DIAGNOSIS — Z96651 Presence of right artificial knee joint: Secondary | ICD-10-CM

## 2023-05-29 DIAGNOSIS — M1611 Unilateral primary osteoarthritis, right hip: Secondary | ICD-10-CM | POA: Diagnosis present

## 2023-05-29 DIAGNOSIS — Z01818 Encounter for other preprocedural examination: Principal | ICD-10-CM

## 2023-05-29 DIAGNOSIS — I10 Essential (primary) hypertension: Secondary | ICD-10-CM | POA: Diagnosis not present

## 2023-05-29 HISTORY — PX: TOTAL HIP ARTHROPLASTY: SHX124

## 2023-05-29 LAB — GLUCOSE, CAPILLARY
Glucose-Capillary: 75 mg/dL (ref 70–99)
Glucose-Capillary: 216 mg/dL — ABNORMAL HIGH (ref 70–99)

## 2023-05-29 LAB — POCT I-STAT, CHEM 8
BUN: 12 mg/dL (ref 8–23)
Calcium, Ion: 1.19 mmol/L (ref 1.15–1.40)
Chloride: 99 mmol/L (ref 98–111)
Creatinine, Ser: 0.6 mg/dL (ref 0.44–1.00)
Glucose, Bld: 112 mg/dL — ABNORMAL HIGH (ref 70–99)
HCT: 41 % (ref 36.0–46.0)
Hemoglobin: 13.9 g/dL (ref 12.0–15.0)
Potassium: 3.3 mmol/L — ABNORMAL LOW (ref 3.5–5.1)
Sodium: 138 mmol/L (ref 135–145)
TCO2: 25 mmol/L (ref 22–32)

## 2023-05-29 LAB — ABO/RH: ABO/RH(D): A POS

## 2023-05-29 SURGERY — ARTHROPLASTY, HIP, TOTAL, ANTERIOR APPROACH
Anesthesia: Spinal | Site: Hip | Laterality: Right

## 2023-05-29 MED ORDER — DIPHENHYDRAMINE HCL 12.5 MG/5ML PO ELIX: 25.0000 mg | ORAL_SOLUTION | ORAL | Status: DC | PRN

## 2023-05-29 MED ORDER — CHLORHEXIDINE GLUCONATE 0.12 % MT SOLN
15.0000 mL | Freq: Once | OROMUCOSAL | Status: AC
Start: 2023-05-29 — End: 2023-05-29
  Administered 2023-05-29: 15 mL via OROMUCOSAL
  Filled 2023-05-29: qty 15

## 2023-05-29 MED ORDER — PROPOFOL 1000 MG/100ML IV EMUL
INTRAVENOUS | Status: AC
  Filled 2023-05-29: qty 200

## 2023-05-29 MED ORDER — AMLODIPINE BESYLATE 10 MG PO TABS: 10.0000 mg | ORAL_TABLET | Freq: Every day | ORAL | Status: DC

## 2023-05-29 MED ORDER — SORBITOL 70 % SOLN
30.0000 mL | Freq: Every day | Status: DC | PRN
Start: 2023-05-29 — End: 2023-05-30

## 2023-05-29 MED ORDER — PRONTOSAN WOUND IRRIGATION OPTIME
TOPICAL | Status: DC | PRN
  Administered 2023-05-29: 450 mL

## 2023-05-29 MED ORDER — LIRAGLUTIDE 18 MG/3ML ~~LOC~~ SOPN: 1.2000 mg | PEN_INJECTOR | Freq: Every evening | SUBCUTANEOUS | Status: DC

## 2023-05-29 MED ORDER — DROPERIDOL 2.5 MG/ML IJ SOLN: 0.6250 mg | Freq: Once | INTRAMUSCULAR | Status: DC | PRN

## 2023-05-29 MED ORDER — MIDAZOLAM HCL 2 MG/2ML IJ SOLN
INTRAMUSCULAR | Status: DC | PRN
  Administered 2023-05-29: 1 mg via INTRAVENOUS

## 2023-05-29 MED ORDER — CEFAZOLIN SODIUM-DEXTROSE 2-4 GM/100ML-% IV SOLN
2.0000 g | INTRAVENOUS | Status: AC
  Administered 2023-05-29: 2 g via INTRAVENOUS
  Filled 2023-05-29: qty 100

## 2023-05-29 MED ORDER — BUPIVACAINE-MELOXICAM ER 400-12 MG/14ML IJ SOLN
INTRAMUSCULAR | Status: AC
  Filled 2023-05-29: qty 1

## 2023-05-29 MED ORDER — DOXYCYCLINE HYCLATE 100 MG PO TABS
100.0000 mg | ORAL_TABLET | Freq: Two times a day (BID) | ORAL | Status: DC
  Administered 2023-05-30: 100 mg via ORAL
  Filled 2023-05-29: qty 1

## 2023-05-29 MED ORDER — FENTANYL CITRATE (PF) 250 MCG/5ML IJ SOLN
INTRAMUSCULAR | Status: AC
  Filled 2023-05-29: qty 5

## 2023-05-29 MED ORDER — OXYCODONE HCL 5 MG PO TABS
5.0000 mg | ORAL_TABLET | ORAL | Status: DC | PRN
  Administered 2023-05-30 (×2): 5 mg via ORAL
  Filled 2023-05-29 (×2): qty 1

## 2023-05-29 MED ORDER — METOCLOPRAMIDE HCL 5 MG PO TABS: 5.0000 mg | ORAL_TABLET | Freq: Three times a day (TID) | ORAL | Status: DC | PRN

## 2023-05-29 MED ORDER — ORAL CARE MOUTH RINSE
15.0000 mL | Freq: Once | OROMUCOSAL | Status: AC
Start: 2023-05-29 — End: 2023-05-29

## 2023-05-29 MED ORDER — LACTATED RINGERS IV SOLN: INTRAVENOUS | Status: DC

## 2023-05-29 MED ORDER — METHOCARBAMOL 500 MG PO TABS
ORAL_TABLET | ORAL | Status: AC
  Filled 2023-05-29: qty 1

## 2023-05-29 MED ORDER — METHOCARBAMOL 500 MG PO TABS
500.0000 mg | ORAL_TABLET | Freq: Four times a day (QID) | ORAL | Status: DC | PRN
  Administered 2023-05-29 – 2023-05-30 (×2): 500 mg via ORAL
  Filled 2023-05-29 (×2): qty 1

## 2023-05-29 MED ORDER — PHENYLEPHRINE 80 MCG/ML (10ML) SYRINGE FOR IV PUSH (FOR BLOOD PRESSURE SUPPORT)
PREFILLED_SYRINGE | INTRAVENOUS | Status: DC | PRN
  Administered 2023-05-29 (×4): 80 ug via INTRAVENOUS

## 2023-05-29 MED ORDER — MAGNESIUM CITRATE PO SOLN: 1.0000 | Freq: Once | ORAL | Status: DC | PRN

## 2023-05-29 MED ORDER — TRANEXAMIC ACID 1000 MG/10ML IV SOLN
INTRAVENOUS | Status: DC | PRN
  Administered 2023-05-29: 2000 mg via TOPICAL

## 2023-05-29 MED ORDER — CEFAZOLIN SODIUM-DEXTROSE 2-4 GM/100ML-% IV SOLN
2.0000 g | Freq: Four times a day (QID) | INTRAVENOUS | Status: AC
  Administered 2023-05-29 – 2023-05-30 (×2): 2 g via INTRAVENOUS
  Filled 2023-05-29 (×2): qty 100

## 2023-05-29 MED ORDER — ACETAMINOPHEN 325 MG PO TABS: 325.0000 mg | ORAL_TABLET | Freq: Four times a day (QID) | ORAL | Status: DC | PRN

## 2023-05-29 MED ORDER — ASPIRIN 81 MG PO TBEC: 81.0000 mg | DELAYED_RELEASE_TABLET | Freq: Two times a day (BID) | ORAL | 0 refills | Status: AC

## 2023-05-29 MED ORDER — ASPIRIN 81 MG PO CHEW
81.0000 mg | CHEWABLE_TABLET | Freq: Two times a day (BID) | ORAL | Status: DC
  Administered 2023-05-29 – 2023-05-30 (×2): 81 mg via ORAL
  Filled 2023-05-29 (×2): qty 1

## 2023-05-29 MED ORDER — ACETAMINOPHEN 500 MG PO TABS
1000.0000 mg | ORAL_TABLET | Freq: Four times a day (QID) | ORAL | Status: DC
  Administered 2023-05-29 – 2023-05-30 (×3): 1000 mg via ORAL
  Filled 2023-05-29 (×3): qty 2

## 2023-05-29 MED ORDER — VANCOMYCIN HCL 1000 MG IV SOLR
INTRAVENOUS | Status: AC
  Filled 2023-05-29: qty 20

## 2023-05-29 MED ORDER — ONDANSETRON HCL 4 MG PO TABS: 4.0000 mg | ORAL_TABLET | Freq: Four times a day (QID) | ORAL | Status: DC | PRN

## 2023-05-29 MED ORDER — CHLORTHALIDONE 25 MG PO TABS
25.0000 mg | ORAL_TABLET | Freq: Every evening | ORAL | Status: DC
  Administered 2023-05-29: 25 mg via ORAL
  Filled 2023-05-29: qty 1

## 2023-05-29 MED ORDER — PROPOFOL 500 MG/50ML IV EMUL
INTRAVENOUS | Status: DC | PRN
  Administered 2023-05-29: 20 mg via INTRAVENOUS
  Administered 2023-05-29: 10 mg via INTRAVENOUS
  Administered 2023-05-29: 100 ug/kg/min via INTRAVENOUS
  Administered 2023-05-29 (×2): 30 mg via INTRAVENOUS

## 2023-05-29 MED ORDER — BUPIVACAINE IN DEXTROSE 0.75-8.25 % IT SOLN
INTRATHECAL | Status: DC | PRN
  Administered 2023-05-29: 1.6 mL via INTRATHECAL

## 2023-05-29 MED ORDER — HYDROXYZINE HCL 50 MG/ML IM SOLN
50.0000 mg | Freq: Four times a day (QID) | INTRAMUSCULAR | Status: DC | PRN
  Administered 2023-05-30: 50 mg via INTRAMUSCULAR
  Filled 2023-05-29: qty 1

## 2023-05-29 MED ORDER — PHENYLEPHRINE HCL-NACL 20-0.9 MG/250ML-% IV SOLN
INTRAVENOUS | Status: DC | PRN
  Administered 2023-05-29: 30 ug/min via INTRAVENOUS

## 2023-05-29 MED ORDER — TRANEXAMIC ACID-NACL 1000-0.7 MG/100ML-% IV SOLN
1000.0000 mg | Freq: Once | INTRAVENOUS | Status: AC
  Administered 2023-05-29: 1000 mg via INTRAVENOUS
  Filled 2023-05-29: qty 100

## 2023-05-29 MED ORDER — METOCLOPRAMIDE HCL 5 MG/ML IJ SOLN: 5.0000 mg | Freq: Three times a day (TID) | INTRAMUSCULAR | Status: DC | PRN

## 2023-05-29 MED ORDER — PANTOPRAZOLE SODIUM 40 MG PO TBEC
40.0000 mg | DELAYED_RELEASE_TABLET | Freq: Every day | ORAL | Status: DC
  Administered 2023-05-29 – 2023-05-30 (×2): 40 mg via ORAL
  Filled 2023-05-29 (×2): qty 1

## 2023-05-29 MED ORDER — MENTHOL 3 MG MT LOZG: 1.0000 | LOZENGE | OROMUCOSAL | Status: DC | PRN

## 2023-05-29 MED ORDER — TRANEXAMIC ACID-NACL 1000-0.7 MG/100ML-% IV SOLN
1000.0000 mg | INTRAVENOUS | Status: AC
  Administered 2023-05-29: 1000 mg via INTRAVENOUS
  Filled 2023-05-29: qty 100

## 2023-05-29 MED ORDER — MIDAZOLAM HCL 2 MG/2ML IJ SOLN
INTRAMUSCULAR | Status: AC
  Filled 2023-05-29: qty 2

## 2023-05-29 MED ORDER — OXYCODONE HCL 5 MG PO TABS
ORAL_TABLET | ORAL | Status: AC
  Filled 2023-05-29: qty 2

## 2023-05-29 MED ORDER — METHOCARBAMOL 1000 MG/10ML IJ SOLN: 500.0000 mg | Freq: Four times a day (QID) | INTRAMUSCULAR | Status: DC | PRN

## 2023-05-29 MED ORDER — POLYETHYLENE GLYCOL 3350 17 G PO PACK
17.0000 g | PACK | Freq: Every day | ORAL | Status: DC
  Administered 2023-05-29: 17 g via ORAL
  Filled 2023-05-29: qty 1

## 2023-05-29 MED ORDER — DEXAMETHASONE SODIUM PHOSPHATE 10 MG/ML IJ SOLN
10.0000 mg | Freq: Once | INTRAMUSCULAR | Status: AC
  Administered 2023-05-30: 10 mg via INTRAVENOUS
  Filled 2023-05-29: qty 1

## 2023-05-29 MED ORDER — ACETAMINOPHEN 500 MG PO TABS: 1000.0000 mg | ORAL_TABLET | Freq: Once | ORAL | Status: DC

## 2023-05-29 MED ORDER — ALUM & MAG HYDROXIDE-SIMETH 200-200-20 MG/5ML PO SUSP: 30.0000 mL | ORAL | Status: DC | PRN

## 2023-05-29 MED ORDER — OXYCODONE HCL 5 MG PO TABS
10.0000 mg | ORAL_TABLET | ORAL | Status: DC | PRN
  Administered 2023-05-29: 10 mg via ORAL

## 2023-05-29 MED ORDER — BUPIVACAINE-MELOXICAM ER 400-12 MG/14ML IJ SOLN
INTRAMUSCULAR | Status: DC | PRN
  Administered 2023-05-29: 400 mg

## 2023-05-29 MED ORDER — 0.9 % SODIUM CHLORIDE (POUR BTL) OPTIME
TOPICAL | Status: DC | PRN
  Administered 2023-05-29: 1000 mL

## 2023-05-29 MED ORDER — ONDANSETRON HCL 4 MG/2ML IJ SOLN
4.0000 mg | Freq: Four times a day (QID) | INTRAMUSCULAR | Status: DC | PRN
  Administered 2023-05-29: 4 mg via INTRAVENOUS
  Filled 2023-05-29: qty 2

## 2023-05-29 MED ORDER — HYDROMORPHONE HCL 1 MG/ML IJ SOLN: 0.2500 mg | INTRAMUSCULAR | Status: DC | PRN

## 2023-05-29 MED ORDER — SODIUM CHLORIDE 0.9 % IR SOLN
Status: DC | PRN
  Administered 2023-05-29: 1000 mL

## 2023-05-29 MED ORDER — POVIDONE-IODINE 10 % EX SWAB
2.0000 | Freq: Once | CUTANEOUS | Status: AC
  Administered 2023-05-29: 2 via TOPICAL

## 2023-05-29 MED ORDER — HYDROMORPHONE HCL 1 MG/ML IJ SOLN: 0.5000 mg | INTRAMUSCULAR | Status: DC | PRN

## 2023-05-29 MED ORDER — LISINOPRIL 20 MG PO TABS
40.0000 mg | ORAL_TABLET | Freq: Every evening | ORAL | Status: DC
  Administered 2023-05-29: 40 mg via ORAL
  Filled 2023-05-29: qty 2

## 2023-05-29 MED ORDER — DOCUSATE SODIUM 100 MG PO CAPS
100.0000 mg | ORAL_CAPSULE | Freq: Two times a day (BID) | ORAL | Status: DC
  Administered 2023-05-29 – 2023-05-30 (×2): 100 mg via ORAL
  Filled 2023-05-29 (×2): qty 1

## 2023-05-29 MED ORDER — VANCOMYCIN HCL 1 G IV SOLR
INTRAVENOUS | Status: DC | PRN
  Administered 2023-05-29: 1000 mg via TOPICAL

## 2023-05-29 MED ORDER — LEFLUNOMIDE 10 MG PO TABS
10.0000 mg | ORAL_TABLET | Freq: Every day | ORAL | Status: DC
  Administered 2023-05-29 – 2023-05-30 (×2): 10 mg via ORAL
  Filled 2023-05-29 (×2): qty 1

## 2023-05-29 MED ORDER — FERROUS SULFATE 325 (65 FE) MG PO TABS
325.0000 mg | ORAL_TABLET | Freq: Three times a day (TID) | ORAL | Status: DC
  Administered 2023-05-30: 325 mg via ORAL
  Filled 2023-05-29: qty 1

## 2023-05-29 MED ORDER — PHENOL 1.4 % MT LIQD: 1.0000 | OROMUCOSAL | Status: DC | PRN

## 2023-05-29 MED ORDER — ONDANSETRON HCL 4 MG/2ML IJ SOLN
INTRAMUSCULAR | Status: DC | PRN
  Administered 2023-05-29: 4 mg via INTRAVENOUS

## 2023-05-29 MED ORDER — INSULIN ASPART 100 UNIT/ML IJ SOLN
0.0000 [IU] | INTRAMUSCULAR | Status: DC | PRN
Start: 2023-05-29 — End: 2023-05-29

## 2023-05-29 SURGICAL SUPPLY — 63 items
ADH SKN CLS LQ APL DERMABOND (GAUZE/BANDAGES/DRESSINGS) ×1
BAG COUNTER SPONGE SURGICOUNT (BAG) ×1 IMPLANT
BAG DECANTER FOR FLEXI CONT (MISCELLANEOUS) ×1 IMPLANT
BAG SPNG CNTER NS LX DISP (BAG) ×1
BLADE SAG 18X100X1.27 (BLADE) ×1 IMPLANT
COVER PERINEAL POST (MISCELLANEOUS) ×1 IMPLANT
COVER SURGICAL LIGHT HANDLE (MISCELLANEOUS) ×1 IMPLANT
CUP SECTOR GRIPTON 50MM (Cup) IMPLANT
DERMABOND ADVANCED .7 DNX6 (GAUZE/BANDAGES/DRESSINGS) IMPLANT
DRAPE C-ARM 42X72 X-RAY (DRAPES) ×1 IMPLANT
DRAPE POUCH INSTRU U-SHP 10X18 (DRAPES) ×1 IMPLANT
DRAPE STERI IOBAN 125X83 (DRAPES) ×1 IMPLANT
DRAPE U-SHAPE 47X51 STRL (DRAPES) ×2 IMPLANT
DRSG AQUACEL AG ADV 3.5X10 (GAUZE/BANDAGES/DRESSINGS) ×1 IMPLANT
DURAPREP 26ML APPLICATOR (WOUND CARE) ×2 IMPLANT
ELECT BLADE 4.0 EZ CLEAN MEGAD (MISCELLANEOUS) ×1
ELECT REM PT RETURN 9FT ADLT (ELECTROSURGICAL) ×1
ELECTRODE BLDE 4.0 EZ CLN MEGD (MISCELLANEOUS) ×1 IMPLANT
ELECTRODE REM PT RTRN 9FT ADLT (ELECTROSURGICAL) ×1 IMPLANT
GLOVE BIOGEL PI IND STRL 7.0 (GLOVE) ×2 IMPLANT
GLOVE BIOGEL PI IND STRL 7.5 (GLOVE) ×5 IMPLANT
GLOVE ECLIPSE 7.0 STRL STRAW (GLOVE) ×2 IMPLANT
GLOVE SKINSENSE STRL SZ7.5 (GLOVE) ×1 IMPLANT
GLOVE SURG SYN 7.5 E (GLOVE) ×2 IMPLANT
GLOVE SURG SYN 7.5 PF PI (GLOVE) ×2 IMPLANT
GLOVE SURG UNDER POLY LF SZ7 (GLOVE) ×3 IMPLANT
GLOVE SURG UNDER POLY LF SZ7.5 (GLOVE) ×2 IMPLANT
GOWN STRL REUS W/ TWL LRG LVL3 (GOWN DISPOSABLE) IMPLANT
GOWN STRL REUS W/ TWL XL LVL3 (GOWN DISPOSABLE) ×1 IMPLANT
GOWN STRL REUS W/TWL LRG LVL3 (GOWN DISPOSABLE)
GOWN STRL REUS W/TWL XL LVL3 (GOWN DISPOSABLE) ×1
GOWN STRL SURGICAL XL XLNG (GOWN DISPOSABLE) ×1 IMPLANT
GOWN TOGA ZIPPER T7+ PEEL AWAY (MISCELLANEOUS) ×2 IMPLANT
HANDPIECE INTERPULSE COAX TIP (DISPOSABLE) ×1
HEAD FEMORAL 32 CERAMIC (Hips) IMPLANT
HOOD PEEL AWAY T7 (MISCELLANEOUS) ×1 IMPLANT
IV NS IRRIG 3000ML ARTHROMATIC (IV SOLUTION) ×1 IMPLANT
KIT BASIN OR (CUSTOM PROCEDURE TRAY) ×1 IMPLANT
LINER ACET PNNCL PLUS4 NEUTRAL (Hips) IMPLANT
MARKER SKIN DUAL TIP RULER LAB (MISCELLANEOUS) ×1 IMPLANT
NDL SPNL 18GX3.5 QUINCKE PK (NEEDLE) ×1 IMPLANT
NEEDLE SPNL 18GX3.5 QUINCKE PK (NEEDLE) ×1 IMPLANT
PACK TOTAL JOINT (CUSTOM PROCEDURE TRAY) ×1 IMPLANT
PACK UNIVERSAL I (CUSTOM PROCEDURE TRAY) ×1 IMPLANT
PINNACLE PLUS 4 NEUTRAL (Hips) ×1 IMPLANT
SET HNDPC FAN SPRY TIP SCT (DISPOSABLE) ×1 IMPLANT
SOLUTION PRONTOSAN WOUND 350ML (IRRIGATION / IRRIGATOR) ×1 IMPLANT
STAPLER VISISTAT 35W (STAPLE) IMPLANT
STEM FEM ACTIS STD SZ4 (Stem) IMPLANT
SUT ETHIBOND 2 V 37 (SUTURE) ×1 IMPLANT
SUT VIC AB 0 CT1 27 (SUTURE) ×1
SUT VIC AB 0 CT1 27XBRD ANBCTR (SUTURE) ×1 IMPLANT
SUT VIC AB 1 CTX 36 (SUTURE) ×1
SUT VIC AB 1 CTX36XBRD ANBCTR (SUTURE) ×1 IMPLANT
SUT VIC AB 2-0 CT1 27 (SUTURE) ×2
SUT VIC AB 2-0 CT1 TAPERPNT 27 (SUTURE) ×2 IMPLANT
SYR 50ML LL SCALE MARK (SYRINGE) ×1 IMPLANT
TOWEL GREEN STERILE (TOWEL DISPOSABLE) ×1 IMPLANT
TRAY CATH INTERMITTENT SS 16FR (CATHETERS) IMPLANT
TRAY FOLEY W/BAG SLVR 16FR (SET/KITS/TRAYS/PACK)
TRAY FOLEY W/BAG SLVR 16FR ST (SET/KITS/TRAYS/PACK) IMPLANT
TUBE SUCT ARGYLE STRL (TUBING) ×1 IMPLANT
YANKAUER SUCT BULB TIP NO VENT (SUCTIONS) ×1 IMPLANT

## 2023-05-29 NOTE — Transfer of Care (Signed)
Immediate Anesthesia Transfer of Care Note  Patient: Lorraine [name]  Procedure(s) Performed: TOTAL HIP ARTHROPLASTY ANTERIOR APPROACH (Right: Hip)  Patient Location: PACU  Anesthesia Type:MAC  Level of Consciousness: awake and patient cooperative  Airway & Oxygen Therapy: Patient Spontanous Breathing and Patient connected to face mask oxygen  Post-op Assessment: Report given to RN and Post -op Vital signs reviewed and stable  Post vital signs: Reviewed and stable  Last Vitals:  Vitals Value Taken Time  BP 119/68 05/29/23 1610  Temp    Pulse 137 05/29/23 1614  Resp 13 05/29/23 1614  SpO2 96% 05/29/23 1614  Vitals shown include unfiled device data.  Last Pain:  Vitals:   05/29/23 1308  TempSrc:   PainSc: 9          Complications: No notable events documented.

## 2023-05-29 NOTE — H&P (Signed)
PREOPERATIVE H&P  Chief Complaint: RIGHT HIP OSTEOARTHRITIS  HPI: Lorraine [name] is a 62 y.o. female who presents for surgical treatment of RIGHT HIP OSTEOARTHRITIS.  She denies any changes in medical history.  Past Surgical History:  Procedure Laterality Date   BREAST EXCISIONAL BIOPSY Left    BREAST EXCISIONAL BIOPSY Right    COLONOSCOPY  2022   FRACTURE SURGERY Left    Pinky Toe Fracture Surgery   SPINE SURGERY  01/11/2001   microdiskectomy L5-S1   VAGINAL HYSTERECTOMY  2000   Social History   Socioeconomic History   Marital status: Single    Spouse name: Not on file   Number of children: Not on file   Years of education: 11   Highest education level: Not on file  Occupational History    Employer: UNEMPLOYED    Comment: used to work at [employer]  Tobacco Use   Smoking status: Every Day    Current packs/day: 0.00    Types: Cigarettes    Last attempt to quit: 01/30/2017    Years since quitting: 6.3   Smokeless tobacco: Never   Tobacco comments:    1 -2 cigs with cravings  Vaping Use   Vaping status: Never Used  Substance and Sexual Activity   Alcohol use: Not Currently    Comment: occasional    Drug use: No   Sexual activity: Yes    Partners: Male    Birth control/protection: Other-see comments    Comment: given condoms  Other Topics Concern   Not on file  Social History Narrative   Financial assistance approved for 100% discount at [facility] and has [facility] card per [employer]   03/04/2010   Currently not working because of back pain.   Married with current partner since atleast 6 years and has 1 daughter.   Patient's daughter's phone number [phone]   Social Determinants of Health   Financial Resource Strain: Not on file  Food Insecurity: Not on file  Transportation Needs: Not on file  Physical Activity: Not on file  Stress: Not on file  Social Connections: Not on file   Family History  Problem Relation Age of Onset   Colon cancer Father         colon cancer at age <75   Diabetes Mother    Diabetes Brother    Esophageal cancer Neg Hx    Rectal cancer Neg Hx    Stomach cancer Neg Hx    Heart disease Neg Hx    No Known Allergies Prior to Admission medications   Medication Sig Start Date End Date Taking? Authorizing Provider  chlorthalidone (HYGROTON) 25 MG tablet TAKE 1 TABLET BY MOUTH DAILY Patient taking differently: Take 25 mg by mouth every evening. 10/27/22  Yes de [place], Buren Kos, MD  fluticasone (FLONASE) 50 MCG/ACT nasal spray PLACE 2 SPRAYS INTO BOTH NOSTRILS DAILY. Patient taking differently: Place 2 sprays into both nostrils daily as needed for allergies. 01/11/22  Yes Carlisle Beers, FNP  leflunomide (ARAVA) 10 MG tablet Take 10 mg by mouth in the morning. 01/12/23  Yes [provider]  liraglutide (VICTOZA) 18 MG/3ML SOPN DIAL AND INJECT UNDER THE SKIN 1.2 MG DAILY Patient taking differently: Inject 1.2 mg into the skin every evening. 03/07/23  Yes de [place], Raymond J, MD  lisinopril (ZESTRIL) 40 MG tablet Take 1 tablet (40 mg total) by mouth daily. Patient taking differently: Take 40 mg by mouth every evening. 11/11/22 08/08/23 Yes de [place], Raymond  J, MD  Multiple Vitamin (MULTIVITAMIN WITH MINERALS) TABS tablet Take 1 tablet by mouth every evening.   Yes [provider]  Tocilizumab (ACTEMRA) 162 MG/0.9ML SOSY Inject 162 mg into the skin every 14 (fourteen) days.   Yes [provider]  traMADol (ULTRAM) 50 MG tablet Take 1 tablet (50 mg total) by mouth every 12 (twelve) hours as needed. 04/27/23  Yes Cristie Hem, PA-C  vitamin B-12 (CYANOCOBALAMIN) 1000 MCG tablet Take 1 tablet (1,000 mcg total) by mouth daily. Patient taking differently: Take 1,000 mcg by mouth daily as needed (LOW ENERGY). 03/13/19  Yes Seawell, Jaimie A, DO  amLODipine (NORVASC) 10 MG tablet TAKE 1 TABLET BY MOUTH DAILY 05/08/23   de [place], Buren Kos, MD  aspirin EC 81 MG tablet Take 1 tablet (81 mg total) by mouth 2 (two)  times daily. To be taken after surgery to prevent blood clots 05/29/23 05/28/24  Cristie Hem, PA-C  Blood Glucose Monitoring Suppl (ACCU-CHEK GUIDE) w/Device KIT Use to check blood sugar as instructed up to 3 times a day 08/17/21   Gwenevere Abbot, MD  Blood Pressure Monitoring (BLOOD PRESSURE CUFF) MISC 1 each by Does not apply route daily. Check BP once daily 04/18/23   de [place], Buren Kos, MD  docusate sodium (COLACE) 100 MG capsule Take 1 capsule (100 mg total) by mouth daily as needed. 05/24/23 05/23/24  Cristie Hem, PA-C  doxycycline (VIBRAMYCIN) 100 MG capsule Take 1 capsule (100 mg total) by mouth 2 (two) times daily. 05/24/23   Cristie Hem, PA-C  glucose blood (IGLUCOSE TEST STRIPS) test strip Use as instructed 10/25/21   Evlyn Kanner, MD  Insulin Pen Needle (DROPLET PEN NEEDLES) 32G X 4 MM MISC USE ONCE A WEEK AS DIRECTED BY DOCTOR 01/04/23   de [place], Buren Kos, MD  Lancets MISC 1 applicator by Does not apply route daily. 10/25/21   Evlyn Kanner, MD  ondansetron (ZOFRAN) 4 MG tablet Take 1 tablet (4 mg total) by mouth every 8 (eight) hours as needed for nausea or vomiting. 05/24/23   Cristie Hem, PA-C  oxyCODONE-acetaminophen (PERCOCET) 5-325 MG tablet Take 1-2 tablets by mouth every 6 (six) hours as needed. To be taken after surgery 05/24/23   Cristie Hem, PA-C  potassium chloride (KLOR-CON) 10 MEQ tablet Take 2 tablets (20 mEq total) by mouth 2 (two) times daily. 05/16/23   Tarry Kos, MD     Positive ROS: All other systems have been reviewed and were otherwise negative with the exception of those mentioned in the HPI and as above.  Physical Exam: General: Alert, no acute distress Cardiovascular: No pedal edema Respiratory: No cyanosis, no use of accessory musculature GI: abdomen soft Skin: No lesions in the area of chief complaint Neurologic: Sensation intact distally Psychiatric: Patient is competent for consent with normal mood and affect Lymphatic:  no lymphedema  MUSCULOSKELETAL: exam stable  Assessment: RIGHT HIP OSTEOARTHRITIS  Plan: Plan for Procedure(s): TOTAL HIP ARTHROPLASTY ANTERIOR APPROACH  The risks benefits and alternatives were discussed with the patient including but not limited to the risks of nonoperative treatment, versus surgical intervention including infection, bleeding, nerve injury,  blood clots, cardiopulmonary complications, morbidity, mortality, among others, and they were willing to proceed.   Glee Arvin, MD 05/29/2023 12:51 PM

## 2023-05-29 NOTE — Op Note (Signed)
TOTAL HIP ARTHROPLASTY ANTERIOR APPROACH  Procedure Note Lorraine [name]   [id number]  Pre-op Diagnosis: RIGHT HIP DEGENERATIVE JOINT DISEASE     Post-op Diagnosis: same  Operative Findings Protrusio  Complete loss of joint space   Operative Procedures  1. Total hip replacement; Right hip; uncemented [id number]   Surgeon: Gershon Mussel, M.D.  Assist: Oneal Grout, PA-C   Anesthesia: spinal  Prosthesis: Depuy Acetabulum: Pinnacle 50 mm Femur: Actis 4 STD Head: 32 mm size: +1 Liner: +4 Bearing Type: ceramic/poly  Total Hip Arthroplasty (Anterior Approach) Op Note:  After informed consent was obtained and the operative extremity marked in the holding area, the patient was brought back to the operating room and placed supine on the HANA table. Next, the operative extremity was prepped and draped in normal sterile fashion. Surgical timeout occurred verifying patient identification, surgical site, surgical procedure and administration of antibiotics.  A 10 cm longitudinal incision was made starting from 2 fingerbreadths lateral and inferior to the ASIS towards the lateral aspect of the patella.  A Hueter approach to the hip was performed, using the interval between tensor fascia lata and sartorius.  Dissection was carried bluntly down onto the anterior hip capsule. The lateral femoral circumflex vessels were identified and coagulated. A capsulotomy was performed and the capsular flaps tagged for later repair.  The neck osteotomy was performed. The femoral head was removed which showed severe wear, the acetabular rim was cleared of soft tissue and osteophytes and attention was turned to reaming the acetabulum.  Sequential reaming was performed under fluoroscopic guidance down to the floor of the cotyloid fossa. We reamed to a size 49 mm.  The acetabulum was then thoroughly irrigated.  A subchondral cyst in the superior acetabulum was curetted out and packed with bone graft from the  reamings, and then impacted the acetabular shell.   The liner was then placed after irrigation and attention turned to the femur.  After placing the femoral hook, the leg was taken to externally rotated, extended and adducted position taking care to perform soft tissue releases to allow for adequate mobilization of the femur. Soft tissue was cleared from the shoulder of the greater trochanter and the hook elevator used to improve exposure of the proximal femur. Sequential broaching performed up to a size 4. Trial neck and head were placed. The leg was brought back up to neutral and the construct reduced.  Antibiotic irrigation was placed in the surgical wound.  The position and sizing of components, offset and leg lengths were checked using fluoroscopy. Stability of the construct was checked in extension and external rotation without any subluxation, shuck or impingement of prosthesis. We dislocated the prosthesis, dropped the leg back into position, removed trial components, and irrigated copiously. The final stem and head was then placed, the leg brought back up, the system reduced and fluoroscopy used to verify positioning.  We irrigated, obtained hemostasis and closed the capsule using #2 ethibond suture.  One gram of vancomycin powder was placed in the surgical bed.   One gram of topical tranexamic acid was injected into the joint.  The fascia was closed with #1 vicryl plus, the deep fat layer was closed with 0 vicryl, the subcutaneous layers closed with 2.0 Vicryl Plus and the skin closed with 2.0 nylon and dermabond. A sterile dressing was applied. The patient was awakened in the operating room and taken to recovery in stable condition.  All sponge, needle, and instrument counts were correct at the  end of the case.   Tessa Lerner, my PA, was a medical necessity for opening, closing, limb positioning, retracting, exposing, and overall facilitation and timely completion of the surgery.  Position:  supine  Complications: see description of procedure.  Time Out: performed   Drains/Packing: none  Estimated blood loss: see anesthesia record  Returned to Recovery Room: in good condition.   Antibiotics: yes   Mechanical VTE (DVT) Prophylaxis: sequential compression devices, TED thigh-high  Chemical VTE (DVT) Prophylaxis: aspirin   Fluid Replacement: see anesthesia record  Specimens Removed: 1 to pathology   Sponge and Instrument Count Correct? yes   PACU: portable radiograph - low AP   Plan/RTC: Return in 2 weeks for staple removal. Weight Bearing/Load Lower Extremity: full  Hip precautions: none Suture Removal: 2 weeks   N. Glee Arvin, MD Aldean Baker 3:41 PM   Implant Name Type Inv. Item Serial No. Manufacturer Lot No. LRB No. Used Action  CUP SECTOR GRIPTON - [id number] Cup CUP SECTOR GRIPTON  [employer] [id number] Right 1 Implanted  PINNACLE PLUS 4 NEUTRAL - [id number] Hips PINNACLE PLUS 4 NEUTRAL  [employer] ORTH[employer] [id number] Right 1 Implanted  STEM FEM ACTIS STD SZ4 - [id number] Stem STEM FEM ACTIS STD SZ4  [employer]TH[employer] [id number] Right 1 Implanted  HEAD FEMORAL 32 CERAMIC - [id number] Hips HEAD FEMORAL 32 CERAMIC  [employer] [id number] Right 1 Implanted

## 2023-05-29 NOTE — Discharge Instructions (Signed)

## 2023-05-30 ENCOUNTER — Encounter (HOSPITAL_COMMUNITY): Payer: Self-pay | Admitting: Orthopaedic Surgery

## 2023-05-30 ENCOUNTER — Other Ambulatory Visit: Payer: Self-pay | Admitting: Physician Assistant

## 2023-05-30 DIAGNOSIS — M1611 Unilateral primary osteoarthritis, right hip: Secondary | ICD-10-CM | POA: Diagnosis not present

## 2023-05-30 DIAGNOSIS — Z96641 Presence of right artificial hip joint: Secondary | ICD-10-CM

## 2023-05-30 LAB — GLUCOSE, CAPILLARY: Glucose-Capillary: 96 mg/dL (ref 70–99)

## 2023-05-30 NOTE — Progress Notes (Signed)
Patient alert and oriented, voiding adequately, skin clean, dry and intact without evidence of skin break down, or symptoms of complications - no redness or edema noted, only slight tenderness at site.  Patient states pain is manageable at time of discharge. Patient has an appointment with MD in 2 weeks 

## 2023-05-30 NOTE — Evaluation (Signed)
Physical Therapy Evaluation Patient Details Name: Lorraine [name] MRN: [id number] DOB: 1960-10-19 Today's Date: 05/30/2023  History of Present Illness  62 y.o. female presents to [facility] hospital on 05/29/2023 for elective R THA. PMH includes HTN, DMII, GERD, RA,HLD, and fibromyalgia. (Simultaneous filing. User may not have seen previous data.)  Clinical Impression  Pt presents to therapy with deficits in strength, balance, ROM, and mobility in the RLE.Pt required verbal cues for step sequencing during stair training. Pt was able to tolerate ambulate household distances with no increase in pain with use of a RW. Pt will continue to benefit from skilled PT to address remaining functional deficits. PT recommends discharge home when medically appropriate.       If plan is discharge home, recommend the following: A little help with walking and/or transfers;A little help with bathing/dressing/bathroom;Assistance with cooking/housework;Assist for transportation   Can travel by private vehicle        Equipment Recommendations Rolling walker (2 wheels);BSC/3in1  Recommendations for Other Services       Functional Status Assessment Patient has had a recent decline in their functional status and demonstrates the ability to make significant improvements in function in a reasonable and predictable amount of time.     Precautions / Restrictions Precautions Precautions: Fall;Other (comment) (Direct anterior hip approach. No hip precautions in orders.) Precaution Booklet Issued: Yes (comment) Precaution Comments: Reviewed THA exercise booklet Restrictions Weight Bearing Restrictions: Yes RLE Weight Bearing: Weight bearing as tolerated      Mobility  Bed Mobility Overal bed mobility: Independent                  Transfers Overall transfer level: Needs assistance Equipment used: Rolling walker (2 wheels) Transfers: Sit to/from Stand Sit to Stand: Contact guard assist                 Ambulation/Gait Ambulation/Gait assistance: Contact guard assist Gait Distance (Feet): 150 Feet Assistive device: Rolling walker (2 wheels) Gait Pattern/deviations: Step-to pattern, Decreased stride length Gait velocity: slow Gait velocity interpretation: <1.8 ft/sec, indicate of risk for recurrent falls      Stairs Stairs: Yes Stairs assistance: Contact guard assist Stair Management: One rail Right, Step to pattern Number of Stairs: 5 General stair comments: Verbal cues used for step sequencing  Wheelchair Mobility     Tilt Bed    Modified Rankin (Stroke Patients Only)       Balance Overall balance assessment: Needs assistance Sitting-balance support: Single extremity supported Sitting balance-Leahy Scale: Fair     Standing balance support: Bilateral upper extremity supported Standing balance-Leahy Scale: Poor                               Pertinent Vitals/Pain Pain Assessment Pain Assessment: 0-10 Pain Score: 9  Pain Location: R knee Pain Descriptors / Indicators: Aching Pain Intervention(s): Monitored during session    Home Living Family/patient expects to be discharged to:: Private residence Living Arrangements: Children Available Help at Discharge: Family;Available 24 hours/day Type of Home: House Home Access: Level entry     Alternate Level Stairs-Number of Steps: 5+5 Home Layout: Two level Home Equipment: None Additional Comments: Bedside commode and RW has been ordered    Prior Function Prior Level of Function : Independent/Modified Independent                     Extremity/Trunk Assessment   Upper Extremity Assessment Upper Extremity Assessment: Overall  WFL for tasks assessed    Lower Extremity Assessment Lower Extremity Assessment: RLE deficits/detail RLE Deficits / Details: generalized weakness RLE: Unable to fully assess due to pain RLE Sensation: WNL RLE Coordination: WNL    Cervical / Trunk  Assessment Cervical / Trunk Assessment: Normal  Communication   Communication Communication: No apparent difficulties Cueing Techniques: Verbal cues;Tactile cues  Cognition Arousal: Alert Behavior During Therapy: WFL for tasks assessed/performed Overall Cognitive Status: Within Functional Limits for tasks assessed                                          General Comments      Exercises     Assessment/Plan    PT Assessment Patient needs continued PT services  PT Problem List Decreased strength;Decreased range of motion;Decreased balance;Decreased mobility;Pain;Decreased activity tolerance       PT Treatment Interventions Gait training;Stair training;Functional mobility training;Therapeutic activities;Balance training;Therapeutic exercise    PT Goals (Current goals can be found in the Care Plan section)  Acute Rehab PT Goals Patient Stated Goal: To return home PT Goal Formulation: With patient/family Time For Goal Achievement: 06/13/23 Potential to Achieve Goals: Good    Frequency Min 1X/week     Co-evaluation               AM-PAC PT "6 Clicks" Mobility  Outcome Measure Help needed turning from your back to your side while in a flat bed without using bedrails?: None Help needed moving from lying on your back to sitting on the side of a flat bed without using bedrails?: None Help needed moving to and from a bed to a chair (including a wheelchair)?: A Little Help needed standing up from a chair using your arms (e.g., wheelchair or bedside chair)?: A Little Help needed to walk in hospital room?: A Little Help needed climbing 3-5 steps with a railing? : A Little 6 Click Score: 20    End of Session Equipment Utilized During Treatment: Gait belt Activity Tolerance: Patient tolerated treatment well Patient left: in chair;with call bell/phone within reach;with family/visitor present Nurse Communication: Mobility status PT Visit Diagnosis: Other  abnormalities of gait and mobility (R26.89);Muscle weakness (generalized) (M62.81);Pain Pain - Right/Left: Right Pain - part of body: Hip    Time: 1610-9604 PT Time Calculation (min) (ACUTE ONLY): 22 min   Charges:   PT Evaluation $PT Eval Low Complexity: 1 Low   PT General Charges $$ ACUTE PT VISIT: 1 Visit         Caryl Comes, SPT Acute Rehab Office: [phone]  Caryl Comes 05/30/2023, 9:52 AM
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
## 2023-05-30 NOTE — Anesthesia Postprocedure Evaluation (Signed)
Anesthesia Post Note  Patient: Lorraine [name]  Procedure(s) Performed: TOTAL HIP ARTHROPLASTY ANTERIOR APPROACH (Right: Hip)     Patient location during evaluation: PACU Anesthesia Type: Spinal Level of consciousness: awake and alert Pain management: pain level controlled Vital Signs Assessment: post-procedure vital signs reviewed and stable Respiratory status: spontaneous breathing, nonlabored ventilation and respiratory function stable Cardiovascular status: blood pressure returned to baseline and stable Postop Assessment: no apparent nausea or vomiting Anesthetic complications: no   No notable events documented.  Last Vitals:  Vitals:   05/30/23 0041 05/30/23 0417  BP: 104/66 (!) 99/59  Pulse: (!) 57 (!) 58  Resp: 18 18  Temp: 36.5 C 36.8 C  SpO2: 100% 100%    Last Pain:  Vitals:   05/30/23 0649  TempSrc:   PainSc: 5                  Lowella Curb

## 2023-05-30 NOTE — Progress Notes (Signed)
Subjective: 1 Day Post-Op Procedure(s) (LRB): TOTAL HIP ARTHROPLASTY ANTERIOR APPROACH (Right) Patient reports pain as mild.    Objective: Vital signs in last 24 hours: Temp:  [97.7 F (36.5 C)-98.3 F (36.8 C)] 97.8 F (36.6 C) (10/22 0731) Pulse Rate:  [55-69] 69 (10/22 0731) Resp:  [12-20] 20 (10/22 0731) BP: (99-132)/(59-89) 105/73 (10/22 0731) SpO2:  [95 %-100 %] 100 % (10/22 0731) Weight:  [52.2 kg] 52.2 kg (10/21 1301)  Intake/Output from previous day: 10/21 0701 - 10/22 0700 In: 400 [I.V.:400] Out: 925 [Urine:925] Intake/Output this shift: No intake/output data recorded.  Recent Labs    05/29/23 1310  HGB 13.9   Recent Labs    05/29/23 1310  HCT 41.0   Recent Labs    05/29/23 1310  NA 138  K 3.3*  CL 99  BUN 12  CREATININE 0.60  GLUCOSE 112*   No results for input(s): "LABPT", "INR" in the last 72 hours.  Neurologically intact Neurovascular intact Sensation intact distally Intact pulses distally Dorsiflexion/Plantar flexion intact Incision: dressing C/D/I No cellulitis present Compartment soft   Assessment/Plan: 1 Day Post-Op Procedure(s) (LRB): TOTAL HIP ARTHROPLASTY ANTERIOR APPROACH (Right) Advance diet Up with therapy D/C IV fluids Discharge home with home health once cleared by PT WBAT RLE      Cristie Hem 05/30/2023, 7:50 AM
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
## 2023-06-02 ENCOUNTER — Other Ambulatory Visit: Payer: Self-pay

## 2023-06-02 ENCOUNTER — Ambulatory Visit: Payer: Medicaid Other | Attending: Family Medicine

## 2023-06-02 DIAGNOSIS — R2689 Other abnormalities of gait and mobility: Secondary | ICD-10-CM | POA: Insufficient documentation

## 2023-06-02 DIAGNOSIS — M25551 Pain in right hip: Secondary | ICD-10-CM | POA: Insufficient documentation

## 2023-06-02 DIAGNOSIS — M6281 Muscle weakness (generalized): Secondary | ICD-10-CM | POA: Insufficient documentation

## 2023-06-06 ENCOUNTER — Ambulatory Visit: Payer: Medicaid Other | Admitting: Physical Therapy

## 2023-06-06 ENCOUNTER — Encounter: Payer: Self-pay | Admitting: Physical Therapy

## 2023-06-06 DIAGNOSIS — M25551 Pain in right hip: Secondary | ICD-10-CM | POA: Diagnosis not present

## 2023-06-06 DIAGNOSIS — M6281 Muscle weakness (generalized): Secondary | ICD-10-CM

## 2023-06-06 DIAGNOSIS — R2689 Other abnormalities of gait and mobility: Secondary | ICD-10-CM
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
## 2023-06-13 ENCOUNTER — Ambulatory Visit: Payer: Medicaid Other | Attending: Physician Assistant

## 2023-06-13 DIAGNOSIS — R2689 Other abnormalities of gait and mobility: Secondary | ICD-10-CM | POA: Insufficient documentation

## 2023-06-13 DIAGNOSIS — M25551 Pain in right hip: Secondary | ICD-10-CM | POA: Diagnosis present

## 2023-06-13 DIAGNOSIS — M6281 Muscle weakness (generalized): Secondary | ICD-10-CM | POA: Insufficient documentation

## 2023-06-14 ENCOUNTER — Encounter: Payer: Self-pay | Admitting: Orthopaedic Surgery

## 2023-06-14 ENCOUNTER — Ambulatory Visit (INDEPENDENT_AMBULATORY_CARE_PROVIDER_SITE_OTHER): Payer: Medicaid Other | Admitting: Physician Assistant

## 2023-06-14 DIAGNOSIS — Z96641 Presence of right artificial hip joint: Secondary | ICD-10-CM

## 2023-06-14 MED ORDER — METHOCARBAMOL 750 MG PO TABS: 750.0000 mg | ORAL_TABLET | Freq: Two times a day (BID) | ORAL | 2 refills | Status: DC | PRN

## 2023-06-14 MED ORDER — OXYCODONE-ACETAMINOPHEN 5-325 MG PO TABS: 1.0000 | ORAL_TABLET | Freq: Three times a day (TID) | ORAL | 0 refills | Status: DC | PRN

## 2023-06-14 MED ORDER — FLUCONAZOLE 150 MG PO TABS: 150.0000 mg | ORAL_TABLET | Freq: Once | ORAL | 0 refills | Status: AC
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
## 2023-06-14 NOTE — Addendum Note (Signed)
Addended by: Cristie Hem on: 06/14/2023 10:47 AM   Modules accepted: Orders

## 2023-06-15 ENCOUNTER — Ambulatory Visit: Payer: Medicaid Other
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
## 2023-06-20 ENCOUNTER — Telehealth: Payer: Self-pay | Admitting: Orthopaedic Surgery

## 2023-06-20 ENCOUNTER — Ambulatory Visit: Payer: Medicaid Other

## 2023-06-20 ENCOUNTER — Other Ambulatory Visit: Payer: Self-pay | Admitting: Physician Assistant

## 2023-06-20 DIAGNOSIS — R2689 Other abnormalities of gait and mobility: Secondary | ICD-10-CM

## 2023-06-20 DIAGNOSIS — M25551 Pain in right hip: Secondary | ICD-10-CM

## 2023-06-20 DIAGNOSIS — M6281 Muscle weakness (generalized): Secondary | ICD-10-CM

## 2023-06-20 MED ORDER — ONDANSETRON HCL 4 MG PO TABS: 4.0000 mg | ORAL_TABLET | Freq: Three times a day (TID) | ORAL | 0 refills | Status: DC | PRN

## 2023-06-20 NOTE — Telephone Encounter (Signed)
Notified patient. She said that the pain medicines are making her sick.

## 2023-06-20 NOTE — Telephone Encounter (Signed)
Patient called and said she needs a refill on her medication for vomiting. CB#[phone]

## 2023-06-20 NOTE — Telephone Encounter (Signed)
Sent to pharmacy on file.  What is making her sick?
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
## 2023-06-22 ENCOUNTER — Ambulatory Visit: Payer: Medicaid Other

## 2023-06-22 DIAGNOSIS — M25551 Pain in right hip: Secondary | ICD-10-CM

## 2023-06-22 DIAGNOSIS — R2689 Other abnormalities of gait and mobility: Secondary | ICD-10-CM

## 2023-06-22 DIAGNOSIS — M6281 Muscle weakness (generalized): Secondary | ICD-10-CM

## 2023-06-27 ENCOUNTER — Encounter (HOSPITAL_BASED_OUTPATIENT_CLINIC_OR_DEPARTMENT_OTHER): Payer: Self-pay | Admitting: Family Medicine

## 2023-06-27 ENCOUNTER — Ambulatory Visit (INDEPENDENT_AMBULATORY_CARE_PROVIDER_SITE_OTHER): Payer: Medicaid Other | Admitting: Family Medicine

## 2023-06-27 VITALS — BP 115/76 | HR 64 | Temp 98.2°F | Ht 63.0 in | Wt 106.0 lb

## 2023-06-27 DIAGNOSIS — E119 Type 2 diabetes mellitus without complications: Secondary | ICD-10-CM | POA: Diagnosis not present

## 2023-06-27 DIAGNOSIS — Z0000 Encounter for general adult medical examination without abnormal findings: Secondary | ICD-10-CM | POA: Diagnosis not present

## 2023-06-27 DIAGNOSIS — Z7985 Long-term (current) use of injectable non-insulin antidiabetic drugs: Secondary | ICD-10-CM | POA: Diagnosis not present
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
## 2023-06-27 NOTE — Patient Instructions (Signed)
  Medication Instructions:  Your physician recommends that you continue on your current medications as directed. Please refer to the Current Medication list given to you today. --If you need a refill on any your medications before your next appointment, please call your pharmacy first. If no refills are authorized on file call the office.-- Lab Work: Your physician has recommended that you have lab work today: yes If you have labs (blood work) drawn today and your tests are completely normal, you will receive your results via MyChart message OR a phone call from our staff.  Please ensure you check your voicemail in the event that you authorized detailed messages to be left on a delegated number. If you have any lab test that is abnormal or we need to change your treatment, we will call you to review the results.  Referrals/Procedures/Imaging: no  Follow-Up: Your next appointment:   Your physician recommends that you schedule a follow-up appointment in: 4 months with Dr. de Peru  You will receive a text message or e-mail with a link to a survey about your care and experience with [place] today! We would greatly appreciate your feedback!   Thanks for letting us be apart of your health journey!!  Primary Care and Sports Medicine   Dr. Ceasar Mons Peru   We encourage you to activate your patient portal called "MyChart".  Sign up information is provided on this After Visit Summary.  MyChart is used to connect with patients for Virtual Visits (Telemedicine).  Patients are able to view lab/test results, encounter notes, upcoming appointments, etc.  Non-urgent messages can be sent to your provider as well. To learn more about what you can do with MyChart, please visit --  [email or web address].

## 2023-06-27 NOTE — Assessment & Plan Note (Signed)
Routine HCM labs ordered. HCM reviewed/discussed. Anticipatory guidance regarding healthy weight, lifestyle and choices given. Recommend healthy diet.  Recommend approximately 150 minutes/week of moderate intensity exercise Recommend regular dental and vision exams Always use seatbelt/lap and shoulder restraints Recommend using smoke alarms and checking batteries at least twice a year Recommend using sunscreen when outside Discussed colon cancer screening recommendations, options.  Patient is UTD Discussed recommendations for shingles vaccine.  Patient has completed this at [facility] Discussed tetanus immunization recommendations, patient is UTD
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
## 2023-06-28 ENCOUNTER — Ambulatory Visit: Payer: Medicaid Other

## 2023-06-28 DIAGNOSIS — M6281 Muscle weakness (generalized): Secondary | ICD-10-CM

## 2023-06-28 DIAGNOSIS — M25551 Pain in right hip: Secondary | ICD-10-CM

## 2023-06-28 DIAGNOSIS — R2689 Other abnormalities of gait and mobility: Secondary | ICD-10-CM

## 2023-06-28 LAB — COMPREHENSIVE METABOLIC PANEL
ALT: 9 [IU]/L (ref 0–32)
AST: 108 [IU]/L — ABNORMAL HIGH (ref 0–40)
Albumin: 4 g/dL (ref 3.9–4.9)
Alkaline Phosphatase: 73 [IU]/L (ref 44–121)
BUN/Creatinine Ratio: 11 — ABNORMAL LOW (ref 12–28)
BUN: 7 mg/dL — ABNORMAL LOW (ref 8–27)
Bilirubin Total: <0.2 mg/dL (ref 0.0–1.2)
CO2: 21 mmol/L (ref 20–29)
Calcium: 9.5 mg/dL (ref 8.7–10.3)
Chloride: 103 mmol/L (ref 96–106)
Creatinine, Ser: 0.66 mg/dL (ref 0.57–1.00)
Globulin, Total: 2.8 g/dL (ref 1.5–4.5)
Glucose: 82 mg/dL (ref 70–99)
Potassium: 3.8 mmol/L (ref 3.5–5.2)
Sodium: 140 mmol/L (ref 134–144)
Total Protein: 6.8 g/dL (ref 6.0–8.5)
eGFR: 98 mL/min/{1.73_m2} (ref >59)

## 2023-06-28 LAB — CBC WITH DIFFERENTIAL/PLATELET
Basophils Absolute: 0 10*3/uL (ref 0.0–0.2)
Basos: 0 %
EOS (ABSOLUTE): 0.1 10*3/uL (ref 0.0–0.4)
Eos: 2 %
Hematocrit: 40.2 % (ref 34.0–46.6)
Hemoglobin: 12.7 g/dL (ref 11.1–15.9)
Immature Grans (Abs): 0 10*3/uL (ref 0.0–0.1)
Immature Granulocytes: 0 %
Lymphocytes Absolute: 1.2 10*3/uL (ref 0.7–3.1)
Lymphs: 26 %
MCH: 30.3 pg (ref 26.6–33.0)
MCHC: 31.6 g/dL (ref 31.5–35.7)
MCV: 96 fL (ref 79–97)
Monocytes Absolute: 0.4 10*3/uL (ref 0.1–0.9)
Monocytes: 8 %
Neutrophils Absolute: 3.1 10*3/uL (ref 1.4–7.0)
Neutrophils: 64 %
Platelets: 356 10*3/uL (ref 150–450)
RBC: 4.19 x10E6/uL (ref 3.77–5.28)
RDW: 12.6 % (ref 11.7–15.4)
WBC: 4.8 10*3/uL (ref 3.4–10.8)

## 2023-06-28 LAB — MICROALBUMIN / CREATININE URINE RATIO
Creatinine, Urine: 297.1 mg/dL
Microalb/Creat Ratio: 12 mg/g{creat} (ref 0–29)
Microalbumin, Urine: 35.1 ug/mL

## 2023-06-28 LAB — LIPID PANEL
Chol/HDL Ratio: 2.9 ratio (ref 0.0–4.4)
Cholesterol, Total: 179 mg/dL (ref 100–199)
HDL: 61 mg/dL (ref >39)
LDL Chol Calc (NIH): 101 mg/dL — ABNORMAL HIGH (ref 0–99)
Triglycerides: 93 mg/dL (ref 0–149)
VLDL Cholesterol Cal: 17 mg/dL (ref 5–40)

## 2023-06-28 LAB — TSH RFX ON ABNORMAL TO FREE T4: TSH: 0.909 u[IU]/mL (ref 0.450–4.500)

## 2023-06-29 ENCOUNTER — Encounter (HOSPITAL_BASED_OUTPATIENT_CLINIC_OR_DEPARTMENT_OTHER): Payer: Self-pay | Admitting: Family Medicine

## 2023-07-09 ENCOUNTER — Other Ambulatory Visit (HOSPITAL_BASED_OUTPATIENT_CLINIC_OR_DEPARTMENT_OTHER): Payer: Self-pay | Admitting: Family Medicine

## 2023-07-09 DIAGNOSIS — E119 Type 2 diabetes mellitus without complications: Secondary | ICD-10-CM
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
## 2023-07-11 ENCOUNTER — Ambulatory Visit: Payer: Medicaid Other

## 2023-07-12 ENCOUNTER — Other Ambulatory Visit (INDEPENDENT_AMBULATORY_CARE_PROVIDER_SITE_OTHER): Payer: Medicaid Other

## 2023-07-12 ENCOUNTER — Ambulatory Visit: Payer: Medicaid Other | Admitting: Orthopaedic Surgery

## 2023-07-12 DIAGNOSIS — Z96641 Presence of right artificial hip joint: Secondary | ICD-10-CM
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
## 2023-07-17 ENCOUNTER — Other Ambulatory Visit (HOSPITAL_BASED_OUTPATIENT_CLINIC_OR_DEPARTMENT_OTHER): Payer: Self-pay | Admitting: Family Medicine

## 2023-07-17 DIAGNOSIS — I10 Essential (primary) hypertension: Secondary | ICD-10-CM
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
## 2023-07-20 ENCOUNTER — Ambulatory Visit: Payer: Medicaid Other

## 2023-07-20 ENCOUNTER — Telehealth: Payer: Self-pay

## 2023-07-20 NOTE — Telephone Encounter (Signed)
TC due to missed visit.  Spoke directly to patient who stated MD had released her from PT due to excellent progress.

## 2023-07-26 ENCOUNTER — Ambulatory Visit: Payer: Medicaid Other

## 2023-07-26 ENCOUNTER — Encounter (HOSPITAL_BASED_OUTPATIENT_CLINIC_OR_DEPARTMENT_OTHER): Payer: Self-pay | Admitting: *Deleted

## 2023-07-26 LAB — HM DIABETES EYE EXAM

## 2023-07-27 ENCOUNTER — Encounter (HOSPITAL_BASED_OUTPATIENT_CLINIC_OR_DEPARTMENT_OTHER): Payer: Self-pay | Admitting: *Deleted

## 2023-07-28 ENCOUNTER — Encounter (HOSPITAL_BASED_OUTPATIENT_CLINIC_OR_DEPARTMENT_OTHER): Payer: Self-pay | Admitting: *Deleted

## 2023-08-22 ENCOUNTER — Ambulatory Visit (INDEPENDENT_AMBULATORY_CARE_PROVIDER_SITE_OTHER): Payer: Medicaid Other | Admitting: Physician Assistant

## 2023-08-22 DIAGNOSIS — Z96641 Presence of right artificial hip joint: Secondary | ICD-10-CM
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
## 2023-09-12 ENCOUNTER — Other Ambulatory Visit: Payer: Self-pay | Admitting: Physician Assistant

## 2023-09-13 ENCOUNTER — Ambulatory Visit: Payer: Medicaid Other

## 2023-09-13 ENCOUNTER — Telehealth: Payer: Self-pay | Admitting: Pharmacist

## 2023-09-13 ENCOUNTER — Ambulatory Visit: Payer: Medicaid Other | Attending: Internal Medicine | Admitting: Internal Medicine

## 2023-09-13 ENCOUNTER — Encounter: Payer: Self-pay | Admitting: Internal Medicine

## 2023-09-13 VITALS — BP 145/81 | HR 59 | Resp 14 | Ht 63.5 in | Wt 108.0 lb

## 2023-09-13 DIAGNOSIS — M170 Bilateral primary osteoarthritis of knee: Secondary | ICD-10-CM

## 2023-09-13 DIAGNOSIS — M059 Rheumatoid arthritis with rheumatoid factor, unspecified: Secondary | ICD-10-CM | POA: Diagnosis not present

## 2023-09-13 DIAGNOSIS — M79641 Pain in right hand: Secondary | ICD-10-CM

## 2023-09-13 DIAGNOSIS — Z79899 Other long term (current) drug therapy: Secondary | ICD-10-CM | POA: Diagnosis not present

## 2023-09-13 DIAGNOSIS — M797 Fibromyalgia: Secondary | ICD-10-CM

## 2023-09-13 DIAGNOSIS — M79642 Pain in left hand: Secondary | ICD-10-CM

## 2023-09-13 MED ORDER — LEFLUNOMIDE 10 MG PO TABS: 10.0000 mg | ORAL_TABLET | Freq: Every morning | ORAL | 0 refills | Status: DC

## 2023-09-13 NOTE — Telephone Encounter (Signed)
 Patient switching care to our clinic d/t transportation.  She is currently on Actemra  162mg  PFS SQ every 14 days.  She is due for next dose on 09/21/2023 (has dose for this) but will need medication by 10/05/23  Please run PA.  She'd like to transfer to our specialty pharmacy  Sherry Pennant, PharmD, MPH, BCPS, CPP Clinical Pharmacist (Rheumatology and Pulmonology)
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
## 2023-09-14 LAB — CBC WITH DIFFERENTIAL/PLATELET
Absolute Lymphocytes: 2217 {cells}/uL (ref 850–3900)
Absolute Monocytes: 469 {cells}/uL (ref 200–950)
Basophils Absolute: 27 {cells}/uL (ref 0–200)
Basophils Relative: 0.4 %
Eosinophils Absolute: 48 {cells}/uL (ref 15–500)
Eosinophils Relative: 0.7 %
HCT: 45.9 % — ABNORMAL HIGH (ref 35.0–45.0)
Hemoglobin: 15.4 g/dL (ref 11.7–15.5)
MCH: 30.1 pg (ref 27.0–33.0)
MCHC: 33.6 g/dL (ref 32.0–36.0)
MCV: 89.6 fL (ref 80.0–100.0)
MPV: 9.9 fL (ref 7.5–12.5)
Monocytes Relative: 6.9 %
Neutro Abs: 4039 {cells}/uL (ref 1500–7800)
Neutrophils Relative %: 59.4 %
Platelets: 359 10*3/uL (ref 140–400)
RBC: 5.12 10*6/uL — ABNORMAL HIGH (ref 3.80–5.10)
RDW: 13 % (ref 11.0–15.0)
Total Lymphocyte: 32.6 %
WBC: 6.8 10*3/uL (ref 3.8–10.8)

## 2023-09-14 LAB — COMPLETE METABOLIC PANEL WITH GFR
AG Ratio: 1.4 (calc) (ref 1.0–2.5)
ALT: 17 U/L (ref 6–29)
AST: 136 U/L — ABNORMAL HIGH (ref 10–35)
Albumin: 4.6 g/dL (ref 3.6–5.1)
Alkaline phosphatase (APISO): 95 U/L (ref 37–153)
BUN: 9 mg/dL (ref 7–25)
CO2: 26 mmol/L (ref 20–32)
Calcium: 9.9 mg/dL (ref 8.6–10.4)
Chloride: 103 mmol/L (ref 98–110)
Creat: 0.67 mg/dL (ref 0.50–1.05)
Globulin: 3.2 g/dL (ref 1.9–3.7)
Glucose, Bld: 79 mg/dL (ref 65–99)
Potassium: 3.6 mmol/L (ref 3.5–5.3)
Sodium: 140 mmol/L (ref 135–146)
Total Bilirubin: 0.2 mg/dL (ref 0.2–1.2)
Total Protein: 7.8 g/dL (ref 6.1–8.1)
eGFR: 98 mL/min/{1.73_m2} (ref >=60)

## 2023-09-14 LAB — SEDIMENTATION RATE: Sed Rate: 6 mm/h (ref 0–30)

## 2023-09-14 LAB — C-REACTIVE PROTEIN: CRP: <3 mg/L (ref <8.0)

## 2023-09-22 ENCOUNTER — Other Ambulatory Visit (HOSPITAL_COMMUNITY): Payer: Self-pay

## 2023-09-22 MED ORDER — ACTEMRA 162 MG/0.9ML ~~LOC~~ SOSY
162.0000 mg | PREFILLED_SYRINGE | SUBCUTANEOUS | 0 refills | Status: DC
  Filled 2023-10-04: qty 5.4, 84d supply, fill #0

## 2023-09-22 NOTE — Telephone Encounter (Signed)
Submitted a Prior Authorization request to [facility] Medicaid for ACTEMRA SQ via CoverMyMeds. Will update once we receive a response.  Key: [id number]   Per automated response: Prior Authorization Not Required  Called [facility] to discontinue Actemra rx there. RTS until 10/03/2023  Rx sent to [facility] today for onboarding  Chesley Mires, PharmD, MPH, BCPS, CPP Clinical Pharmacist (Rheumatology and Pulmonology)

## 2023-09-29 ENCOUNTER — Other Ambulatory Visit: Payer: Self-pay | Admitting: Pharmacist

## 2023-09-29 NOTE — Progress Notes (Signed)
Patient switching care to our clinic d/t transportation.  She is currently on Actemra 162mg  PFS SQ every 14 days.  She'd like to transfer to our specialty pharmacy  Called [facility] to discontinue Actemra rx there. RTS until 10/03/2023  Chesley Mires, PharmD, MPH, BCPS, CPP Clinical Pharmacist (Rheumatology and Pulmonology)

## 2023-10-04 ENCOUNTER — Other Ambulatory Visit: Payer: Self-pay

## 2023-10-04 ENCOUNTER — Other Ambulatory Visit (HOSPITAL_BASED_OUTPATIENT_CLINIC_OR_DEPARTMENT_OTHER): Payer: Self-pay | Admitting: Family Medicine

## 2023-10-04 ENCOUNTER — Other Ambulatory Visit (HOSPITAL_COMMUNITY): Payer: Self-pay

## 2023-10-04 DIAGNOSIS — I10 Essential (primary) hypertension: Secondary | ICD-10-CM

## 2023-10-04 NOTE — Progress Notes (Signed)
 Specialty Pharmacy Initial Fill Coordination Note  Lorraine [name] is a 63 y.o. female contacted today regarding initial fill of specialty medication(s) Tocilizumab (Actemra)   Patient requested Delivery   Delivery date: 10/06/23   Verified address: [address]   [place] [address]   Medication will be filled on 10/05/23.   Patient is aware of $4 copayment. She would like to have her copay billed to [facility] account.

## 2023-10-05 ENCOUNTER — Other Ambulatory Visit: Payer: Self-pay

## 2023-10-19 ENCOUNTER — Ambulatory Visit
Admission: RE | Admit: 2023-10-19 | Discharge: 2023-10-19 | Disposition: A | Discharge status: Home / Self Care | Source: Ambulatory Visit | Attending: Family Medicine | Admitting: Family Medicine

## 2023-10-19 DIAGNOSIS — Z1231 Encounter for screening mammogram for malignant neoplasm of breast: Secondary | ICD-10-CM

## 2023-10-25 ENCOUNTER — Ambulatory Visit (HOSPITAL_BASED_OUTPATIENT_CLINIC_OR_DEPARTMENT_OTHER): Payer: Medicaid Other | Admitting: Family Medicine

## 2023-11-02 ENCOUNTER — Ambulatory Visit (HOSPITAL_BASED_OUTPATIENT_CLINIC_OR_DEPARTMENT_OTHER): Admitting: Family Medicine

## 2023-11-02 ENCOUNTER — Encounter (HOSPITAL_BASED_OUTPATIENT_CLINIC_OR_DEPARTMENT_OTHER): Payer: Self-pay | Admitting: Family Medicine

## 2023-11-02 VITALS — BP 104/75 | HR 75 | Ht 63.0 in | Wt 106.7 lb

## 2023-11-02 DIAGNOSIS — E119 Type 2 diabetes mellitus without complications: Secondary | ICD-10-CM

## 2023-11-02 DIAGNOSIS — N898 Other specified noninflammatory disorders of vagina: Secondary | ICD-10-CM

## 2023-11-02 DIAGNOSIS — I10 Essential (primary) hypertension: Secondary | ICD-10-CM

## 2023-11-02 LAB — POCT GLYCOSYLATED HEMOGLOBIN (HGB A1C)
HbA1c POC (<> result, manual entry): 5.8 % (ref 4.0–5.6)
HbA1c, POC (controlled diabetic range): 5.8 % (ref 0.0–7.0)
HbA1c, POC (prediabetic range): 5.8 % (ref 5.7–6.4)
Hemoglobin A1C: 5.8 % — AB (ref 4.0–5.6)

## 2023-11-02 MED ORDER — FLUCONAZOLE 150 MG PO TABS: 150.0000 mg | ORAL_TABLET | Freq: Once | ORAL | 0 refills | Status: AC

## 2023-11-02 NOTE — Patient Instructions (Signed)
  Medication Instructions:  Your physician recommends that you continue on your current medications as directed. Please refer to the Current Medication list given to you today. --If you need a refill on any your medications before your next appointment, please call your pharmacy first. If no refills are authorized on file call the office.--   Follow-Up: Your next appointment:   Your physician recommends that you schedule a follow-up appointment in: 4-6 month follow up  with Dr. de [place]  You will receive a text message or e-mail with a link to a survey about your care and experience with [place] today! We would greatly appreciate your feedback!   Thanks for letting us be apart of your health journey!!  Primary Care and Sports Medicine   Dr. Ceasar Mons Peru   We encourage you to activate your patient portal called "MyChart".  Sign up information is provided on this After Visit Summary.  MyChart is used to connect with patients for Virtual Visits (Telemedicine).  Patients are able to view lab/test results, encounter notes, upcoming appointments, etc.  Non-urgent messages can be sent to your provider as well. To learn more about what you can do with MyChart, please visit --  [email or web address].

## 2023-11-02 NOTE — Progress Notes (Signed)
    Procedures performed today:    None.  Independent interpretation of notes and tests performed by another provider:   None.  Brief History, Exam, Impression, and Recommendations:    BP 104/75 (BP Location: Right Arm, Patient Position: Sitting, Cuff Size: Normal)   Pulse 75   Ht 5\' 3"  (1.6 m)   Wt 106 lb 11.2 oz (48.4 kg)   SpO2 95%   BMI 18.90 kg/m   Type 2 diabetes mellitus without complication, without long-term current use of insulin (HCC) Assessment & Plan: Recent hemoglobin A1c at goal at 6.1% - POC testing in office today is at goal at 5.8%.  She continues with liraglutide, no issues with medication.  No reported polyuria or polydipsia. Can continue with current medication regimen, no changes made today. She is up-to-date with recommended screenings  Orders: -     POCT glycosylated hemoglobin (Hb A1C)  Essential hypertension Assessment & Plan: Patient reports that she has been doing well.  She continues with amlodipine, chlorthalidone, lisinopril as prescribed.  Blood pressure in office today is at goal.  No reported issues with chest pain or headaches.  She has not been having any new lightheadedness or dizziness. Can continue with current medication regimen.  Recommend intermittent monitoring of blood pressure at home, DASH diet   Vaginal discharge Assessment & Plan: Patient reports that she has been experiencing degree of vaginal discharge.  She indicates history of vaginal yeast infections and current symptoms feel similar.  She denies significant odor, pruritus or other symptoms.  She does not currently follow with OB/GYN.  Reports history of total hysterectomy. Discussed options today.  Patient prefers pelvic exam today.  Can treat initially with fluconazole, however discussed precautions that if symptoms do not respond as expected, recommend having further evaluation and specific testing completed in order to ensure that appropriate treatment is being  utilized.   Other orders -     Fluconazole; Take 1 tablet (150 mg total) by mouth once for 1 dose.  Dispense: 1 tablet; Refill: 0  Return in about 4 months (around 03/03/2024) for diabetes, hypertension.   ___________________________________________ Hudsyn Champine de [place], MD, ABFM, CAQSM Primary Care and Sports Medicine [facility]

## 2023-11-02 NOTE — Assessment & Plan Note (Signed)
Patient reports that she has been doing well.  She continues with amlodipine, chlorthalidone, lisinopril as prescribed.  Blood pressure in office today is at goal.  No reported issues with chest pain or headaches.  She has not been having any new lightheadedness or dizziness. Can continue with current medication regimen.  Recommend intermittent monitoring of blood pressure at home, DASH diet

## 2023-11-02 NOTE — Assessment & Plan Note (Signed)
 Patient reports that she has been experiencing degree of vaginal discharge.  She indicates history of vaginal yeast infections and current symptoms feel similar.  She denies significant odor, pruritus or other symptoms.  She does not currently follow with OB/GYN.  Reports history of total hysterectomy. Discussed options today.  Patient prefers pelvic exam today.  Can treat initially with fluconazole, however discussed precautions that if symptoms do not respond as expected, recommend having further evaluation and specific testing completed in order to ensure that appropriate treatment is being utilized.

## 2023-11-02 NOTE — Assessment & Plan Note (Signed)
 Recent hemoglobin A1c at goal at 6.1% - POC testing in office today is at goal at 5.8%.  She continues with liraglutide, no issues with medication.  No reported polyuria or polydipsia. Can continue with current medication regimen, no changes made today. She is up-to-date with recommended screenings

## 2023-11-03 ENCOUNTER — Other Ambulatory Visit (HOSPITAL_BASED_OUTPATIENT_CLINIC_OR_DEPARTMENT_OTHER): Payer: Self-pay | Admitting: Family Medicine

## 2023-11-03 DIAGNOSIS — Z794 Long term (current) use of insulin: Secondary | ICD-10-CM

## 2023-11-03 DIAGNOSIS — I10 Essential (primary) hypertension: Secondary | ICD-10-CM

## 2023-11-21 ENCOUNTER — Ambulatory Visit: Payer: Medicaid Other | Admitting: Orthopaedic Surgery

## 2023-11-28 ENCOUNTER — Other Ambulatory Visit (HOSPITAL_BASED_OUTPATIENT_CLINIC_OR_DEPARTMENT_OTHER): Payer: Self-pay | Admitting: Family Medicine

## 2023-11-28 ENCOUNTER — Ambulatory Visit (INDEPENDENT_AMBULATORY_CARE_PROVIDER_SITE_OTHER): Admitting: Orthopaedic Surgery

## 2023-11-28 ENCOUNTER — Other Ambulatory Visit (INDEPENDENT_AMBULATORY_CARE_PROVIDER_SITE_OTHER)

## 2023-11-28 DIAGNOSIS — Z96641 Presence of right artificial hip joint: Secondary | ICD-10-CM

## 2023-11-28 MED ORDER — METHOCARBAMOL 500 MG PO TABS: 500.0000 mg | ORAL_TABLET | Freq: Four times a day (QID) | ORAL | 2 refills | Status: DC | PRN
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
## 2023-12-14 ENCOUNTER — Other Ambulatory Visit: Payer: Self-pay

## 2023-12-14 ENCOUNTER — Encounter: Payer: Self-pay | Admitting: Internal Medicine

## 2023-12-14 ENCOUNTER — Ambulatory Visit: Payer: Medicaid Other | Attending: Internal Medicine | Admitting: Internal Medicine

## 2023-12-14 VITALS — BP 124/80 | HR 51 | Resp 14 | Ht 63.0 in | Wt 107.0 lb

## 2023-12-14 DIAGNOSIS — Z79899 Other long term (current) drug therapy: Secondary | ICD-10-CM | POA: Diagnosis present

## 2023-12-14 DIAGNOSIS — M170 Bilateral primary osteoarthritis of knee: Secondary | ICD-10-CM | POA: Diagnosis present

## 2023-12-14 DIAGNOSIS — M059 Rheumatoid arthritis with rheumatoid factor, unspecified: Secondary | ICD-10-CM | POA: Insufficient documentation

## 2023-12-14 MED ORDER — ACTEMRA 162 MG/0.9ML ~~LOC~~ SOSY
162.0000 mg | PREFILLED_SYRINGE | SUBCUTANEOUS | 0 refills | Status: DC
  Filled 2023-12-14 – 2024-01-25 (×2): qty 5.4, 84d supply, fill #0

## 2023-12-14 MED ORDER — LEFLUNOMIDE 10 MG PO TABS: 10.0000 mg | ORAL_TABLET | Freq: Every morning | ORAL | 0 refills | Status: AC
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
## 2023-12-14 NOTE — Patient Instructions (Signed)
 Quadriceps stretch, prone  Lie on your abdomen on a firm surface, such as a bed or padded floor. Bend your left / right knee and hold your ankle. If you cannot reach your ankle or pant leg, loop a belt around your foot and grab the belt instead. Gently pull your heel toward your buttocks. Your knee should not slide out to the side. You should feel a stretch in the front of your thigh and knee (quadriceps). Hold this position for __________ seconds. Repeat __________ times. Complete this exercise __________ times a day.  Strengthening exercises These exercises build strength and endurance in your knee. Endurance is the ability to use your muscles for a long time, even after they get tired. Straight leg raises This exercise strengthens the muscles in front of your thigh (quadriceps) and the muscles that move your hips (hip flexors). Lie on your back with your left / right leg extended and your other knee bent. Tense the muscles in the front of your left / right thigh. You should see your kneecap slide up or see increased dimpling just above the knee. Your thigh may even shake a bit. Keep these muscles tight as you raise your leg 4-6 inches (10-15 cm) off the floor. Do not let your knee bend. Hold this position for __________ seconds. Keep these muscles tense as you lower your leg. Relax your muscles slowly and completely after each repetition. Repeat __________ times. Complete this exercise __________ times a day. Squats This exercise strengthens the muscles in front of your thigh and knee (quadriceps). Stand in front of a table, with your feet and knees pointing straight ahead. You may rest your hands on the table for balance but not for support. Slowly bend your knees and lower your hips like you are going to sit in a chair. Keep your weight over your heels, not over your toes. Keep your lower legs upright so they are parallel with the table legs. Do not let your hips go lower than your  knees. Do not bend lower than told by your health care provider. If your knee pain increases, do not bend as low. Hold the squat position for __________ seconds. Slowly push with your legs to return to standing. Do not use your hands to pull yourself to standing. Repeat __________ times. Complete this exercise __________ times a day. Wall slides This exercise strengthens the muscles in front of your thigh and knee (quadriceps). Lean your back against a smooth wall or door, and walk your feet out 18-24 inches (46-61 cm) from it. Place your feet hip-width apart. Slowly slide down the wall or door until your knees bend __________ degrees. Keep your knees over your heels, not over your toes. Keep your knees in line with your hips. Hold this position for __________ seconds. Repeat __________ times. Complete this exercise __________ times a day. Straight leg raises, side-lying This exercise strengthens the muscles that rotate the leg at the hip and move it away from your body (hip abductors). Lie on your side with your left / right leg in the top position. Lie so your head, shoulder, knee, and hip line up. You may bend your bottom knee to help you keep your balance. Roll your hips slightly forward so your hips are stacked directly over each other and your left / right knee is facing forward. Leading with your heel, lift your top leg 4-6 inches (10-15 cm). You should feel the muscles in your outer hip lifting. Do not let your  foot drift forward. Do not let your knee roll toward the ceiling. Hold this position for __________ seconds. Slowly return your leg to the starting position. Let your muscles relax completely after each repetition. Repeat __________ times. Complete this exercise __________ times a day.

## 2023-12-15 LAB — CBC WITH DIFFERENTIAL/PLATELET
Absolute Lymphocytes: 1840 {cells}/uL (ref 850–3900)
Absolute Monocytes: 407 {cells}/uL (ref 200–950)
Basophils Absolute: 29 {cells}/uL (ref 0–200)
Basophils Relative: 0.7 %
Eosinophils Absolute: 59 {cells}/uL (ref 15–500)
Eosinophils Relative: 1.4 %
HCT: 42.6 % (ref 35.0–45.0)
Hemoglobin: 14 g/dL (ref 11.7–15.5)
MCH: 30.3 pg (ref 27.0–33.0)
MCHC: 32.9 g/dL (ref 32.0–36.0)
MCV: 92.2 fL (ref 80.0–100.0)
MPV: 9.9 fL (ref 7.5–12.5)
Monocytes Relative: 9.7 %
Neutro Abs: 1865 {cells}/uL (ref 1500–7800)
Neutrophils Relative %: 44.4 %
Platelets: 292 10*3/uL (ref 140–400)
RBC: 4.62 10*6/uL (ref 3.80–5.10)
RDW: 13.8 % (ref 11.0–15.0)
Total Lymphocyte: 43.8 %
WBC: 4.2 10*3/uL (ref 3.8–10.8)

## 2023-12-15 LAB — COMPREHENSIVE METABOLIC PANEL WITH GFR
AG Ratio: 1.7 (calc) (ref 1.0–2.5)
ALT: 30 U/L — ABNORMAL HIGH (ref 6–29)
AST: 148 U/L — ABNORMAL HIGH (ref 10–35)
Albumin: 4.4 g/dL (ref 3.6–5.1)
Alkaline phosphatase (APISO): 87 U/L (ref 37–153)
BUN: 11 mg/dL (ref 7–25)
CO2: 33 mmol/L — ABNORMAL HIGH (ref 20–32)
Calcium: 10.1 mg/dL (ref 8.6–10.4)
Chloride: 99 mmol/L (ref 98–110)
Creat: 0.68 mg/dL (ref 0.50–1.05)
Globulin: 2.6 g/dL (ref 1.9–3.7)
Glucose, Bld: 105 mg/dL — ABNORMAL HIGH (ref 65–99)
Potassium: 3.7 mmol/L (ref 3.5–5.3)
Sodium: 138 mmol/L (ref 135–146)
Total Bilirubin: 0.3 mg/dL (ref 0.2–1.2)
Total Protein: 7 g/dL (ref 6.1–8.1)
eGFR: 97 mL/min/{1.73_m2} (ref >=60)

## 2023-12-15 LAB — SEDIMENTATION RATE: Sed Rate: 19 mm/h (ref 0–30)

## 2023-12-22 ENCOUNTER — Other Ambulatory Visit (HOSPITAL_COMMUNITY): Payer: Self-pay

## 2023-12-25 ENCOUNTER — Ambulatory Visit (HOSPITAL_BASED_OUTPATIENT_CLINIC_OR_DEPARTMENT_OTHER): Admitting: Family Medicine

## 2023-12-25 ENCOUNTER — Ambulatory Visit: Payer: Self-pay

## 2023-12-25 ENCOUNTER — Other Ambulatory Visit (HOSPITAL_BASED_OUTPATIENT_CLINIC_OR_DEPARTMENT_OTHER): Payer: Self-pay | Admitting: Family Medicine

## 2023-12-25 ENCOUNTER — Telehealth (HOSPITAL_BASED_OUTPATIENT_CLINIC_OR_DEPARTMENT_OTHER): Payer: Self-pay | Admitting: *Deleted

## 2023-12-25 ENCOUNTER — Other Ambulatory Visit (HOSPITAL_BASED_OUTPATIENT_CLINIC_OR_DEPARTMENT_OTHER): Payer: Self-pay | Admitting: *Deleted

## 2023-12-25 ENCOUNTER — Telehealth (HOSPITAL_BASED_OUTPATIENT_CLINIC_OR_DEPARTMENT_OTHER): Payer: Self-pay | Admitting: Family Medicine

## 2023-12-25 DIAGNOSIS — E119 Type 2 diabetes mellitus without complications: Secondary | ICD-10-CM

## 2023-12-25 DIAGNOSIS — I10 Essential (primary) hypertension: Secondary | ICD-10-CM

## 2023-12-25 MED ORDER — AMLODIPINE BESYLATE 10 MG PO TABS: 10.0000 mg | ORAL_TABLET | Freq: Every day | ORAL | 1 refills | Status: AC

## 2023-12-25 MED ORDER — DROPLET PEN NEEDLES 32G X 4 MM MISC: 1.0000 | 2 refills | Status: DC

## 2023-12-25 MED ORDER — LIRAGLUTIDE 18 MG/3ML ~~LOC~~ SOPN: PEN_INJECTOR | SUBCUTANEOUS | 3 refills | Status: DC

## 2023-12-25 NOTE — Telephone Encounter (Signed)
 LVM to let pt know medication had been sent to pharmacy

## 2023-12-25 NOTE — Telephone Encounter (Signed)
 Copied from CRM [id number]. Topic: Clinical - Red Word Triage >> Dec 25, 2023 10:42 AM Hassie Lint wrote: Red Word that prompted transfer to Nurse Triage: Patient states has been out of her insulin  for 3 weeks. Has not been feeling like herself. Has been feeling very nervous/anxious, as well as being very weak and fatigue.  Chief Complaint: bgl 132 this am; states has been out of insulin  for 3 weeks, anxious Symptoms: anxious Frequency: constant Pertinent Negatives: Patient denies urination pain, frequency, n/v Disposition: [] ED /[] Urgent Care (no appt availability in office) / [x] Appointment(In office/virtual)/ []  [facility]/ [] Home Care/ [] Refused Recommended Disposition /[] [facility]/ []  Follow-up with PCP Additional Notes: apt today; care advice given, denies questions; instructed to go to ER if becomes worse.   Reason for Disposition  [1] Caller has NON-URGENT medication or insulin  pump question AND [2] triager unable to answer question  Answer Assessment - Initial Assessment Questions 1. BLOOD GLUCOSE: "What is your blood glucose level?"      138 2. ONSET: "When did you check the blood glucose?"     This am 3. USUAL RANGE: "What is your glucose level usually?" (e.g., usual fasting morning value, usual evening value)     102-120 4. KETONES: "Do you check for ketones (urine or blood test strips)?" If Yes, ask: "What does the test show now?"      na 5. TYPE 1 or 2:  "Do you know what type of diabetes you have?"  (e.g., Type 1, Type 2, Gestational; doesn't know)      2 6. INSULIN : "Do you take insulin ?" "What type of insulin (s) do you use? What is the mode of delivery? (syringe, pen; injection or pump)?"      yes 7. DIABETES PILLS: "Do you take any pills for your diabetes?" If Yes, ask: "Have you missed taking any pills recently?"     no 8. OTHER SYMPTOMS: "Do you have any symptoms?" (e.g., fever, frequent urination, difficulty breathing, dizziness, weakness,  vomiting)     no 9. PREGNANCY: "Is there any chance you are pregnant?" "When was your last menstrual period?"     na  Protocols used: Diabetes - High Blood Sugar-A-AH

## 2023-12-25 NOTE — Telephone Encounter (Signed)
 Last Fill: 03/22/21  Last OV: 11/02/23 Next OV: 03/04/24  Routing to provider for review/authorization.

## 2023-12-25 NOTE — Telephone Encounter (Unsigned)
 Copied from CRM [id number]. Topic: Clinical - Medication Refill >> Dec 25, 2023  9:42 AM Chuck Crater wrote: Medication: Insulin  Pen Needle 32G X 4 MM  Has the patient contacted their pharmacy? Yes (Agent: If no, request that the patient contact the pharmacy for the refill. If patient does not wish to contact the pharmacy document the reason why and proceed with request.) (Agent: If yes, when and what did the pharmacy advise?) advised they havent heard anything back from doctor  This is the patient's preferred pharmacy:  [facility] [address]   Is this the correct pharmacy for this prescription? Yes If no, delete pharmacy and type the correct one.   Has the prescription been filled recently? Yes  Is the patient out of the medication? Yes been out of it for 3 weeks Has the patient been seen for an appointment in the last year OR does the patient have an upcoming appointment? Yes  Can we respond through MyChart? No  Agent: Please be advised that Rx refills may take up to 3 business days. We ask that you follow-up with your pharmacy.

## 2023-12-25 NOTE — Telephone Encounter (Signed)
 Pen needles sent to pharmacy.

## 2023-12-25 NOTE — Telephone Encounter (Signed)
 Patient needs refill on victoza  currently taking   liraglutide  (VICTOZA ) 18 MG/3ML SOPN    Provider is out of the office please advise

## 2023-12-25 NOTE — Telephone Encounter (Signed)
 Copied from CRM [id number]. Topic: Clinical - Prescription Issue >> Dec 25, 2023  9:46 AM Chuck Crater wrote: Reason for CRM: Patient wants all of her medications moved to [facility] on [address].

## 2023-12-25 NOTE — Telephone Encounter (Signed)
 Spoke with patient. Needed victoza  refilled. Patient has been out of medication for 3 weeks. Sent to [facility] to see if dosage needed to be changed.

## 2023-12-26 ENCOUNTER — Other Ambulatory Visit (HOSPITAL_BASED_OUTPATIENT_CLINIC_OR_DEPARTMENT_OTHER): Payer: Self-pay

## 2023-12-26 ENCOUNTER — Other Ambulatory Visit (HOSPITAL_BASED_OUTPATIENT_CLINIC_OR_DEPARTMENT_OTHER): Payer: Self-pay | Admitting: Family Medicine

## 2023-12-26 ENCOUNTER — Telehealth (HOSPITAL_BASED_OUTPATIENT_CLINIC_OR_DEPARTMENT_OTHER): Payer: Self-pay | Admitting: *Deleted

## 2023-12-26 DIAGNOSIS — E119 Type 2 diabetes mellitus without complications: Secondary | ICD-10-CM

## 2023-12-26 MED ORDER — LIRAGLUTIDE 18 MG/3ML ~~LOC~~ SOPN
PEN_INJECTOR | SUBCUTANEOUS | 3 refills | Status: DC
  Filled 2023-12-26: qty 6, 33d supply, fill #0

## 2023-12-26 NOTE — Telephone Encounter (Signed)
 noted

## 2023-12-26 NOTE — Telephone Encounter (Signed)
 LVM for pt to call the office regarding victoza . Her pharmacy was out of the medication so we have sent this prescription the pharmacy at [facility] and they are getting this medication ready for pick up

## 2023-12-26 NOTE — Telephone Encounter (Signed)
 Copied from CRM [id number]. Topic: General - Other >> Dec 26, 2023  9:54 AM Essie A wrote: Reason for CRM: Patient returned call to office.  Message was given.

## 2024-01-24 ENCOUNTER — Other Ambulatory Visit (HOSPITAL_BASED_OUTPATIENT_CLINIC_OR_DEPARTMENT_OTHER): Payer: Self-pay | Admitting: Family Medicine

## 2024-01-24 DIAGNOSIS — I10 Essential (primary) hypertension: Secondary | ICD-10-CM

## 2024-01-25 ENCOUNTER — Other Ambulatory Visit: Payer: Self-pay

## 2024-01-25 NOTE — Progress Notes (Signed)
 Specialty Pharmacy Refill Coordination Note  Lorraine [name] is a 63 y.o. female contacted today regarding refills of specialty medication(s) Tocilizumab  (Actemra )   Patient requested Delivery   Delivery date: 01/30/24   Verified address: [address]   [place] [address]   Medication will be filled on 01/29/24.

## 2024-02-07 ENCOUNTER — Encounter: Payer: Self-pay | Admitting: Podiatry

## 2024-02-07 ENCOUNTER — Ambulatory Visit (INDEPENDENT_AMBULATORY_CARE_PROVIDER_SITE_OTHER): Admitting: Podiatry

## 2024-02-07 DIAGNOSIS — E1151 Type 2 diabetes mellitus with diabetic peripheral angiopathy without gangrene: Secondary | ICD-10-CM | POA: Diagnosis not present

## 2024-02-07 DIAGNOSIS — M79671 Pain in right foot: Secondary | ICD-10-CM | POA: Diagnosis not present

## 2024-02-07 DIAGNOSIS — B351 Tinea unguium: Secondary | ICD-10-CM

## 2024-02-07 DIAGNOSIS — I70209 Unspecified atherosclerosis of native arteries of extremities, unspecified extremity: Secondary | ICD-10-CM

## 2024-02-07 DIAGNOSIS — M79672 Pain in left foot: Secondary | ICD-10-CM | POA: Diagnosis not present

## 2024-02-07 NOTE — Progress Notes (Signed)
 Patient presents for evaluation and treatment of tenderness and some redness around nails feet.  Tenderness around toes with walking and wearing shoes.  Physical exam:  General appearance: Alert, pleasant, and in no acute distress.  Vascular: Pedal pulses: DP 1/4 B/L, PT 0/4 B/L. Mild edema lower legs bilaterally  Neurological:    Dermatologic:  Nails thickened, disfigured, discolored 1-5 BL with subungual debris.  Redness and hypertrophic nail folds along nail folds bilaterally but no signs of drainage or infection.  Musculoskeletal:     Diagnosis: 1. Painful onychomycotic nails 1 through 5 bilaterally. 2. Pain toes 1 through 5 bilaterally. 3.  Diabetes mellitus type 2 with PVD  Plan: Debrided onychomycotic nails 1 through 5 bilaterally.  Return 3 months [facility]

## 2024-02-27 ENCOUNTER — Other Ambulatory Visit (HOSPITAL_COMMUNITY): Payer: Self-pay

## 2024-03-04 ENCOUNTER — Ambulatory Visit (HOSPITAL_BASED_OUTPATIENT_CLINIC_OR_DEPARTMENT_OTHER): Admitting: Family Medicine

## 2024-03-11 ENCOUNTER — Ambulatory Visit (HOSPITAL_BASED_OUTPATIENT_CLINIC_OR_DEPARTMENT_OTHER): Admitting: Family Medicine

## 2024-03-11 ENCOUNTER — Encounter (HOSPITAL_BASED_OUTPATIENT_CLINIC_OR_DEPARTMENT_OTHER): Payer: Self-pay | Admitting: Family Medicine

## 2024-03-11 VITALS — BP 110/69 | HR 58 | Ht 63.0 in | Wt 103.3 lb

## 2024-03-11 DIAGNOSIS — Z7985 Long-term (current) use of injectable non-insulin antidiabetic drugs: Secondary | ICD-10-CM

## 2024-03-11 DIAGNOSIS — N898 Other specified noninflammatory disorders of vagina: Secondary | ICD-10-CM

## 2024-03-11 DIAGNOSIS — F419 Anxiety disorder, unspecified: Secondary | ICD-10-CM | POA: Insufficient documentation

## 2024-03-11 DIAGNOSIS — I10 Essential (primary) hypertension: Secondary | ICD-10-CM

## 2024-03-11 DIAGNOSIS — E119 Type 2 diabetes mellitus without complications: Secondary | ICD-10-CM | POA: Diagnosis not present

## 2024-03-11 LAB — POCT GLYCOSYLATED HEMOGLOBIN (HGB A1C)
HbA1c POC (<> result, manual entry): 5.7 % (ref 4.0–5.6)
HbA1c, POC (controlled diabetic range): 5.7 % (ref 0.0–7.0)
HbA1c, POC (prediabetic range): 5.7 % (ref 5.7–6.4)
Hemoglobin A1C: 5.7 % — AB (ref 4.0–5.6)

## 2024-03-11 MED ORDER — FLUCONAZOLE 150 MG PO TABS: 150.0000 mg | ORAL_TABLET | Freq: Once | ORAL | 0 refills | Status: AC

## 2024-03-11 MED ORDER — SERTRALINE HCL 25 MG PO TABS
25.0000 mg | ORAL_TABLET | Freq: Every day | ORAL | 1 refills | Status: DC
Start: 2024-03-11 — End: 2024-04-26

## 2024-03-11 NOTE — Assessment & Plan Note (Signed)
 Recent hemoglobin A1c at goal at 5.8% - POC testing in office today is at goal at 5.7%.  She continues with liraglutide , no issues with medication.  No reported polyuria or polydipsia. Can continue with current medication regimen, no changes made today. She is up-to-date with recommended screenings
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
## 2024-03-11 NOTE — Assessment & Plan Note (Signed)
Patient reports that she has been doing well.  She continues with amlodipine, chlorthalidone, lisinopril as prescribed.  Blood pressure in office today is at goal.  No reported issues with chest pain or headaches.  She has not been having any new lightheadedness or dizziness. Can continue with current medication regimen.  Recommend intermittent monitoring of blood pressure at home, DASH diet

## 2024-03-11 NOTE — Progress Notes (Signed)
 Procedures performed today:    None.  Independent interpretation of notes and tests performed by another provider:   None.  Brief History, Exam, Impression, and Recommendations:    BP 110/69 (BP Location: Left Arm, Patient Position: Sitting, Cuff Size: Normal)   Pulse (!) 58   Ht 5' 3 (1.6 m)   Wt 103 lb 4.8 oz (46.9 kg)   SpO2 99%   BMI 18.30 kg/m   Type 2 diabetes mellitus without complication, without long-term current use of insulin  (HCC) Assessment & Plan: Recent hemoglobin A1c at goal at 5.8% - POC testing in office today is at goal at 5.7%.  She continues with liraglutide , no issues with medication.  No reported polyuria or polydipsia. Can continue with current medication regimen, no changes made today. She is up-to-date with recommended screenings  Orders: -     POCT glycosylated hemoglobin (Hb A1C)  Essential hypertension Assessment & Plan: Patient reports that she has been doing well.  She continues with amlodipine , chlorthalidone , lisinopril  as prescribed.  Blood pressure in office today is at goal.  No reported issues with chest pain or headaches.  She has not been having any new lightheadedness or dizziness. Can continue with current medication regimen.  Recommend intermittent monitoring of blood pressure at home, DASH diet   Vaginal discharge Assessment & Plan: Patient reports that she has been experiencing degree of vaginal discharge.  She indicates history of vaginal yeast infections and current symptoms feel similar.  She denies significant odor, pruritus or other symptoms.  She does not currently follow with OB/GYN.  Reports history of total hysterectomy. Discussed options today.  Patient defers pelvic exam today.  Can treat initially with fluconazole , however discussed precautions that if symptoms do not respond as expected, recommend having further evaluation and specific testing completed in order to ensure that appropriate treatment is being  utilized. Given that she has been having recurrent issues, recommend further evaluation with OB/GYN, patient amenable, referral placed today  Orders: -     Ambulatory referral to Obstetrics / Gynecology  Anxiety Assessment & Plan: Patient reports that she has been having increasing concerns related to underlying anxiety.  She feels that she has had symptoms related to anxiety ongoing in the past, however they have never been significantly bothersome in relation to her day-to-day activities.  She has found that she has had more worrying, intrusive thoughts, concerns.  She has not been on any treatment of the past.  She reports that her daughter was diagnosed with anxiety and is currently taking medication.  She does have questions about possibly starting this medication for herself.  She indicates that the medication that her daughter is taking is Zoloft  25 mg.  She also feels that her sleep has been affected as she we will find that she finds herself worrying about various things.  Patient denies any significant change in life circumstances or new stressors that she is aware of. We discussed considerations today and reviewed medication options in relation to symptom control.  I do feel that Zoloft  would be a reasonable option, we discussed that this particular medication, potential side effects, discussed that it can take up to 4 to 6 weeks to see full effect of medication.  We can start with low-dose of medication and plan to follow-up in about 2 weeks to assess progress.  We also discussed role of mindfulness, meditation, counseling.  Advised that if she would like to proceed with counseling, to let us  know we can  place referral   Other orders -     Fluconazole ; Take 1 tablet (150 mg total) by mouth once for 1 dose.  Dispense: 1 tablet; Refill: 0 -     Sertraline  HCl; Take 1 tablet (25 mg total) by mouth daily.  Dispense: 30 tablet; Refill: 1  Return in about 2 weeks (around 03/25/2024) for med  check.   ___________________________________________ Irvine Glorioso de Peru, MD, ABFM, [facility] Sports Medicine [facility]

## 2024-03-11 NOTE — Assessment & Plan Note (Addendum)
 Patient reports that she has been experiencing degree of vaginal discharge.  She indicates history of vaginal yeast infections and current symptoms feel similar.  She denies significant odor, pruritus or other symptoms.  She does not currently follow with OB/GYN.  Reports history of total hysterectomy. Discussed options today.  Patient defers pelvic exam today.  Can treat initially with fluconazole , however discussed precautions that if symptoms do not respond as expected, recommend having further evaluation and specific testing completed in order to ensure that appropriate treatment is being utilized. Given that she has been having recurrent issues, recommend further evaluation with OB/GYN, patient amenable, referral placed today

## 2024-03-11 NOTE — Patient Instructions (Signed)
  Medication Instructions:  Your physician recommends that you continue on your current medications as directed. Please refer to the Current Medication list given to you today. --If you need a refill on any your medications before your next appointment, please call your pharmacy first. If no refills are authorized on file call the office.--  Follow-Up: Your next appointment:   Your physician recommends that you schedule a follow-up appointment in: 2 week follow up  with Dr. de Peru  You will receive a text message or e-mail with a link to a survey about your care and experience with [place] today! We would greatly appreciate your feedback!   Thanks for letting us be apart of your health journey!!  Primary Care and Sports Medicine   Dr. Ceasar Mons Peru   We encourage you to activate your patient portal called "MyChart".  Sign up information is provided on this After Visit Summary.  MyChart is used to connect with patients for Virtual Visits (Telemedicine).  Patients are able to view lab/test results, encounter notes, upcoming appointments, etc.  Non-urgent messages can be sent to your provider as well. To learn more about what you can do with MyChart, please visit --  [email or web address].

## 2024-03-11 NOTE — Assessment & Plan Note (Signed)
 Patient reports that she has been having increasing concerns related to underlying anxiety.  She feels that she has had symptoms related to anxiety ongoing in the past, however they have never been significantly bothersome in relation to her day-to-day activities.  She has found that she has had more worrying, intrusive thoughts, concerns.  She has not been on any treatment of the past.  She reports that her daughter was diagnosed with anxiety and is currently taking medication.  She does have questions about possibly starting this medication for herself.  She indicates that the medication that her daughter is taking is Zoloft  25 mg.  She also feels that her sleep has been affected as she we will find that she finds herself worrying about various things.  Patient denies any significant change in life circumstances or new stressors that she is aware of. We discussed considerations today and reviewed medication options in relation to symptom control.  I do feel that Zoloft  would be a reasonable option, we discussed that this particular medication, potential side effects, discussed that it can take up to 4 to 6 weeks to see full effect of medication.  We can start with low-dose of medication and plan to follow-up in about 2 weeks to assess progress.  We also discussed role of mindfulness, meditation, counseling.  Advised that if she would like to proceed with counseling, to let us  know we can place referral

## 2024-03-20 ENCOUNTER — Other Ambulatory Visit: Payer: Self-pay

## 2024-03-25 ENCOUNTER — Encounter (HOSPITAL_BASED_OUTPATIENT_CLINIC_OR_DEPARTMENT_OTHER): Payer: Self-pay | Admitting: Family Medicine

## 2024-03-25 ENCOUNTER — Ambulatory Visit: Admitting: Internal Medicine

## 2024-03-25 ENCOUNTER — Ambulatory Visit (INDEPENDENT_AMBULATORY_CARE_PROVIDER_SITE_OTHER): Admitting: Family Medicine

## 2024-03-25 VITALS — BP 134/74 | HR 63 | Ht 63.0 in | Wt 100.1 lb

## 2024-03-25 DIAGNOSIS — Z7952 Long term (current) use of systemic steroids: Secondary | ICD-10-CM

## 2024-03-25 DIAGNOSIS — M059 Rheumatoid arthritis with rheumatoid factor, unspecified: Secondary | ICD-10-CM

## 2024-03-25 DIAGNOSIS — E876 Hypokalemia: Secondary | ICD-10-CM

## 2024-03-25 DIAGNOSIS — Z79899 Other long term (current) drug therapy: Secondary | ICD-10-CM

## 2024-03-25 DIAGNOSIS — F419 Anxiety disorder, unspecified: Secondary | ICD-10-CM

## 2024-03-25 DIAGNOSIS — M170 Bilateral primary osteoarthritis of knee: Secondary | ICD-10-CM

## 2024-03-25 MED ORDER — DROPLET PEN NEEDLES 32G X 4 MM MISC: 1.0000 | 2 refills | Status: DC

## 2024-03-25 MED ORDER — POTASSIUM CHLORIDE ER 10 MEQ PO TBCR: 20.0000 meq | EXTENDED_RELEASE_TABLET | Freq: Two times a day (BID) | ORAL | 1 refills | Status: DC

## 2024-03-25 NOTE — Patient Instructions (Signed)
  Medication Instructions:  Your physician recommends that you continue on your current medications as directed. Please refer to the Current Medication list given to you today. --If you need a refill on any your medications before your next appointment, please call your pharmacy first. If no refills are authorized on file call the office.-- Follow-Up: Your next appointment:   Your physician recommends that you schedule a follow-up appointment in: 3-4 week follow up  with Dr. de Peru  You will receive a text message or e-mail with a link to a survey about your care and experience with [place] today! We would greatly appreciate your feedback!   Thanks for letting us be apart of your health journey!!  Primary Care and Sports Medicine   Dr. Ceasar Mons Peru   We encourage you to activate your patient portal called "MyChart".  Sign up information is provided on this After Visit Summary.  MyChart is used to connect with patients for Virtual Visits (Telemedicine).  Patients are able to view lab/test results, encounter notes, upcoming appointments, etc.  Non-urgent messages can be sent to your provider as well. To learn more about what you can do with MyChart, please visit --  [email or web address].

## 2024-03-25 NOTE — Progress Notes (Signed)
    Procedures performed today:    None.  Independent interpretation of notes and tests performed by another provider:   None.  Brief History, Exam, Impression, and Recommendations:    BP 134/74 (BP Location: Left Arm, Patient Position: Sitting, Cuff Size: Normal)   Pulse 63   Ht 5' 3 (1.6 m)   Wt 100 lb 1.6 oz (45.4 kg)   SpO2 97%   BMI 17.73 kg/m   Anxiety Assessment & Plan: Patient continues with sertraline  and reports that she generally feels that she is doing well.  She denies any significant side effects or issues with medication.  She feels that symptoms are not worse and thus thinks symptoms must be at least partially improved. GAD-7 score today of 4, this is improved from last visit a couple weeks ago at which time score was 12. Reviewed considerations in regards to medication management.  Discussed considerations for medication titration, continuing with same dose.  At this time, she would like to continue with same dose and monitor progress in a few weeks.  Feel that this is reasonable.  Will plan for follow-up in about 3 to 4 weeks to assess progress or sooner as needed   Hypokalemia -     Potassium Chloride  ER; Take 2 tablets (20 mEq total) by mouth 2 (two) times daily.  Dispense: 180 tablet; Refill: 1  Other orders -     Droplet Pen Needles; Inject 1 each as directed as directed. USE ONCE A WEEK AS DIRECTED BY DOCTOR  Dispense: 100 each; Refill: 2  Return in about 4 weeks (around 04/22/2024) for med check.   ___________________________________________ Amardeep Beckers de Peru, MD, ABFM, [facility] Primary Care and Sports Medicine [facility]

## 2024-03-25 NOTE — Assessment & Plan Note (Signed)
 Patient continues with sertraline  and reports that she generally feels that she is doing well.  She denies any significant side effects or issues with medication.  She feels that symptoms are not worse and thus thinks symptoms must be at least partially improved. GAD-7 score today of 4, this is improved from last visit a couple weeks ago at which time score was 12. Reviewed considerations in regards to medication management.  Discussed considerations for medication titration, continuing with same dose.  At this time, she would like to continue with same dose and monitor progress in a few weeks.  Feel that this is reasonable.  Will plan for follow-up in about 3 to 4 weeks to assess progress or sooner as needed

## 2024-03-29 ENCOUNTER — Ambulatory Visit: Admitting: Orthopaedic Surgery

## 2024-03-29 ENCOUNTER — Encounter: Payer: Self-pay | Admitting: Orthopaedic Surgery

## 2024-03-29 DIAGNOSIS — Z96651 Presence of right artificial knee joint: Secondary | ICD-10-CM | POA: Diagnosis not present

## 2024-03-29 DIAGNOSIS — M1612 Unilateral primary osteoarthritis, left hip: Secondary | ICD-10-CM | POA: Diagnosis not present

## 2024-03-29 MED ORDER — METHOCARBAMOL 500 MG PO TABS: 500.0000 mg | ORAL_TABLET | Freq: Four times a day (QID) | ORAL | 2 refills | Status: DC | PRN
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
## 2024-04-04 ENCOUNTER — Encounter (HOSPITAL_BASED_OUTPATIENT_CLINIC_OR_DEPARTMENT_OTHER): Admitting: Certified Nurse Midwife

## 2024-04-15 ENCOUNTER — Other Ambulatory Visit (HOSPITAL_BASED_OUTPATIENT_CLINIC_OR_DEPARTMENT_OTHER): Payer: Self-pay | Admitting: *Deleted

## 2024-04-15 ENCOUNTER — Other Ambulatory Visit (HOSPITAL_BASED_OUTPATIENT_CLINIC_OR_DEPARTMENT_OTHER): Payer: Self-pay | Admitting: Family Medicine

## 2024-04-15 ENCOUNTER — Other Ambulatory Visit: Payer: Self-pay

## 2024-04-15 ENCOUNTER — Other Ambulatory Visit: Payer: Self-pay | Admitting: Internal Medicine

## 2024-04-15 DIAGNOSIS — Z111 Encounter for screening for respiratory tuberculosis: Secondary | ICD-10-CM

## 2024-04-15 DIAGNOSIS — M059 Rheumatoid arthritis with rheumatoid factor, unspecified: Secondary | ICD-10-CM

## 2024-04-15 DIAGNOSIS — Z79899 Other long term (current) drug therapy: Secondary | ICD-10-CM

## 2024-04-15 DIAGNOSIS — Z9225 Personal history of immunosupression therapy: Secondary | ICD-10-CM

## 2024-04-15 MED ORDER — ACTEMRA 162 MG/0.9ML ~~LOC~~ SOSY
162.0000 mg | PREFILLED_SYRINGE | SUBCUTANEOUS | 0 refills | Status: AC
Start: 2024-04-15 — End: ?
  Filled 2024-04-15 – 2024-04-17 (×2): qty 1.8, 28d supply, fill #0

## 2024-04-15 MED ORDER — PEN NEEDLES 32G X 4 MM MISC: 2 refills | Status: DC

## 2024-04-15 NOTE — Telephone Encounter (Signed)
 Last Fill: 12/14/2023  Labs: 12/14/2023 Glucose 105 CO2 33 AST 148 ALT 30 Cbc WNL   TB Gold: TB Gold Positive on 12/19/2022   Chest Xray:01/12/2023 No active cardiopulmonary disease. No radiographic evidence of active TB infection.  Next Visit: Due around 03/15/2024 . Message sent to the front to schedule.   Last Visit: 12/14/2023  DX: Rheumatoid arthritis with positive rheumatoid factor, involving unspecified site   Current Dose per office note 12/14/2023:  Actemra  162 mg [place] q14days   Okay to refill Actemra ?  LMOM for patient to update labs and to give the office a call to schedule a follow up.

## 2024-04-15 NOTE — Telephone Encounter (Signed)
 Please schedule patient a follow up visit. Patient due around 03/15/2024 . Thanks!  Contacted the patient to update labs and schedule Follow up

## 2024-04-17 ENCOUNTER — Other Ambulatory Visit: Payer: Self-pay

## 2024-04-19 ENCOUNTER — Other Ambulatory Visit (HOSPITAL_BASED_OUTPATIENT_CLINIC_OR_DEPARTMENT_OTHER): Payer: Self-pay | Admitting: Family Medicine

## 2024-04-19 ENCOUNTER — Other Ambulatory Visit: Payer: Self-pay

## 2024-04-19 DIAGNOSIS — E119 Type 2 diabetes mellitus without complications: Secondary | ICD-10-CM

## 2024-04-26 ENCOUNTER — Encounter (HOSPITAL_BASED_OUTPATIENT_CLINIC_OR_DEPARTMENT_OTHER): Payer: Self-pay | Admitting: Family Medicine

## 2024-04-26 ENCOUNTER — Ambulatory Visit (INDEPENDENT_AMBULATORY_CARE_PROVIDER_SITE_OTHER): Payer: Medicare (Managed Care) | Admitting: Family Medicine

## 2024-04-26 VITALS — BP 106/67 | HR 70 | Ht 63.0 in | Wt 102.6 lb

## 2024-04-26 DIAGNOSIS — E119 Type 2 diabetes mellitus without complications: Secondary | ICD-10-CM

## 2024-04-26 DIAGNOSIS — I10 Essential (primary) hypertension: Secondary | ICD-10-CM

## 2024-04-26 DIAGNOSIS — Z7985 Long-term (current) use of injectable non-insulin antidiabetic drugs: Secondary | ICD-10-CM

## 2024-04-26 DIAGNOSIS — F419 Anxiety disorder, unspecified: Secondary | ICD-10-CM

## 2024-04-26 NOTE — Assessment & Plan Note (Signed)
Patient reports that she has been doing well.  She continues with amlodipine, chlorthalidone, lisinopril as prescribed.  Blood pressure in office today is at goal.  No reported issues with chest pain or headaches.  She has not been having any new lightheadedness or dizziness. Can continue with current medication regimen.  Recommend intermittent monitoring of blood pressure at home, DASH diet

## 2024-04-26 NOTE — Progress Notes (Signed)
    Procedures performed today:    None.  Independent interpretation of notes and tests performed by another provider:   None.  Brief History, Exam, Impression, and Recommendations:    BP 106/67 (BP Location: Right Arm, Patient Position: Sitting, Cuff Size: Normal)   Pulse 70   Ht 5' 3 (1.6 m)   Wt 102 lb 9.6 oz (46.5 kg)   SpO2 98%   BMI 18.17 kg/m   Anxiety Assessment & Plan: Patient previously was taking low-dose of sertraline  and tolerating well, however she reports that a couple weeks ago she did notice some shaking in her hands and felt that may have been related to this medication and so she stopped it.  She does stopping medication, shaking has largely resolved.  In regards to anxiety symptoms since stopping the medication, she feels that symptoms have been doing fairly well, denies any concerns today.  Since initiating sertraline , she has had gradual improvement in GAD-7 screening score.  Today the score is 0. We discussed considerations today and given that she currently feels that symptoms are doing well, we can hold off on alternative pharmacotherapy.  Discussed that if she does note any worsening of symptoms, we can consider alternative SSRI to help with symptom control.  We will plan to follow-up in a few months to assess progress.  If any issues arise before next appointment, she can also return to office sooner   Essential hypertension Assessment & Plan: Patient reports that she has been doing well.  She continues with amlodipine , chlorthalidone , lisinopril  as prescribed.  Blood pressure in office today is at goal.  No reported issues with chest pain or headaches.  She has not been having any new lightheadedness or dizziness. Can continue with current medication regimen.  Recommend intermittent monitoring of blood pressure at home, DASH diet   Type 2 diabetes mellitus without complication, without long-term current use of insulin  (HCC) Assessment & Plan: Recent  hemoglobin A1c at goal at 5.7%.  She continues with liraglutide , no issues with medication.  No reported polyuria or polydipsia. Can continue with current medication regimen, no changes made today. She is up-to-date with recommended screenings.  She will be due for urine ACR and foot exam later this year, we will plan to complete this at next office visit   Return in about 3 months (around 07/26/2024).  Spent 32 minutes on this patient encounter, including preparation, chart review, face-to-face counseling with patient and coordination of care, and documentation of encounter   ___________________________________________ Neal Trulson de Peru, MD, ABFM, [facility] Primary Care and Sports Medicine [facility]

## 2024-04-26 NOTE — Assessment & Plan Note (Signed)
 Patient previously was taking low-dose of sertraline  and tolerating well, however she reports that a couple weeks ago she did notice some shaking in her hands and felt that may have been related to this medication and so she stopped it.  She does stopping medication, shaking has largely resolved.  In regards to anxiety symptoms since stopping the medication, she feels that symptoms have been doing fairly well, denies any concerns today.  Since initiating sertraline , she has had gradual improvement in GAD-7 screening score.  Today the score is 0. We discussed considerations today and given that she currently feels that symptoms are doing well, we can hold off on alternative pharmacotherapy.  Discussed that if she does note any worsening of symptoms, we can consider alternative SSRI to help with symptom control.  We will plan to follow-up in a few months to assess progress.  If any issues arise before next appointment, she can also return to office sooner

## 2024-04-26 NOTE — Assessment & Plan Note (Signed)
 Recent hemoglobin A1c at goal at 5.7%.  She continues with liraglutide , no issues with medication.  No reported polyuria or polydipsia. Can continue with current medication regimen, no changes made today. She is up-to-date with recommended screenings.  She will be due for urine ACR and foot exam later this year, we will plan to complete this at next office visit

## 2024-04-26 NOTE — Patient Instructions (Signed)
 ?  Medication Instructions:  ?Your physician recommends that you continue on your current medications as directed. Please refer to the Current Medication list given to you today. ?--If you need a refill on any your medications before your next appointment, please call your pharmacy first. If no refills are authorized on file call the office.-- ? ? ? ?Follow-Up: ?Your next appointment:   ?Your physician recommends that you schedule a follow-up appointment in: 3 month follow-up with Dr. de Peru ? ?You will receive a text message or e-mail with a link to a survey about your care and experience with [place] today! We would greatly appreciate your feedback!  ? ?Thanks for letting us be apart of your health journey!!  ?Primary Care and Sports Medicine  ? ?Dr. Marcy Salvo de Peru  ? ?We encourage you to activate your patient portal called "MyChart".  Sign up information is provided on this After Visit Summary.  MyChart is used to connect with patients for Virtual Visits (Telemedicine).  Patients are able to view lab/test results, encounter notes, upcoming appointments, etc.  Non-urgent messages can be sent to your provider as well. To learn more about what you can do with MyChart, please visit --  [email or web address].    ?

## 2024-05-06 ENCOUNTER — Other Ambulatory Visit (HOSPITAL_BASED_OUTPATIENT_CLINIC_OR_DEPARTMENT_OTHER): Payer: Self-pay | Admitting: Family Medicine

## 2024-05-07 ENCOUNTER — Other Ambulatory Visit (HOSPITAL_BASED_OUTPATIENT_CLINIC_OR_DEPARTMENT_OTHER): Payer: Self-pay | Admitting: Family Medicine

## 2024-05-07 NOTE — Telephone Encounter (Signed)
 LVM for patient to call the office in regards to medication

## 2024-05-08 ENCOUNTER — Ambulatory Visit (INDEPENDENT_AMBULATORY_CARE_PROVIDER_SITE_OTHER): Payer: Medicare (Managed Care) | Admitting: Podiatry

## 2024-05-08 ENCOUNTER — Encounter: Payer: Self-pay | Admitting: Podiatry

## 2024-05-08 DIAGNOSIS — M79671 Pain in right foot: Secondary | ICD-10-CM | POA: Diagnosis not present

## 2024-05-08 DIAGNOSIS — M79672 Pain in left foot: Secondary | ICD-10-CM

## 2024-05-08 DIAGNOSIS — B351 Tinea unguium: Secondary | ICD-10-CM

## 2024-05-08 NOTE — Progress Notes (Signed)
 Patient presents for evaluation and treatment of tenderness and some redness around nails feet.  Tenderness around toes with walking and wearing shoes.  Physical exam:  General appearance: Alert, pleasant, and in no acute distress.  Vascular: Pedal pulses: DP 1/4 B/L, PT 0/4 B/L. Mild edema lower legs bilaterally  Neu  Dermatologic:  Nails thickened, disfigured, discolored 1-5 BL with subungual debris.  Redness and hypertrophic nail folds along nail folds bilaterally but no signs of drainage or infection.  Musculoskeletal:     Diagnosis: 1. Painful onychomycotic nails 1 through 5 bilaterally. 2. Pain toes 1 through 5 bilaterally.  Plan: -Debrided onychomycotic nails 1 through 5 bilaterally.  Sharply debrided nails with nail clipper and reduced with a power bur.  Return 3 months [facility]

## 2024-06-15 ENCOUNTER — Other Ambulatory Visit: Payer: Self-pay | Admitting: Internal Medicine

## 2024-06-15 DIAGNOSIS — M059 Rheumatoid arthritis with rheumatoid factor, unspecified: Secondary | ICD-10-CM

## 2024-06-26 ENCOUNTER — Other Ambulatory Visit: Payer: Self-pay

## 2024-06-28 ENCOUNTER — Other Ambulatory Visit (HOSPITAL_BASED_OUTPATIENT_CLINIC_OR_DEPARTMENT_OTHER): Payer: Self-pay | Admitting: Family Medicine

## 2024-06-28 DIAGNOSIS — I10 Essential (primary) hypertension: Secondary | ICD-10-CM

## 2024-07-02 ENCOUNTER — Encounter (HOSPITAL_BASED_OUTPATIENT_CLINIC_OR_DEPARTMENT_OTHER): Payer: Self-pay

## 2024-07-02 ENCOUNTER — Ambulatory Visit (INDEPENDENT_AMBULATORY_CARE_PROVIDER_SITE_OTHER): Payer: Medicare (Managed Care) | Admitting: *Deleted

## 2024-07-02 VITALS — Ht 63.0 in | Wt 102.0 lb

## 2024-07-02 DIAGNOSIS — Z0000 Encounter for general adult medical examination without abnormal findings: Secondary | ICD-10-CM | POA: Diagnosis not present

## 2024-07-02 NOTE — Patient Instructions (Signed)
 Lorraine [name],  Thank you for taking the time for your Medicare Wellness Visit. I appreciate your continued commitment to your health goals. Please review the care plan we discussed, and feel free to reach out if I can assist you further.  Please note that Annual Wellness Visits do not include a physical exam. Some assessments may be limited, especially if the visit was conducted virtually. If needed, we may recommend an in-person follow-up with your provider.  Ongoing Care Seeing your primary care provider every 3 to 6 months helps us  monitor your health and provide consistent, personalized care.   Referrals If a referral was made during today's visit and you haven't received any updates within two weeks, please contact the referred provider directly to check on the status.  Recommended Screenings:  Health Maintenance  Topic Date Due   Pneumococcal Vaccine for age over 64 (3 of 3 - PCV20 or PCV21) 05/29/2024   Yearly kidney health urinalysis for diabetes  06/26/2024   Lipid (cholesterol) test  06/26/2024   Hemoglobin A1C  06/11/2024   Complete foot exam   06/26/2024   COVID-19 Vaccine (5 - 2025-26 season) 07/18/2024*   Flu Shot  11/05/2024*   Eye exam for diabetics  07/25/2024   Yearly kidney function blood test for diabetes  12/13/2024   Colon Cancer Screening  03/16/2025   Medicare Annual Wellness Visit  07/02/2025   Breast Cancer Screening  10/18/2025   DTaP/Tdap/Td vaccine (4 - Td or Tdap) 09/18/2031   Hepatitis B Vaccine  Completed   Osteoporosis screening with Bone Density Scan  Completed   Hepatitis C Screening  Completed   HIV Screening  Completed   Meningitis B Vaccine  Aged Out   Zoster (Shingles) Vaccine  Discontinued  *Topic was postponed. The date shown is not the original due date.       07/02/2024    8:26 AM  Advanced Directives  Does Patient Have a Medical Advance Directive? No  Would patient like information on creating a medical advance directive? No -  Patient declined    Vision: Annual vision screenings are recommended for early detection of glaucoma, cataracts, and diabetic retinopathy. These exams can also reveal signs of chronic conditions such as diabetes and high blood pressure.  Dental: Annual dental screenings help detect early signs of oral cancer, gum disease, and other conditions linked to overall health, including heart disease and diabetes.  Please see the attached documents for additional preventive care recommendations.    Lorraine [name] , Thank you for taking time to come for your Medicare Wellness Visit. I appreciate your ongoing commitment to your health goals. Please review the following plan we discussed and let me know if I can assist you in the future.   Screening recommendations/referrals: Colonoscopy:  Mammogram:  Bone Density:  Recommended yearly ophthalmology/optometry visit for glaucoma screening and checkup Recommended yearly dental visit for hygiene and checkup  Vaccinations: Influenza vaccine:  Pneumococcal vaccine:  Tdap vaccine:  Shingles vaccine:       Preventive Care 65 Years and Older, Female Preventive care refers to lifestyle choices and visits with your health care provider that can promote health and wellness. What does preventive care include? A yearly physical exam. This is also called an annual well check. Dental exams once or twice a year. Routine eye exams. Ask your health care provider how often you should have your eyes checked. Personal lifestyle choices, including: Daily care of your teeth and gums. Regular physical activity. Eating a healthy  diet. Avoiding tobacco and drug use. Limiting alcohol  use. Practicing safe sex. Taking low-dose aspirin  every day. Taking vitamin and mineral supplements as recommended by your health care provider. What happens during an annual well check? The services and screenings done by your health care provider during your annual well check will  depend on your age, overall health, lifestyle risk factors, and family history of disease. Counseling  Your health care provider may ask you questions about your: Alcohol  use. Tobacco use. Drug use. Emotional well-being. Home and relationship well-being. Sexual activity. Eating habits. History of falls. Memory and ability to understand (cognition). Work and work astronomer. Reproductive health. Screening  You may have the following tests or measurements: Height, weight, and BMI. Blood pressure. Lipid and cholesterol levels. These may be checked every 5 years, or more frequently if you are over 88 years old. Skin check. Lung cancer screening. You may have this screening every year starting at age 67 if you have a 30-pack-year history of smoking and currently smoke or have quit within the past 15 years. Fecal occult blood test (FOBT) of the stool. You may have this test every year starting at age 28. Flexible sigmoidoscopy or colonoscopy. You may have a sigmoidoscopy every 5 years or a colonoscopy every 10 years starting at age 38. Hepatitis C blood test. Hepatitis B blood test. Sexually transmitted disease (STD) testing. Diabetes screening. This is done by checking your blood sugar (glucose) after you have not eaten for a while (fasting). You may have this done every 1-3 years. Bone density scan. This is done to screen for osteoporosis. You may have this done starting at age 23. Mammogram. This may be done every 1-2 years. Talk to your health care provider about how often you should have regular mammograms. Talk with your health care provider about your test results, treatment options, and if necessary, the need for more tests. Vaccines  Your health care provider may recommend certain vaccines, such as: Influenza vaccine. This is recommended every year. Tetanus, diphtheria, and acellular pertussis (Tdap, Td) vaccine. You may need a Td booster every 10 years. Zoster vaccine. You may  need this after age 44. Pneumococcal 13-valent conjugate (PCV13) vaccine. One dose is recommended after age 61. Pneumococcal polysaccharide (PPSV23) vaccine. One dose is recommended after age 73. Talk to your health care provider about which screenings and vaccines you need and how often you need them. This information is not intended to replace advice given to you by your health care provider. Make sure you discuss any questions you have with your health care provider. Document Released: 08/21/2015 Document Revised: 04/13/2016 Document Reviewed: 05/26/2015 Elsevier Interactive Patient Education  2017 [employer].  Fall Prevention in the Home Falls can cause injuries. They can happen to people of all ages. There are many things you can do to make your home safe and to help prevent falls. What can I do on the outside of my home? Regularly fix the edges of walkways and driveways and fix any cracks. Remove anything that might make you trip as you walk through a door, such as a raised step or threshold. Trim any bushes or trees on the path to your home. Use bright outdoor lighting. Clear any walking paths of anything that might make someone trip, such as rocks or tools. Regularly check to see if handrails are loose or broken. Make sure that both sides of any steps have handrails. Any raised decks and porches should have guardrails on the edges. Have any  leaves, snow, or ice cleared regularly. Use sand or salt on walking paths during winter. Clean up any spills in your garage right away. This includes oil or grease spills. What can I do in the bathroom? Use night lights. Install grab bars by the toilet and in the tub and shower. Do not use towel bars as grab bars. Use non-skid mats or decals in the tub or shower. If you need to sit down in the shower, use a plastic, non-slip stool. Keep the floor dry. Clean up any water that spills on the floor as soon as it happens. Remove soap buildup in  the tub or shower regularly. Attach bath mats securely with double-sided non-slip rug tape. Do not have throw rugs and other things on the floor that can make you trip. What can I do in the bedroom? Use night lights. Make sure that you have a light by your bed that is easy to reach. Do not use any sheets or blankets that are too big for your bed. They should not hang down onto the floor. Have a firm chair that has side arms. You can use this for support while you get dressed. Do not have throw rugs and other things on the floor that can make you trip. What can I do in the kitchen? Clean up any spills right away. Avoid walking on wet floors. Keep items that you use a lot in easy-to-reach places. If you need to reach something above you, use a strong step stool that has a grab bar. Keep electrical cords out of the way. Do not use floor polish or wax that makes floors slippery. If you must use wax, use non-skid floor wax. Do not have throw rugs and other things on the floor that can make you trip. What can I do with my stairs? Do not leave any items on the stairs. Make sure that there are handrails on both sides of the stairs and use them. Fix handrails that are broken or loose. Make sure that handrails are as long as the stairways. Check any carpeting to make sure that it is firmly attached to the stairs. Fix any carpet that is loose or worn. Avoid having throw rugs at the top or bottom of the stairs. If you do have throw rugs, attach them to the floor with carpet tape. Make sure that you have a light switch at the top of the stairs and the bottom of the stairs. If you do not have them, ask someone to add them for you. What else can I do to help prevent falls? Wear shoes that: Do not have high heels. Have rubber bottoms. Are comfortable and fit you well. Are closed at the toe. Do not wear sandals. If you use a stepladder: Make sure that it is fully opened. Do not climb a closed  stepladder. Make sure that both sides of the stepladder are locked into place. Ask someone to hold it for you, if possible. Clearly mark and make sure that you can see: Any grab bars or handrails. First and last steps. Where the edge of each step is. Use tools that help you move around (mobility aids) if they are needed. These include: Canes. Walkers. Scooters. Crutches. Turn on the lights when you go into a dark area. Replace any light bulbs as soon as they burn out. Set up your furniture so you have a clear path. Avoid moving your furniture around. If any of your floors are uneven, fix them. If  there are any pets around you, be aware of where they are. Review your medicines with your doctor. Some medicines can make you feel dizzy. This can increase your chance of falling. Ask your doctor what other things that you can do to help prevent falls. This information is not intended to replace advice given to you by your health care provider. Make sure you discuss any questions you have with your health care provider. Document Released: 05/21/2009 Document Revised: 12/31/2015 Document Reviewed: 08/29/2014 Elsevier Interactive Patient Education  2017 [employer].
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
## 2024-07-19 ENCOUNTER — Other Ambulatory Visit (HOSPITAL_BASED_OUTPATIENT_CLINIC_OR_DEPARTMENT_OTHER): Payer: Self-pay | Admitting: Family Medicine

## 2024-07-19 ENCOUNTER — Other Ambulatory Visit: Payer: Self-pay | Admitting: Orthopaedic Surgery

## 2024-07-19 ENCOUNTER — Other Ambulatory Visit: Payer: Self-pay | Admitting: Internal Medicine

## 2024-07-19 DIAGNOSIS — M059 Rheumatoid arthritis with rheumatoid factor, unspecified: Secondary | ICD-10-CM

## 2024-07-23 ENCOUNTER — Other Ambulatory Visit (HOSPITAL_BASED_OUTPATIENT_CLINIC_OR_DEPARTMENT_OTHER): Payer: Self-pay | Admitting: *Deleted

## 2024-07-23 DIAGNOSIS — E119 Type 2 diabetes mellitus without complications: Secondary | ICD-10-CM

## 2024-07-23 NOTE — Progress Notes (Unsigned)
 Refill request received from pt's pharmacy for liraglutide . Dr. De Cuba, please advise on instructions/dosage that needs to be sent to pharmacy for pt.

## 2024-07-26 ENCOUNTER — Ambulatory Visit (INDEPENDENT_AMBULATORY_CARE_PROVIDER_SITE_OTHER): Payer: Medicare (Managed Care) | Admitting: Family Medicine

## 2024-07-26 VITALS — BP 130/79 | HR 67 | Temp 98.1°F | Resp 18 | Ht 63.0 in | Wt 109.0 lb

## 2024-07-26 DIAGNOSIS — E119 Type 2 diabetes mellitus without complications: Secondary | ICD-10-CM

## 2024-07-26 DIAGNOSIS — Z7985 Long-term (current) use of injectable non-insulin antidiabetic drugs: Secondary | ICD-10-CM

## 2024-07-26 DIAGNOSIS — E785 Hyperlipidemia, unspecified: Secondary | ICD-10-CM | POA: Diagnosis not present

## 2024-07-26 DIAGNOSIS — I10 Essential (primary) hypertension: Secondary | ICD-10-CM

## 2024-07-26 NOTE — Assessment & Plan Note (Signed)
Patient reports that she has been doing well.  She continues with amlodipine, chlorthalidone, lisinopril as prescribed.  Blood pressure in office today is at goal.  No reported issues with chest pain or headaches.  She has not been having any new lightheadedness or dizziness. Can continue with current medication regimen.  Recommend intermittent monitoring of blood pressure at home, DASH diet

## 2024-07-26 NOTE — Assessment & Plan Note (Signed)
 Recent hemoglobin A1c at goal.  She continues with liraglutide , no issues with medication.  No reported polyuria or polydipsia. Can continue with current medication regimen, no changes made today. She is up-to-date with recommended screenings. Urine ACR due, we will check this today Will also check A1c today Foot exam completed today, documented in chart

## 2024-07-26 NOTE — Assessment & Plan Note (Signed)
 Patient not currently on statin therapy.  Last lipid panel about 1 year ago, was borderline controlled at that time.  We will proceed with recheck today as she is fasting.  Further recommendations pending results of cholesterol panel.

## 2024-07-26 NOTE — Progress Notes (Signed)
" ° ° °  Procedures performed today:    None.  Independent interpretation of notes and tests performed by another provider:   None.  Brief History, Exam, Impression, and Recommendations:    BP 130/79 (BP Location: Left Arm, Patient Position: Sitting, Cuff Size: Normal)   Pulse 67   Temp 98.1 F (36.7 C) (Oral)   Resp 18   Ht 5' 3 (1.6 m)   Wt 109 lb (49.4 kg)   SpO2 100%   BMI 19.31 kg/m   Type 2 diabetes mellitus without complication, without long-term current use of insulin  (HCC) Assessment & Plan: Recent hemoglobin A1c at goal.  She continues with liraglutide , no issues with medication.  No reported polyuria or polydipsia. Can continue with current medication regimen, no changes made today. She is up-to-date with recommended screenings. Urine ACR due, we will check this today Will also check A1c today Foot exam completed today, documented in chart  Orders: -     Microalbumin / creatinine urine ratio -     Hemoglobin A1c  Essential hypertension Assessment & Plan: Patient reports that she has been doing well.  She continues with amlodipine , chlorthalidone , lisinopril  as prescribed.  Blood pressure in office today is at goal.  No reported issues with chest pain or headaches.  She has not been having any new lightheadedness or dizziness. Can continue with current medication regimen.  Recommend intermittent monitoring of blood pressure at home, DASH diet   Hyperlipidemia, unspecified hyperlipidemia type Assessment & Plan: Patient not currently on statin therapy.  Last lipid panel about 1 year ago, was borderline controlled at that time.  We will proceed with recheck today as she is fasting.  Further recommendations pending results of cholesterol panel.  Orders: -     Lipid panel  Return in about 4 months (around 11/24/2024) for diabetes, hypertension.   ___________________________________________ Tameko Halder de Cuba, MD, ABFM, CAQSM Primary Care and Sports Medicine [facility] "

## 2024-07-27 LAB — LIPID PANEL
Chol/HDL Ratio: 2.3 ratio (ref 0.0–4.4)
Cholesterol, Total: 223 mg/dL — ABNORMAL HIGH (ref 100–199)
HDL: 96 mg/dL
LDL Chol Calc (NIH): 112 mg/dL — ABNORMAL HIGH (ref 0–99)
Triglycerides: 89 mg/dL (ref 0–149)
VLDL Cholesterol Cal: 15 mg/dL (ref 5–40)

## 2024-07-27 LAB — HEMOGLOBIN A1C
Est. average glucose Bld gHb Est-mCnc: 117 mg/dL
Hgb A1c MFr Bld: 5.7 % — ABNORMAL HIGH (ref 4.8–5.6)

## 2024-07-27 LAB — MICROALBUMIN / CREATININE URINE RATIO
Creatinine, Urine: 46.3 mg/dL
Microalb/Creat Ratio: 21 mg/g{creat} (ref 0–29)
Microalbumin, Urine: 9.7 ug/mL

## 2024-07-29 ENCOUNTER — Ambulatory Visit (HOSPITAL_BASED_OUTPATIENT_CLINIC_OR_DEPARTMENT_OTHER): Payer: Self-pay | Admitting: Family Medicine

## 2024-07-30 ENCOUNTER — Ambulatory Visit (INDEPENDENT_AMBULATORY_CARE_PROVIDER_SITE_OTHER): Payer: Medicare (Managed Care) | Admitting: Orthopaedic Surgery

## 2024-07-30 ENCOUNTER — Telehealth: Payer: Self-pay

## 2024-07-30 ENCOUNTER — Other Ambulatory Visit: Payer: Medicare (Managed Care)

## 2024-07-30 DIAGNOSIS — M1612 Unilateral primary osteoarthritis, left hip: Secondary | ICD-10-CM

## 2024-07-30 DIAGNOSIS — Z96641 Presence of right artificial hip joint: Secondary | ICD-10-CM
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
## 2024-07-30 NOTE — Telephone Encounter (Signed)
 Patient given PCP surgical clearance form. Aware that we must receive clearance form back before being able to proceed with scheduling surgery.

## 2024-08-05 LAB — OPHTHALMOLOGY REPORT-SCANNED

## 2024-08-06 ENCOUNTER — Telehealth (HOSPITAL_BASED_OUTPATIENT_CLINIC_OR_DEPARTMENT_OTHER): Payer: Self-pay

## 2024-08-06 MED ORDER — ATORVASTATIN CALCIUM 10 MG PO TABS: 10.0000 mg | ORAL_TABLET | Freq: Every day | ORAL | 1 refills | Status: AC

## 2024-08-06 NOTE — Telephone Encounter (Signed)
 Patient has agreed to start medication for cholesterol. Please send script to [facility].

## 2024-08-06 NOTE — Addendum Note (Signed)
 Addended by: DE CUBA, Vandy Fong J on: 08/06/2024 03:57 PM   Modules accepted: Orders

## 2024-08-09 ENCOUNTER — Ambulatory Visit (INDEPENDENT_AMBULATORY_CARE_PROVIDER_SITE_OTHER): Payer: Medicare (Managed Care) | Admitting: Podiatry

## 2024-08-09 ENCOUNTER — Encounter: Payer: Self-pay | Admitting: Podiatry

## 2024-08-09 DIAGNOSIS — B351 Tinea unguium: Secondary | ICD-10-CM | POA: Diagnosis not present

## 2024-08-09 DIAGNOSIS — M79672 Pain in left foot: Secondary | ICD-10-CM

## 2024-08-09 DIAGNOSIS — M79671 Pain in right foot: Secondary | ICD-10-CM

## 2024-08-09 NOTE — Progress Notes (Signed)
 Patient presents for evaluation and treatment of tenderness and some redness around nails feet.  Tenderness around toes with walking and wearing shoes.  Physical exam:  General appearance: Alert, pleasant, and in no acute distress.  Vascular: Pedal pulses: DP 1/4 B/L, PT 0/4 B/L. Mild edema lower legs bilaterally.  Capillary refill time immediate bilaterally  Neurologic:  Dermatologic:  Nails thickened, disfigured, discolored 1-5 BL with subungual debris.  Redness and hypertrophic nail folds along nail folds bilaterally but no signs of drainage or infection.  Musculoskeletal:     Diagnosis: 1. Painful onychomycotic nails 1 through 5 bilaterally. 2. Pain toes 1 through 5 bilaterally.  Plan: -Debrided onychomycotic nails 1 through 5 bilaterally.  Sharply debrided nails with nail clipper and reduced with a power bur.  Return 3 months [facility]

## 2024-08-13 DIAGNOSIS — M059 Rheumatoid arthritis with rheumatoid factor, unspecified: Secondary | ICD-10-CM

## 2024-08-15 ENCOUNTER — Ambulatory Visit (INDEPENDENT_AMBULATORY_CARE_PROVIDER_SITE_OTHER): Payer: Medicare (Managed Care) | Admitting: Family Medicine

## 2024-08-15 ENCOUNTER — Encounter (HOSPITAL_BASED_OUTPATIENT_CLINIC_OR_DEPARTMENT_OTHER): Payer: Self-pay | Admitting: Family Medicine

## 2024-08-15 VITALS — BP 126/88 | HR 65 | Temp 98.2°F | Resp 18 | Ht 63.0 in | Wt 111.3 lb

## 2024-08-15 DIAGNOSIS — E785 Hyperlipidemia, unspecified: Secondary | ICD-10-CM | POA: Diagnosis not present

## 2024-08-15 DIAGNOSIS — Z01818 Encounter for other preprocedural examination: Secondary | ICD-10-CM

## 2024-08-15 NOTE — Progress Notes (Signed)
" ° ° °  Procedures performed today:    None.  Independent interpretation of notes and tests performed by another provider:   None.  Brief History, Exam, Impression, and Recommendations:    Lorraine [name] is a 64 y.o. presenting for preoperative evaluation/clearance. The planned procedure is left hip arthroplasty with the treatment goal of reduced pain and improved function. Cardiac risk for planned procedure is Intermediate (1 to 5%) - intraperitoneal or intrathoracic surgery, carotid endarterectomy, head and neck surgery, orthopedic surgery, prostate surgery  Signs or symptoms of cardiovascular disease? No New or unstable cardiopulmonary signs or symptoms? No Urinary symptoms or invasive urologic procedure? No  BP 126/88 (BP Location: Left Arm, Patient Position: Sitting, Cuff Size: Normal)   Pulse 65   Temp 98.2 F (36.8 C) (Oral)   Resp 18   Ht 5' 3 (1.6 m)   Wt 111 lb 4.8 oz (50.5 kg)   SpO2 98%   BMI 19.72 kg/m   Exam: Patient is in no acute distress, vital signs stable Cardiovascular exam with regular rate and rhythm, no murmur appreciated Lungs clear to auscultation bilaterally  Preoperative clearance Based on history and exam will order the following preoperative tests: none - had A1c completed a few weeks ago and this remains well-controlled. Patient is medically cleared to proceed with planned surgery.  HLD (hyperlipidemia) Patient did start with statin, has been taking for about 1 week, denies any side effects or issues with medication. Will plan to recheck cholesterol panel before next appointment in April to assess progress with current dose of statin  Return if symptoms worsen or fail to improve, for has appt in April, keep this appt.   ___________________________________________ Mehul Rudin de Cuba, MD, ABFM, CAQSM Primary Care and Sports Medicine [facility] "

## 2024-08-15 NOTE — Assessment & Plan Note (Signed)
 Patient did start with statin, has been taking for about 1 week, denies any side effects or issues with medication. Will plan to recheck cholesterol panel before next appointment in April to assess progress with current dose of statin

## 2024-08-15 NOTE — Assessment & Plan Note (Signed)
 Based on history and exam will order the following preoperative tests: none - had A1c completed a few weeks ago and this remains well-controlled. Patient is medically cleared to proceed with planned surgery.

## 2024-08-21 ENCOUNTER — Other Ambulatory Visit: Payer: Self-pay

## 2024-09-03 ENCOUNTER — Other Ambulatory Visit (HOSPITAL_BASED_OUTPATIENT_CLINIC_OR_DEPARTMENT_OTHER): Payer: Self-pay | Admitting: Family Medicine

## 2024-09-03 DIAGNOSIS — E876 Hypokalemia: Secondary | ICD-10-CM

## 2024-09-10 ENCOUNTER — Ambulatory Visit: Payer: Self-pay

## 2024-09-10 NOTE — Telephone Encounter (Signed)
 FYI Only or Action Required?: Action required by provider: clinical question for provider.  Patient was last seen in primary care on 08/15/2024 by de Cuba, Quintin PARAS, MD.  Called Nurse Triage reporting Vaginitis.  Symptoms began several days ago.  Interventions attempted: Nothing.  Symptoms are: stable.  Triage Disposition: See PCP When Office is Open (Within 3 Days)  Patient/caregiver understands and will follow disposition?: Yes  Copied from CRM #[id number]. Topic: Clinical - Medical Advice >> Sep 10, 2024  3:17 PM Lorraine [name] wrote: Reason for CRM: Pt is requesting medication for a yeast infection. She is experiencing itching. Pt is diabetic and she gets these every now and then. Please call pt back on #[phone] Reason for Disposition  [1] Vaginal itching AND [2] not improved > 3 days following Care Advice  Answer Assessment - Initial Assessment Questions Pt states gets yeast infection often from DM, pt normally takes Diflucan  tabs, pt unable to come in d/t weather currently.   1. SYMPTOM: What's the main symptom you're concerned about? (e.g., pain, itching, dryness)     Vaginal itching and irritation  2. LOCATION: Where is the  sx located? (e.g., inside/outside, left/right)     Outside  3. ONSET: When did the  sx  start?     Several days ago  4. PAIN: Is there any pain? If Yes, ask: How bad is it? (Scale: 1-10; mild, moderate, severe)     No pain  5. ITCHING: Is there any itching? If Yes, ask: How bad is it? (Scale: 1-10; mild, moderate, severe)     Moderate  6. CAUSE: What do you think is causing the discharge? Have you had the same problem before? What happened then?     Yeast infection d/t DM  7. OTHER SYMPTOMS: Do you have any other symptoms? (e.g., fever, itching, vaginal bleeding, pain with urination, injury to genital area, vaginal foreign body)     Slight discharge  Protocols used: Vaginal Symptoms-A-AH
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
## 2024-09-11 NOTE — Assessment & Plan Note (Signed)
 Lorraine [name]

## 2024-09-20 ENCOUNTER — Ambulatory Visit: Payer: Medicare (Managed Care) | Admitting: Internal Medicine

## 2024-09-20 DIAGNOSIS — M170 Bilateral primary osteoarthritis of knee: Secondary | ICD-10-CM

## 2024-09-20 DIAGNOSIS — Z79899 Other long term (current) drug therapy: Secondary | ICD-10-CM

## 2024-09-20 DIAGNOSIS — M059 Rheumatoid arthritis with rheumatoid factor, unspecified: Secondary | ICD-10-CM

## 2024-11-07 ENCOUNTER — Ambulatory Visit: Payer: Medicare (Managed Care) | Admitting: Podiatry

## 2024-11-25 ENCOUNTER — Ambulatory Visit (HOSPITAL_BASED_OUTPATIENT_CLINIC_OR_DEPARTMENT_OTHER): Payer: Medicare (Managed Care) | Admitting: Family Medicine

## 2025-07-29 ENCOUNTER — Ambulatory Visit (HOSPITAL_BASED_OUTPATIENT_CLINIC_OR_DEPARTMENT_OTHER): Payer: Medicare (Managed Care)
